# Patient Record
Sex: Female | Born: 1960
Health system: Southern US, Community
[De-identification: ages and names within clinical notes are randomized; demographics above are authoritative.]

## PROBLEM LIST (undated history)

## (undated) DIAGNOSIS — R112 Nausea with vomiting, unspecified: Secondary | ICD-10-CM

## (undated) DIAGNOSIS — G43909 Migraine, unspecified, not intractable, without status migrainosus: Secondary | ICD-10-CM

## (undated) DIAGNOSIS — N200 Calculus of kidney: Secondary | ICD-10-CM

## (undated) DIAGNOSIS — I10 Essential (primary) hypertension: Secondary | ICD-10-CM

## (undated) DIAGNOSIS — M797 Fibromyalgia: Secondary | ICD-10-CM

## (undated) DIAGNOSIS — K449 Diaphragmatic hernia without obstruction or gangrene: Secondary | ICD-10-CM

## (undated) DIAGNOSIS — E559 Vitamin D deficiency, unspecified: Secondary | ICD-10-CM

## (undated) DIAGNOSIS — M1990 Unspecified osteoarthritis, unspecified site: Secondary | ICD-10-CM

## (undated) DIAGNOSIS — K219 Gastro-esophageal reflux disease without esophagitis: Secondary | ICD-10-CM

## (undated) DIAGNOSIS — S0300XA Dislocation of jaw, unspecified side, initial encounter: Secondary | ICD-10-CM

## (undated) DIAGNOSIS — G610 Guillain-Barre syndrome: Secondary | ICD-10-CM

## (undated) DIAGNOSIS — Z87442 Personal history of urinary calculi: Secondary | ICD-10-CM

## (undated) DIAGNOSIS — E785 Hyperlipidemia, unspecified: Secondary | ICD-10-CM

## (undated) DIAGNOSIS — J189 Pneumonia, unspecified organism: Secondary | ICD-10-CM

## (undated) DIAGNOSIS — Z8489 Family history of other specified conditions: Secondary | ICD-10-CM

## (undated) DIAGNOSIS — Z9889 Other specified postprocedural states: Secondary | ICD-10-CM

## (undated) DIAGNOSIS — N390 Urinary tract infection, site not specified: Secondary | ICD-10-CM

## (undated) HISTORY — PX: OTHER SURGICAL HISTORY: SHX169

## (undated) HISTORY — DX: Calculus of kidney: N20.0

## (undated) HISTORY — DX: Fibromyalgia: M79.7

## (undated) HISTORY — PX: KNEE ARTHROSCOPY: SUR90

## (undated) HISTORY — DX: Hyperlipidemia, unspecified: E78.5

## (undated) HISTORY — PX: BACK SURGERY: SHX140

## (undated) HISTORY — DX: Vitamin D deficiency, unspecified: E55.9

## (undated) HISTORY — DX: Gastro-esophageal reflux disease without esophagitis: K21.9

## (undated) HISTORY — PX: EYE SURGERY: SHX253

## (undated) HISTORY — PX: ABDOMINAL HYSTERECTOMY: SHX81

## (undated) HISTORY — DX: Guillain-Barre syndrome: G61.0

## (undated) HISTORY — DX: Diaphragmatic hernia without obstruction or gangrene: K44.9

## (undated) HISTORY — DX: Migraine, unspecified, not intractable, without status migrainosus: G43.909

## (undated) HISTORY — DX: Dislocation of jaw, unspecified side, initial encounter: S03.00XA

---

## 1998-12-01 ENCOUNTER — Encounter (INDEPENDENT_AMBULATORY_CARE_PROVIDER_SITE_OTHER): Payer: Self-pay | Admitting: Specialist

## 1998-12-01 ENCOUNTER — Other Ambulatory Visit: Admission: RE | Admit: 1998-12-01 | Discharge: 1998-12-01 | Payer: Self-pay | Admitting: Otolaryngology

## 2000-11-14 ENCOUNTER — Encounter: Payer: Self-pay | Admitting: Family Medicine

## 2000-11-14 ENCOUNTER — Encounter: Admission: RE | Admit: 2000-11-14 | Discharge: 2000-11-14 | Payer: Self-pay | Admitting: Family Medicine

## 2002-01-06 ENCOUNTER — Ambulatory Visit (HOSPITAL_COMMUNITY): Admission: RE | Admit: 2002-01-06 | Discharge: 2002-01-06 | Payer: Self-pay | Admitting: Internal Medicine

## 2003-10-11 ENCOUNTER — Encounter: Admission: RE | Admit: 2003-10-11 | Discharge: 2003-10-11 | Payer: Self-pay | Admitting: Family Medicine

## 2004-09-07 ENCOUNTER — Other Ambulatory Visit: Admission: RE | Admit: 2004-09-07 | Discharge: 2004-09-07 | Payer: Self-pay | Admitting: Family Medicine

## 2005-05-06 ENCOUNTER — Ambulatory Visit (HOSPITAL_COMMUNITY): Admission: RE | Admit: 2005-05-06 | Discharge: 2005-05-06 | Payer: Self-pay | Admitting: Family Medicine

## 2006-07-10 ENCOUNTER — Ambulatory Visit (HOSPITAL_COMMUNITY): Admission: RE | Admit: 2006-07-10 | Discharge: 2006-07-10 | Payer: Self-pay | Admitting: Family Medicine

## 2006-08-05 ENCOUNTER — Ambulatory Visit: Payer: Self-pay | Admitting: Gastroenterology

## 2006-08-05 LAB — CONVERTED CEMR LAB
Alkaline Phosphatase: 91 units/L (ref 39–117)
BUN: 6 mg/dL (ref 6–23)
Basophils Relative: 0 % (ref 0.0–1.0)
Bilirubin, Direct: 0.1 mg/dL (ref 0.0–0.3)
CO2: 32 meq/L (ref 19–32)
GFR calc Af Amer: 115 mL/min
Glucose, Bld: 83 mg/dL (ref 70–99)
Hemoglobin: 14.2 g/dL (ref 12.0–15.0)
Lymphocytes Relative: 26.5 % (ref 12.0–46.0)
Monocytes Absolute: 0.3 10*3/uL (ref 0.2–0.7)
Monocytes Relative: 4.1 % (ref 3.0–11.0)
Neutro Abs: 4.8 10*3/uL (ref 1.4–7.7)
Potassium: 3.7 meq/L (ref 3.5–5.1)
TSH: 2.09 microintl units/mL (ref 0.35–5.50)
Total Protein: 6.8 g/dL (ref 6.0–8.3)

## 2006-08-19 ENCOUNTER — Ambulatory Visit: Payer: Self-pay | Admitting: Gastroenterology

## 2006-10-16 ENCOUNTER — Other Ambulatory Visit: Admission: RE | Admit: 2006-10-16 | Discharge: 2006-10-16 | Payer: Self-pay | Admitting: Family Medicine

## 2010-07-01 ENCOUNTER — Encounter: Payer: Self-pay | Admitting: Family Medicine

## 2010-08-13 ENCOUNTER — Emergency Department (HOSPITAL_COMMUNITY)
Admission: EM | Admit: 2010-08-13 | Discharge: 2010-08-13 | Disposition: A | Discharge status: Home / Self Care | Payer: Managed Care, Other (non HMO) | Attending: Emergency Medicine | Admitting: Emergency Medicine

## 2010-08-13 DIAGNOSIS — T783XXA Angioneurotic edema, initial encounter: Secondary | ICD-10-CM | POA: Insufficient documentation

## 2010-08-13 DIAGNOSIS — R0602 Shortness of breath: Secondary | ICD-10-CM | POA: Insufficient documentation

## 2010-08-13 DIAGNOSIS — R079 Chest pain, unspecified: Secondary | ICD-10-CM | POA: Insufficient documentation

## 2010-08-13 DIAGNOSIS — R220 Localized swelling, mass and lump, head: Secondary | ICD-10-CM | POA: Insufficient documentation

## 2010-08-13 DIAGNOSIS — L509 Urticaria, unspecified: Secondary | ICD-10-CM | POA: Insufficient documentation

## 2010-08-13 LAB — POCT CARDIAC MARKERS
Myoglobin, poc: 81 ng/mL (ref 12–200)
Troponin i, poc: <0.05 ng/mL (ref 0.00–0.09)

## 2010-08-13 LAB — DIFFERENTIAL
Basophils Absolute: 0 10*3/uL (ref 0.0–0.1)
Basophils Relative: 0 % (ref 0–1)
Neutro Abs: 18.8 10*3/uL — ABNORMAL HIGH (ref 1.7–7.7)
Neutrophils Relative %: 86 % — ABNORMAL HIGH (ref 43–77)

## 2010-08-13 LAB — BASIC METABOLIC PANEL
Calcium: 8.4 mg/dL (ref 8.4–10.5)
Creatinine, Ser: 0.83 mg/dL (ref 0.4–1.2)
GFR calc Af Amer: >60 mL/min (ref >60)

## 2010-08-13 LAB — CBC
Hemoglobin: 13.9 g/dL (ref 12.0–15.0)
MCHC: 33.7 g/dL (ref 30.0–36.0)
RBC: 4.45 MIL/uL (ref 3.87–5.11)

## 2010-10-26 NOTE — Assessment & Plan Note (Signed)
[facility]                         GASTROENTEROLOGY OFFICE NOTE   NAME:CARDWELL, MARCHETA HORSEY                     MRN:          [id number]  DATE:08/05/2006                            DOB:          1961/06/03    REFERRING PHYSICIAN:  Ernestina Penna, M.D.   REASON FOR REFERRAL:  Chest pain and epigastric pain.   HISTORY OF PRESENT ILLNESS:  [name] is a 50 year old white  female, referred through the courtesy of Dr. Vernon Prey.  She relates a  two-month history of worsening problems with nausea and pain under her  left breast, radiating to her back, between her shoulder blades.  She  has problems with heartburn intermittently.  She notes frequent  belching.  She also has gas, bloating and alternating diarrhea with  normal bowel movements.  She had had about a 15-20 pound increase in  weight over the past few months.  She has recently taken Prilosec for  several days with no help and has been on Prevacid for the past three to  four weeks with no change in symptoms.  An ultrasound of the gallbladder  from June 30, 2006, at [facility], showed mild diffuse  hepatomegaly and a left upper pole renal cyst.  No other abnormalities  were noted.  A nuclear medicine hepatobiliary scan with ejection  fraction from July 10, 2006, at [facility], showed a normal  ejection fraction at 43%.  She has had irritable bowel syndrome  diagnosed in the past.  Her main symptoms of lower chest pain that  radiates to her mid-back has been associated with shortness of breath on  several occasions.   PAST MEDICAL HISTORY:  Fibromyalgia.  Allergies.  Status post partial  hysterectomy in 1991, status post completion of her hysterectomy in  1992.   MEDICATIONS:  Listed on the chart, updated and reviewed.   MEDICATION ALLERGIES:  None known.   SOCIAL HISTORY:  Per the handwritten form.   REVIEW OF SYSTEMS:  Per the handwritten form.   PHYSICAL EXAM:   Overweight, white female, in no acute distress.  Height  5 feet 9 inches, weight 172 pounds, blood pressure is 100/66, pulse 88  and regular.  HEENT EXAM:  Anicteric sclerae.  Oropharynx clear.  NECK:  Without thyromegaly or adenopathy appreciated.  CHEST:  Wall tenderness along the lower anterior chest wall.  CARDIAC:  Regular rate and rhythm without murmurs appreciated.  ABDOMEN:  Soft with minimal epigastric tenderness to deep palpation.  No  rebound or guarding.  No palpable organomegaly, masses or hernias.  Normoactive bowel sounds.  EXTREMITIES:  Without clubbing, cyanosis or edema.  NEUROLOGIC:  Alert and oriented times three.  Grossly nonfocal.   ASSESSMENT AND PLAN:  1. Chest pain and epigastric pain.  Her symptoms have features      consistent with a musculoskeletal etiology.  R/O Cardiopulmonary      etiologies, given the dyspnea, defer this to Dr. Christell Constant. R/O GERD or      gastritis.  We will change to Nexium 40 mg p.o. q.a.m. and      discontinue Prevacid.  Begin standard anti-reflux measures.  Risks,      benefits, and alternatives to upper endoscopy with possible biopsy      discussed with the patient and she consents to proceed.  2. Hepatomegaly noted on ultrasound.  We will obtain CBC, basic      metabolic panel, hepatic function panel, lipase and TSH today.      Ultrasound imaging may not always correctly assess the liver size      and further evaluation with a CT scan of the abdomen may be      necessary.  3. Presumed irritable bowel syndrome.  Standard instructions on      irritable bowel syndrome and literature supplied to the patient.      Begin Robinul Forte one b.i.d.     Judie Petit T. Russella Dar, MD, [facility]  Electronically Signed    MTS/MedQ  DD: 08/08/2006  DT: 08/08/2006  Job #: [id number]   cc:   Ernestina Penna, M.D.

## 2010-11-29 ENCOUNTER — Emergency Department (HOSPITAL_COMMUNITY)
Admission: EM | Admit: 2010-11-29 | Discharge: 2010-11-29 | Disposition: A | Discharge status: Home / Self Care | Payer: Managed Care, Other (non HMO) | Attending: Emergency Medicine | Admitting: Emergency Medicine

## 2010-11-29 DIAGNOSIS — R220 Localized swelling, mass and lump, head: Secondary | ICD-10-CM | POA: Insufficient documentation

## 2010-11-29 DIAGNOSIS — R21 Rash and other nonspecific skin eruption: Secondary | ICD-10-CM | POA: Insufficient documentation

## 2010-11-29 DIAGNOSIS — I10 Essential (primary) hypertension: Secondary | ICD-10-CM | POA: Insufficient documentation

## 2010-11-29 DIAGNOSIS — L299 Pruritus, unspecified: Secondary | ICD-10-CM | POA: Insufficient documentation

## 2012-09-04 ENCOUNTER — Telehealth: Payer: Self-pay | Admitting: Nurse Practitioner

## 2012-09-04 NOTE — Telephone Encounter (Signed)
Please advise 

## 2012-09-10 ENCOUNTER — Ambulatory Visit (INDEPENDENT_AMBULATORY_CARE_PROVIDER_SITE_OTHER): Payer: Managed Care, Other (non HMO) | Admitting: Nurse Practitioner

## 2012-09-10 ENCOUNTER — Encounter: Payer: Self-pay | Admitting: Nurse Practitioner

## 2012-09-10 VITALS — BP 117/70 | HR 88 | Temp 98.5°F | Wt 151.0 lb

## 2012-09-10 DIAGNOSIS — IMO0001 Reserved for inherently not codable concepts without codable children: Secondary | ICD-10-CM

## 2012-09-10 DIAGNOSIS — M797 Fibromyalgia: Secondary | ICD-10-CM

## 2012-09-10 NOTE — Progress Notes (Signed)
PATIENTS APPT WAS AT 3:30 and i was rooming the patient and she stated she was unable to wait any longer that she had to be at work at 4:00. Patient left and was not seen by provider.

## 2012-09-11 NOTE — Addendum Note (Signed)
Addended by: Tamera Punt on: 09/11/2012 02:37 PM   Modules accepted: Kipp Brood

## 2012-09-11 NOTE — Progress Notes (Signed)
This encounter was created in error - please disregard.

## 2012-09-16 ENCOUNTER — Other Ambulatory Visit: Payer: Self-pay | Admitting: *Deleted

## 2012-09-16 MED ORDER — HYDROCODONE-ACETAMINOPHEN 5-325 MG PO TABS: 1.0000 | ORAL_TABLET | Freq: Three times a day (TID) | ORAL | Status: DC | PRN

## 2012-09-16 NOTE — Telephone Encounter (Signed)
Pharmacy requesting dose change from 5-500 to 5-325 due to original dose not being made anymore. Need new rx. Please advise. If approved print out and have pt pick up. Thank you

## 2012-11-04 ENCOUNTER — Encounter: Payer: Self-pay | Admitting: Nurse Practitioner

## 2012-11-04 ENCOUNTER — Ambulatory Visit (INDEPENDENT_AMBULATORY_CARE_PROVIDER_SITE_OTHER): Payer: Managed Care, Other (non HMO) | Admitting: Nurse Practitioner

## 2012-11-04 VITALS — BP 105/70 | HR 96 | Temp 97.7°F | Ht 68.5 in | Wt 146.0 lb

## 2012-11-04 DIAGNOSIS — IMO0001 Reserved for inherently not codable concepts without codable children: Secondary | ICD-10-CM

## 2012-11-04 DIAGNOSIS — IMO0002 Reserved for concepts with insufficient information to code with codable children: Secondary | ICD-10-CM

## 2012-11-04 DIAGNOSIS — M797 Fibromyalgia: Secondary | ICD-10-CM

## 2012-11-04 DIAGNOSIS — L500 Allergic urticaria: Secondary | ICD-10-CM

## 2012-11-04 DIAGNOSIS — R5383 Other fatigue: Secondary | ICD-10-CM

## 2012-11-04 DIAGNOSIS — G43909 Migraine, unspecified, not intractable, without status migrainosus: Secondary | ICD-10-CM

## 2012-11-04 DIAGNOSIS — K219 Gastro-esophageal reflux disease without esophagitis: Secondary | ICD-10-CM | POA: Insufficient documentation

## 2012-11-04 DIAGNOSIS — E785 Hyperlipidemia, unspecified: Secondary | ICD-10-CM | POA: Insufficient documentation

## 2012-11-04 DIAGNOSIS — G4700 Insomnia, unspecified: Secondary | ICD-10-CM

## 2012-11-04 DIAGNOSIS — S0300XS Dislocation of jaw, unspecified side, sequela: Secondary | ICD-10-CM

## 2012-11-04 LAB — COMPLETE METABOLIC PANEL WITH GFR
Alkaline Phosphatase: 73 U/L (ref 39–117)
Creat: 0.67 mg/dL (ref 0.50–1.10)
GFR, Est Non African American: >89 mL/min
Glucose, Bld: 91 mg/dL (ref 70–99)
Sodium: 140 mEq/L (ref 135–145)
Total Bilirubin: 0.3 mg/dL (ref 0.3–1.2)
Total Protein: 6.9 g/dL (ref 6.0–8.3)

## 2012-11-04 LAB — VITAMIN B12: Vitamin B-12: 1190 pg/mL — ABNORMAL HIGH (ref 211–911)

## 2012-11-04 MED ORDER — CLEMASTINE FUMARATE 1.34 MG PO TABS: 1.0000 | ORAL_TABLET | Freq: Two times a day (BID) | ORAL | Status: DC

## 2012-11-04 MED ORDER — ZOLPIDEM TARTRATE 10 MG PO TABS: 10.0000 mg | ORAL_TABLET | Freq: Every evening | ORAL | Status: DC | PRN

## 2012-11-04 NOTE — Patient Instructions (Signed)

## 2012-11-04 NOTE — Progress Notes (Signed)
  Subjective:    Patient ID: [name], female    DOB: July 31, 1960, 52 y.o.   MRN: [id number]  Hyperlipidemia This is a chronic problem. The current episode started more than 1 year ago. The problem is controlled. Recent lipid tests were reviewed and are variable. There are no known factors aggravating her hyperlipidemia. Associated symptoms include leg pain and myalgias. Pertinent negatives include no focal sensory loss. Current antihyperlipidemic treatment includes statins. The current treatment provides moderate improvement of lipids. Compliance problems include adherence to diet.   Fibromylagia Patient currently on savella and norco- doing ok- Has lost weight which has helped with some of her pain. Insomnia ambien working well feels rested in AM.    Review of Systems  Constitutional: Positive for fatigue.  Respiratory: Negative.   Cardiovascular: Negative.   Gastrointestinal: Negative.   Genitourinary: Negative.   Musculoskeletal: Positive for myalgias.  All other systems reviewed and are negative.       Objective:   Physical Exam  Constitutional: She is oriented to person, place, and time. She appears well-developed and well-nourished.  HENT:  Nose: Nose normal.  Mouth/Throat: Oropharynx is clear and moist.  Eyes: EOM are normal.  Neck: Trachea normal, normal range of motion and full passive range of motion without pain. Neck supple. No JVD present. Carotid bruit is not present. No thyromegaly present.  Cardiovascular: Normal rate, regular rhythm, normal heart sounds and intact distal pulses.  Exam reveals no gallop and no friction rub.   No murmur heard. Pulmonary/Chest: Effort normal and breath sounds normal.  Abdominal: Soft. Bowel sounds are normal. She exhibits no distension and no mass. There is no tenderness.  Musculoskeletal: Normal range of motion.  Lymphadenopathy:    She has no cervical adenopathy.  Neurological: She is alert and oriented to person, place,  and time. She has normal reflexes.  Skin: Skin is warm and dry.  Psychiatric: She has a normal mood and affect. Her behavior is normal. Judgment and thought content normal.    BP 105/70  Pulse 96  Temp(Src) 97.7 F (36.5 C) (Oral)  Ht 5' 8.5" (1.74 m)  Wt 146 lb (66.225 kg)  BMI 21.87 kg/m2       Assessment & Plan:  1. Hyperlipidemia Low fat diet and exercise - COMPLETE METABOLIC PANEL WITH GFR - NMR Lipoprofile with Lipids  2. Migraines Rest in dork place when occur  3. GERD (gastroesophageal reflux disease) Avoid spicy and fatty food  4. Fibromyalgia Continue savella as rx as well as pain meds Ok to take 1 tylenol with pain meds  5. TMJ (dislocation of temporomandibular joint), sequela   6. Fatigue Labs pending - Vitamin B12  7. Insomnia Bedtime ritua - zolpidem (AMBIEN) 10 MG tablet; Take 1 tablet (10 mg total) by mouth at bedtime as needed for sleep.  Dispense: 90 tablet; Refill: 0  8. Allergic urticaria Avoid allergens - Clemastine Fumarate 1.34 MG TABS; Take 1 tablet (1.34 mg total) by mouth 2 (two) times daily.  Dispense: 180 each; Refill: 1   Mary-Margaret Daphine Deutscher, FNP

## 2012-11-06 LAB — NMR LIPOPROFILE WITH LIPIDS
HDL Size: 8.8 nm — ABNORMAL LOW (ref >=9.2)
HDL-C: 40 mg/dL (ref >=40)
LDL (calc): 111 mg/dL — ABNORMAL HIGH (ref <100)
LDL Particle Number: 2045 nmol/L — ABNORMAL HIGH (ref <1000)
LP-IR Score: 62 — ABNORMAL HIGH (ref <=45)
Triglycerides: 188 mg/dL — ABNORMAL HIGH (ref <150)
VLDL Size: 49.3 nm — ABNORMAL HIGH (ref <=46.6)

## 2012-11-09 NOTE — Progress Notes (Signed)
Pt will try another med. She has tried several that were bad for her fibromyalgia. She doesn't remember the names but wants you to be sure this is a new one.

## 2012-11-09 NOTE — Progress Notes (Signed)
Pull chart so I can see what she has tried in past

## 2012-11-10 ENCOUNTER — Other Ambulatory Visit: Payer: Self-pay | Admitting: Nurse Practitioner

## 2012-11-10 MED ORDER — PRAVASTATIN SODIUM 40 MG PO TABS: 40.0000 mg | ORAL_TABLET | Freq: Every day | ORAL | Status: DC

## 2012-11-30 ENCOUNTER — Telehealth: Payer: Self-pay | Admitting: Nurse Practitioner

## 2012-12-02 NOTE — Telephone Encounter (Signed)
Appt given in the morning per pt request

## 2012-12-03 ENCOUNTER — Ambulatory Visit (INDEPENDENT_AMBULATORY_CARE_PROVIDER_SITE_OTHER): Payer: Managed Care, Other (non HMO) | Admitting: Nurse Practitioner

## 2012-12-03 ENCOUNTER — Encounter: Payer: Self-pay | Admitting: Nurse Practitioner

## 2012-12-03 VITALS — BP 102/75 | HR 94 | Temp 97.1°F | Ht 68.5 in | Wt 142.0 lb

## 2012-12-03 DIAGNOSIS — IMO0001 Reserved for inherently not codable concepts without codable children: Secondary | ICD-10-CM

## 2012-12-03 DIAGNOSIS — M797 Fibromyalgia: Secondary | ICD-10-CM

## 2012-12-03 MED ORDER — KETOROLAC TROMETHAMINE 60 MG/2ML IM SOLN
60.0000 mg | Freq: Once | INTRAMUSCULAR | Status: AC
  Administered 2012-12-03: 60 mg via INTRAMUSCULAR

## 2012-12-03 MED ORDER — HYDROCODONE-ACETAMINOPHEN 5-325 MG PO TABS: 1.0000 | ORAL_TABLET | Freq: Three times a day (TID) | ORAL | Status: DC | PRN

## 2012-12-03 NOTE — Patient Instructions (Addendum)

## 2012-12-03 NOTE — Progress Notes (Signed)
  Subjective:    Patient ID: [name], female    DOB: 1961/02/26, 52 y.o.   MRN: [id number]  HPI Pt here for pain control for fibromyalgia. Pt states the pain is "all over" and it started about a week ago. Describes pain as "someone pulling the meat off my bones" as 10 out of 10. She has tired drinking lots of fluids, exercising, Motrin and Norvo at home with no relief.   She has not been in in several months with these complaints.    Review of Systems  Constitutional: Positive for fatigue.  Eyes: Positive for photophobia.  Gastrointestinal: Positive for nausea.  Neurological: Positive for headaches.  Psychiatric/Behavioral: Positive for agitation.       Objective:   Physical Exam  Constitutional: She is oriented to person, place, and time. She appears well-developed and well-nourished.  Cardiovascular: Normal rate, regular rhythm and normal heart sounds.   Pulmonary/Chest: Effort normal and breath sounds normal.  Musculoskeletal: Normal range of motion.  Neurological: She is alert and oriented to person, place, and time.  Skin: Skin is warm and dry.  Psychiatric: Judgment and thought content normal.  Pt agitated and withdrawn      BP 102/75  Pulse 94  Temp(Src) 97.1 F (36.2 C) (Oral)  Ht 5' 8.5" (1.74 m)  Wt 142 lb (64.411 kg)  BMI 21.27 kg/m2     Assessment & Plan:   1. Fibromyalgia    toradol 60 mg IM now Meds ordered this encounter  Medications  . HYDROcodone-acetaminophen (NORCO/VICODIN) 5-325 MG per tablet    Sig: Take 1 tablet by mouth every 8 (eight) hours as needed for pain.    Dispense:  90 tablet    Refill:  0    Order Specific Question:  Supervising Provider    Answer:  Ernestina Penna [[id number]]  . ketorolac (TORADOL) injection 60 mg    Sig:    Follow up in 1 month  Mary-Margaret Daphine Deutscher, FNP

## 2013-02-09 ENCOUNTER — Other Ambulatory Visit: Payer: Self-pay | Admitting: Nurse Practitioner

## 2013-02-09 ENCOUNTER — Encounter: Payer: Self-pay | Admitting: Nurse Practitioner

## 2013-02-09 DIAGNOSIS — M797 Fibromyalgia: Secondary | ICD-10-CM

## 2013-02-09 MED ORDER — HYDROCODONE-ACETAMINOPHEN 5-325 MG PO TABS: 1.0000 | ORAL_TABLET | Freq: Three times a day (TID) | ORAL | Status: DC | PRN

## 2013-02-10 ENCOUNTER — Telehealth: Payer: Self-pay | Admitting: *Deleted

## 2013-02-10 NOTE — Telephone Encounter (Signed)
Script for vicodin, quantity 90 called to [facility] [place], per MMM. Daughter will notify Ms Elmer Picker.

## 2013-03-15 ENCOUNTER — Telehealth: Payer: Self-pay | Admitting: Nurse Practitioner

## 2013-03-18 ENCOUNTER — Other Ambulatory Visit: Payer: Self-pay | Admitting: *Deleted

## 2013-03-18 MED ORDER — PRAVASTATIN SODIUM 40 MG PO TABS: 40.0000 mg | ORAL_TABLET | Freq: Every day | ORAL | Status: DC

## 2013-03-25 NOTE — Telephone Encounter (Signed)
No return call from patient.

## 2013-04-15 ENCOUNTER — Other Ambulatory Visit: Payer: Self-pay

## 2013-05-03 ENCOUNTER — Telehealth: Payer: Self-pay | Admitting: Nurse Practitioner

## 2013-05-04 ENCOUNTER — Encounter: Payer: Self-pay | Admitting: Nurse Practitioner

## 2013-05-04 ENCOUNTER — Ambulatory Visit (INDEPENDENT_AMBULATORY_CARE_PROVIDER_SITE_OTHER): Payer: Managed Care, Other (non HMO) | Admitting: Nurse Practitioner

## 2013-05-04 VITALS — BP 120/77 | HR 98 | Temp 99.5°F | Ht 68.5 in | Wt 145.0 lb

## 2013-05-04 DIAGNOSIS — M797 Fibromyalgia: Secondary | ICD-10-CM

## 2013-05-04 DIAGNOSIS — G43909 Migraine, unspecified, not intractable, without status migrainosus: Secondary | ICD-10-CM

## 2013-05-04 DIAGNOSIS — IMO0001 Reserved for inherently not codable concepts without codable children: Secondary | ICD-10-CM

## 2013-05-04 DIAGNOSIS — K219 Gastro-esophageal reflux disease without esophagitis: Secondary | ICD-10-CM

## 2013-05-04 DIAGNOSIS — G4700 Insomnia, unspecified: Secondary | ICD-10-CM

## 2013-05-04 DIAGNOSIS — E785 Hyperlipidemia, unspecified: Secondary | ICD-10-CM

## 2013-05-04 DIAGNOSIS — S0300XS Dislocation of jaw, unspecified side, sequela: Secondary | ICD-10-CM

## 2013-05-04 DIAGNOSIS — IMO0002 Reserved for concepts with insufficient information to code with codable children: Secondary | ICD-10-CM

## 2013-05-04 MED ORDER — SUMATRIPTAN SUCCINATE 100 MG PO TABS: 100.0000 mg | ORAL_TABLET | ORAL | Status: DC | PRN

## 2013-05-04 MED ORDER — MILNACIPRAN HCL 50 MG PO TABS: 50.0000 mg | ORAL_TABLET | Freq: Two times a day (BID) | ORAL | Status: DC

## 2013-05-04 MED ORDER — VALACYCLOVIR HCL 1 G PO TABS: 1000.0000 mg | ORAL_TABLET | Freq: Every day | ORAL | Status: DC

## 2013-05-04 MED ORDER — PRAVASTATIN SODIUM 40 MG PO TABS: 40.0000 mg | ORAL_TABLET | Freq: Every day | ORAL | Status: DC

## 2013-05-04 MED ORDER — ZOLPIDEM TARTRATE 10 MG PO TABS: 10.0000 mg | ORAL_TABLET | Freq: Every evening | ORAL | Status: DC | PRN

## 2013-05-04 MED ORDER — KETOROLAC TROMETHAMINE 60 MG/2ML IM SOLN
60.0000 mg | Freq: Once | INTRAMUSCULAR | Status: AC
  Administered 2013-05-04: 60 mg via INTRAMUSCULAR

## 2013-05-04 MED ORDER — HYDROCODONE-ACETAMINOPHEN 5-325 MG PO TABS: 1.0000 | ORAL_TABLET | Freq: Three times a day (TID) | ORAL | Status: DC | PRN

## 2013-05-04 NOTE — Patient Instructions (Signed)

## 2013-05-04 NOTE — Progress Notes (Signed)
Subjective:    Patient ID: [name], female    DOB: 07/19/1960, 52 y.o.   MRN: [id number]  HPI  patien there today for chronic follow up- she has been doing quite well- her pain is manageablke fro her fibromyalgia. Patient Active Problem List   Diagnosis Date Noted  . Hyperlipidemia 11/04/2012  . Migraines 11/04/2012  . GERD (gastroesophageal reflux disease) 11/04/2012  . Fibromyalgia 11/04/2012  . TMJ (dislocation of temporomandibular joint) 11/04/2012   Outpatient Encounter Prescriptions as of 05/04/2013  Medication Sig  . Clemastine Fumarate 1.34 MG TABS Take 1 tablet (1.34 mg total) by mouth 2 (two) times daily.  Marland Kitchen EPINEPHrine (EPI-PEN) 0.3 mg/0.3 mL DEVI Inject into the muscle once.  . famotidine (PEPCID) 20 MG tablet Take 20 mg by mouth 2 (two) times daily.  Marland Kitchen HYDROcodone-acetaminophen (NORCO/VICODIN) 5-325 MG per tablet Take 1 tablet by mouth every 8 (eight) hours as needed for pain.  Marland Kitchen loratadine (CLARITIN) 10 MG tablet Take 10 mg by mouth daily.  . Milnacipran (SAVELLA) 50 MG TABS Take 50 mg by mouth 2 (two) times daily.  . Multiple Vitamins-Minerals (MULTIVITAMIN PO) Take 1 tablet by mouth.  . pravastatin (PRAVACHOL) 40 MG tablet Take 1 tablet (40 mg total) by mouth daily.  . SUMAtriptan (IMITREX) 100 MG tablet Take 100 mg by mouth every 2 (two) hours as needed for migraine.  . valACYclovir (VALTREX) 1000 MG tablet Take 1,000 mg by mouth daily.  Marland Kitchen zolpidem (AMBIEN) 10 MG tablet Take 1 tablet (10 mg total) by mouth at bedtime as needed for sleep.       Review of Systems  Constitutional: Negative.   HENT: Negative.   Eyes: Negative.   Respiratory: Negative.   Cardiovascular: Negative.   Gastrointestinal: Negative.   Endocrine: Negative.   Genitourinary: Negative.   Musculoskeletal: Positive for back pain and myalgias.  Neurological: Negative.   Hematological: Negative.   Psychiatric/Behavioral: Negative.        Objective:   Physical Exam   Constitutional: She is oriented to person, place, and time. She appears well-developed and well-nourished.  HENT:  Nose: Nose normal.  Mouth/Throat: Oropharynx is clear and moist.  Eyes: EOM are normal.  Neck: Trachea normal, normal range of motion and full passive range of motion without pain. Neck supple. No JVD present. Carotid bruit is not present. No thyromegaly present.  Cardiovascular: Normal rate, regular rhythm, normal heart sounds and intact distal pulses.  Exam reveals no gallop and no friction rub.   No murmur heard. Pulmonary/Chest: Effort normal and breath sounds normal.  Abdominal: Soft. Bowel sounds are normal. She exhibits no distension and no mass. There is no tenderness.  Musculoskeletal: Normal range of motion.  Multiple point tenderness up and down back (+) phalen on right hand (+) tinel right hand   Lymphadenopathy:    She has no cervical adenopathy.  Neurological: She is alert and oriented to person, place, and time. She has normal reflexes.  Skin: Skin is warm and dry.  Psychiatric: She has a normal mood and affect. Her behavior is normal. Judgment and thought content normal.     BP 120/77  Pulse 98  Temp(Src) 99.5 F (37.5 C) (Oral)  Ht 5' 8.5" (1.74 m)  Wt 145 lb (65.772 kg)  BMI 21.72 kg/m2      Assessment & Plan:   1. Migraines   2. Hyperlipidemia   3. GERD (gastroesophageal reflux disease)   4. Fibromyalgia   5. TMJ (dislocation of temporomandibular joint), sequela  6. Insomnia     Meds ordered this encounter  Medications  . ketorolac (TORADOL) injection 60 mg    Sig:   . SUMAtriptan (IMITREX) 100 MG tablet    Sig: Take 1 tablet (100 mg total) by mouth every 2 (two) hours as needed for migraine.    Dispense:  10 tablet    Refill:  5    Order Specific Question:  Supervising Provider    Answer:  Ernestina Penna [[id number]]  . pravastatin (PRAVACHOL) 40 MG tablet    Sig: Take 1 tablet (40 mg total) by mouth daily.    Dispense:  30 tablet     Refill:  5    Order Specific Question:  Supervising Provider    Answer:  Ernestina Penna [[id number]]  . zolpidem (AMBIEN) 10 MG tablet    Sig: Take 1 tablet (10 mg total) by mouth at bedtime as needed for sleep.    Dispense:  90 tablet    Refill:  1    Order Specific Question:  Supervising Provider    Answer:  Ernestina Penna [[id number]]  . HYDROcodone-acetaminophen (NORCO/VICODIN) 5-325 MG per tablet    Sig: Take 1 tablet by mouth every 8 (eight) hours as needed.    Dispense:  90 tablet    Refill:  0    Order Specific Question:  Supervising Provider    Answer:  Ernestina Penna [[id number]]  . Milnacipran (SAVELLA) 50 MG TABS tablet    Sig: Take 1 tablet (50 mg total) by mouth 2 (two) times daily.    Dispense:  60 tablet    Refill:  5    Order Specific Question:  Supervising Provider    Answer:  Ernestina Penna [[id number]]  . valACYclovir (VALTREX) 1000 MG tablet    Sig: Take 1 tablet (1,000 mg total) by mouth daily.    Dispense:  30 tablet    Refill:  5    Order Specific Question:  Supervising Provider    Answer:  Deborra Medina    Continue all meds Labs pending Diet and exercise encouraged Health maintenance reviewed Follow up in 1 month cpe  Mary-Margaret Daphine Deutscher, FNP

## 2013-05-14 ENCOUNTER — Ambulatory Visit (INDEPENDENT_AMBULATORY_CARE_PROVIDER_SITE_OTHER): Payer: Managed Care, Other (non HMO) | Admitting: Nurse Practitioner

## 2013-05-14 ENCOUNTER — Encounter: Payer: Self-pay | Admitting: Nurse Practitioner

## 2013-05-14 VITALS — BP 120/77 | HR 86 | Temp 97.5°F | Ht 68.5 in | Wt 145.0 lb

## 2013-05-14 DIAGNOSIS — Z01419 Encounter for gynecological examination (general) (routine) without abnormal findings: Secondary | ICD-10-CM

## 2013-05-14 DIAGNOSIS — Z124 Encounter for screening for malignant neoplasm of cervix: Secondary | ICD-10-CM

## 2013-05-14 DIAGNOSIS — I10 Essential (primary) hypertension: Secondary | ICD-10-CM

## 2013-05-14 DIAGNOSIS — G4700 Insomnia, unspecified: Secondary | ICD-10-CM

## 2013-05-14 DIAGNOSIS — E785 Hyperlipidemia, unspecified: Secondary | ICD-10-CM

## 2013-05-14 DIAGNOSIS — Z0000 Encounter for general adult medical examination without abnormal findings: Secondary | ICD-10-CM

## 2013-05-14 MED ORDER — PRAVASTATIN SODIUM 40 MG PO TABS: 40.0000 mg | ORAL_TABLET | Freq: Every day | ORAL | Status: DC

## 2013-05-14 MED ORDER — SUMATRIPTAN SUCCINATE 100 MG PO TABS: 100.0000 mg | ORAL_TABLET | ORAL | Status: DC | PRN

## 2013-05-14 MED ORDER — VALACYCLOVIR HCL 1 G PO TABS: 1000.0000 mg | ORAL_TABLET | Freq: Every day | ORAL | Status: DC

## 2013-05-14 MED ORDER — MILNACIPRAN HCL 50 MG PO TABS: 50.0000 mg | ORAL_TABLET | Freq: Two times a day (BID) | ORAL | Status: DC

## 2013-05-14 MED ORDER — ZOLPIDEM TARTRATE 10 MG PO TABS: 10.0000 mg | ORAL_TABLET | Freq: Every evening | ORAL | Status: DC | PRN

## 2013-05-14 NOTE — Patient Instructions (Signed)

## 2013-05-14 NOTE — Addendum Note (Signed)
Addended by: Orma Render F on: 05/14/2013 01:35 PM   Modules accepted: Orders

## 2013-05-14 NOTE — Progress Notes (Signed)
Subjective:    Patient ID: [name], female    DOB: 11-07-1960, 52 y.o.   MRN: [id number]  HPI Patient in today for PA exam- she was seen for follow up last week of her chronic medical problems. Patient Active Problem List   Diagnosis Date Noted  . Hyperlipidemia 11/04/2012  . Migraines 11/04/2012  . GERD (gastroesophageal reflux disease) 11/04/2012  . Fibromyalgia 11/04/2012  . TMJ (dislocation of temporomandibular joint) 11/04/2012   Outpatient Encounter Prescriptions as of 05/14/2013  Medication Sig  . Clemastine Fumarate 1.34 MG TABS Take 1 tablet (1.34 mg total) by mouth 2 (two) times daily.  Marland Kitchen EPINEPHrine (EPI-PEN) 0.3 mg/0.3 mL DEVI Inject into the muscle once.  . famotidine (PEPCID) 20 MG tablet Take 20 mg by mouth 2 (two) times daily.  Marland Kitchen HYDROcodone-acetaminophen (NORCO/VICODIN) 5-325 MG per tablet Take 1 tablet by mouth every 8 (eight) hours as needed.  . loratadine (CLARITIN) 10 MG tablet Take 10 mg by mouth daily.  . Milnacipran (SAVELLA) 50 MG TABS tablet Take 1 tablet (50 mg total) by mouth 2 (two) times daily.  . Multiple Vitamins-Minerals (MULTIVITAMIN PO) Take 1 tablet by mouth.  . pravastatin (PRAVACHOL) 40 MG tablet Take 1 tablet (40 mg total) by mouth daily.  . SUMAtriptan (IMITREX) 100 MG tablet Take 1 tablet (100 mg total) by mouth every 2 (two) hours as needed for migraine.  . valACYclovir (VALTREX) 1000 MG tablet Take 1 tablet (1,000 mg total) by mouth daily.  Marland Kitchen zolpidem (AMBIEN) 10 MG tablet Take 1 tablet (10 mg total) by mouth at bedtime as needed for sleep.      Review of Systems  Constitutional: Negative.   HENT: Negative.   Respiratory: Negative.   Cardiovascular: Negative.   Gastrointestinal: Negative.   Genitourinary: Negative.   Musculoskeletal: Negative.        Objective:   Physical Exam  Constitutional: She is oriented to person, place, and time. She appears well-developed and well-nourished.  HENT:  Head: Normocephalic.  Right  Ear: Hearing, tympanic membrane, external ear and ear canal normal.  Left Ear: Hearing, tympanic membrane, external ear and ear canal normal.  Nose: Nose normal.  Mouth/Throat: Uvula is midline and oropharynx is clear and moist.  Eyes: Conjunctivae and EOM are normal. Pupils are equal, round, and reactive to light.  Neck: Normal range of motion and full passive range of motion without pain. Neck supple. No JVD present. Carotid bruit is not present. No mass and no thyromegaly present.  Cardiovascular: Normal rate, normal heart sounds and intact distal pulses.   No murmur heard. Pulmonary/Chest: Effort normal and breath sounds normal. Right breast exhibits no inverted nipple, no mass, no nipple discharge, no skin change and no tenderness. Left breast exhibits no inverted nipple, no mass, no nipple discharge, no skin change and no tenderness.  Abdominal: Soft. Bowel sounds are normal. She exhibits no mass. There is no tenderness.  Genitourinary: Vagina normal and uterus normal. No breast swelling, tenderness, discharge or bleeding.  bimanual exam-No adnexal masses or tenderness. Vaginal cuff intact  Musculoskeletal: Normal range of motion.  Multiple tender points up and down back  Lymphadenopathy:    She has no cervical adenopathy.  Neurological: She is alert and oriented to person, place, and time.  Skin: Skin is warm and dry.  Psychiatric: She has a normal mood and affect. Her behavior is normal. Judgment and thought content normal.     BP 120/77  Pulse 86  Temp(Src) 97.5 F (36.4  C) (Oral)  Ht 5' 8.5" (1.74 m)  Wt 145 lb (65.772 kg)  BMI 21.72 kg/m2      Assessment & Plan:   1. Other and unspecified hyperlipidemia   2. Unspecified essential hypertension   3. Insomnia   4. Annual physical exam   5. Encounter for routine gynecological examination    Orders Placed This Encounter  Procedures  . CMP14+EGFR  . NMR, lipoprofile  . CBC With differential/Platelet  . Thyroid Panel  With TSH   Meds ordered this encounter  Medications  . Milnacipran (SAVELLA) 50 MG TABS tablet    Sig: Take 1 tablet (50 mg total) by mouth 2 (two) times daily.    Dispense:  180 tablet    Refill:  3    Order Specific Question:  Supervising Provider    Answer:  Ernestina Penna [[id number]]  . pravastatin (PRAVACHOL) 40 MG tablet    Sig: Take 1 tablet (40 mg total) by mouth daily.    Dispense:  90 tablet    Refill:  3    Order Specific Question:  Supervising Provider    Answer:  Ernestina Penna [[id number]]  . valACYclovir (VALTREX) 1000 MG tablet    Sig: Take 1 tablet (1,000 mg total) by mouth daily.    Dispense:  90 tablet    Refill:  3    Order Specific Question:  Supervising Provider    Answer:  Ernestina Penna [[id number]]  . zolpidem (AMBIEN) 10 MG tablet    Sig: Take 1 tablet (10 mg total) by mouth at bedtime as needed for sleep.    Dispense:  90 tablet    Refill:  3    Order Specific Question:  Supervising Provider    Answer:  Ernestina Penna [[id number]]  . SUMAtriptan (IMITREX) 100 MG tablet    Sig: Take 1 tablet (100 mg total) by mouth every 2 (two) hours as needed for migraine.    Dispense:  30 tablet    Refill:  3    Order Specific Question:  Supervising Provider    Answer:  Deborra Medina    Continue all meds Labs pending Diet and exercise encouraged Health maintenance reviewed Follow up in 6 months  Mary-Margaret Daphine Deutscher, FNP

## 2013-05-16 LAB — CMP14+EGFR
AST: 28 IU/L (ref 0–40)
Albumin: 4.5 g/dL (ref 3.5–5.5)
Alkaline Phosphatase: 62 IU/L (ref 39–117)
BUN/Creatinine Ratio: 16 (ref 9–23)
BUN: 11 mg/dL (ref 6–24)
Calcium: 9.5 mg/dL (ref 8.7–10.2)
Chloride: 103 mmol/L (ref 97–108)
Creatinine, Ser: 0.7 mg/dL (ref 0.57–1.00)
GFR calc Af Amer: 114 mL/min/{1.73_m2} (ref >59)
Potassium: 4.5 mmol/L (ref 3.5–5.2)
Sodium: 144 mmol/L (ref 134–144)
Total Bilirubin: 0.2 mg/dL (ref 0.0–1.2)

## 2013-05-16 LAB — NMR, LIPOPROFILE
Cholesterol: 151 mg/dL (ref <200)
LDL Particle Number: 1639 nmol/L — ABNORMAL HIGH (ref <1000)
LDL Size: 20.1 nm — ABNORMAL LOW (ref >20.5)
LDLC SERPL CALC-MCNC: 75 mg/dL (ref <100)
LP-IR Score: 50 — ABNORMAL HIGH (ref <=45)

## 2013-05-17 LAB — CBC WITH DIFFERENTIAL
Basophils Absolute: 0 10*3/uL (ref 0.0–0.2)
Eosinophils Absolute: 0.1 10*3/uL (ref 0.0–0.4)
Hemoglobin: 14.9 g/dL (ref 11.1–15.9)
Immature Granulocytes: 0 %
Lymphocytes Absolute: 2 10*3/uL (ref 0.7–3.1)
Lymphs: 21 %
MCH: 32.4 pg (ref 26.6–33.0)
MCHC: 34.1 g/dL (ref 31.5–35.7)
Monocytes Absolute: 0.7 10*3/uL (ref 0.1–0.9)
Monocytes: 7 %
Neutrophils Relative %: 71 %
Platelets: 182 10*3/uL (ref 150–379)
RDW: 13 % (ref 12.3–15.4)
WBC: 9.7 10*3/uL (ref 3.4–10.8)

## 2013-05-17 LAB — SPECIMEN STATUS REPORT

## 2013-05-17 LAB — THYROID PANEL WITH TSH
Free Thyroxine Index: 1.7 (ref 1.2–4.9)
T4, Total: 6.2 ug/dL (ref 4.5–12.0)
TSH: 1.69 u[IU]/mL (ref 0.450–4.500)

## 2013-05-19 LAB — PAP IG W/ RFLX HPV ASCU: PAP Smear Comment: 0

## 2013-06-30 ENCOUNTER — Other Ambulatory Visit: Payer: Self-pay | Admitting: Nurse Practitioner

## 2013-06-30 ENCOUNTER — Encounter: Payer: Self-pay | Admitting: Nurse Practitioner

## 2013-06-30 DIAGNOSIS — M797 Fibromyalgia: Secondary | ICD-10-CM

## 2013-06-30 MED ORDER — HYDROCODONE-ACETAMINOPHEN 5-325 MG PO TABS: 1.0000 | ORAL_TABLET | Freq: Three times a day (TID) | ORAL | Status: DC | PRN

## 2013-07-27 ENCOUNTER — Encounter: Payer: Self-pay | Admitting: Nurse Practitioner

## 2013-07-27 ENCOUNTER — Other Ambulatory Visit: Payer: Self-pay | Admitting: Nurse Practitioner

## 2013-07-27 MED ORDER — ALPRAZOLAM 0.25 MG PO TABS: 0.2500 mg | ORAL_TABLET | Freq: Two times a day (BID) | ORAL | Status: DC

## 2013-07-28 NOTE — Progress Notes (Signed)
Called in.

## 2013-10-04 ENCOUNTER — Encounter: Payer: Self-pay | Admitting: Nurse Practitioner

## 2013-10-04 ENCOUNTER — Ambulatory Visit (INDEPENDENT_AMBULATORY_CARE_PROVIDER_SITE_OTHER): Payer: Managed Care, Other (non HMO) | Admitting: Nurse Practitioner

## 2013-10-04 ENCOUNTER — Ambulatory Visit (INDEPENDENT_AMBULATORY_CARE_PROVIDER_SITE_OTHER): Payer: Managed Care, Other (non HMO)

## 2013-10-04 VITALS — BP 103/65 | HR 100 | Temp 98.4°F | Ht 68.0 in | Wt 147.0 lb

## 2013-10-04 DIAGNOSIS — Z0000 Encounter for general adult medical examination without abnormal findings: Secondary | ICD-10-CM

## 2013-10-04 DIAGNOSIS — Z01818 Encounter for other preprocedural examination: Secondary | ICD-10-CM

## 2013-10-04 DIAGNOSIS — M797 Fibromyalgia: Secondary | ICD-10-CM

## 2013-10-04 MED ORDER — HYDROCODONE-ACETAMINOPHEN 5-325 MG PO TABS: 1.0000 | ORAL_TABLET | Freq: Three times a day (TID) | ORAL | Status: DC | PRN

## 2013-10-04 MED ORDER — VARENICLINE TARTRATE 0.5 MG X 11 & 1 MG X 42 PO MISC: ORAL | Status: DC

## 2013-10-04 MED ORDER — VARENICLINE TARTRATE 1 MG PO TABS: 1.0000 mg | ORAL_TABLET | Freq: Two times a day (BID) | ORAL | Status: DC

## 2013-10-04 NOTE — Patient Instructions (Signed)

## 2013-10-04 NOTE — Progress Notes (Signed)
   Subjective:    Patient ID: [name], female    DOB: 01-21-1961, 53 y.o.   MRN: [id number]  HPI {Patient [facility] cervical disc [facility],2015[facility]  Is here today for surgical clearance- SHe has been doing well- no complaints.    Review of Systems  Constitutional: Negative.   HENT: Negative.   Respiratory: Negative.   Cardiovascular: Negative.   Genitourinary: Negative.   Hematological: Negative.   Psychiatric/Behavioral: Negative.   All other systems reviewed and are negative.      Objective:   Physical Exam  Constitutional: She is oriented to person, place, and time. She appears well-developed and well-nourished.  Cardiovascular: Normal rate, regular rhythm and normal heart sounds.   Pulmonary/Chest: Effort normal and breath sounds normal.  Musculoskeletal:  Decreased ROM of cervical spine with pain on movement in any direction.  Neurological: She is alert and oriented to person, place, and time.  Skin: Skin is warm and dry.  Psychiatric: She has a normal mood and affect. Her behavior is normal. Judgment and thought content normal.   BP 103/65  Pulse 100  Temp(Src) 98.4 F (36.9 C) (Oral)  Ht _0  (1.727 m)  Wt 147 lb (66.679 kg)  BMI 22.36 kg/m2  chest x ray- no active pulmonary disease-Preliminary reading by Ronnald Collum, FNP  [facility] EKG- NSR- Mary-Margaret Hassell Done, FNP        Assessment & Plan:   1. Routine general medical examination at a health care facility   2. Preoperative clearance   3. Fibromyalgia   4. Smoking cessation  Orders Placed This Encounter  Procedures  . DG Chest 2 View    Standing Status: Future     Number of Occurrences: 1     Standing Expiration Date: 12/04/2014    Order Specific Question:  Reason for Exam (SYMPTOM  OR DIAGNOSIS REQUIRED)    Answer:  surgical clearance    Order Specific Question:  Is the patient pregnant?    Answer:  No    Order Specific Question:  Preferred imaging location?    Answer:  Internal    . CMP14+EGFR  . NMR, lipoprofile  . POCT CBC   Meds ordered this encounter  Medications  . varenicline (CHANTIX STARTING MONTH PAK) 0.5 MG X 11 & 1 MG X 42 tablet    Sig: As directed    Dispense:  53 tablet    Refill:  0    Order Specific Question:  Supervising Provider    Answer:  Chipper Herb [[id number]]  . varenicline (CHANTIX CONTINUING MONTH PAK) 1 MG tablet    Sig: Take 1 tablet (1 mg total) by mouth 2 (two) times daily.    Dispense:  60 tablet    Refill:  3    Order Specific Question:  Supervising Provider    Answer:  Chipper Herb [[id number]]  . HYDROcodone-acetaminophen (NORCO/VICODIN) 5-325 MG per tablet    Sig: Take 1 tablet by mouth every 8 (eight) hours as needed.    Dispense:  90 tablet    Refill:  0    Order Specific Question:  Supervising Provider    Answer:  Chipper Herb [[id number]]    Labs pending Health maintenance reviewed Diet and exercise encouraged Continue all meds Follow up  In 6 months   [place], FNP

## 2013-10-06 ENCOUNTER — Other Ambulatory Visit: Payer: Managed Care, Other (non HMO)

## 2013-10-06 LAB — POCT CBC
GRANULOCYTE PERCENT: 71.2 % (ref 37–80)
HCT, POC: 45.2 % (ref 37.7–47.9)
HEMOGLOBIN: 14.4 g/dL (ref 12.2–16.2)
Lymph, poc: 2.7 (ref 0.6–3.4)
MCH: 30.1 pg (ref 27–31.2)
MCHC: 31.9 g/dL (ref 31.8–35.4)
MCV: 94.3 fL (ref 80–97)
MPV: 9.3 fL (ref 0–99.8)
POC Granulocyte: 7.1 — AB (ref 2–6.9)
POC LYMPH %: 27.1 % (ref 10–50)
Platelet Count, POC: 163 10*3/uL (ref 142–424)
RBC: 4.8 M/uL (ref 4.04–5.48)
RDW, POC: 13.4 %
WBC: 10 10*3/uL (ref 4.6–10.2)

## 2013-10-07 LAB — CMP14+EGFR
A/G RATIO: 2.1 (ref 1.1–2.5)
ALBUMIN: 4.7 g/dL (ref 3.5–5.5)
ALT: 23 IU/L (ref 0–32)
AST: 21 IU/L (ref 0–40)
Alkaline Phosphatase: 81 IU/L (ref 39–117)
BUN/Creatinine Ratio: 13 (ref 9–23)
BUN: 9 mg/dL (ref 6–24)
CALCIUM: 10 mg/dL (ref 8.7–10.2)
CO2: 26 mmol/L (ref 18–29)
CREATININE: 0.67 mg/dL (ref 0.57–1.00)
Chloride: 100 mmol/L (ref 97–108)
GFR calc Af Amer: 114 mL/min/{1.73_m2} (ref >59)
GFR, EST NON AFRICAN AMERICAN: 99 mL/min/{1.73_m2} (ref >59)
GLOBULIN, TOTAL: 2.2 g/dL (ref 1.5–4.5)
GLUCOSE: 83 mg/dL (ref 65–99)
Potassium: 4.4 mmol/L (ref 3.5–5.2)
Sodium: 143 mmol/L (ref 134–144)
TOTAL PROTEIN: 6.9 g/dL (ref 6.0–8.5)
Total Bilirubin: 0.2 mg/dL (ref 0.0–1.2)

## 2013-10-07 LAB — NMR, LIPOPROFILE
Cholesterol: 177 mg/dL (ref <200)
HDL CHOLESTEROL BY NMR: 48 mg/dL (ref >=40)
HDL PARTICLE NUMBER: 38.6 umol/L (ref >=30.5)
LDL Particle Number: 1472 nmol/L — ABNORMAL HIGH (ref <1000)
LDL Size: 20.2 nm (ref >20.5)
LDLC SERPL CALC-MCNC: 89 mg/dL (ref <100)
LP-IR Score: 64 — ABNORMAL HIGH (ref <=45)
SMALL LDL PARTICLE NUMBER: 924 nmol/L — AB (ref <=527)
TRIGLYCERIDES BY NMR: 201 mg/dL — AB (ref <150)

## 2013-10-08 NOTE — Addendum Note (Signed)
Addended by: Zannie Cove on: 10/08/2013 10:59 AM   Modules accepted: Orders

## 2013-10-19 ENCOUNTER — Encounter (HOSPITAL_COMMUNITY): Payer: Self-pay | Admitting: Pharmacy Technician

## 2013-10-21 ENCOUNTER — Encounter (HOSPITAL_COMMUNITY): Payer: Self-pay

## 2013-10-21 ENCOUNTER — Ambulatory Visit (HOSPITAL_COMMUNITY)
Admission: RE | Admit: 2013-10-21 | Discharge: 2013-10-21 | Disposition: A | Discharge status: Home / Self Care | Payer: Managed Care, Other (non HMO) | Source: Ambulatory Visit | Attending: Orthopedic Surgery | Admitting: Orthopedic Surgery

## 2013-10-21 ENCOUNTER — Encounter (HOSPITAL_COMMUNITY)
Admission: RE | Admit: 2013-10-21 | Discharge: 2013-10-21 | Disposition: A | Discharge status: Home / Self Care | Payer: Managed Care, Other (non HMO) | Source: Ambulatory Visit | Attending: Orthopedic Surgery | Admitting: Orthopedic Surgery

## 2013-10-21 DIAGNOSIS — M5030 Other cervical disc degeneration, unspecified cervical region: Secondary | ICD-10-CM | POA: Insufficient documentation

## 2013-10-21 DIAGNOSIS — M47812 Spondylosis without myelopathy or radiculopathy, cervical region: Secondary | ICD-10-CM | POA: Insufficient documentation

## 2013-10-21 DIAGNOSIS — Z01812 Encounter for preprocedural laboratory examination: Secondary | ICD-10-CM | POA: Insufficient documentation

## 2013-10-21 DIAGNOSIS — Z01818 Encounter for other preprocedural examination: Secondary | ICD-10-CM | POA: Insufficient documentation

## 2013-10-21 HISTORY — DX: Other specified postprocedural states: Z98.890

## 2013-10-21 HISTORY — DX: Urinary tract infection, site not specified: N39.0

## 2013-10-21 HISTORY — DX: Other specified postprocedural states: R11.2

## 2013-10-21 HISTORY — DX: Family history of other specified conditions: Z84.89

## 2013-10-21 HISTORY — DX: Unspecified osteoarthritis, unspecified site: M19.90

## 2013-10-21 LAB — BASIC METABOLIC PANEL
BUN: 10 mg/dL (ref 6–23)
CHLORIDE: 98 meq/L (ref 96–112)
CO2: 29 mEq/L (ref 19–32)
Calcium: 9.7 mg/dL (ref 8.4–10.5)
Creatinine, Ser: 0.61 mg/dL (ref 0.50–1.10)
GFR calc non Af Amer: >90 mL/min (ref >90)
Glucose, Bld: 93 mg/dL (ref 70–99)
POTASSIUM: 4.1 meq/L (ref 3.7–5.3)
Sodium: 138 mEq/L (ref 137–147)

## 2013-10-21 LAB — CBC
HCT: 43.5 % (ref 36.0–46.0)
Hemoglobin: 14.9 g/dL (ref 12.0–15.0)
MCH: 32.3 pg (ref 26.0–34.0)
MCHC: 34.3 g/dL (ref 30.0–36.0)
MCV: 94.2 fL (ref 78.0–100.0)
PLATELETS: 186 10*3/uL (ref 150–400)
RBC: 4.62 MIL/uL (ref 3.87–5.11)
RDW: 13.2 % (ref 11.5–15.5)
WBC: 8.8 10*3/uL (ref 4.0–10.5)

## 2013-10-21 LAB — SURGICAL PCR SCREEN
MRSA, PCR: NEGATIVE
STAPHYLOCOCCUS AUREUS: NEGATIVE

## 2013-10-21 NOTE — Pre-Procedure Instructions (Signed)
[name]  10/21/2013   Your procedure is scheduled on:  Wednesday, May 20th  Report to [facility] Admitting at 0930 AM.  Call this number if you have problems the morning of surgery: [phone]   Remember:   Do not eat food or drink liquids after midnight.   Take these medicines the morning of surgery with A SIP OF WATER: pepcid, claritin, vicodin if needed, xanax if needed   Do not wear jewelry, make-up or nail polish.  Do not wear lotions, powders, or perfumes. You may wear deodorant.  Do not shave 48 hours prior to surgery. Men may shave face and neck.  Do not bring valuables to the hospital.  [facility] is not responsible for any belongings or valuables.               Contacts, dentures or bridgework may not be worn into surgery.  Leave suitcase in the car. After surgery it may be brought to your room.  For patients admitted to the hospital, discharge time is determined by your  treatment team.               Patients discharged the day of surgery will not be allowed to drive home.  Please read over the following fact sheets that you were given: Pain Booklet, Coughing and Deep Breathing, MRSA Information and Surgical Site Infection Prevention [place] - Preparing for Surgery  Before surgery, you can play an important role.  Because skin is not sterile, your skin needs to be as free of germs as possible.  You can reduce the number of germs on you skin by washing with CHG (chlorahexidine gluconate) soap before surgery.  CHG is an antiseptic cleaner which kills germs and bonds with the skin to continue killing germs even after washing.  Please DO NOT use if you have an allergy to CHG or antibacterial soaps.  If your skin becomes reddened/irritated stop using the CHG and inform your nurse when you arrive at Short Stay.  Do not shave (including legs and underarms) for at least 48 hours prior to the first CHG shower.  You may shave your face.  Please follow these  instructions carefully:   1.  Shower with CHG Soap the night before surgery and the morning of Surgery.  2.  If you choose to wash your hair, wash your hair first as usual with your normal shampoo.  3.  After you shampoo, rinse your hair and body thoroughly to remove the shampoo.  4.  Use CHG as you would any other liquid soap.  You can apply CHG directly to the skin and wash gently with scrungie or a clean washcloth.  5.  Apply the CHG Soap to your body ONLY FROM THE NECK DOWN.  Do not use on open wounds or open sores.  Avoid contact with your eyes, ears, mouth and genitals (private parts).  Wash genitals (private parts) with your normal soap.  6.  Wash thoroughly, paying special attention to the area where your surgery will be performed.  7.  Thoroughly rinse your body with warm water from the neck down.  8.  DO NOT shower/wash with your normal soap after using and rinsing off the CHG Soap.  9.  Pat yourself dry with a clean towel.            10.  Wear clean pajamas.            11.  Place clean sheets on  your bed the night of your first shower and do not sleep with pets.  Day of Surgery  Do not apply any lotions/deoderants the morning of surgery.  Please wear clean clothes to the hospital/surgery center.

## 2013-10-21 NOTE — Progress Notes (Signed)
PCP is at [place] and encounters are in [place]. Patient denied having any cardiac or pulmonary issues.

## 2013-10-26 MED ORDER — DEXAMETHASONE SODIUM PHOSPHATE 4 MG/ML IJ SOLN
8.0000 mg | Freq: Once | INTRAMUSCULAR | Status: AC
  Administered 2013-10-27: 10 mg via INTRAVENOUS
  Filled 2013-10-26: qty 2

## 2013-10-26 MED ORDER — CEFAZOLIN SODIUM-DEXTROSE 2-3 GM-% IV SOLR
2.0000 g | INTRAVENOUS | Status: AC
Start: 2013-10-27 — End: 2013-10-27
  Administered 2013-10-27: 1 g via INTRAVENOUS
  Filled 2013-10-26: qty 50

## 2013-10-26 MED ORDER — ACETAMINOPHEN 10 MG/ML IV SOLN
1000.0000 mg | Freq: Four times a day (QID) | INTRAVENOUS | Status: DC
  Administered 2013-10-27: 1000 mg via INTRAVENOUS
  Filled 2013-10-26: qty 100

## 2013-10-27 ENCOUNTER — Ambulatory Visit (HOSPITAL_COMMUNITY): Payer: Managed Care, Other (non HMO) | Admitting: Anesthesiology

## 2013-10-27 ENCOUNTER — Observation Stay (HOSPITAL_COMMUNITY): Payer: Managed Care, Other (non HMO)

## 2013-10-27 ENCOUNTER — Encounter (HOSPITAL_COMMUNITY)
Admission: RE | Disposition: A | Discharge status: Home / Self Care | Payer: Self-pay | Source: Ambulatory Visit | Attending: Orthopedic Surgery

## 2013-10-27 ENCOUNTER — Ambulatory Visit (HOSPITAL_COMMUNITY): Payer: Managed Care, Other (non HMO)

## 2013-10-27 ENCOUNTER — Observation Stay (HOSPITAL_COMMUNITY)
Admission: RE | Admit: 2013-10-27 | Discharge: 2013-10-28 | Disposition: A | Discharge status: Home / Self Care | Payer: Managed Care, Other (non HMO) | Source: Ambulatory Visit | Attending: Orthopedic Surgery | Admitting: Orthopedic Surgery

## 2013-10-27 ENCOUNTER — Encounter (HOSPITAL_COMMUNITY): Payer: Self-pay | Admitting: *Deleted

## 2013-10-27 ENCOUNTER — Encounter (HOSPITAL_COMMUNITY): Payer: Managed Care, Other (non HMO) | Admitting: Vascular Surgery

## 2013-10-27 DIAGNOSIS — K219 Gastro-esophageal reflux disease without esophagitis: Secondary | ICD-10-CM | POA: Insufficient documentation

## 2013-10-27 DIAGNOSIS — IMO0001 Reserved for inherently not codable concepts without codable children: Secondary | ICD-10-CM | POA: Insufficient documentation

## 2013-10-27 DIAGNOSIS — F17200 Nicotine dependence, unspecified, uncomplicated: Secondary | ICD-10-CM | POA: Insufficient documentation

## 2013-10-27 DIAGNOSIS — G43909 Migraine, unspecified, not intractable, without status migrainosus: Secondary | ICD-10-CM | POA: Insufficient documentation

## 2013-10-27 DIAGNOSIS — M4310 Spondylolisthesis, site unspecified: Secondary | ICD-10-CM | POA: Insufficient documentation

## 2013-10-27 DIAGNOSIS — M542 Cervicalgia: Secondary | ICD-10-CM | POA: Diagnosis present

## 2013-10-27 DIAGNOSIS — E7800 Pure hypercholesterolemia, unspecified: Secondary | ICD-10-CM | POA: Insufficient documentation

## 2013-10-27 DIAGNOSIS — M47812 Spondylosis without myelopathy or radiculopathy, cervical region: Principal | ICD-10-CM | POA: Insufficient documentation

## 2013-10-27 HISTORY — PX: ANTERIOR CERVICAL DECOMP/DISCECTOMY FUSION: SHX1161

## 2013-10-27 SURGERY — ANTERIOR CERVICAL DECOMPRESSION/DISCECTOMY FUSION 2 LEVELS
Anesthesia: General | Site: Neck

## 2013-10-27 MED ORDER — THROMBIN 20000 UNITS EX SOLR
CUTANEOUS | Status: AC
  Filled 2013-10-27: qty 20000

## 2013-10-27 MED ORDER — PHENYLEPHRINE 40 MCG/ML (10ML) SYRINGE FOR IV PUSH (FOR BLOOD PRESSURE SUPPORT)
PREFILLED_SYRINGE | INTRAVENOUS | Status: AC
  Filled 2013-10-27: qty 30

## 2013-10-27 MED ORDER — ROCURONIUM BROMIDE 50 MG/5ML IV SOLN
INTRAVENOUS | Status: AC
  Filled 2013-10-27: qty 2

## 2013-10-27 MED ORDER — ACETAMINOPHEN 10 MG/ML IV SOLN
1000.0000 mg | Freq: Four times a day (QID) | INTRAVENOUS | Status: DC
  Administered 2013-10-28 (×2): 1000 mg via INTRAVENOUS
  Filled 2013-10-27 (×4): qty 100

## 2013-10-27 MED ORDER — FENTANYL CITRATE 0.05 MG/ML IJ SOLN
INTRAMUSCULAR | Status: DC | PRN
  Administered 2013-10-27: 50 ug via INTRAVENOUS
  Administered 2013-10-27: 150 ug via INTRAVENOUS
  Administered 2013-10-27 (×2): 50 ug via INTRAVENOUS

## 2013-10-27 MED ORDER — BUPIVACAINE-EPINEPHRINE (PF) 0.25% -1:200000 IJ SOLN
INTRAMUSCULAR | Status: AC
  Filled 2013-10-27: qty 30

## 2013-10-27 MED ORDER — DEXAMETHASONE SODIUM PHOSPHATE 4 MG/ML IJ SOLN
4.0000 mg | Freq: Four times a day (QID) | INTRAMUSCULAR | Status: DC
  Administered 2013-10-28 (×2): 4 mg via INTRAVENOUS
  Filled 2013-10-27 (×7): qty 1

## 2013-10-27 MED ORDER — HYDROMORPHONE HCL PF 1 MG/ML IJ SOLN
INTRAMUSCULAR | Status: AC
  Filled 2013-10-27: qty 1

## 2013-10-27 MED ORDER — HEMOSTATIC AGENTS (NO CHARGE) OPTIME
TOPICAL | Status: DC | PRN
  Administered 2013-10-27: 1 via TOPICAL

## 2013-10-27 MED ORDER — SODIUM CHLORIDE 0.9 % IJ SOLN: 3.0000 mL | INTRAMUSCULAR | Status: DC | PRN

## 2013-10-27 MED ORDER — PHENYLEPHRINE HCL 10 MG/ML IJ SOLN
INTRAMUSCULAR | Status: AC
  Filled 2013-10-27: qty 1

## 2013-10-27 MED ORDER — METHOCARBAMOL 500 MG PO TABS
500.0000 mg | ORAL_TABLET | Freq: Four times a day (QID) | ORAL | Status: DC | PRN
  Administered 2013-10-27 – 2013-10-28 (×2): 500 mg via ORAL
  Filled 2013-10-27 (×2): qty 1

## 2013-10-27 MED ORDER — ARTIFICIAL TEARS OP OINT
TOPICAL_OINTMENT | OPHTHALMIC | Status: DC | PRN
  Administered 2013-10-27: 1 via OPHTHALMIC

## 2013-10-27 MED ORDER — LACTATED RINGERS IV SOLN
INTRAVENOUS | Status: DC | PRN
  Administered 2013-10-27 (×3): via INTRAVENOUS

## 2013-10-27 MED ORDER — ALPRAZOLAM 0.25 MG PO TABS: 0.2500 mg | ORAL_TABLET | Freq: Two times a day (BID) | ORAL | Status: DC | PRN

## 2013-10-27 MED ORDER — EPHEDRINE SULFATE 50 MG/ML IJ SOLN
INTRAMUSCULAR | Status: AC
  Filled 2013-10-27: qty 2

## 2013-10-27 MED ORDER — ARTIFICIAL TEARS OP OINT
TOPICAL_OINTMENT | OPHTHALMIC | Status: AC
  Filled 2013-10-27: qty 3.5

## 2013-10-27 MED ORDER — ONDANSETRON HCL 4 MG/2ML IJ SOLN
INTRAMUSCULAR | Status: AC
  Filled 2013-10-27: qty 2

## 2013-10-27 MED ORDER — HYDROMORPHONE HCL PF 1 MG/ML IJ SOLN
INTRAMUSCULAR | Status: DC | PRN
  Administered 2013-10-27: .2 mg via INTRAVENOUS

## 2013-10-27 MED ORDER — METHOCARBAMOL 500 MG PO TABS
ORAL_TABLET | ORAL | Status: AC
  Filled 2013-10-27: qty 1

## 2013-10-27 MED ORDER — FENTANYL CITRATE 0.05 MG/ML IJ SOLN
INTRAMUSCULAR | Status: AC
  Filled 2013-10-27: qty 5

## 2013-10-27 MED ORDER — ONDANSETRON HCL 4 MG/2ML IJ SOLN
INTRAMUSCULAR | Status: DC | PRN
  Administered 2013-10-27: 4 mg via INTRAVENOUS

## 2013-10-27 MED ORDER — THROMBIN 20000 UNITS EX SOLR
OROMUCOSAL | Status: DC | PRN
  Administered 2013-10-27: 12:00:00 via TOPICAL

## 2013-10-27 MED ORDER — NEOSTIGMINE METHYLSULFATE 10 MG/10ML IV SOLN
INTRAVENOUS | Status: AC
  Filled 2013-10-27: qty 1

## 2013-10-27 MED ORDER — ROCURONIUM BROMIDE 100 MG/10ML IV SOLN
INTRAVENOUS | Status: DC | PRN
  Administered 2013-10-27: 40 mg via INTRAVENOUS
  Administered 2013-10-27 (×2): 10 mg via INTRAVENOUS

## 2013-10-27 MED ORDER — MENTHOL 3 MG MT LOZG: 1.0000 | LOZENGE | OROMUCOSAL | Status: DC | PRN

## 2013-10-27 MED ORDER — 0.9 % SODIUM CHLORIDE (POUR BTL) OPTIME
TOPICAL | Status: DC | PRN
  Administered 2013-10-27: 1000 mL

## 2013-10-27 MED ORDER — OXYCODONE HCL 5 MG PO TABS
10.0000 mg | ORAL_TABLET | ORAL | Status: DC | PRN
  Administered 2013-10-27 – 2013-10-28 (×4): 10 mg via ORAL
  Filled 2013-10-27 (×3): qty 2

## 2013-10-27 MED ORDER — LACTATED RINGERS IV SOLN
INTRAVENOUS | Status: DC
  Administered 2013-10-27 (×2): 85 mL/h via INTRAVENOUS

## 2013-10-27 MED ORDER — LIDOCAINE HCL (CARDIAC) 20 MG/ML IV SOLN
INTRAVENOUS | Status: AC
  Filled 2013-10-27: qty 5

## 2013-10-27 MED ORDER — SODIUM CHLORIDE 0.9 % IJ SOLN
3.0000 mL | Freq: Two times a day (BID) | INTRAMUSCULAR | Status: DC
  Administered 2013-10-27: 3 mL via INTRAVENOUS

## 2013-10-27 MED ORDER — ROCURONIUM BROMIDE 50 MG/5ML IV SOLN
INTRAVENOUS | Status: AC
  Filled 2013-10-27: qty 1

## 2013-10-27 MED ORDER — DEXMEDETOMIDINE HCL IN NACL 200 MCG/50ML IV SOLN
INTRAVENOUS | Status: AC
  Filled 2013-10-27: qty 100

## 2013-10-27 MED ORDER — DEXAMETHASONE SODIUM PHOSPHATE 4 MG/ML IJ SOLN
INTRAMUSCULAR | Status: AC
  Filled 2013-10-27: qty 2

## 2013-10-27 MED ORDER — GLYCOPYRROLATE 0.2 MG/ML IJ SOLN
INTRAMUSCULAR | Status: AC
  Filled 2013-10-27: qty 3

## 2013-10-27 MED ORDER — PROPOFOL 10 MG/ML IV BOLUS
INTRAVENOUS | Status: DC | PRN
  Administered 2013-10-27: 150 mg via INTRAVENOUS

## 2013-10-27 MED ORDER — PHENOL 1.4 % MT LIQD: 1.0000 | OROMUCOSAL | Status: DC | PRN

## 2013-10-27 MED ORDER — DEXAMETHASONE 4 MG PO TABS
4.0000 mg | ORAL_TABLET | Freq: Four times a day (QID) | ORAL | Status: DC
  Filled 2013-10-27 (×6): qty 1

## 2013-10-27 MED ORDER — BUPIVACAINE-EPINEPHRINE 0.25% -1:200000 IJ SOLN
INTRAMUSCULAR | Status: DC | PRN
  Administered 2013-10-27: 3 mL

## 2013-10-27 MED ORDER — DEXAMETHASONE SODIUM PHOSPHATE 4 MG/ML IJ SOLN
4.0000 mg | Freq: Once | INTRAMUSCULAR | Status: DC
  Filled 2013-10-27: qty 1

## 2013-10-27 MED ORDER — MIDAZOLAM HCL 2 MG/2ML IJ SOLN
INTRAMUSCULAR | Status: AC
  Filled 2013-10-27: qty 2

## 2013-10-27 MED ORDER — OXYCODONE HCL 5 MG PO TABS
ORAL_TABLET | ORAL | Status: AC
  Filled 2013-10-27: qty 2

## 2013-10-27 MED ORDER — METHOCARBAMOL 1000 MG/10ML IJ SOLN
500.0000 mg | Freq: Four times a day (QID) | INTRAVENOUS | Status: DC | PRN
  Filled 2013-10-27: qty 5

## 2013-10-27 MED ORDER — GLYCOPYRROLATE 0.2 MG/ML IJ SOLN
INTRAMUSCULAR | Status: DC | PRN
  Administered 2013-10-27: .8 mg via INTRAVENOUS

## 2013-10-27 MED ORDER — DEXAMETHASONE SODIUM PHOSPHATE 4 MG/ML IJ SOLN
INTRAMUSCULAR | Status: AC
  Filled 2013-10-27: qty 1

## 2013-10-27 MED ORDER — ONDANSETRON HCL 4 MG/2ML IJ SOLN: 4.0000 mg | INTRAMUSCULAR | Status: DC | PRN

## 2013-10-27 MED ORDER — PHENYLEPHRINE HCL 10 MG/ML IJ SOLN
INTRAMUSCULAR | Status: DC | PRN
  Administered 2013-10-27: 80 ug via INTRAVENOUS
  Administered 2013-10-27: 40 ug via INTRAVENOUS
  Administered 2013-10-27: 80 ug via INTRAVENOUS
  Administered 2013-10-27 (×2): 40 ug via INTRAVENOUS
  Administered 2013-10-27: 80 ug via INTRAVENOUS
  Administered 2013-10-27 (×2): 40 ug via INTRAVENOUS
  Administered 2013-10-27 (×2): 80 ug via INTRAVENOUS

## 2013-10-27 MED ORDER — CEFAZOLIN SODIUM 1-5 GM-% IV SOLN
1.0000 g | Freq: Three times a day (TID) | INTRAVENOUS | Status: AC
  Administered 2013-10-27 – 2013-10-28 (×2): 1 g via INTRAVENOUS
  Filled 2013-10-27 (×2): qty 50

## 2013-10-27 MED ORDER — DEXAMETHASONE SODIUM PHOSPHATE 10 MG/ML IJ SOLN
INTRAMUSCULAR | Status: AC
  Filled 2013-10-27: qty 1

## 2013-10-27 MED ORDER — NEOSTIGMINE METHYLSULFATE 10 MG/10ML IV SOLN
INTRAVENOUS | Status: DC | PRN
  Administered 2013-10-27: 5 mg via INTRAVENOUS

## 2013-10-27 MED ORDER — MORPHINE SULFATE 2 MG/ML IJ SOLN
1.0000 mg | INTRAMUSCULAR | Status: DC | PRN
  Administered 2013-10-27: 1 mg via INTRAVENOUS
  Filled 2013-10-27: qty 1

## 2013-10-27 MED ORDER — FENTANYL CITRATE 0.05 MG/ML IJ SOLN
INTRAMUSCULAR | Status: AC
  Filled 2013-10-27: qty 2

## 2013-10-27 MED ORDER — SODIUM CHLORIDE 0.9 % IV SOLN: 250.0000 mL | INTRAVENOUS | Status: DC

## 2013-10-27 MED ORDER — HYDROMORPHONE HCL PF 1 MG/ML IJ SOLN
0.2500 mg | INTRAMUSCULAR | Status: DC | PRN
  Administered 2013-10-27 (×4): 0.5 mg via INTRAVENOUS

## 2013-10-27 MED ORDER — EPHEDRINE SULFATE 50 MG/ML IJ SOLN
INTRAMUSCULAR | Status: DC | PRN
  Administered 2013-10-27 (×2): 10 mg via INTRAVENOUS
  Administered 2013-10-27: 5 mg via INTRAVENOUS
  Administered 2013-10-27 (×4): 10 mg via INTRAVENOUS
  Administered 2013-10-27: 5 mg via INTRAVENOUS
  Administered 2013-10-27 (×2): 10 mg via INTRAVENOUS
  Administered 2013-10-27 (×3): 5 mg via INTRAVENOUS

## 2013-10-27 MED ORDER — LIDOCAINE HCL (CARDIAC) 20 MG/ML IV SOLN
INTRAVENOUS | Status: DC | PRN
  Administered 2013-10-27: 80 mg via INTRAVENOUS

## 2013-10-27 MED ORDER — MIDAZOLAM HCL 5 MG/5ML IJ SOLN
INTRAMUSCULAR | Status: DC | PRN
  Administered 2013-10-27: 2 mg via INTRAVENOUS

## 2013-10-27 SURGICAL SUPPLY — 67 items
BIT DRILL SKYLINE 12MM (BIT) IMPLANT
BLADE SURG ROTATE 9660 (MISCELLANEOUS) IMPLANT
BUR EGG ELITE 4.0 (BURR) IMPLANT
BUR EGG ELITE 4.0MM (BURR)
BUR MATCHSTICK NEURO 3.0 LAGG (BURR) IMPLANT
CANISTER SUCTION 2500CC (MISCELLANEOUS) ×3 IMPLANT
CLOSURE STERI-STRIP 1/2X4 (GAUZE/BANDAGES/DRESSINGS) ×1
CLSR STERI-STRIP ANTIMIC 1/2X4 (GAUZE/BANDAGES/DRESSINGS) ×2 IMPLANT
CORDS BIPOLAR (ELECTRODE) ×3 IMPLANT
COVER SURGICAL LIGHT HANDLE (MISCELLANEOUS) ×6 IMPLANT
CRADLE DONUT ADULT HEAD (MISCELLANEOUS) ×3 IMPLANT
DRAIN TLS ROUND 10FR (DRAIN) ×2 IMPLANT
DRAPE C-ARM 42X72 X-RAY (DRAPES) ×3 IMPLANT
DRAPE POUCH INSTRU U-SHP 10X18 (DRAPES) ×3 IMPLANT
DRAPE SURG 17X23 STRL (DRAPES) ×3 IMPLANT
DRAPE U-SHAPE 47X51 STRL (DRAPES) ×3 IMPLANT
DRILL BIT SKYLINE 12MM (BIT) ×3
DRSG MEPILEX BORDER 4X4 (GAUZE/BANDAGES/DRESSINGS) ×3 IMPLANT
DURAPREP 26ML APPLICATOR (WOUND CARE) ×3 IMPLANT
ELECT COATED BLADE 2.86 ST (ELECTRODE) ×3 IMPLANT
ELECT REM PT RETURN 9FT ADLT (ELECTROSURGICAL) ×3
ELECTRODE REM PT RTRN 9FT ADLT (ELECTROSURGICAL) ×1 IMPLANT
GLOVE BIOGEL PI IND STRL 8 (GLOVE) ×1 IMPLANT
GLOVE BIOGEL PI IND STRL 8.5 (GLOVE) ×1 IMPLANT
GLOVE BIOGEL PI INDICATOR 8 (GLOVE) ×2
GLOVE BIOGEL PI INDICATOR 8.5 (GLOVE) ×2
GLOVE ECLIPSE 8.5 STRL (GLOVE) ×3 IMPLANT
GLOVE ORTHO TXT STRL SZ7.5 (GLOVE) ×3 IMPLANT
GOWN STRL REUS W/ TWL XL LVL3 (GOWN DISPOSABLE) ×2 IMPLANT
GOWN STRL REUS W/TWL 2XL LVL3 (GOWN DISPOSABLE) ×6 IMPLANT
GOWN STRL REUS W/TWL XL LVL3 (GOWN DISPOSABLE) ×6
INTERLOCK LRDTC CRVCL VBR 7MM (Bone Implant) IMPLANT
KIT BASIN OR (CUSTOM PROCEDURE TRAY) ×3 IMPLANT
KIT ROOM TURNOVER OR (KITS) ×3 IMPLANT
LORDOTIC CERVICAL VBR 7MM SM (Bone Implant) ×6 IMPLANT
NDL SPNL 18GX3.5 QUINCKE PK (NEEDLE) ×1 IMPLANT
NEEDLE SPNL 18GX3.5 QUINCKE PK (NEEDLE) ×3 IMPLANT
NS IRRIG 1000ML POUR BTL (IV SOLUTION) ×3 IMPLANT
PACK ORTHO CERVICAL (CUSTOM PROCEDURE TRAY) ×3 IMPLANT
PACK UNIVERSAL I (CUSTOM PROCEDURE TRAY) ×3 IMPLANT
PAD ARMBOARD 7.5X6 YLW CONV (MISCELLANEOUS) ×6 IMPLANT
PATTIES SURGICAL .25X.25 (GAUZE/BANDAGES/DRESSINGS) ×3 IMPLANT
PIN DISTRACTION 14 (PIN) ×4 IMPLANT
PIN RETAINER PRODISC 14 MM (PIN) ×2 IMPLANT
PLATE TWO LEVEL SKYLINE 30MM (Plate) ×2 IMPLANT
PUTTY BONE DBX 2.5 MIS (Bone Implant) ×2 IMPLANT
RESTRAINT LIMB HOLDER UNIV (RESTRAINTS) ×3 IMPLANT
SCREW SELF DRILL SKYLINE 12MM (Screw) ×8 IMPLANT
SCREW SKYLINE 14MM SD-VA (Screw) ×4 IMPLANT
SPONGE INTESTINAL PEANUT (DISPOSABLE) ×3 IMPLANT
SPONGE LAP 4X18 X RAY DECT (DISPOSABLE) ×6 IMPLANT
SPONGE SURGIFOAM ABS GEL 100 (HEMOSTASIS) ×3 IMPLANT
SURGIFLO TRUKIT (HEMOSTASIS) IMPLANT
SUT MON AB 3-0 SH 27 (SUTURE) ×3
SUT MON AB 3-0 SH27 (SUTURE) ×1 IMPLANT
SUT SILK 2 0 (SUTURE)
SUT SILK 2-0 18XBRD TIE 12 (SUTURE) IMPLANT
SUT VIC AB 2-0 CT1 18 (SUTURE) ×3 IMPLANT
SYR BULB IRRIGATION 50ML (SYRINGE) ×3 IMPLANT
SYR CONTROL 10ML LL (SYRINGE) ×3 IMPLANT
TAPE CLOTH 4X10 WHT NS (GAUZE/BANDAGES/DRESSINGS) ×3 IMPLANT
TAPE CLOTH SURG 4X10 WHT LF (GAUZE/BANDAGES/DRESSINGS) ×2 IMPLANT
TAPE UMBILICAL COTTON 1/8X30 (MISCELLANEOUS) ×5 IMPLANT
TOWEL OR 17X24 6PK STRL BLUE (TOWEL DISPOSABLE) ×3 IMPLANT
TOWEL OR 17X26 10 PK STRL BLUE (TOWEL DISPOSABLE) ×3 IMPLANT
WATER STERILE IRR 1000ML POUR (IV SOLUTION) ×3 IMPLANT
YANKAUER SUCT BULB TIP NO VENT (SUCTIONS) ×2 IMPLANT

## 2013-10-27 NOTE — Op Note (Signed)
NAMEBRYLYN, [name]              ACCOUNT NO.:  [id number]  MEDICAL RECORD NO.:  [id number]  LOCATION:  5N12C                        FACILITY:  [facility]  PHYSICIAN:  Dahlia Bailiff, MD    DATE OF BIRTH:  [birth year]  DATE OF PROCEDURE:  10/27/2013 DATE OF DISCHARGE:                              OPERATIVE REPORT   PREOPERATIVE DIAGNOSIS:  Cervical spondylotic radiculopathy, C5-6-C6-7.  POSTOPERATIVE DIAGNOSIS:  Cervical spondylotic radiculopathy, C5-6-C6-7.  OPERATIVE PROCEDURE:  Anterior cervical diskectomy and fusion, C5-6, C6- 7.  COMPLICATIONS:  None.  CONDITION:  Stable.  HISTORY:  This is a very pleasant 53 year old woman, who is having significant neck and radicular right arm pain.  The patient's symptoms persisted despite self-directed exercises and anti-inflammatory medications.  When she presented to me, there was C6 radicular complaints of pain, numbness, and weakness.  Because of the neurological deficits and severe pain, we elected to proceed with surgery.  All appropriate risks, benefits, and alternatives were discussed and consent was obtained.  OPERATIVE NOTE:  The patient was brought to the operating room, placed supine on the operating table.  After successful induction of general anesthesia and endotracheal intubation, TED, SCDs were applied.  The arms were secured at the side, a pressure bag was placed beneath the scapula and the anterior cervical spine was prepped and draped in a standard fashion.  Pressure bag was deflated to restore lordosis of her neck.  Time-out was taken confirming the patient, procedure, and all other pertinent important data.  A standard Smith-Robinson approach to cervical spine was undertaken via transverse incision was made.  I identified the C6 vertebral body and then made a transverse incision centered over this.  Sharp dissection was carried out down to the platysma.  The platysma was sharply incised and I sharply dissected  along the medial border of the sternocleidomastoid.  Careful dissection was taken through the deep cervical and prevertebral fascia.  I then swept the esophagus and trachea up to the right, palpated and protected the carotid sheath to the left.  I then used Kittner dissectors to finalize my dissection and expose the C5-6 and C6-7 disk space.  A needle was placed into the C5-6 disk space, and I confirmed that I was at the appropriate level.  Once this was done, I then used bipolar electrocautery to mobilize this longus colli muscles from the midbody of C5 to the midbody of C7.  Once this was done, I used a double-action Leksell rongeur to remove the large anterior exostosis at C5-6  and C6-7 disk space.  I then placed a self-retaining Caspar retractor blade beneath the longus colli muscle. I deflated the endotracheal cuff, expanded the retractors appropriately. Distraction pins were placed into the body of C6 and C7.  An annulotomy of the C6-7 disk space was undertaken with a #15 blade scalpel.  Using a combination of pituitary rongeurs, curettes, and Kerrison rongeurs, I removed all the disk material at this level.  I  used the interlaminar spreader to open up the space and then maintained it with distraction pins.  I continued my dissection posteriorly resecting the overhanging osteophytes from the posterior aspect of the vertebral bodies of C6 and C7.  I used small nerve hook to develop a plane underneath the annulus and posterior longitudinal ligament.  I then resected the posterior longitudinal ligament and posterior annulus with a 1 mm Kerrison.  This allowed me to get my #1 Kerrison underneath the uncovertebral joint and decompressed the bone spur at this level.  There was a small hard disk osteophyte noted since this was seen on the MRI.  Once this was removed, I rasped the endplates and then placed a size 7 small Titan titanium intervertebral cage packed with DBX mix.  I  then placed distraction pins into the body of C5 and distracted the C5-6 disk space.  Using the same technique, I used at C6-7.  I resected the entire disk space at C5-6.  Again, I resected the posterior annulus and the osteophytes at the uncovertebral joint level.  I rasped the endplates and then measured and placed a size implant and packed with DBX mix. Once I had both implants in place, I then removed all the distraction pins, and has retracting system.  I irrigated the wound copiously with normal saline, and then obtained a size 30 mm skyline DePuy anterior cervical plate.  I then fixed it into the bodies of C5 with 14 mm screws and into the bodies of C6 and 7 with 12 mm screws.  All 6 screws had excellent purchase.  I then locked them in place per manufacture's standards.  Final x-rays were satisfactory.  I irrigated the wound again copiously with normal saline.  There was no active bleeding I could find, but I was worried about some postoperative swelling especially with her smoking history.  I elected to place a drain.  A small drain was placed and brought out through a separate stab incision.  I then closed the platysma with interrupted 2-0 Vicryl sutures and a 3-0 Monocryl for the skin.  Steri-Strips and dry dressing were then applied. Hard collar was applied and the patient was ultimately extubated and transferred to the PACU without incident.  At the end of the case, all needle and sponge counts were correct.  There was no adverse intraoperative events.     Dahlia Bailiff, MD     DDB/MEDQ  D:  10/27/2013  T:  10/27/2013  Job:  [id number]

## 2013-10-27 NOTE — H&P (Signed)
History of Present Illness  The patient is a 53 year old female who presents to the practice today for a transition into care. The patient is transitioning into care from another physician (Dr Amedeo Plenty who has been treating her for hand pain and is here for neck pain ) .   Neck painis described as the following:  The patient is seen today in referral from Dr Amedeo Plenty. The patient reports symptoms involving the entire neck which began 6 month(s) ago. The symptoms began without any known injury. Symptoms include neck pain, neck stiffness, numbness (right hand (carpal tunnel)), upper extremity weakness and headaches.The patient describes their symptoms as moderate in severity.The patient does feel that the symptoms are worsening. Symptoms are not relieved by rest or nonsteroidal anti-inflammatory drugs. Current treatment includes opioid analgesics (Norco). Prior to being seen today the patient was previously evaluated by a colleague (Dr Amedeo Plenty). Past evaluation has included cervical spine MRI (@ [facility]) and nerve conduction velocity studies (Dr Nelva Bush).  She has been taking non-narcotic and narcotic pain killers consistent with antiinflammatories and hydrocodone. Despite this her radicular arm pain is intensifying.  Allergies Plastic Bottle/Nipple *MEDICAL DEVICES* Tape 1"X5yd *MEDICAL DEVICES*. patient needs paper tape used, states other tapes pull her skin off  Family History Diabetes Mellitus. Father. Drug / Alcohol Addiction. Maternal Grandfather. Heart Disease. Father. Kidney disease. Maternal Grandfather. Liver Disease, Chronic. Maternal Grandfather. Osteoarthritis. Mother. Osteoporosis. Mother. Severe allergy. Maternal Grandfather. Cancer. Father, Maternal Grandfather.  Social History Children. 2 Current work status. working full time Exercise. Exercises weekly; does running / walking Living situation. live with partner Marital status. single No history of  drug/alcohol rehab Number of flights of stairs before winded. 2-3 Tobacco / smoke exposure. 07/30/2013: no Tobacco use. Current every day smoker. 07/30/2013: smoke(d) 3/4 pack(s) per day Current drinker. 07/30/2013: Currently drinks beer only occasionally per week   Medication History Norco (5-325MG  Tablet, 1 (one) Tablet(s) Oral every 4-6 hours prn pain, Taken starting 09/22/2013) Active. Famotidine (20MG  Tablet, 1 Oral two times daily) Active. Loratadine Allergy Relief (10MG  Tablet Disperse, 1 Oral two times daily) Active. Multivitamins (1 Oral daily) Active. Pravastatin Sodium (40MG  Tablet, 1 Oral daily) Active. Probiotic (1 Oral daily) Active. Savella (50MG  Tablet, 2 Oral daily) Active. Vitamin E (400UNIT Capsule, 1 Oral daily) Active. Medications Reconciled.  Other Problems Migraine Headache Oophorectomy. bilateral Unspecified Diagnosis Fibromyalgia Gastroesophageal Reflux Disease Hypercholesterolemia Degeneration, lumbar/lumbosacral disc (722.52). 12/02/2000 Spondylolisthesis, acquired (738.4). 12/02/2000  Objective Transcription  She is a right handed pleasant female who has had significant loss in function and quality of life because of the radicular right arm pain. She is alert and oriented x3. She appears younger than her stated age. She has no shortness of breath or chest pain. The abdomen is soft and non-tender. She ambulates without any gait disturbance. She has significant dysesthesias in the C6 and C7 distribution on the right side. She has trace weakness of the bicep and triceps and wrist extensor on the right side only. The left side is 5/5. Grip strength is well maintained. Symmetrical but diminished deep tendon reflexes. She has no real shoulder, elbow, or wrist pain with joint range of motion. She does have localized pain over the thumb on the right side.Intact peripheral pulses.  RADIOGRAPHS:  At this point in time I have gone over her  x-rays and MRI. The x-rays show kind of multilevel mild degenerative spondylosis, moderate to severe at 5-6 and moderate at 6-7. No loss of lordosis. Her MRI shows show severe foraminal  stenosis and nerve compression secondary to hard disc osteophyte at 5-6 and 6-7 level. This is both on the right side. She has posterior osteophyte complexes causing foraminal stenosis affecting the exiting nerve roots. There is no cord signal change. There is mild to moderate disease at the 2-3, 3-4, 4-5, and 7-1 levels.  Assessment & Plan  Risks of surgery include, but are not limited to: Throat pain,swallowing difficulty, hoarseness or change in voice, Death, stroke, paralysis, nerve root damage/injury, bleeding, blood clots, loss of bowel/bladder control, hardware failure, or malposition, spinal fluid leak, adjacent segment disease, non-union, need for further surgery, ongoing or worse pain, infection and recurrent disc herniation  ADDENDUM PER DR. Rolena Infante: The patient has had debilitating neck and radicular right arm pain for 5.5 months now. She states that she has always had some neck problems, but in November became significantly worse. She recently had an epidural cervical injection and had significant improvement for about 24-48 hours. She has had a nerve conduction test, which showed some mild peripheral neuropathy at the elbow and wrist, but was more consistent with cervical radiculopathy. She has modified her activities. She has done some self-directed exercises.  Because of her nicotine use and the fact that we are doing a multilevel cervical procedure, I am going to request an external bone stimulator following her surgery to improve her overall fusion rates.  At this point in time the patient has severe debilitating neck pain and radicular arm pain in the C6 and C7 distribution. There is trace motor deficits. There is obvious sensory changes and dysesthesias. At this point, despite an  epidural injection, self directed exercises, activity modification, narcotic, and non-narcotic medications her overall quality of life continues to diminish. Her pain is becoming progressively worse. We have discussed the surgical solution for her problem which will be a two level ACDF. The reason to do two levels is she is having both C6 and C7 radicular symptoms and I would address both of those by doing a decompression and fusion. We have gone over the risks which include infection, bleeding, nerve damage, death, stroke, paralysis, failure to heal, need for further surgery, ongoing or worse pain, loss of bowel and bladder control, throat pain, swallowing difficulties, hoarseness in the voice, need for posterior supplemental fusion. We have discussed with her cigarette smoking and she is going to speak with her primary care physician about Chantax and stopping cigarette smoking. We will get preoperative medical clearance.

## 2013-10-27 NOTE — Transfer of Care (Signed)
Immediate Anesthesia Transfer of Care Note  Patient: [name]  Procedure(s) Performed: Procedure(s): ACDF/ANTERIOR CERVICAL DECOMPRESSION/DISCECTOMY FUSION  C5-C7  (2 LEVELS) (N/A)  Patient Location: PACU  Anesthesia Type:General  Level of Consciousness: awake, alert , oriented and patient cooperative  Airway & Oxygen Therapy: Patient Spontanous Breathing and Patient connected to nasal cannula oxygen  Post-op Assessment: Report given to PACU RN, Post -op Vital signs reviewed and stable and Patient moving all extremities  Post vital signs: Reviewed and stable  Complications: No apparent anesthesia complications

## 2013-10-27 NOTE — Anesthesia Procedure Notes (Signed)
Date/Time: 10/27/2013 11:55 AM Performed by: Sherilyn Banker Pre-anesthesia Checklist: Patient identified, Emergency Drugs available, Suction available, Patient being monitored and Timeout performed Patient Re-evaluated:Patient Re-evaluated prior to inductionOxygen Delivery Method: Circle system utilized Preoxygenation: Pre-oxygenation with 100% oxygen Intubation Type: IV induction Ventilation: Mask ventilation without difficulty Laryngoscope Size: Mac and 3 Tube type: Oral Tube size: 7.0 mm Number of attempts: 1 Airway Equipment and Method: Stylet Placement Confirmation: ETT inserted through vocal cords under direct vision,  positive ETCO2 and breath sounds checked- equal and bilateral Secured at: 22 cm Tube secured with: Tape Dental Injury: Teeth and Oropharynx as per pre-operative assessment

## 2013-10-27 NOTE — Anesthesia Postprocedure Evaluation (Signed)
  Anesthesia Post-op Note  Patient: [name]  Procedure(s) Performed: Procedure(s): ACDF/ANTERIOR CERVICAL DECOMPRESSION/DISCECTOMY FUSION  C5-C7  (2 LEVELS) (N/A)  Patient Location: PACU  Anesthesia Type:General  Level of Consciousness: awake, alert  and oriented  Airway and Oxygen Therapy: Patient Spontanous Breathing and Patient connected to nasal cannula oxygen  Post-op Pain: mild  Post-op Assessment: Post-op Vital signs reviewed  Post-op Vital Signs: Reviewed  Last Vitals:  Filed Vitals:   10/27/13 1730  BP:   Pulse: 94  Temp:   Resp: 12    Complications: No apparent anesthesia complications

## 2013-10-27 NOTE — Anesthesia Preprocedure Evaluation (Addendum)
Anesthesia Evaluation  Patient identified by MRN, date of birth, ID band Patient awake    Reviewed: Allergy & Precautions, H&P , NPO status , Patient's Chart, lab work & pertinent test results  History of Anesthesia Complications (+) PONV  Airway Mallampati: II TM Distance: >3 FB   Mouth opening: Limited Mouth Opening Comment: 52 year old female for 2 level ACDF C56-6-7, controlled gerd, h/o guilliam barre denieas any current problems or symptoms, current smoker, fair mouth opening patient states she has TMJ and has h/o dislocation Dental  (+) Dental Advisory Given, Teeth Intact   Pulmonary Current Smoker,    Pulmonary exam normal       Cardiovascular Exercise Tolerance: Good METS: 3 - Mets Rhythm:Regular Rate:Normal     Neuro/Psych  Headaches,    GI/Hepatic Neg liver ROS, GERD-  Controlled and Medicated,  Endo/Other  negative endocrine ROS  Renal/GU negative Renal ROS     Musculoskeletal  (+) Fibromyalgia -  Abdominal   Peds  Hematology negative hematology ROS (+)   Anesthesia Other Findings   Reproductive/Obstetrics                         Anesthesia Physical Anesthesia Plan  ASA: II  Anesthesia Plan: General   Post-op Pain Management:    Induction: Intravenous  Airway Management Planned: Oral ETT  Additional Equipment:   Intra-op Plan:   Post-operative Plan: Extubation in OR  Informed Consent: I have reviewed the patients History and Physical, chart, labs and discussed the procedure including the risks, benefits and alternatives for the proposed anesthesia with the patient or authorized representative who has indicated his/her understanding and acceptance.   Dental advisory given  Plan Discussed with: Anesthesiologist  Anesthesia Plan Comments:         Anesthesia Quick Evaluation

## 2013-10-27 NOTE — Progress Notes (Signed)
After arrival to the unit the patient was connected to an IV pole with fluids running lactated ringers at 85 ml/hr. Shortly after connection to the IV pole the patients pump began to beep with a message flashing across the pole of "occluded patient side". The RN went into the patients room several times to restart the IV fluids. The patient at one point shared with the nurse that when she was in the PACU they had her IV tubing taped to her hand which had since came un-taped and she thought that was contributing to the IV pole beeping constantly. The RN went to get paper tape from the supply room and taped the IV tubing to the patients hand. Shortly thereafter the patients IV pole began to beep again. The RN went in the room to restart the IV pole and the patient shared with the RN that there was an air bubble in the IV tubing located near the catheter site and that she thought that the air bubble was contributing to the IV pole constantly beeping. The RN went to get a 10 cc saline flush out of the medication room. The RN disconnected the IV tubing from the patients site and began to push the saline into the patients IV port when the patient screamed and yelled that "it was hurting". The RN stopped pushing the saline. The RN looked at the syringe and noted that she had pushed a total of 1 cc of saline into the IV port at the time of the occurrence. The patients visitor a Caucasian female was in the room with her at the time. The patients fiancee had recently stepped out of the room. The RN asked the patient if she was ok and apologized for the patient having to experience pain. The patient was tearful and in pain at this point. The patients female visitor stated that something "bad had happened" and she wanted to know what it was. The RN explained to the patient and the patients visitor that this situation had never occurred with her and that she would not push anymore saline into the patients IV port. The patient  was still upset and tearful and the patients female visitor was still unsettled. The RN called the charge nurse to the patients room to help rectify the situation. The charge nurse came into the room and assessed the IV port. There was no IV infiltration, redness, bruising or swelling at the site. The charge nurse did not find a significant rationale as to why the 1 cc saline push was causing the patient pain. The charge nurse asked the RN to push the remaining 9 cc's into the patients IV port. The RN pushed the remaining 9 cc's into the patients port successfully with no pain or further complications. The RN reconnected the patient back to the IV pole and restarted her IV fluids at 85 ml/hr. RN will continue to assess the patient for pain and comfort measures.

## 2013-10-27 NOTE — Brief Op Note (Signed)
10/27/2013  2:37 PM  PATIENT:  [name]  53 y.o. female  PRE-OPERATIVE DIAGNOSIS:  CERVICAL SPONDYLOTIC RADICULOPATHY   POST-OPERATIVE DIAGNOSIS:  CERVICAL SPONDYLOTIC RADICULOPATHY   PROCEDURE:  Procedure(s): ACDF/ANTERIOR CERVICAL DECOMPRESSION/DISCECTOMY FUSION  C5-C7  (2 LEVELS) (N/A)  SURGEON:  Surgeon(s) and Role:    * Melina Schools, MD - Primary  PHYSICIAN ASSISTANT:   ASSISTANTS: none   ANESTHESIA:   general  EBL:  Total I/O In: 2000 [I.V.:2000] Out: -   BLOOD ADMINISTERED:none  DRAINS: none   LOCAL MEDICATIONS USED:  MARCAINE     SPECIMEN:  No Specimen  DISPOSITION OF SPECIMEN:  N/A  COUNTS:  YES  TOURNIQUET:  * No tourniquets in log *  DICTATION: .Other Dictation: Dictation Number [id number]  PLAN OF CARE: Admit for overnight observation  PATIENT DISPOSITION:  PACU - hemodynamically stable.

## 2013-10-28 MED ORDER — DOCUSATE SODIUM 100 MG PO CAPS: 100.0000 mg | ORAL_CAPSULE | Freq: Two times a day (BID) | ORAL | Status: DC

## 2013-10-28 MED ORDER — POLYETHYLENE GLYCOL 3350 17 G PO PACK: 17.0000 g | PACK | Freq: Every day | ORAL | Status: DC

## 2013-10-28 MED ORDER — OXYCODONE-ACETAMINOPHEN 10-325 MG PO TABS: 1.0000 | ORAL_TABLET | Freq: Four times a day (QID) | ORAL | Status: DC | PRN

## 2013-10-28 MED ORDER — ONDANSETRON 4 MG PO TBDP: 4.0000 mg | ORAL_TABLET | Freq: Three times a day (TID) | ORAL | Status: DC | PRN

## 2013-10-28 MED ORDER — METHOCARBAMOL 500 MG PO TABS: 500.0000 mg | ORAL_TABLET | Freq: Four times a day (QID) | ORAL | Status: DC | PRN

## 2013-10-28 NOTE — Progress Notes (Signed)
CARE MANAGEMENT NOTE 10/28/2013  Patient:  [name], [name]   Account Number:  [id number]  Date Initiated:  10/28/2013  Documentation initiated by:  [facility]  Subjective/Objective Assessment:   admitted postop ACDF C5-6, 6-7     Action/Plan:   OT eval-no needs identified   Anticipated DC Date:  10/28/2013   Anticipated DC Plan:  [place]  CM consult      Choice offered to / List presented to:             Status of service:  Completed, signed off Medicare Important Message given?   (If response is "NO", the following Medicare IM given date fields will be blank) Date Medicare IM given:   Date Additional Medicare IM given:    Discharge Disposition:  HOME/SELF CARE  Per UR Regulation:    If discussed at Long Length of Stay Meetings, dates discussed:    Comments:

## 2013-10-28 NOTE — Progress Notes (Signed)
Patient rested throughout the night. Patients fiancee spent the night. Patients pain was managed throughout the night.

## 2013-10-28 NOTE — Evaluation (Signed)
Occupational Therapy Evaluation and Discharge Patient Details Name: [name] MRN: [id number] DOB: [birth year] Today's Date: 10/28/2013    History of Present Illness Anterior cervical diskectomy and fusion, C5-6, C6-7   Clinical Impression   This 53 yo female admitted and underwent above presents to acute OT at an Independent/Mod I level. No further OT needs, we will sign off.    Follow Up Recommendations  No OT follow up    Equipment Recommendations  None recommended by OT       Precautions / Restrictions Precautions Precautions: Cervical Required Braces or Orthoses: Cervical Brace Cervical Brace: Hard collar (per pt she can have it off for bathing/dressing, eating, when in bed or when seated) Restrictions Weight Bearing Restrictions: No      Mobility Bed Mobility Overal bed mobility: Modified Independent                Transfers Overall transfer level: Independent                         ADL                                         General ADL Comments: Mod I for all due to cervical precautions and collar               Pertinent Vitals/Pain 5/10 in neck; pre-medicated per pt     Hand Dominance Right   Extremity/Trunk Assessment Upper Extremity Assessment Upper Extremity Assessment: Overall WFL for tasks assessed           Communication Communication Communication: No difficulties   Cognition Arousal/Alertness: Awake/alert Behavior During Therapy: WFL for tasks assessed/performed Overall Cognitive Status: Within Functional Limits for tasks assessed                                Home Living Family/patient expects to be discharged to:: Private residence Living Arrangements: Spouse/significant other Available Help at Discharge: Family Type of Home: House Home Access: Stairs to enter Technical brewer of Steps: 1 Entrance Stairs-Rails: None Home Layout: One level     Bathroom  Shower/Tub: Tub/shower unit;Curtain Shower/tub characteristics: Architectural technologist: Standard     Home Equipment: None          Prior Functioning/Environment Level of Independence: Independent                      OT Goals(Current goals can be found in the care plan section) Acute Rehab OT Goals Patient Stated Goal: Home today  OT Frequency:                End of Session Equipment Utilized During Treatment: Cervical collar Nurse Communication:  (Pt ready to go from therapy standpoint)  Activity Tolerance: Patient tolerated treatment well Patient left:  (sitting EOB)   Time: 6629-4765 OT Time Calculation (min): 31 min Charges:  OT General Charges $OT Visit: 1 Procedure OT Evaluation $Initial OT Evaluation Tier I: 1 Procedure OT Treatments $Self Care/Home Management : 23-37 mins G-Codes: OT G-codes **NOT FOR INPATIENT CLASS** Functional Assessment Tool Used: Clincal observation Functional Limitation: Self care Self Care Current Status (Y6503): At least 1 percent but less than 20 percent impaired, limited or restricted Self Care Goal Status (T4656): At least 1 percent but less than 20  percent impaired, limited or restricted Self Care Discharge Status [phone]): At least 1 percent but less than 20 percent impaired, limited or restricted  Almon Register [phone] 10/28/2013, 9:17 AM

## 2013-10-29 ENCOUNTER — Encounter (HOSPITAL_COMMUNITY): Payer: Self-pay | Admitting: Orthopedic Surgery

## 2013-11-18 NOTE — Discharge Summary (Signed)
Patient ID: [name] MRN: [id number] DOB/AGE: [birth year] 53 y.o.  Admit date: 10/27/2013 Discharge date: 11/18/2013  Admission Diagnoses:  Active Problems:   Neck pain   Discharge Diagnoses:  Active Problems:   Neck pain  status post Procedure(s): ACDF/ANTERIOR CERVICAL DECOMPRESSION/DISCECTOMY FUSION  C5-C7  (2 LEVELS)  Past Medical History  Diagnosis Date  . GERD (gastroesophageal reflux disease)   . Kidney stones   . Dyslipidemia   . Migraines   . Vitamin D deficiency   . Fibromyalgia   . Guillain-Barre syndrome   . TMJ (dislocation of temporomandibular joint)   . PONV (postoperative nausea and vomiting)   . Family history of anesthesia complication     patients mother has severe nausea and vomiting after  . UTI (urinary tract infection)     hx of  . Arthritis     right hand    Surgeries: Procedure(s): ACDF/ANTERIOR CERVICAL DECOMPRESSION/DISCECTOMY FUSION  C5-C7  (2 LEVELS) on 10/27/2013   Consultants:    Discharged Condition: Improved  Hospital Course: [name] is an 53 y.o. female who was admitted 10/27/2013 for operative treatment of cervical stenosis/ddd  Patient failed conservative treatments (please see the history and physical for the specifics) and had severe unremitting pain that affects sleep, daily activities and work/hobbies. After pre-op clearance, the patient was taken to the operating room on 10/27/2013 and underwent  Procedure(s): ACDF/ANTERIOR CERVICAL DECOMPRESSION/DISCECTOMY FUSION  C5-C7  (2 LEVELS).    Patient was given perioperative antibiotics:  Anti-infectives   Start     Dose/Rate Route Frequency Ordered Stop   10/27/13 2100  ceFAZolin (ANCEF) IVPB 1 g/50 mL premix     1 g 100 mL/hr over 30 Minutes Intravenous Every 8 hours 10/27/13 2050 10/28/13 0544   10/27/13 0600  ceFAZolin (ANCEF) IVPB 2 g/50 mL premix     2 g 100 mL/hr over 30 Minutes Intravenous 30 min pre-op 10/26/13 1416 10/27/13 1148       Patient was  given sequential compression devices and early ambulation to prevent DVT.   Patient benefited maximally from hospital stay and there were no complications. At the time of discharge, the patient was urinating/moving their bowels without difficulty, tolerating a regular diet, pain is controlled with oral pain medications and they have been cleared by PT/OT.   Recent vital signs: No data found.    Recent laboratory studies: No results found for this basename: WBC, HGB, HCT, PLT, NA, K, CL, CO2, BUN, CREATININE, GLUCOSE, PT, INR, CALCIUM, 2,  in the last 72 hours   Discharge Medications:     Medication List    STOP taking these medications       HYDROcodone-acetaminophen 5-325 MG per tablet  Commonly known as:  NORCO/VICODIN     MULTIVITAMIN PO     VITAMIN E PO      TAKE these medications       ALPRAZolam 0.25 MG tablet  Commonly known as:  XANAX  Take 0.25 mg by mouth 2 (two) times daily as needed for anxiety.     docusate sodium 100 MG capsule  Commonly known as:  COLACE  Take 1 capsule (100 mg total) by mouth 2 (two) times daily.     EPINEPHrine 0.3 mg/0.3 mL Devi  Commonly known as:  EPI-PEN  Inject 0.3 mg into the muscle once.     famotidine 20 MG tablet  Commonly known as:  PEPCID  Take 20 mg by mouth daily.  loratadine 10 MG tablet  Commonly known as:  CLARITIN  Take 10 mg by mouth daily.     methocarbamol 500 MG tablet  Commonly known as:  ROBAXIN  Take 1 tablet (500 mg total) by mouth every 6 (six) hours as needed for muscle spasms.     Milnacipran 50 MG Tabs tablet  Commonly known as:  SAVELLA  Take 1 tablet (50 mg total) by mouth 2 (two) times daily.     ondansetron 4 MG disintegrating tablet  Commonly known as:  ZOFRAN ODT  Take 1 tablet (4 mg total) by mouth every 8 (eight) hours as needed.     oxyCODONE-acetaminophen 10-325 MG per tablet  Commonly known as:  PERCOCET  Take 1 tablet by mouth every 6 (six) hours as needed for pain.      polyethylene glycol packet  Commonly known as:  MIRALAX / GLYCOLAX  Take 17 g by mouth daily.     pravastatin 40 MG tablet  Commonly known as:  PRAVACHOL  Take 1 tablet (40 mg total) by mouth daily.     SUMAtriptan 100 MG tablet  Commonly known as:  IMITREX  Take 1 tablet (100 mg total) by mouth every 2 (two) hours as needed for migraine.     valACYclovir 1000 MG tablet  Commonly known as:  VALTREX  Take 1 tablet (1,000 mg total) by mouth daily.     varenicline 0.5 MG X 11 & 1 MG X 42 tablet  Commonly known as:  CHANTIX PAK  Take 0.5 mg by mouth 2 (two) times daily. Take one 0.5 mg tablet by mouth once daily for 3 days, then increase to one 0.5 mg tablet twice daily for 4 days, then increase to one 1 mg tablet twice daily.     zolpidem 10 MG tablet  Commonly known as:  AMBIEN  Take 1 tablet (10 mg total) by mouth at bedtime as needed for sleep.        Diagnostic Studies: Dg Cervical Spine 2 Or 3 Views  10/27/2013   CLINICAL DATA:  Post ACDF  EXAM: CERVICAL SPINE - 2-3 VIEW  COMPARISON:  Intraoperative imaging same date, DG C-ARM 1-60 MIN dated 10/27/2013; DG CERVICAL SPINE 2-3 VIEWS dated 10/21/2013  FINDINGS: Evidence of C5-7 ACDF noted. No evidence for hardware failure. No fracture line identified. Lung apices are grossly clear.  IMPRESSION: C5-7 ACDF.   Electronically Signed   By: Conchita Paris M.D.   On: 10/27/2013 17:01   Dg Cervical Spine 2-3 Views  10/27/2013   CLINICAL DATA:  Neck pain.  EXAM: DG C-ARM 61-120 MIN; CERVICAL SPINE - 2-3 VIEW  FLUOROSCOPY TIME:  Provided in the report by the operating surgeon.  COMPARISON:  10/11/2003 MRI  FINDINGS: C-arm films document C5-7 ACDF. Satisfactory position and alignment.  IMPRESSION: As above.   Electronically Signed   By: Rolla Flatten M.D.   On: 10/27/2013 15:49   Dg Cervical Spine 2 Or 3 Views  10/21/2013   CLINICAL DATA:  Cervical degenerative disc disease and spondylosis. Preoperative examination.  EXAM: CERVICAL SPINE - 2-3  VIEW  COMPARISON:  Cervical spine x-rays and cervical spine MRI 10/11/2003.  FINDINGS: Slight straightening of the usual cervical lordosis. Anatomic posterior alignment. No visible fractures. Disc space narrowing and endplate hypertrophic changes at C4-5, C5-6, and C6-7, worst at C5-6. Normal prevertebral soft tissues. Foreign body overlying the low neck and shoulders on the AP image, likely a neck wrap.  The cervical levels were numbered  on both images for Dr. Rolena Infante.  IMPRESSION: Degenerative disc disease and spondylosis at C4-5, C5-6 and C6-7, greatest at C5-6.   Electronically Signed   By: Evangeline Dakin M.D.   On: 10/21/2013 15:05   Dg C-arm 1-60 Min  10/27/2013   CLINICAL DATA:  Neck pain.  EXAM: DG C-ARM 61-120 MIN; CERVICAL SPINE - 2-3 VIEW  FLUOROSCOPY TIME:  Provided in the report by the operating surgeon.  COMPARISON:  10/11/2003 MRI  FINDINGS: C-arm films document C5-7 ACDF. Satisfactory position and alignment.  IMPRESSION: As above.   Electronically Signed   By: Rolla Flatten M.D.   On: 10/27/2013 15:49        Discharge Plan:  discharge to home   Disposition:     Signed: Lanae Crumbly for Dr. Melina Schools [facility] [phone] 11/18/2013, 8:52 AM

## 2013-11-22 NOTE — Discharge Summary (Signed)
Agree with above Plan as discussed

## 2013-12-03 NOTE — OR Nursing (Signed)
Addendum to scope page 

## 2013-12-13 ENCOUNTER — Encounter: Payer: Self-pay | Admitting: Nurse Practitioner

## 2013-12-13 ENCOUNTER — Other Ambulatory Visit: Payer: Self-pay | Admitting: Nurse Practitioner

## 2013-12-13 MED ORDER — NITROFURANTOIN MONOHYD MACRO 100 MG PO CAPS: 100.0000 mg | ORAL_CAPSULE | Freq: Two times a day (BID) | ORAL | Status: DC

## 2013-12-23 ENCOUNTER — Telehealth: Payer: Self-pay | Admitting: Nurse Practitioner

## 2013-12-23 NOTE — Telephone Encounter (Signed)
appt scheduled

## 2013-12-27 ENCOUNTER — Ambulatory Visit (INDEPENDENT_AMBULATORY_CARE_PROVIDER_SITE_OTHER): Payer: Managed Care, Other (non HMO) | Admitting: Nurse Practitioner

## 2013-12-27 ENCOUNTER — Encounter: Payer: Self-pay | Admitting: Nurse Practitioner

## 2013-12-27 VITALS — BP 118/72 | HR 78 | Temp 97.8°F | Ht 69.0 in | Wt 148.0 lb

## 2013-12-27 DIAGNOSIS — IMO0001 Reserved for inherently not codable concepts without codable children: Secondary | ICD-10-CM

## 2013-12-27 DIAGNOSIS — E785 Hyperlipidemia, unspecified: Secondary | ICD-10-CM

## 2013-12-27 DIAGNOSIS — M797 Fibromyalgia: Secondary | ICD-10-CM

## 2013-12-27 DIAGNOSIS — Z13820 Encounter for screening for osteoporosis: Secondary | ICD-10-CM

## 2013-12-27 DIAGNOSIS — S0300XS Dislocation of jaw, unspecified side, sequela: Secondary | ICD-10-CM

## 2013-12-27 DIAGNOSIS — IMO0002 Reserved for concepts with insufficient information to code with codable children: Secondary | ICD-10-CM

## 2013-12-27 DIAGNOSIS — K219 Gastro-esophageal reflux disease without esophagitis: Secondary | ICD-10-CM

## 2013-12-27 DIAGNOSIS — N390 Urinary tract infection, site not specified: Secondary | ICD-10-CM

## 2013-12-27 LAB — POCT URINALYSIS DIPSTICK
BILIRUBIN UA: NEGATIVE
GLUCOSE UA: NEGATIVE
Ketones, UA: NEGATIVE
Nitrite, UA: NEGATIVE
SPEC GRAV UA: 1.01
Urobilinogen, UA: NEGATIVE
pH, UA: 7

## 2013-12-27 LAB — POCT UA - MICROSCOPIC ONLY
Casts, Ur, LPF, POC: NEGATIVE
Crystals, Ur, HPF, POC: NEGATIVE
MUCUS UA: NEGATIVE
Yeast, UA: NEGATIVE

## 2013-12-27 MED ORDER — PANTOPRAZOLE SODIUM 40 MG PO TBEC: 40.0000 mg | DELAYED_RELEASE_TABLET | Freq: Every day | ORAL | Status: DC

## 2013-12-27 MED ORDER — HYDROCODONE-ACETAMINOPHEN 5-325 MG PO TABS: 1.0000 | ORAL_TABLET | Freq: Four times a day (QID) | ORAL | Status: DC | PRN

## 2013-12-27 NOTE — Patient Instructions (Signed)

## 2013-12-27 NOTE — Progress Notes (Signed)
Subjective:    Patient ID: [name], female    DOB: [birth year], 53 y.o.   MRN: [id number]  Patient here today for follow up of chronic medical problems. Patient hd anterior neck fusion since she was last here and her neck pain has  Greatly improved.   Hyperlipidemia This is a chronic problem. The current episode started more than 1 year ago. The problem is controlled. Recent lipid tests were reviewed and are variable. There are no known factors aggravating her hyperlipidemia. Associated symptoms include leg pain and myalgias. Pertinent negatives include no focal sensory loss. Current antihyperlipidemic treatment includes statins. The current treatment provides moderate improvement of lipids. Compliance problems include adherence to diet.   Gastrophageal Reflux She complains of belching and heartburn. This is a recurrent problem. The current episode started more than 1 month ago. The problem occurs frequently. The problem has been waxing and waning. The heartburn duration is an hour. The heartburn is located in the substernum. The heartburn is of mild intensity. The heartburn wakes her from sleep. The heartburn does not limit her activity. The heartburn doesn't change with position. The symptoms are aggravated by caffeine and lying down. Associated symptoms include fatigue. She has tried nothing for the symptoms. The treatment provided no relief.  Fibromylagia Patient currently on savella and norco- doing ok- Has lost weight which has helped with some of her pain. Insomnia ambien working well feels rested in AM.    Review of Systems  Constitutional: Positive for fatigue.  Respiratory: Negative.   Cardiovascular: Negative.   Gastrointestinal: Positive for heartburn.  Genitourinary: Negative.   Musculoskeletal: Positive for myalgias.  All other systems reviewed and are negative.      Objective:   Physical Exam  Constitutional: She is oriented to person, place, and time. She  appears well-developed and well-nourished.  HENT:  Nose: Nose normal.  Mouth/Throat: Oropharynx is clear and moist.  Eyes: EOM are normal.  Neck: Trachea normal, normal range of motion and full passive range of motion without pain. Neck supple. No JVD present. Carotid bruit is not present. No thyromegaly present.  Cardiovascular: Normal rate, regular rhythm, normal heart sounds and intact distal pulses.  Exam reveals no gallop and no friction rub.   No murmur heard. Pulmonary/Chest: Effort normal and breath sounds normal.  Abdominal: Soft. Bowel sounds are normal. She exhibits no distension and no mass. There is no tenderness.  Musculoskeletal: Normal range of motion.  Lymphadenopathy:    She has no cervical adenopathy.  Neurological: She is alert and oriented to person, place, and time. She has normal reflexes.  Skin: Skin is warm and dry.  Psychiatric: She has a normal mood and affect. Her behavior is normal. Judgment and thought content normal.    BP 118/72  Pulse 78  Temp(Src) 97.8 F (36.6 C) (Oral)  Ht 5' 9" (1.753 m)  Wt 148 lb (67.132 kg)  BMI 21.85 kg/m2       Assessment & Plan:   1. Fibromyalgia   2. Gastroesophageal reflux disease, esophagitis presence not specified   3. Hyperlipidemia   4. UTI (lower urinary tract infection)   5. TMJ (dislocation of temporomandibular joint), sequela   6. Screening for osteoporosis    Orders Placed This Encounter  Procedures  . DG Bone Density    Standing Status: Future     Number of Occurrences:      Standing Expiration Date: 02/26/2015    Order Specific Question:  Reason for Exam (SYMPTOM  OR  DIAGNOSIS REQUIRED)    Answer:  screening    Order Specific Question:  Is the patient pregnant?    Answer:  No    Order Specific Question:  Preferred imaging location?    Answer:  Internal  . CMP14+EGFR  . NMR, lipoprofile  . POCT UA - Microscopic Only  . POCT urinalysis dipstick   Meds ordered this encounter  Medications  .  HYDROcodone-acetaminophen (NORCO/VICODIN) 5-325 MG per tablet    Sig: Take 1 tablet by mouth every 6 (six) hours as needed for moderate pain.  . pantoprazole (PROTONIX) 40 MG tablet    Sig: Take 1 tablet (40 mg total) by mouth daily.    Dispense:  30 tablet    Refill:  5    Order Specific Question:  Supervising Provider    Answer:  Chipper Herb [[id number]]    Labs pending Health maintenance reviewed Diet and exercise encouraged Continue all meds Follow up  In 6 months   [place], FNP

## 2013-12-28 ENCOUNTER — Other Ambulatory Visit: Payer: Self-pay | Admitting: Nurse Practitioner

## 2013-12-28 LAB — NMR, LIPOPROFILE

## 2013-12-28 LAB — CMP14+EGFR
ALBUMIN: 4.5 g/dL (ref 3.5–5.5)
ALK PHOS: 82 IU/L (ref 39–117)
ALT: 27 IU/L (ref 0–32)
AST: 24 IU/L (ref 0–40)
Albumin/Globulin Ratio: 2 (ref 1.1–2.5)
BILIRUBIN TOTAL: 0.3 mg/dL (ref 0.0–1.2)
BUN / CREAT RATIO: 19 (ref 9–23)
BUN: 13 mg/dL (ref 6–24)
CO2: 25 mmol/L (ref 18–29)
CREATININE: 0.68 mg/dL (ref 0.57–1.00)
Calcium: 10 mg/dL (ref 8.7–10.2)
Chloride: 100 mmol/L (ref 97–108)
GFR calc non Af Amer: 99 mL/min/{1.73_m2} (ref >59)
GFR, EST AFRICAN AMERICAN: 114 mL/min/{1.73_m2} (ref >59)
GLOBULIN, TOTAL: 2.2 g/dL (ref 1.5–4.5)
Glucose: 89 mg/dL (ref 65–99)
Potassium: 4.2 mmol/L (ref 3.5–5.2)
Sodium: 141 mmol/L (ref 134–144)
Total Protein: 6.7 g/dL (ref 6.0–8.5)

## 2013-12-28 LAB — SPECIMEN STATUS REPORT

## 2013-12-28 MED ORDER — NITROFURANTOIN MONOHYD MACRO 100 MG PO CAPS: 100.0000 mg | ORAL_CAPSULE | Freq: Two times a day (BID) | ORAL | Status: DC

## 2013-12-29 LAB — LIPID PANEL WITH LDL/HDL RATIO
Cholesterol, Total: 141 mg/dL (ref 100–199)
HDL: 47 mg/dL (ref >39)
LDL Calculated: 60 mg/dL (ref 0–99)
LDl/HDL Ratio: 1.3 ratio units (ref 0.0–3.2)
TRIGLYCERIDES: 171 mg/dL — AB (ref 0–149)
VLDL Cholesterol Cal: 34 mg/dL (ref 5–40)

## 2013-12-29 LAB — SPECIMEN STATUS REPORT

## 2014-03-16 ENCOUNTER — Other Ambulatory Visit: Payer: Self-pay | Admitting: *Deleted

## 2014-03-16 MED ORDER — PANTOPRAZOLE SODIUM 40 MG PO TBEC: 40.0000 mg | DELAYED_RELEASE_TABLET | Freq: Every day | ORAL | Status: DC

## 2014-03-21 ENCOUNTER — Other Ambulatory Visit: Payer: Self-pay | Admitting: Nurse Practitioner

## 2014-03-21 MED ORDER — HYDROCODONE-ACETAMINOPHEN 5-325 MG PO TABS: 1.0000 | ORAL_TABLET | Freq: Four times a day (QID) | ORAL | Status: DC | PRN

## 2014-03-21 MED ORDER — PANTOPRAZOLE SODIUM 40 MG PO TBEC: 40.0000 mg | DELAYED_RELEASE_TABLET | Freq: Every day | ORAL | Status: DC

## 2014-03-21 NOTE — Progress Notes (Signed)
Patient aware.

## 2014-03-30 ENCOUNTER — Ambulatory Visit: Payer: Managed Care, Other (non HMO)

## 2014-03-30 ENCOUNTER — Other Ambulatory Visit: Payer: Managed Care, Other (non HMO)

## 2014-05-25 ENCOUNTER — Encounter: Payer: Self-pay | Admitting: Nurse Practitioner

## 2014-05-25 ENCOUNTER — Ambulatory Visit (INDEPENDENT_AMBULATORY_CARE_PROVIDER_SITE_OTHER): Payer: 59 | Admitting: Nurse Practitioner

## 2014-05-25 VITALS — BP 160/89 | HR 100 | Temp 98.3°F | Ht 69.0 in | Wt 148.0 lb

## 2014-05-25 DIAGNOSIS — F411 Generalized anxiety disorder: Secondary | ICD-10-CM | POA: Insufficient documentation

## 2014-05-25 DIAGNOSIS — F329 Major depressive disorder, single episode, unspecified: Secondary | ICD-10-CM

## 2014-05-25 DIAGNOSIS — F32A Depression, unspecified: Secondary | ICD-10-CM

## 2014-05-25 DIAGNOSIS — G4700 Insomnia, unspecified: Secondary | ICD-10-CM

## 2014-05-25 DIAGNOSIS — M797 Fibromyalgia: Secondary | ICD-10-CM

## 2014-05-25 MED ORDER — ALPRAZOLAM 0.25 MG PO TABS: 0.2500 mg | ORAL_TABLET | Freq: Two times a day (BID) | ORAL | Status: DC | PRN

## 2014-05-25 MED ORDER — ESCITALOPRAM OXALATE 20 MG PO TABS: 20.0000 mg | ORAL_TABLET | Freq: Every day | ORAL | Status: DC

## 2014-05-25 MED ORDER — HYDROCODONE-ACETAMINOPHEN 5-325 MG PO TABS: 1.0000 | ORAL_TABLET | Freq: Four times a day (QID) | ORAL | Status: DC | PRN

## 2014-05-25 MED ORDER — ZOLPIDEM TARTRATE 10 MG PO TABS: 10.0000 mg | ORAL_TABLET | Freq: Every evening | ORAL | Status: DC | PRN

## 2014-05-25 NOTE — Patient Instructions (Signed)
Stress and Stress Management Stress is a normal reaction to life events. It is what you feel when life demands more than you are used to or more than you can handle. Some stress can be useful. For example, the stress reaction can help you catch the last bus of the day, study for a test, or meet a deadline at work. But stress that occurs too often or for too long can cause problems. It can affect your emotional health and interfere with relationships and normal daily activities. Too much stress can weaken your immune system and increase your risk for physical illness. If you already have a medical problem, stress can make it worse. CAUSES  All sorts of life events may cause stress. An event that causes stress for one person may not be stressful for another person. Major life events commonly cause stress. These may be positive or negative. Examples include losing your job, moving into a new home, getting married, having a baby, or losing a loved one. Less obvious life events may also cause stress, especially if they occur day after day or in combination. Examples include working long hours, driving in traffic, caring for children, being in debt, or being in a difficult relationship. SIGNS AND SYMPTOMS Stress may cause emotional symptoms including, the following:  Anxiety. This is feeling worried, afraid, on edge, overwhelmed, or out of control.  Anger. This is feeling irritated or impatient.  Depression. This is feeling sad, down, helpless, or guilty.  Difficulty focusing, remembering, or making decisions. Stress may cause physical symptoms, including the following:   Aches and pains. These may affect your head, neck, back, stomach, or other areas of your body.  Tight muscles or clenched jaw.  Low energy or trouble sleeping. Stress may cause unhealthy behaviors, including the following:   Eating to feel better (overeating) or skipping meals.  Sleeping too little, too much, or both.  Working  too much or putting off tasks (procrastination).  Smoking, drinking alcohol, or using drugs to feel better. DIAGNOSIS  Stress is diagnosed through an assessment by your health care provider. Your health care provider will ask questions about your symptoms and any stressful life events.Your health care provider will also ask about your medical history and may order blood tests or other tests. Certain medical conditions and medicine can cause physical symptoms similar to stress. Mental illness can cause emotional symptoms and unhealthy behaviors similar to stress. Your health care provider may refer you to a mental health professional for further evaluation.  TREATMENT  Stress management is the recommended treatment for stress.The goals of stress management are reducing stressful life events and coping with stress in healthy ways.  Techniques for reducing stressful life events include the following:  Stress identification. Self-monitor for stress and identify what causes stress for you. These skills may help you to avoid some stressful events.  Time management. Set your priorities, keep a calendar of events, and learn to say "no." These tools can help you avoid making too many commitments. Techniques for coping with stress include the following:  Rethinking the problem. Try to think realistically about stressful events rather than ignoring them or overreacting. Try to find the positives in a stressful situation rather than focusing on the negatives.  Exercise. Physical exercise can release both physical and emotional tension. The key is to find a form of exercise you enjoy and do it regularly.  Relaxation techniques. These relax the body and mind. Examples include yoga, meditation, tai chi, biofeedback, deep  breathing, progressive muscle relaxation, listening to music, being out in nature, journaling, and other hobbies. Again, the key is to find one or more that you enjoy and can do  regularly.  Healthy lifestyle. Eat a balanced diet, get plenty of sleep, and do not smoke. Avoid using alcohol or drugs to relax.  Strong support network. Spend time with family, friends, or other people you enjoy being around.Express your feelings and talk things over with someone you trust. Counseling or talktherapy with a mental health professional may be helpful if you are having difficulty managing stress on your own. Medicine is typically not recommended for the treatment of stress.Talk to your health care provider if you think you need medicine for symptoms of stress. HOME CARE INSTRUCTIONS  Keep all follow-up visits as directed by your health care provider.  Take all medicines as directed by your health care provider. SEEK MEDICAL CARE IF:  Your symptoms get worse or you start having new symptoms.  You feel overwhelmed by your problems and can no longer manage them on your own. SEEK IMMEDIATE MEDICAL CARE IF:  You feel like hurting yourself or someone else. Document Released: 11/20/2000 Document Revised: 10/11/2013 Document Reviewed: 01/19/2013 Exit[employer] Patient Information 2015 [employer]. This information is not intended to replace advice given to you by your health care provider. Make sure you discuss any questions you have with your health care provider.  

## 2014-05-25 NOTE — Progress Notes (Signed)
   Subjective:    Patient ID: [name], female    DOB: Feb 08, 1961, 53 y.o.   MRN: [id number]  HPI Patient in today c/o stress level- Her new husband was recently diagnosed with a brain tumor- She is not handling it well and sh eneeds to get it together to take care of him.    Review of Systems  Constitutional: Negative.   HENT: Negative.   Respiratory: Negative.   Cardiovascular: Negative.   Genitourinary: Negative.   Neurological: Negative.   Psychiatric/Behavioral: The patient is nervous/anxious.   All other systems reviewed and are negative.      Objective:   Physical Exam  Constitutional: She is oriented to person, place, and time. She appears well-developed and well-nourished.  Cardiovascular: Normal rate, regular rhythm and normal heart sounds.   Pulmonary/Chest: Effort normal and breath sounds normal.  Neurological: She is alert and oriented to person, place, and time.  Skin: Skin is warm and dry.  Psychiatric: She has a normal mood and affect. Her behavior is normal. Judgment and thought content normal.   BP 160/89 mmHg  Pulse 100  Temp(Src) 98.3 F (36.8 C) (Oral)  Ht 5\' 9"  (1.753 m)  Wt 148 lb (67.132 kg)  BMI 21.85 kg/m2        Assessment & Plan:  1. Depression Stress management - escitalopram (LEXAPRO) 20 MG tablet; Take 1 tablet (20 mg total) by mouth daily.  Dispense: 30 tablet; Refill: 5  2. Fibromyalgia Exercise to keep muscles warm - HYDROcodone-acetaminophen (NORCO/VICODIN) 5-325 MG per tablet; Take 1 tablet by mouth every 6 (six) hours as needed for moderate pain.  Dispense: 60 tablet; Refill: 0  3. GAD (generalized anxiety disorder) - ALPRAZolam (XANAX) 0.25 MG tablet; Take 1 tablet (0.25 mg total) by mouth 2 (two) times daily as needed for anxiety.  Dispense: 90 tablet; Refill: 1  4. Insomnia Bedtime ritual - zolpidem (AMBIEN) 10 MG tablet; Take 1 tablet (10 mg total) by mouth at bedtime as needed for sleep.  Dispense: 30 tablet;  Refill: 3  Follow up in 1 month  [place], FNP

## 2014-06-15 ENCOUNTER — Other Ambulatory Visit: Payer: Self-pay | Admitting: Nurse Practitioner

## 2014-06-22 ENCOUNTER — Ambulatory Visit (INDEPENDENT_AMBULATORY_CARE_PROVIDER_SITE_OTHER): Payer: 59 | Admitting: Nurse Practitioner

## 2014-06-22 ENCOUNTER — Encounter: Payer: Self-pay | Admitting: Nurse Practitioner

## 2014-06-22 VITALS — BP 118/81 | HR 89 | Temp 97.6°F | Ht 69.0 in | Wt 165.0 lb

## 2014-06-22 DIAGNOSIS — F411 Generalized anxiety disorder: Secondary | ICD-10-CM

## 2014-06-22 DIAGNOSIS — M797 Fibromyalgia: Secondary | ICD-10-CM

## 2014-06-22 DIAGNOSIS — K219 Gastro-esophageal reflux disease without esophagitis: Secondary | ICD-10-CM

## 2014-06-22 DIAGNOSIS — E785 Hyperlipidemia, unspecified: Secondary | ICD-10-CM

## 2014-06-22 MED ORDER — HYDROCODONE-ACETAMINOPHEN 5-325 MG PO TABS: 1.0000 | ORAL_TABLET | Freq: Four times a day (QID) | ORAL | Status: DC | PRN

## 2014-06-22 MED ORDER — CYCLOBENZAPRINE HCL 10 MG PO TABS: 10.0000 mg | ORAL_TABLET | Freq: Three times a day (TID) | ORAL | Status: DC | PRN

## 2014-06-22 MED ORDER — METHYLPREDNISOLONE ACETATE 80 MG/ML IJ SUSP
80.0000 mg | Freq: Once | INTRAMUSCULAR | Status: AC
Start: 2014-06-22 — End: 2014-06-22
  Administered 2014-06-22: 80 mg via INTRAMUSCULAR

## 2014-06-22 NOTE — Progress Notes (Signed)
  Subjective:    Patient ID: [name], female    DOB: 04-08-1961, 54 y.o.   MRN: [id number]  Patient here today for follow up of chronic medical problems. Patient hd anterior neck fusion since she was last here and her neck pain has  Greatly improved.   Hyperlipidemia This is a chronic problem. The current episode started more than 1 year ago. The problem is controlled. Recent lipid tests were reviewed and are normal. Associated symptoms include myalgias. Current antihyperlipidemic treatment includes statins. The current treatment provides moderate improvement of lipids. Compliance problems include adherence to diet and adherence to exercise.  Risk factors for coronary artery disease include dyslipidemia and post-menopausal.  Gastrophageal Reflux This is a chronic problem. The problem occurs frequently. Associated symptoms include fatigue. She has tried a histamine-2 antagonist for the symptoms. The treatment provided moderate relief.  Fibromylagia  Patient currently on savella and norco- doing ok- Has lost weight which has helped with some of her pain. Would like a shot today. Insomnia ambien working well feels rested in AM.    Review of Systems  Constitutional: Positive for fatigue.  Respiratory: Negative.   Cardiovascular: Negative.   Genitourinary: Negative.   Musculoskeletal: Positive for myalgias.  All other systems reviewed and are negative.      Objective:   Physical Exam  Constitutional: She is oriented to person, place, and time. She appears well-developed and well-nourished.  HENT:  Nose: Nose normal.  Mouth/Throat: Oropharynx is clear and moist.  Eyes: EOM are normal.  Neck: Trachea normal, normal range of motion and full passive range of motion without pain. Neck supple. No JVD present. Carotid bruit is not present. No thyromegaly present.  Cardiovascular: Normal rate, regular rhythm, normal heart sounds and intact distal pulses.  Exam reveals no gallop and no  friction rub.   No murmur heard. Pulmonary/Chest: Effort normal and breath sounds normal.  Abdominal: Soft. Bowel sounds are normal. She exhibits no distension and no mass. There is no tenderness.  Musculoskeletal: Normal range of motion.  Lymphadenopathy:    She has no cervical adenopathy.  Neurological: She is alert and oriented to person, place, and time. She has normal reflexes.  Skin: Skin is warm and dry.  Psychiatric: She has a normal mood and affect. Her behavior is normal. Judgment and thought content normal.    BP 118/81 mmHg  Pulse 89  Temp(Src) 97.6 F (36.4 C) (Oral)  Ht 5' 9" (1.753 m)  Wt 165 lb (74.844 kg)  BMI 24.36 kg/m2       Assessment & Plan:    1. Fibromyalgia exercise - HYDROcodone-acetaminophen (NORCO/VICODIN) 5-325 MG per tablet; Take 1 tablet by mouth every 6 (six) hours as needed for moderate pain.  Dispense: 60 tablet; Refill: 0 - cyclobenzaprine (FLEXERIL) 10 MG tablet; Take 1 tablet (10 mg total) by mouth 3 (three) times daily as needed for muscle spasms.  Dispense: 30 tablet; Refill: 3 - methylPREDNISolone acetate (DEPO-MEDROL) injection 80 mg; Inject 1 mL (80 mg total) into the muscle once.  2. Gastroesophageal reflux disease, esophagitis presence not specified Watch spicy foods  3. GAD (generalized anxiety disorder) Stress management  4. Hyperlipidemia Watch fat in diet  Orders Placed This Encounter  Procedures  . CMP14+EGFR  . NMR, lipoprofile    Follow up in 6 months Labs pending RTO in 3 months  [place], FNP

## 2014-06-22 NOTE — Patient Instructions (Signed)
Fibromyalgia Fibromyalgia is a disorder that is often misunderstood. It is associated with muscular pains and tenderness that comes and goes. It is often associated with fatigue and sleep disturbances. Though it tends to be long-lasting, fibromyalgia is not life-threatening. CAUSES  The exact cause of fibromyalgia is unknown. People with certain gene types are predisposed to developing fibromyalgia and other conditions. Certain factors can play a role as triggers, such as:  Spine disorders.  Arthritis.  Severe injury (trauma) and other physical stressors.  Emotional stressors. SYMPTOMS   The main symptom is pain and stiffness in the muscles and joints, which can vary over time.  Sleep and fatigue problems. Other related symptoms may include:  Bowel and bladder problems.  Headaches.  Visual problems.  Problems with odors and noises.  Depression or mood changes.  Painful periods (dysmenorrhea).  Dryness of the skin or eyes. DIAGNOSIS  There are no specific tests for diagnosing fibromyalgia. Patients can be diagnosed accurately from the specific symptoms they have. The diagnosis is made by determining that nothing else is causing the problems. TREATMENT  There is no cure. Management includes medicines and an active, healthy lifestyle. The goal is to enhance physical fitness, decrease pain, and improve sleep. HOME CARE INSTRUCTIONS   Only take over-the-counter or prescription medicines as directed by your caregiver. Sleeping pills, tranquilizers, and pain medicines may make your problems worse.  Low-impact aerobic exercise is very important and advised for treatment. At first, it may seem to make pain worse. Gradually increasing your tolerance will overcome this feeling.  Learning relaxation techniques and how to control stress will help you. Biofeedback, visual imagery, hypnosis, muscle relaxation, yoga, and meditation are all options.  Anti-inflammatory medicines and  physical therapy may provide short-term help.  Acupuncture or massage treatments may help.  Take muscle relaxant medicines as suggested by your caregiver.  Avoid stressful situations.  Plan a healthy lifestyle. This includes your diet, sleep, rest, exercise, and friends.  Find and practice a hobby you enjoy.  Join a fibromyalgia support group for interaction, ideas, and sharing advice. This may be helpful. SEEK MEDICAL CARE IF:  You are not having good results or improvement from your treatment. FOR MORE INFORMATION  National Fibromyalgia Association: www.fmaware.org Arthritis Foundation: www.arthritis.org Document Released: 05/27/2005 Document Revised: 08/19/2011 Document Reviewed: 09/06/2009 ExitCare Patient Information 2015 [employer]. This information is not intended to replace advice given to you by your health care provider. Make sure you discuss any questions you have with your health care provider.  

## 2014-06-23 ENCOUNTER — Other Ambulatory Visit: Payer: Self-pay | Admitting: Nurse Practitioner

## 2014-07-04 ENCOUNTER — Ambulatory Visit: Payer: Managed Care, Other (non HMO) | Admitting: Nurse Practitioner

## 2014-07-06 ENCOUNTER — Other Ambulatory Visit: Payer: Managed Care, Other (non HMO)

## 2014-07-06 ENCOUNTER — Ambulatory Visit: Payer: Managed Care, Other (non HMO)

## 2014-07-21 ENCOUNTER — Other Ambulatory Visit: Payer: Self-pay | Admitting: Nurse Practitioner

## 2014-07-21 NOTE — Telephone Encounter (Signed)
Last seen 06/22/14 MMM

## 2014-08-19 ENCOUNTER — Other Ambulatory Visit: Payer: Self-pay | Admitting: Nurse Practitioner

## 2014-08-21 ENCOUNTER — Other Ambulatory Visit: Payer: Self-pay | Admitting: Nurse Practitioner

## 2014-09-14 ENCOUNTER — Ambulatory Visit (INDEPENDENT_AMBULATORY_CARE_PROVIDER_SITE_OTHER): Payer: 59

## 2014-09-14 ENCOUNTER — Ambulatory Visit (INDEPENDENT_AMBULATORY_CARE_PROVIDER_SITE_OTHER): Payer: 59 | Admitting: Nurse Practitioner

## 2014-09-14 ENCOUNTER — Encounter: Payer: Self-pay | Admitting: Nurse Practitioner

## 2014-09-14 VITALS — BP 110/76 | HR 98 | Temp 98.1°F | Ht 69.0 in | Wt 162.0 lb

## 2014-09-14 DIAGNOSIS — M797 Fibromyalgia: Secondary | ICD-10-CM | POA: Diagnosis not present

## 2014-09-14 DIAGNOSIS — R1310 Dysphagia, unspecified: Secondary | ICD-10-CM | POA: Diagnosis not present

## 2014-09-14 DIAGNOSIS — K5900 Constipation, unspecified: Secondary | ICD-10-CM | POA: Diagnosis not present

## 2014-09-14 DIAGNOSIS — R1030 Lower abdominal pain, unspecified: Secondary | ICD-10-CM

## 2014-09-14 MED ORDER — HYDROCODONE-ACETAMINOPHEN 5-325 MG PO TABS: 1.0000 | ORAL_TABLET | Freq: Four times a day (QID) | ORAL | Status: DC | PRN

## 2014-09-14 NOTE — Patient Instructions (Signed)

## 2014-09-14 NOTE — Progress Notes (Signed)
   Subjective:    Patient ID: [name], female    DOB: 30-Oct-1960, 54 y.o.   MRN: [id number]  HPI Patient in today c/o throat feeling full. Occasional trouble swallowing. Started several weeks ago and has gotten a little worse. Has had hyploi in the past, but symptoms do  Not feel the same. Is on protonix and has to take pepto bismol daily along with protonix. Says that she feels constipated all the time. Use to have bowel movement daily but now only goes every 3- days but nit much whne she does go.    Review of Systems  Constitutional: Negative.   HENT: Negative.   Respiratory: Negative.   Cardiovascular: Negative.   Gastrointestinal: Positive for nausea. Negative for vomiting, constipation and blood in stool.  Genitourinary: Negative.   Musculoskeletal: Negative.   Neurological: Negative.   Psychiatric/Behavioral: Negative.   All other systems reviewed and are negative.      Objective:   Physical Exam  Constitutional: She is oriented to person, place, and time. She appears well-developed and well-nourished.  Cardiovascular: Normal rate, regular rhythm and normal heart sounds.   Pulmonary/Chest: Effort normal and breath sounds normal.  Abdominal: Soft. She exhibits no mass. There is tenderness. There is guarding. There is no rebound.  Hypoactive bowel sounds  Neurological: She is alert and oriented to person, place, and time.  Skin: Skin is warm and dry.  Psychiatric: She has a normal mood and affect. Her behavior is normal. Judgment and thought content normal.    BP 110/76 mmHg  Pulse 98  Temp(Src) 98.1 F (36.7 C) (Oral)  Ht 5\' 9"  (1.753 m)  Wt 162 lb (73.483 kg)  BMI 23.91 kg/m2  KUB- moderate stool burden-Preliminary reading by Ronnald Collum, FNP  [facility]       Assessment & Plan:  1. Lower abdominal pain - DG Abd 1 View; Future  2. Constipation, unspecified constipation type miralax daily Force fluids RTO if not improving  3. Dysphagia Chew foods really  well - Ambulatory referral to Gastroenterology  Mary-Margaret Hassell Done, FNP

## 2014-09-14 NOTE — Addendum Note (Signed)
Addended by: Chevis Pretty on: 09/14/2014 09:54 AM   Modules accepted: Orders

## 2014-09-16 ENCOUNTER — Encounter (INDEPENDENT_AMBULATORY_CARE_PROVIDER_SITE_OTHER): Payer: Self-pay | Admitting: *Deleted

## 2014-09-16 ENCOUNTER — Telehealth: Payer: Self-pay | Admitting: Nurse Practitioner

## 2014-09-21 ENCOUNTER — Encounter: Payer: Self-pay | Admitting: Nurse Practitioner

## 2014-09-21 DIAGNOSIS — R1314 Dysphagia, pharyngoesophageal phase: Secondary | ICD-10-CM

## 2014-10-06 ENCOUNTER — Ambulatory Visit (INDEPENDENT_AMBULATORY_CARE_PROVIDER_SITE_OTHER): Payer: Managed Care, Other (non HMO) | Admitting: Internal Medicine

## 2014-10-12 ENCOUNTER — Other Ambulatory Visit (INDEPENDENT_AMBULATORY_CARE_PROVIDER_SITE_OTHER): Payer: 59

## 2014-10-12 ENCOUNTER — Ambulatory Visit (INDEPENDENT_AMBULATORY_CARE_PROVIDER_SITE_OTHER): Payer: 59 | Admitting: Nurse Practitioner

## 2014-10-12 ENCOUNTER — Encounter: Payer: Self-pay | Admitting: Nurse Practitioner

## 2014-10-12 VITALS — BP 112/72 | HR 86 | Resp 18 | Ht 69.0 in | Wt 163.6 lb

## 2014-10-12 DIAGNOSIS — R110 Nausea: Secondary | ICD-10-CM

## 2014-10-12 DIAGNOSIS — K5900 Constipation, unspecified: Secondary | ICD-10-CM

## 2014-10-12 DIAGNOSIS — R1310 Dysphagia, unspecified: Secondary | ICD-10-CM | POA: Diagnosis not present

## 2014-10-12 DIAGNOSIS — R195 Other fecal abnormalities: Secondary | ICD-10-CM | POA: Insufficient documentation

## 2014-10-12 LAB — CBC
HCT: 40.3 % (ref 36.0–46.0)
Hemoglobin: 13.9 g/dL (ref 12.0–15.0)
MCHC: 34.4 g/dL (ref 30.0–36.0)
MCV: 89 fl (ref 78.0–100.0)
PLATELETS: 212 10*3/uL (ref 150.0–400.0)
RBC: 4.52 Mil/uL (ref 3.87–5.11)
RDW: 13.7 % (ref 11.5–15.5)
WBC: 6.3 10*3/uL (ref 4.0–10.5)

## 2014-10-12 MED ORDER — ONDANSETRON HCL 4 MG PO TABS: 4.0000 mg | ORAL_TABLET | Freq: Three times a day (TID) | ORAL | Status: DC | PRN

## 2014-10-12 MED ORDER — LINACLOTIDE 290 MCG PO CAPS: 290.0000 ug | ORAL_CAPSULE | Freq: Every day | ORAL | Status: DC

## 2014-10-12 NOTE — Progress Notes (Signed)
Reviewed and agree with management plan.  Malcolm T. Stark, MD FACG 

## 2014-10-12 NOTE — Patient Instructions (Addendum)
Your physician has requested that you go to the basement for the following lab work before leaving today: [place] have been scheduled for an endoscopy. Please follow written instructions given to you at your visit today. If you use inhalers (even only as needed), please bring them with you on the day of your procedure.  You have been scheduled for a Barium Esophogram at [facility] (1st floor of the hospital) on 10-17-14 at 11:00am. Please arrive 15 minutes prior to your appointment for registration. Make certain not to have anything to eat or drink 3 hours prior to your test. If you need to reschedule for any reason, please contact radiology at [phone] to do so. __________________________________________________________________ A barium swallow is an examination that concentrates on views of the esophagus. This tends to be a double contrast exam (barium and two liquids which, when combined, create a gas to distend the wall of the oesophagus) or single contrast (non-ionic iodine based). The study is usually tailored to your symptoms so a good history is essential. Attention is paid during the study to the form, structure and configuration of the esophagus, looking for functional disorders (such as aspiration, dysphagia, achalasia, motility and reflux) EXAMINATION You may be asked to change into a gown, depending on the type of swallow being performed. A radiologist and radiographer will perform the procedure. The radiologist will advise you of the type of contrast selected for your procedure and direct you during the exam. You will be asked to stand, sit or lie in several different positions and to hold a small amount of fluid in your mouth before being asked to swallow while the imaging is performed .In some instances you may be asked to swallow barium coated marshmallows to assess the motility of a solid food bolus. The exam can be recorded as a digital or video fluoroscopy  procedure. POST PROCEDURE It will take 1-2 days for the barium to pass through your system. To facilitate this, it is important, unless otherwise directed, to increase your fluids for the next 24-48hrs and to resume your normal diet.  This test typically takes about 30 minutes to perform. __________________________________________________________________________________  We have sent the following medications to your pharmacy for you to pick up at your convenience: [facility]- If insurance will not cover then try increasing Miralax to twice a day

## 2014-10-12 NOTE — Progress Notes (Signed)
HPI :  Patient is a 54 year old female known remotely to Dr. Fuller Plan. She is referred by PCP. Patient has several gastrointestinal concerns. First, she has discomfort and fullness in her throat like someone has a hand around her neck.  Having occasional dysphagia to solids, especially meats. Breads can be problematic as well. Her problems swallowing started approximately one year ago following a cervical fusion. Patient is unsure if she has any hardware in her neck. She also complains of constant nausea over the last 8 weeks. She has been having black, tarry stools. No NSAID use. Patient has been on a PPI for at least one year. Though she has taken Pepto-Bismol,black stools have occurred independently of that.  No weight loss, weight is up. Patient also gives an 8 week history of constipation. She describes passage of hard stool followed by liquid stools. No medication changes or dietary changes. Patient takes hydrocodone but no more than 3 times a week for fibromyalgia.  Patient has been under significant stress, her husband was diagnosed with brain tumor in November. Apparently his prognosis is very poor.  Past Medical History  Diagnosis Date  . GERD (gastroesophageal reflux disease)   . Kidney stones   . Dyslipidemia   . Migraines   . Vitamin D deficiency   . Fibromyalgia   . Guillain-Barre syndrome   . TMJ (dislocation of temporomandibular joint)   . PONV (postoperative nausea and vomiting)   . Family history of anesthesia complication     patients mother has severe nausea and vomiting after  . UTI (urinary tract infection)     hx of  . Arthritis     right hand    Family History  Problem Relation Age of Onset  . Heart disease Mother   . Cancer Father     STOMACH CANCER  . Cancer Maternal Grandfather   . Diabetes Paternal Grandmother    History  Substance Use Topics  . Smoking status: Former Smoker -- 0.50 packs/day for 30 years  . Smokeless tobacco: Not on file  . Alcohol  Use: 0.6 oz/week    1 Cans of beer per week     Comment: rare   Current Outpatient Prescriptions  Medication Sig Dispense Refill  . ALPRAZolam (XANAX) 0.25 MG tablet Take 1 tablet (0.25 mg total) by mouth 2 (two) times daily as needed for anxiety. 90 tablet 1  . cyclobenzaprine (FLEXERIL) 10 MG tablet Take 1 tablet (10 mg total) by mouth 3 (three) times daily as needed for muscle spasms. 30 tablet 3  . docusate sodium (COLACE) 100 MG capsule Take 1 capsule (100 mg total) by mouth 2 (two) times daily. 50 capsule 0  . EPINEPHrine (EPI-PEN) 0.3 mg/0.3 mL DEVI Inject 0.3 mg into the muscle once.     . escitalopram (LEXAPRO) 20 MG tablet Take 1 tablet (20 mg total) by mouth daily. 30 tablet 5  . HYDROcodone-acetaminophen (NORCO/VICODIN) 5-325 MG per tablet Take 1 tablet by mouth every 6 (six) hours as needed for moderate pain. 60 tablet 0  . loratadine (CLARITIN) 10 MG tablet Take 10 mg by mouth daily.    . pantoprazole (PROTONIX) 40 MG tablet Take 1 tablet (40 mg total) by mouth daily. 90 tablet 1  . pravastatin (PRAVACHOL) 40 MG tablet TAKE 1 TABLET EVERY DAY 90 tablet 1  . SAVELLA 50 MG TABS tablet TAKE 1 TABLET TWICE A DAY 180 tablet 2  . SUMAtriptan (IMITREX) 100 MG tablet TAKE 1 TABLET EVERY 2 HOURS  AS NEEDED FOR MIGRAINES 12 tablet 2  . valACYclovir (VALTREX) 1000 MG tablet TAKE 1 TABLET EVERY DAY 90 tablet 2  . zolpidem (AMBIEN) 10 MG tablet Take 1 tablet (10 mg total) by mouth at bedtime as needed for sleep. 30 tablet 3   No current facility-administered medications for this visit.   Allergies  Allergen Reactions  . Other Anaphylaxis    Plastic bottles; patient drank from a water bottle and had to use Epi pen  . Peanuts [Peanut Oil] Anaphylaxis  . Statins Other (See Comments)    Severe joint pain  . Tape Other (See Comments)    Causes red blisters on skin. Can use paper tape    Review of Systems: All systems reviewed and negative except where noted in HPI.    Dg Abd 1  View  09/14/2014   CLINICAL DATA:  Pain.  EXAM: ABDOMEN - 1 VIEW  COMPARISON:  None.  FINDINGS: Soft tissue structures are unremarkable. Moderate amount of stool noted throughout the colon. No bowel distention or free air. Tiny punctate calcification noted over the left flank at the level of L5. This could represent phlebolith or tiny ureteral stone. Degenerative changes lumbar spine.  IMPRESSION: 1. Moderate amount of stool noted throughout the colon. No bowel distention.  2. Punctate calcific density noted over the left flank at the level of L5. This could represent tiny phlebolith or ureteral stone.   Electronically Signed   By: Marcello Moores  Register   On: 09/14/2014 10:20    Physical Exam: BP 112/72 mmHg  Pulse 86  Resp 18  Ht 5\' 9"  (1.753 m)  Wt 163 lb 9.6 oz (74.208 kg)  BMI 24.15 kg/m2 Constitutional: Pleasant,well-developed, white female in no acute distress. HEENT: Normocephalic and atraumatic. Conjunctivae are normal. No scleral icterus. Neck supple.  Cardiovascular: Normal rate, regular rhythm.  Pulmonary/chest: Effort normal and breath sounds normal. No wheezing, rales or rhonchi. Abdominal: Soft, nondistended, nontender. Bowel sounds active throughout. There are no masses palpable. No hepatomegaly. Rectal : Trace amount of light brown, heme-negative stool in vault Extremities: no edema Lymphadenopathy: No cervical adenopathy noted. Neurological: Alert and oriented to person place and time. Skin: Skin is warm and dry. No rashes noted. Psychiatric: Normal mood and affect. Behavior is normal.   ASSESSMENT AND PLAN:   32. 54 year old female with a one-year history of intermittent solid food dysphagia which started one year after cervical fusion. Patient is unsure if she has any plates or other hardware in her neck. Rule out dysmotility or stricture. Rule out postoperative complications. For further evaluation of dysphagia patient will be scheduled for a barium swallow with tablet. If  stricture found then we could proceed with esophageal dilation at time of EGD, see #2.  2. Nausea, reports of black tarry stool at home. No NSAID use, on chronic daily PPI. Will check CBC. Patient will be scheduled for upper endoscopy (following barium swallow).. The benefits, risks, and potential complications of EGD with possible biopsies were discussed with the patient and she agrees to proceed.  Trial of Zofran  3. Constipation. Symptoms started approximately 8 weeks ago. Patient describes hard stool often followed by liquid stool. She takes 3 colace a day and miralax at night. Will try Linzess. If insurance does not pay then patient will try increasing the MiraLAX to twice a day. Patient had a colonoscopy several years ago at outside facility, we are requesting report.   Cc: Chevis Pretty, FNP

## 2014-10-14 ENCOUNTER — Encounter: Payer: Self-pay | Admitting: Gastroenterology

## 2014-10-17 ENCOUNTER — Ambulatory Visit (HOSPITAL_COMMUNITY)
Admission: RE | Admit: 2014-10-17 | Discharge: 2014-10-17 | Disposition: A | Discharge status: Home / Self Care | Payer: 59 | Source: Ambulatory Visit | Attending: Nurse Practitioner | Admitting: Nurse Practitioner

## 2014-10-17 DIAGNOSIS — R110 Nausea: Secondary | ICD-10-CM | POA: Insufficient documentation

## 2014-10-17 DIAGNOSIS — R1310 Dysphagia, unspecified: Secondary | ICD-10-CM

## 2014-10-17 DIAGNOSIS — Z981 Arthrodesis status: Secondary | ICD-10-CM | POA: Diagnosis not present

## 2014-10-17 DIAGNOSIS — R079 Chest pain, unspecified: Secondary | ICD-10-CM | POA: Insufficient documentation

## 2014-10-17 DIAGNOSIS — R1319 Other dysphagia: Secondary | ICD-10-CM | POA: Diagnosis present

## 2014-10-17 DIAGNOSIS — R0989 Other specified symptoms and signs involving the circulatory and respiratory systems: Secondary | ICD-10-CM | POA: Diagnosis not present

## 2014-10-17 DIAGNOSIS — K5900 Constipation, unspecified: Secondary | ICD-10-CM

## 2014-10-20 ENCOUNTER — Other Ambulatory Visit: Payer: Self-pay | Admitting: Nurse Practitioner

## 2014-10-20 DIAGNOSIS — M797 Fibromyalgia: Secondary | ICD-10-CM

## 2014-10-20 MED ORDER — HYDROCODONE-ACETAMINOPHEN 5-325 MG PO TABS: 1.0000 | ORAL_TABLET | Freq: Four times a day (QID) | ORAL | Status: DC | PRN

## 2014-10-20 NOTE — Progress Notes (Signed)
Pt aware written Rx is at front office ready for pickup 

## 2014-10-20 NOTE — Telephone Encounter (Signed)
Please call in ambien with 1 refills 

## 2014-10-20 NOTE — Telephone Encounter (Signed)
Pt aware refill left on VM at [facility]. Pt also needs her Vicodin refilled

## 2014-10-28 ENCOUNTER — Encounter: Payer: Self-pay | Admitting: Gastroenterology

## 2014-10-28 ENCOUNTER — Ambulatory Visit (AMBULATORY_SURGERY_CENTER): Payer: 59 | Admitting: Gastroenterology

## 2014-10-28 VITALS — BP 133/77 | HR 66 | Temp 95.3°F | Resp 26 | Ht 69.0 in | Wt 163.0 lb

## 2014-10-28 DIAGNOSIS — K5900 Constipation, unspecified: Secondary | ICD-10-CM

## 2014-10-28 DIAGNOSIS — R1314 Dysphagia, pharyngoesophageal phase: Secondary | ICD-10-CM

## 2014-10-28 DIAGNOSIS — R1013 Epigastric pain: Secondary | ICD-10-CM

## 2014-10-28 DIAGNOSIS — K2100 Gastro-esophageal reflux disease with esophagitis, without bleeding: Secondary | ICD-10-CM

## 2014-10-28 MED ORDER — LINACLOTIDE 145 MCG PO CAPS
145.0000 ug | ORAL_CAPSULE | Freq: Every day | ORAL | Status: DC
Start: 2014-10-28 — End: 2015-04-13

## 2014-10-28 MED ORDER — PANTOPRAZOLE SODIUM 40 MG PO TBEC: 40.0000 mg | DELAYED_RELEASE_TABLET | Freq: Two times a day (BID) | ORAL | Status: DC

## 2014-10-28 MED ORDER — SODIUM CHLORIDE 0.9 % IV SOLN: 500.0000 mL | INTRAVENOUS | Status: DC

## 2014-10-28 NOTE — Op Note (Addendum)
[place]  [address] [place], [id number]   ENDOSCOPY PROCEDURE REPORT  PATIENT: [name], [name]  MR#: [id number] BIRTHDATE: [birth year] , 22  yrs. old GENDER: female ENDOSCOPIST: Ladene Artist, MD, [facility] REFERRED BY:  Breck Coons, N.P. PROCEDURE DATE:  10/28/2014 PROCEDURE:  EGD, diagnostic ASA CLASS:     Class II INDICATIONS:  dysphagia and epigastric pain. MEDICATIONS: Monitored anesthesia care and Propofol 120 mg IV TOPICAL ANESTHETIC: none DESCRIPTION OF PROCEDURE: After the risks benefits and alternatives of the procedure were thoroughly explained, informed consent was obtained.  The LB [id number] endoscope was introduced through the mouth and advanced to the second portion of the duodenum , Without limitations.  The instrument was slowly withdrawn as the mucosa was fully examined.    ESOPHAGUS: There was LA Class A esophagitis (One or more mucosal breaks < 5 mm in maximal length) noted.  The esophagus was otherwise normal. No stricture noted. STOMACH: The mucosa and folds of the stomach appeared normal. DUODENUM: The duodenal mucosa showed no abnormalities in the bulb and 2nd part of the duodenum.  Retroflexed views revealed a small hiatal hernia.   The scope was then withdrawn from the patient and the procedure completed.  COMPLICATIONS: There were no immediate complications.  ENDOSCOPIC IMPRESSION: 1.   LA Class A esophagitis 2.   Small hiatal hernia  RECOMMENDATIONS: 1.  Anti-reflux regimen 2.  Increase PPI to bid 3.  Linzess 145 mcg po daily, 1 year of refills 4.  Call officeto schedule an office appointment for 4-6 weeks  eSigned:  Ladene Artist, MD, [facility] 10/28/2014 2:19 PM Revised: 10/28/2014 2:19 PM

## 2014-10-28 NOTE — Patient Instructions (Signed)
YOU HAD AN ENDOSCOPIC PROCEDURE TODAY AT [facility]:   Refer to the procedure report that was given to you for any specific questions about what was found during the examination.  If the procedure report does not answer your questions, please call your gastroenterologist to clarify.  If you requested that your care partner not be given the details of your procedure findings, then the procedure report has been included in a sealed envelope for you to review at your convenience later.  YOU SHOULD EXPECT: Some feelings of bloating in the abdomen. Passage of more gas than usual.  Walking can help get rid of the air that was put into your GI tract during the procedure and reduce the bloating. If you had a lower endoscopy (such as a colonoscopy or flexible sigmoidoscopy) you may notice spotting of blood in your stool or on the toilet paper. If you underwent a bowel prep for your procedure, you may not have a normal bowel movement for a few days.  Please Note:  You might notice some irritation and congestion in your nose or some drainage.  This is from the oxygen used during your procedure.  There is no need for concern and it should clear up in a day or so.  SYMPTOMS TO REPORT IMMEDIATELY:  Following upper endoscopy (EGD)  Vomiting of blood or coffee ground material  New chest pain or pain under the shoulder blades  Painful or persistently difficult swallowing  New shortness of breath  Fever of 100F or higher  Black, tarry-looking stools  For urgent or emergent issues, a gastroenterologist can be reached at any hour by calling [phone].   DIET: Your first meal following the procedure should be a small meal and then it is ok to progress to your normal diet. Heavy or fried foods are harder to digest and may make you feel nauseous or bloated.  Likewise, meals heavy in dairy and vegetables can increase bloating.  Drink plenty of fluids but you should avoid alcoholic beverages for 24  hours.  ACTIVITY:  You should plan to take it easy for the rest of today and you should NOT DRIVE or use heavy machinery until tomorrow (because of the sedation medicines used during the test).    FOLLOW UP: Our staff will call the number listed on your records the next business day following your procedure to check on you and address any questions or concerns that you may have regarding the information given to you following your procedure. If we do not reach you, we will leave a message.  However, if you are feeling well and you are not experiencing any problems, there is no need to return our call.  We will assume that you have returned to your regular daily activities without incident.  If any biopsies were taken you will be contacted by phone or by letter within the next 1-3 weeks.  Please call us at [phone] if you have not heard about the biopsies in 3 weeks.    SIGNATURES/CONFIDENTIALITY: You and/or your care partner have signed paperwork which will be entered into your electronic medical record.  These signatures attest to the fact that that the information above on your After Visit Summary has been reviewed and is understood.  Full responsibility of the confidentiality of this discharge information lies with you and/or your care-partner.  Esophagitis informtion given.  Anti reflux regimen  Hiatal hernia information  Protonix and Linzess as ordered.

## 2014-10-28 NOTE — Progress Notes (Signed)
Transferred to PACU.  NAD.  Report to Opal Sidles, Therapist, sports.

## 2014-10-31 ENCOUNTER — Telehealth: Payer: Self-pay

## 2014-10-31 NOTE — Telephone Encounter (Signed)
  Follow up Call-  Call back number 10/28/2014  Post procedure Call Back phone  # [phone]  Permission to leave phone message Yes     Patient questions:  Do you have a fever, pain , or abdominal swelling? No. Pain Score  0 *  Have you tolerated food without any problems? Yes.    Have you been able to return to your normal activities? Yes.    Do you have any questions about your discharge instructions: Diet   No. Medications  No. Follow up visit  No.  Do you have questions or concerns about your Care? No.  Actions: * If pain score is 4 or above: No action needed, pain <4.

## 2014-11-15 ENCOUNTER — Other Ambulatory Visit: Payer: Self-pay | Admitting: Nurse Practitioner

## 2014-11-15 NOTE — Telephone Encounter (Signed)
rx called into pharmacy

## 2014-11-15 NOTE — Telephone Encounter (Signed)
Last filled 08/19/04, last seen 09/14/14. Call into [facility]

## 2014-12-10 ENCOUNTER — Other Ambulatory Visit: Payer: Self-pay | Admitting: Nurse Practitioner

## 2014-12-13 ENCOUNTER — Other Ambulatory Visit: Payer: Self-pay | Admitting: Nurse Practitioner

## 2014-12-15 ENCOUNTER — Other Ambulatory Visit: Payer: Self-pay | Admitting: Nurse Practitioner

## 2014-12-15 NOTE — Telephone Encounter (Signed)
Rx for Ambien called into [facility] Okayed per MMM

## 2014-12-15 NOTE — Telephone Encounter (Signed)
Please call in ambien with 1 refills 

## 2014-12-15 NOTE — Telephone Encounter (Signed)
Last seen 09/14/14 MMM If approved route to nurse to call into [facility] 

## 2014-12-21 ENCOUNTER — Ambulatory Visit (INDEPENDENT_AMBULATORY_CARE_PROVIDER_SITE_OTHER): Payer: Commercial Managed Care - HMO | Admitting: Gastroenterology

## 2014-12-21 ENCOUNTER — Encounter: Payer: Self-pay | Admitting: Gastroenterology

## 2014-12-21 VITALS — BP 94/64 | HR 84 | Ht 68.25 in | Wt 165.1 lb

## 2014-12-21 DIAGNOSIS — K2100 Gastro-esophageal reflux disease with esophagitis, without bleeding: Secondary | ICD-10-CM

## 2014-12-21 DIAGNOSIS — K5900 Constipation, unspecified: Secondary | ICD-10-CM

## 2014-12-21 DIAGNOSIS — R1084 Generalized abdominal pain: Secondary | ICD-10-CM | POA: Diagnosis not present

## 2014-12-21 NOTE — Progress Notes (Signed)
    History of Present Illness: This is a 54 year old female returns for follow-up of GERD and constipation. She relates intermittent upper and lower abdominal pains that she feels her brought on by stress. Her husband was diagnosed with a glioblastoma multiform in November and she relates being under significant stress due to his deteriorating health. Patient's constipation is improved but continues to bother her on a regular basis. Reflux symptoms are under good control.  Current Medications, Allergies, Past Medical History, Past Surgical History, Family History and Social History were reviewed in [employer] record.  Physical Exam: General: Well developed , well nourished, no acute distress Head: Normocephalic and atraumatic Eyes:  sclerae anicteric, EOMI Ears: Normal auditory acuity Mouth: No deformity or lesions Lungs: Clear throughout to auscultation Heart: Regular rate and rhythm; no murmurs, rubs or bruits Abdomen: Soft, non tender and non distended. No masses, hepatosplenomegaly or hernias noted. Normal Bowel sounds Musculoskeletal: Symmetrical with no gross deformities  Pulses:  Normal pulses noted Extremities: No clubbing, cyanosis, edema or deformities noted Neurological: Alert oriented x 4, grossly nonfocal Psychological:  Alert and cooperative. Normal mood and affect  Assessment and Recommendations:  1. GERD with LA class a erosive esophagitis. Continue pantoprazole 40 mg daily along with standard antireflux measures.  2. Constipation. Continue Linzess 145 g daily. Begin MiraLAX once or twice daily titrated for adequate bowel movements. Records from her prior colonoscopy have not been received. Will request records again. If constipation does not improve will need to consider repeating colonoscopy.  3. Intermittent upper and lower abdominal pain. Likely related to constipation and IBS. If improved management of her constipation does not alleviate her  pain will consider an anti-spasmodic.

## 2014-12-21 NOTE — Patient Instructions (Signed)
You can add Miralax once daily for constipation.  Thank you for choosing me and [facility].  Pricilla Riffle. Dagoberto Ligas., MD., Marval Regal

## 2015-01-08 ENCOUNTER — Other Ambulatory Visit: Payer: Self-pay | Admitting: Nurse Practitioner

## 2015-01-09 ENCOUNTER — Encounter (INDEPENDENT_AMBULATORY_CARE_PROVIDER_SITE_OTHER): Payer: Self-pay

## 2015-01-12 ENCOUNTER — Other Ambulatory Visit: Payer: Self-pay | Admitting: Nurse Practitioner

## 2015-01-29 ENCOUNTER — Other Ambulatory Visit: Payer: Self-pay | Admitting: Nurse Practitioner

## 2015-01-29 DIAGNOSIS — M797 Fibromyalgia: Secondary | ICD-10-CM

## 2015-01-30 MED ORDER — HYDROCODONE-ACETAMINOPHEN 5-325 MG PO TABS: 1.0000 | ORAL_TABLET | Freq: Four times a day (QID) | ORAL | Status: DC | PRN

## 2015-01-30 NOTE — Telephone Encounter (Signed)
Pain rx ready for pick up  

## 2015-01-30 NOTE — Telephone Encounter (Signed)
Patient informed prescription is ready for pick up.

## 2015-02-16 ENCOUNTER — Telehealth: Payer: Self-pay | Admitting: Nurse Practitioner

## 2015-02-16 ENCOUNTER — Encounter: Payer: Self-pay | Admitting: Nurse Practitioner

## 2015-02-17 ENCOUNTER — Ambulatory Visit (INDEPENDENT_AMBULATORY_CARE_PROVIDER_SITE_OTHER): Payer: Commercial Managed Care - HMO | Admitting: Nurse Practitioner

## 2015-02-17 ENCOUNTER — Encounter: Payer: Self-pay | Admitting: Nurse Practitioner

## 2015-02-17 VITALS — BP 123/79 | HR 97 | Temp 97.0°F | Ht 68.0 in | Wt 167.0 lb

## 2015-02-17 DIAGNOSIS — M797 Fibromyalgia: Secondary | ICD-10-CM

## 2015-02-17 MED ORDER — HYDROCODONE-ACETAMINOPHEN 5-325 MG PO TABS: 1.0000 | ORAL_TABLET | Freq: Four times a day (QID) | ORAL | Status: DC | PRN

## 2015-02-17 MED ORDER — KETOROLAC TROMETHAMINE 60 MG/2ML IM SOLN
60.0000 mg | Freq: Once | INTRAMUSCULAR | Status: AC
  Administered 2015-02-17: 60 mg via INTRAMUSCULAR

## 2015-02-17 MED ORDER — ZOLPIDEM TARTRATE 10 MG PO TABS: 10.0000 mg | ORAL_TABLET | Freq: Every evening | ORAL | Status: DC | PRN

## 2015-02-17 MED ORDER — KETOROLAC TROMETHAMINE 10 MG PO TABS: 10.0000 mg | ORAL_TABLET | Freq: Four times a day (QID) | ORAL | Status: DC | PRN

## 2015-02-17 NOTE — Patient Instructions (Signed)
Fibromyalgia Fibromyalgia is a disorder that is often misunderstood. It is associated with muscular pains and tenderness that comes and goes. It is often associated with fatigue and sleep disturbances. Though it tends to be long-lasting, fibromyalgia is not life-threatening. CAUSES  The exact cause of fibromyalgia is unknown. People with certain gene types are predisposed to developing fibromyalgia and other conditions. Certain factors can play a role as triggers, such as:  Spine disorders.  Arthritis.  Severe injury (trauma) and other physical stressors.  Emotional stressors. SYMPTOMS   The main symptom is pain and stiffness in the muscles and joints, which can vary over time.  Sleep and fatigue problems. Other related symptoms may include:  Bowel and bladder problems.  Headaches.  Visual problems.  Problems with odors and noises.  Depression or mood changes.  Painful periods (dysmenorrhea).  Dryness of the skin or eyes. DIAGNOSIS  There are no specific tests for diagnosing fibromyalgia. Patients can be diagnosed accurately from the specific symptoms they have. The diagnosis is made by determining that nothing else is causing the problems. TREATMENT  There is no cure. Management includes medicines and an active, healthy lifestyle. The goal is to enhance physical fitness, decrease pain, and improve sleep. HOME CARE INSTRUCTIONS   Only take over-the-counter or prescription medicines as directed by your caregiver. Sleeping pills, tranquilizers, and pain medicines may make your problems worse.  Low-impact aerobic exercise is very important and advised for treatment. At first, it may seem to make pain worse. Gradually increasing your tolerance will overcome this feeling.  Learning relaxation techniques and how to control stress will help you. Biofeedback, visual imagery, hypnosis, muscle relaxation, yoga, and meditation are all options.  Anti-inflammatory medicines and  physical therapy may provide short-term help.  Acupuncture or massage treatments may help.  Take muscle relaxant medicines as suggested by your caregiver.  Avoid stressful situations.  Plan a healthy lifestyle. This includes your diet, sleep, rest, exercise, and friends.  Find and practice a hobby you enjoy.  Join a fibromyalgia support group for interaction, ideas, and sharing advice. This may be helpful. SEEK MEDICAL CARE IF:  You are not having good results or improvement from your treatment. FOR MORE INFORMATION  National Fibromyalgia Association: www.fmaware.org Arthritis Foundation: www.arthritis.org Document Released: 05/27/2005 Document Revised: 08/19/2011 Document Reviewed: 09/06/2009 ExitCare Patient Information 2015 [employer]. This information is not intended to replace advice given to you by your health care provider. Make sure you discuss any questions you have with your health care provider.  

## 2015-02-17 NOTE — Addendum Note (Signed)
Addended by: Chevis Pretty on: 02/17/2015 10:19 AM   Modules accepted: Orders

## 2015-02-17 NOTE — Progress Notes (Signed)
   Subjective:    Patient ID: [name], female    DOB: 11-21-1960, 54 y.o.   MRN: [id number]  HPI Patient in today with fibromyalgia flare up- SHe has thia on occasion and tordal relly helps he- SHe currently takes savella and norca daily which helps.Most of pain is in her back  Today. Rates pain 8/10 with increase in pain with movement.    Review of Systems  Constitutional: Negative.   HENT: Negative.   Respiratory: Negative.   Cardiovascular: Negative.   Gastrointestinal: Negative.   Genitourinary: Negative.   Neurological: Negative.   Psychiatric/Behavioral: Negative.   All other systems reviewed and are negative.      Objective:   Physical Exam  Constitutional: She is oriented to person, place, and time. She appears well-developed and well-nourished.  Cardiovascular: Normal rate, regular rhythm and normal heart sounds.   Pulmonary/Chest: Effort normal and breath sounds normal.  Abdominal: Soft.  Musculoskeletal:  Point tenderness noted up and down back.  Neurological: She is alert and oriented to person, place, and time.  Skin: Skin is warm and dry.  Psychiatric: She has a normal mood and affect. Her behavior is normal. Judgment and thought content normal.    BP 123/79 mmHg  Pulse 97  Temp(Src) 97 F (36.1 C) (Oral)  Ht 5\' 8"  (1.727 m)  Wt 167 lb (75.751 kg)  BMI 25.40 kg/m2       Assessment & Plan:   1. Fibromyalgia    Meds ordered this encounter  Medications  . ketorolac (TORADOL) injection 60 mg    Sig:   . ketorolac (TORADOL) 10 MG tablet    Sig: Take 1 tablet (10 mg total) by mouth every 6 (six) hours as needed.    Dispense:  30 tablet    Refill:  0    Order Specific Question:  Supervising Provider    Answer:  Chipper Herb [[id number]]  . HYDROcodone-acetaminophen (NORCO/VICODIN) 5-325 MG per tablet    Sig: Take 1 tablet by mouth every 6 (six) hours as needed for moderate pain.    Dispense:  60 tablet    Refill:  0    Do not fill till 03/01/15      Order Specific Question:  Supervising Provider    Answer:  Chipper Herb [[id number]]    Exercise Moist heat to areas RTO prn  Mary-Margaret Hassell Done, FNP

## 2015-03-09 ENCOUNTER — Other Ambulatory Visit: Payer: Self-pay | Admitting: Nurse Practitioner

## 2015-03-11 ENCOUNTER — Other Ambulatory Visit: Payer: Self-pay | Admitting: Family Medicine

## 2015-03-12 NOTE — Telephone Encounter (Signed)
Last lipids 2014

## 2015-03-15 ENCOUNTER — Other Ambulatory Visit: Payer: Self-pay | Admitting: Family Medicine

## 2015-03-15 ENCOUNTER — Other Ambulatory Visit: Payer: Self-pay | Admitting: Nurse Practitioner

## 2015-03-15 NOTE — Telephone Encounter (Signed)
Last seen 02/17/15  MMM

## 2015-03-15 NOTE — Telephone Encounter (Signed)
Last see 02/17/15  MMM

## 2015-03-24 ENCOUNTER — Encounter: Payer: Self-pay | Admitting: Nurse Practitioner

## 2015-03-24 ENCOUNTER — Other Ambulatory Visit: Payer: Self-pay | Admitting: Nurse Practitioner

## 2015-03-24 MED ORDER — EPINEPHRINE 0.3 MG/0.3ML IJ SOAJ: 0.3000 mg | Freq: Once | INTRAMUSCULAR | Status: AC

## 2015-03-31 ENCOUNTER — Telehealth: Payer: Self-pay | Admitting: Family

## 2015-03-31 ENCOUNTER — Encounter: Payer: Self-pay | Admitting: Nurse Practitioner

## 2015-03-31 ENCOUNTER — Other Ambulatory Visit: Payer: Self-pay | Admitting: Nurse Practitioner

## 2015-03-31 ENCOUNTER — Telehealth: Payer: Self-pay | Admitting: Nurse Practitioner

## 2015-03-31 DIAGNOSIS — B9689 Other specified bacterial agents as the cause of diseases classified elsewhere: Secondary | ICD-10-CM

## 2015-03-31 DIAGNOSIS — A499 Bacterial infection, unspecified: Secondary | ICD-10-CM

## 2015-03-31 DIAGNOSIS — J329 Chronic sinusitis, unspecified: Secondary | ICD-10-CM

## 2015-03-31 MED ORDER — AMOXICILLIN-POT CLAVULANATE 875-125 MG PO TABS: 1.0000 | ORAL_TABLET | Freq: Two times a day (BID) | ORAL | Status: AC

## 2015-03-31 MED ORDER — AZITHROMYCIN 250 MG PO TABS: ORAL_TABLET | ORAL | Status: DC

## 2015-03-31 MED ORDER — MECLIZINE HCL 25 MG PO TABS: 25.0000 mg | ORAL_TABLET | Freq: Three times a day (TID) | ORAL | Status: DC | PRN

## 2015-03-31 NOTE — Progress Notes (Signed)
We are sorry that you are not feeling well.  Here is how we plan to help!  Based on what you have shared with me it looks like you have sinusitis.  Sinusitis is inflammation and infection in the sinus cavities of the head.  Based on your presentation I believe you most likely have Acute Bacterial sinusitis.  This is an infection caused by bacteria and is treated with antibiotics.  I have prescribed Augmentin, an antibiotic in the penicillin family, one tablet twice daily with food, for 7 days.. You may use an oral decongestant such as Mucinex D or if you have glaucoma or high blood pressure use plain Mucinex.  Saline nasal sprays help and can safely be used as often as needed for congestion. If you develop worsening sinus pain, fever or notice severe headache and vision changes, or if symptoms are not better after completion of antibiotic, please schedule an appointment with a health care provider.  Sinus infections are not as easily transmitted as other respiratory infection, however we still recommend that you avoid close contact with loved ones, especially the very young and elderly.  Remember to wash your hands thoroughly throughout the day as this is the number one way to prevent the spread of infection!  *If you do not improve in 2-3 days, PLEASE go to the office as the other symptoms sound very uncomfortable and of concern if they persist (the dizziness, etc).   Home Care:  Only take medications as instructed by your medical team.  Complete the entire course of an antibiotic.  Do not take these medications with alcohol.  A steam or ultrasonic humidifier can help congestion.  You can place a towel over your head and breathe in the steam from hot water coming from a faucet.  Avoid close contacts especially the very young and the elderly.  Cover your mouth when you cough or sneeze.  Always remember to wash your hands.  Get Help Right Away If:  You develop worsening fever or sinus  pain.  You develop a severe head ache or visual changes.  Your symptoms persist after you have completed your treatment plan.  Make sure you  Understand these instructions.  Will watch your condition.  Will get help right away if you are not doing well or get worse.  Your e-visit answers were reviewed by a board certified advanced clinical practitioner to complete your personal care plan.  Depending on the condition, your plan could have included both over the counter or prescription medications.  If there is a problem please reply  once you have received a response from your provider.  Your safety is important to [place].  If you have drug allergies check your prescription carefully.    You can use MyChart to ask questions about today's visit, request a non-urgent call back, or ask for a work or school excuse for 24 hours related to this e-Visit. If it has been greater than 24 hours you will need to follow up with your provider, or enter a new e-Visit to address those concerns.  You will get an e-mail in the next two days asking about your experience.  I hope that your e-visit has been valuable and will speed your recovery. Thank you for using e-visits.

## 2015-04-08 ENCOUNTER — Other Ambulatory Visit: Payer: Self-pay | Admitting: Family Medicine

## 2015-04-13 ENCOUNTER — Encounter: Payer: Self-pay | Admitting: Nurse Practitioner

## 2015-04-13 ENCOUNTER — Ambulatory Visit (INDEPENDENT_AMBULATORY_CARE_PROVIDER_SITE_OTHER): Payer: Commercial Managed Care - HMO | Admitting: Nurse Practitioner

## 2015-04-13 VITALS — BP 117/71 | HR 93 | Temp 97.4°F | Ht 68.0 in | Wt 172.0 lb

## 2015-04-13 DIAGNOSIS — E785 Hyperlipidemia, unspecified: Secondary | ICD-10-CM | POA: Diagnosis not present

## 2015-04-13 DIAGNOSIS — M797 Fibromyalgia: Secondary | ICD-10-CM | POA: Diagnosis not present

## 2015-04-13 DIAGNOSIS — Z6826 Body mass index (BMI) 26.0-26.9, adult: Secondary | ICD-10-CM

## 2015-04-13 DIAGNOSIS — K219 Gastro-esophageal reflux disease without esophagitis: Secondary | ICD-10-CM

## 2015-04-13 DIAGNOSIS — K5900 Constipation, unspecified: Secondary | ICD-10-CM | POA: Diagnosis not present

## 2015-04-13 DIAGNOSIS — G4700 Insomnia, unspecified: Secondary | ICD-10-CM | POA: Diagnosis not present

## 2015-04-13 DIAGNOSIS — F411 Generalized anxiety disorder: Secondary | ICD-10-CM | POA: Diagnosis not present

## 2015-04-13 MED ORDER — PANTOPRAZOLE SODIUM 40 MG PO TBEC: 40.0000 mg | DELAYED_RELEASE_TABLET | Freq: Two times a day (BID) | ORAL | Status: DC

## 2015-04-13 MED ORDER — PRAVASTATIN SODIUM 40 MG PO TABS: 40.0000 mg | ORAL_TABLET | Freq: Every day | ORAL | Status: DC

## 2015-04-13 MED ORDER — MILNACIPRAN HCL 50 MG PO TABS: 50.0000 mg | ORAL_TABLET | Freq: Two times a day (BID) | ORAL | Status: DC

## 2015-04-13 MED ORDER — HYDROCODONE-ACETAMINOPHEN 5-325 MG PO TABS: 1.0000 | ORAL_TABLET | Freq: Four times a day (QID) | ORAL | Status: DC | PRN

## 2015-04-13 MED ORDER — ZOLPIDEM TARTRATE 10 MG PO TABS: 10.0000 mg | ORAL_TABLET | Freq: Every evening | ORAL | Status: DC | PRN

## 2015-04-13 MED ORDER — LINACLOTIDE 145 MCG PO CAPS: 145.0000 ug | ORAL_CAPSULE | Freq: Every day | ORAL | Status: DC

## 2015-04-13 MED ORDER — ALPRAZOLAM 0.25 MG PO TABS: ORAL_TABLET | ORAL | Status: DC

## 2015-04-13 MED ORDER — ESCITALOPRAM OXALATE 20 MG PO TABS: ORAL_TABLET | ORAL | Status: DC

## 2015-04-13 NOTE — Patient Instructions (Signed)

## 2015-04-13 NOTE — Progress Notes (Signed)
Subjective:    Patient ID: [name], female    DOB: 1960-07-10, 54 y.o.   MRN: [id number]  Patient here today for follow up of chronic medical problems. Patient hd anterior neck fusion since she was last here and her neck pain has  Greatly improved.   Hyperlipidemia This is a chronic problem. The current episode started more than 1 year ago. The problem is controlled. Recent lipid tests were reviewed and are normal. Associated symptoms include myalgias. Current antihyperlipidemic treatment includes statins. The current treatment provides moderate improvement of lipids. Compliance problems include adherence to diet and adherence to exercise.  Risk factors for coronary artery disease include dyslipidemia and post-menopausal.  Gastroesophageal Reflux This is a chronic problem. The problem occurs frequently. Associated symptoms include fatigue (not unusual for her). She has tried a histamine-2 antagonist for the symptoms. The treatment provided moderate relief.  Fibromylagia  Patient currently on savella and norco- doing ok- Has lost weight which has helped with some of her pain. Would like a shot today. Insomnia ambien working well feels rested in AM. GAD Husband is dying of brain cancer and she is having a rough time- she takes an occassional xanax Constipation Takes linzess which helps but still has problems with constipation on occasion    Review of Systems  Constitutional: Positive for fatigue (not unusual for her).  HENT: Negative.   Respiratory: Negative.   Cardiovascular: Negative.   Genitourinary: Negative.   Musculoskeletal: Positive for myalgias.  Neurological: Negative.   Psychiatric/Behavioral: Negative.   All other systems reviewed and are negative.      Objective:   Physical Exam  Constitutional: She is oriented to person, place, and time. She appears well-developed and well-nourished.  HENT:  Nose: Nose normal.  Mouth/Throat: Oropharynx is clear and moist.   Eyes: EOM are normal.  Neck: Trachea normal, normal range of motion and full passive range of motion without pain. Neck supple. No JVD present. Carotid bruit is not present. No thyromegaly present.  Cardiovascular: Normal rate, regular rhythm, normal heart sounds and intact distal pulses.  Exam reveals no gallop and no friction rub.   No murmur heard. Pulmonary/Chest: Effort normal and breath sounds normal.  Abdominal: Soft. Bowel sounds are normal. She exhibits no distension and no mass. There is no tenderness.  Musculoskeletal: Normal range of motion.  Lymphadenopathy:    She has no cervical adenopathy.  Neurological: She is alert and oriented to person, place, and time. She has normal reflexes.  Skin: Skin is warm and dry.  Psychiatric: She has a normal mood and affect. Her behavior is normal. Judgment and thought content normal.    BP 117/71 mmHg  Pulse 93  Temp(Src) 97.4 F (36.3 C) (Oral)  Ht $R'5\' 8"'se$  (1.727 m)  Wt 172 lb (78.019 kg)  BMI 26.16 kg/m2       Assessment & Plan:   1. Gastroesophageal reflux disease, esophagitis presence not specified Avoid spicy foods Do not eat 2 hours prior to bedtime - pantoprazole (PROTONIX) 40 MG tablet; Take 1 tablet (40 mg total) by mouth 2 (two) times daily.  Dispense: 90 tablet; Refill: 3  2. Fibromyalgia - HYDROcodone-acetaminophen (NORCO/VICODIN) 5-325 MG tablet; Take 1 tablet by mouth every 6 (six) hours as needed for moderate pain.  Dispense: 60 tablet; Refill: 0 - Milnacipran (SAVELLA) 50 MG TABS tablet; Take 1 tablet (50 mg total) by mouth 2 (two) times daily.  Dispense: 180 tablet; Refill: 1  3. Hyperlipidemia Low fat diet - pravastatin (  PRAVACHOL) 40 MG tablet; Take 1 tablet (40 mg total) by mouth daily.  Dispense: 90 tablet; Refill: 1 - CMP14+EGFR - Lipid panel  4. GAD (generalized anxiety disorder) Stress management - escitalopram (LEXAPRO) 20 MG tablet; TAKE 1 TABLET (20 MG TOTAL) BY MOUTH DAILY.  Dispense: 30 tablet;  Refill: 5 - ALPRAZolam (XANAX) 0.25 MG tablet; TAKE 1 TABLET TWICE A DAY AS NEEDED FOR ANXIETY  Dispense: 90 tablet; Refill: 0  5. Insomnia Bedtime ritual - zolpidem (AMBIEN) 10 MG tablet; Take 1 tablet (10 mg total) by mouth at bedtime as needed.  Dispense: 30 tablet; Refill: 1  6. BMI 26.0-26.9,adult Discussed diet and exercise for person with BMI >25 Will recheck weight in 3-6 months   7. Constipation, unspecified constipation type Increase fiber in diet - Linaclotide (LINZESS) 145 MCG CAPS capsule; Take 1 capsule (145 mcg total) by mouth daily. In the morning with food  Dispense: 90 capsule; Refill: 3    Labs pending Health maintenance reviewed Diet and exercise encouraged Continue all meds Follow up  In 6 months   [place], FNP

## 2015-04-14 LAB — CMP14+EGFR
A/G RATIO: 1.7 (ref 1.1–2.5)
ALT: 38 IU/L — ABNORMAL HIGH (ref 0–32)
AST: 35 IU/L (ref 0–40)
Albumin: 4.3 g/dL (ref 3.5–5.5)
Alkaline Phosphatase: 85 IU/L (ref 39–117)
BUN/Creatinine Ratio: 14 (ref 9–23)
BUN: 10 mg/dL (ref 6–24)
Bilirubin Total: <0.2 mg/dL (ref 0.0–1.2)
CALCIUM: 9 mg/dL (ref 8.7–10.2)
CO2: 26 mmol/L (ref 18–29)
CREATININE: 0.74 mg/dL (ref 0.57–1.00)
Chloride: 100 mmol/L (ref 97–106)
GFR, EST AFRICAN AMERICAN: 105 mL/min/{1.73_m2} (ref >59)
GFR, EST NON AFRICAN AMERICAN: 91 mL/min/{1.73_m2} (ref >59)
GLOBULIN, TOTAL: 2.5 g/dL (ref 1.5–4.5)
Glucose: 87 mg/dL (ref 65–99)
POTASSIUM: 3.9 mmol/L (ref 3.5–5.2)
SODIUM: 143 mmol/L (ref 136–144)
TOTAL PROTEIN: 6.8 g/dL (ref 6.0–8.5)

## 2015-04-14 LAB — LIPID PANEL
CHOL/HDL RATIO: 3.3 ratio (ref 0.0–4.4)
Cholesterol, Total: 143 mg/dL (ref 100–199)
HDL: 44 mg/dL (ref >39)
LDL CALC: 63 mg/dL (ref 0–99)
TRIGLYCERIDES: 179 mg/dL — AB (ref 0–149)
VLDL Cholesterol Cal: 36 mg/dL (ref 5–40)

## 2015-04-26 IMAGING — CR DG CHEST 2V
2 series · 2 of 2 positions shown · non-contrast
Comparison: 05/06/2005

CLINICAL DATA: Routine health examination.

EXAM:
CHEST  2 VIEW

[view not recorded (1 of 2)]
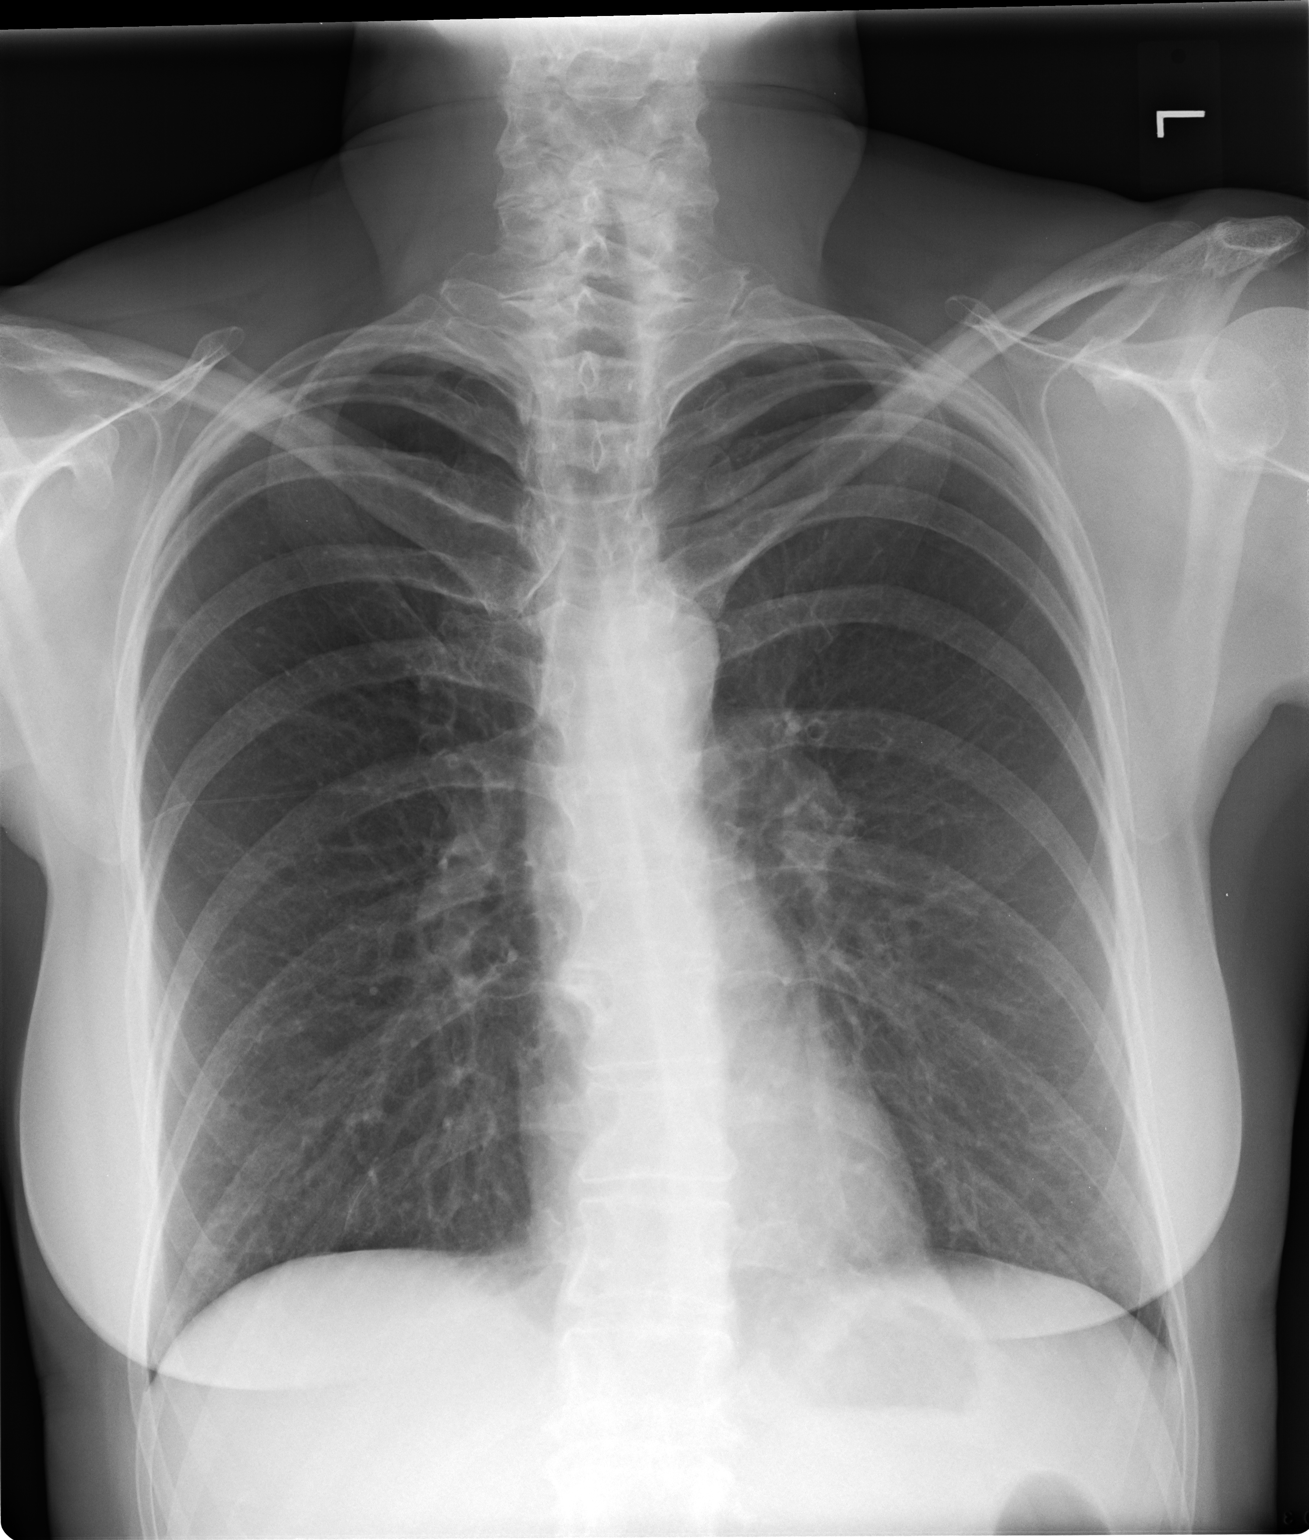

[view not recorded (2 of 2)]
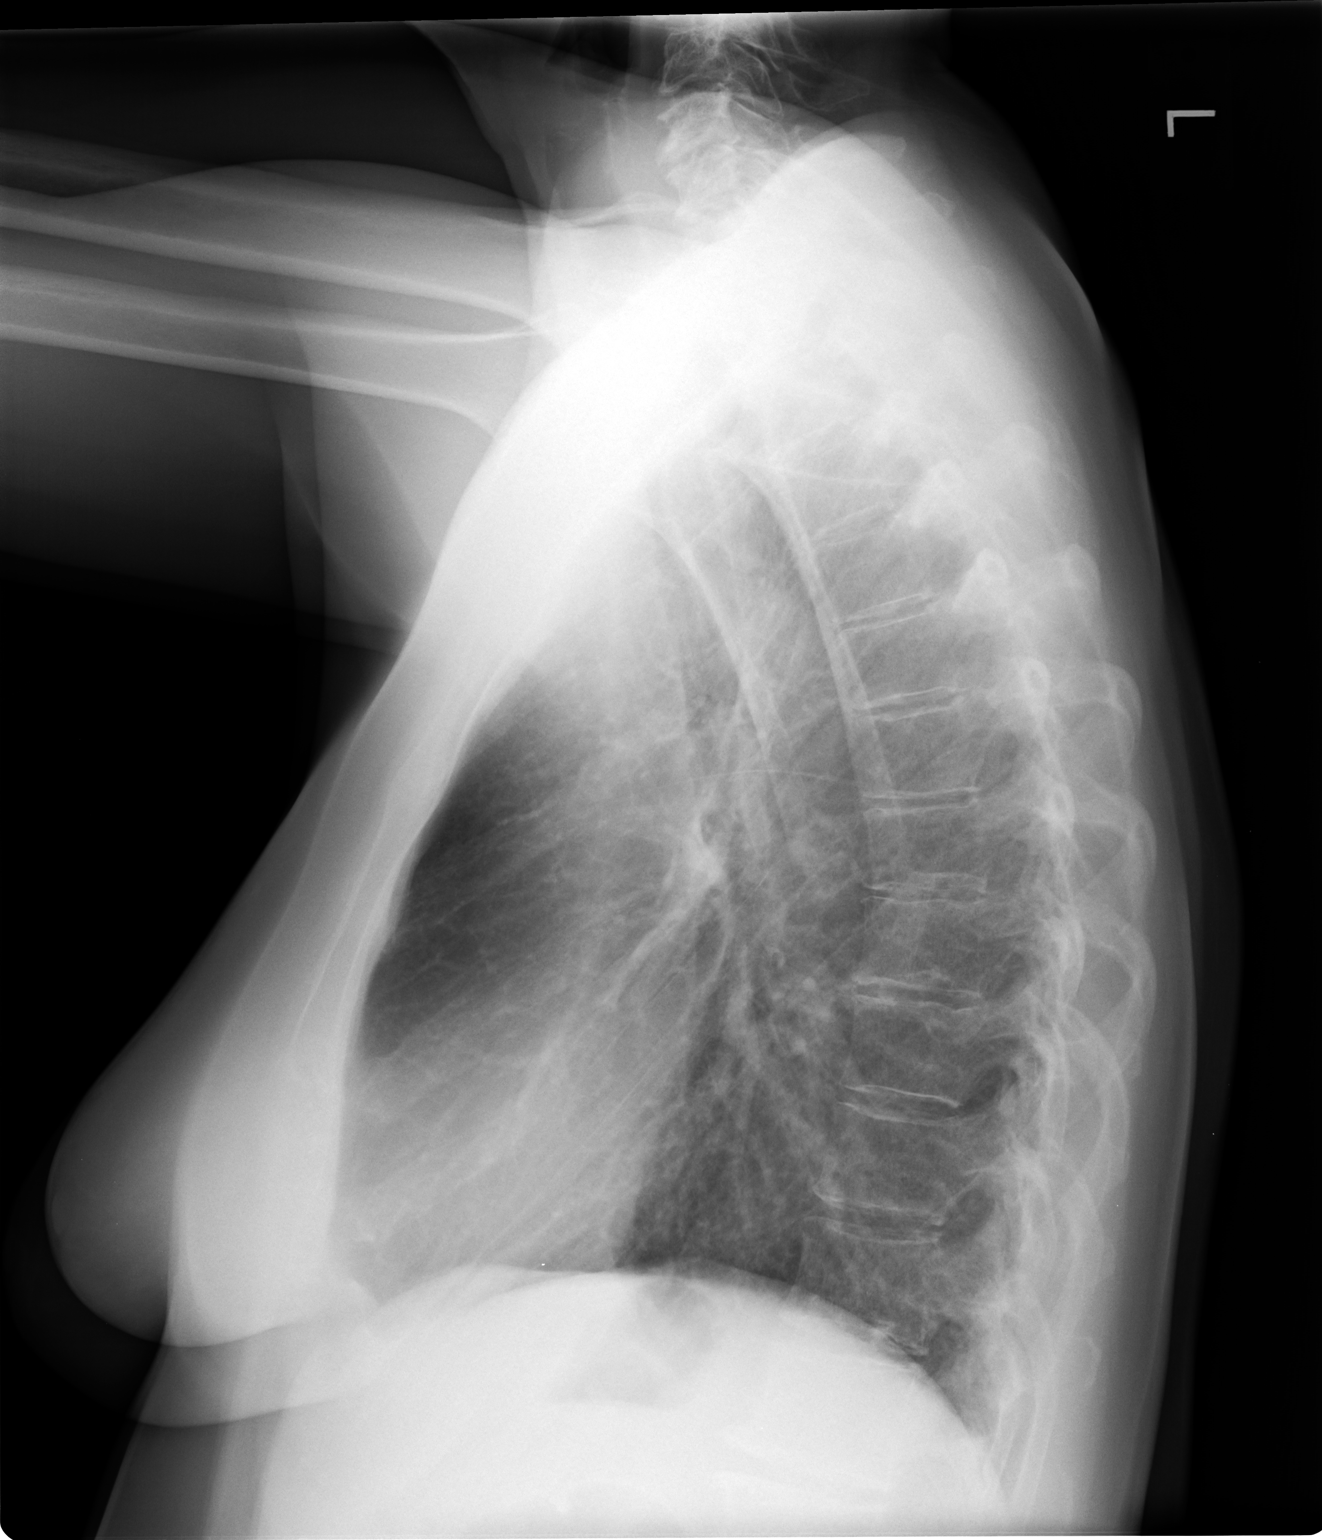

[2 of 2 positions shown; findings below may reference images not displayed]

FINDINGS: Small area of peripheral right upper lobe scarring is stable. Lungs
are otherwise clear.

Normal heart, mediastinum and hila.

No pleural effusion or pneumothorax.

Bony thorax is intact.
IMPRESSION: No active cardiopulmonary disease.

## 2015-05-06 ENCOUNTER — Other Ambulatory Visit: Payer: Self-pay | Admitting: Gastroenterology

## 2015-05-19 IMAGING — RF DG C-ARM 61-120 MIN
1 series · 7 of 7 positions shown · non-contrast
Comparison: 10/11/2003 MRI

CLINICAL DATA: Neck pain.

EXAM:
DG C-ARM 61-120 MIN; CERVICAL SPINE - 2-3 VIEW
FLUOROSCOPY TIME:  Provided in the report by the operating surgeon.

[Series 1: run · 7 of 7 slices shown]
[im 1/7]
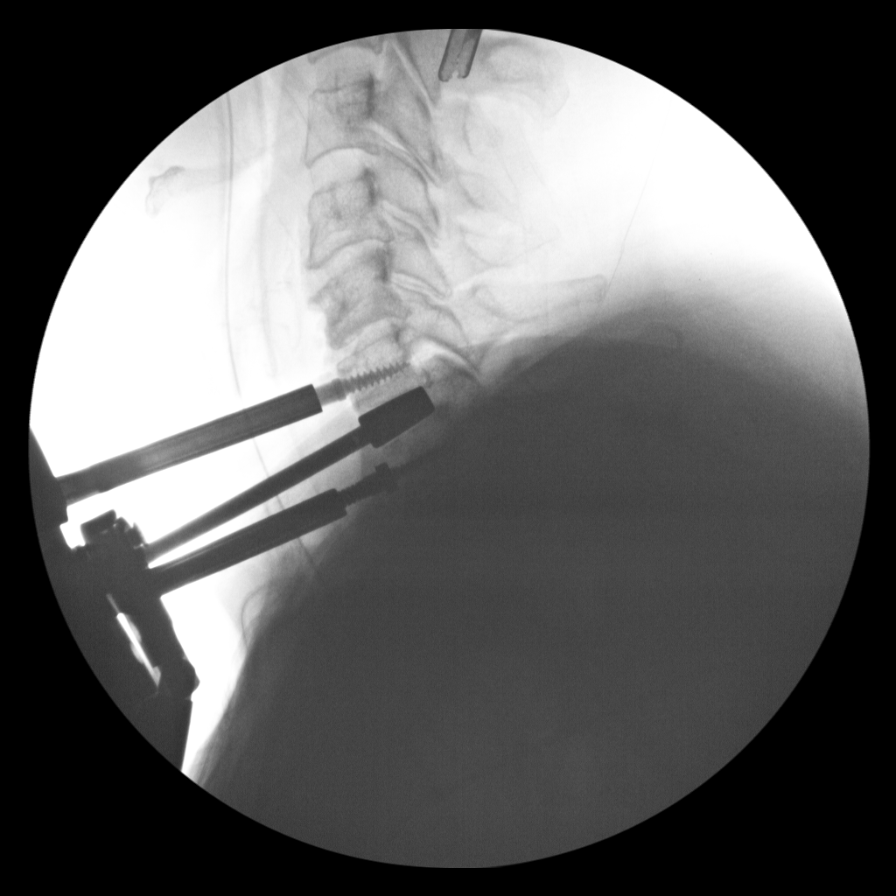
[im 2/7]
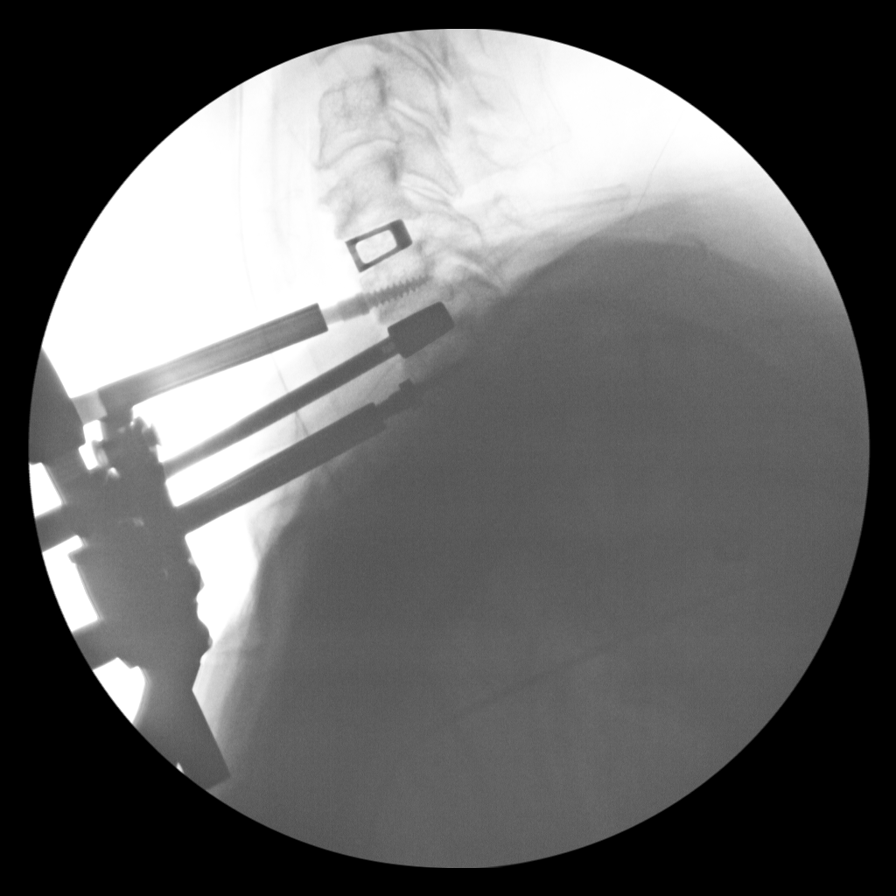
[im 3/7]
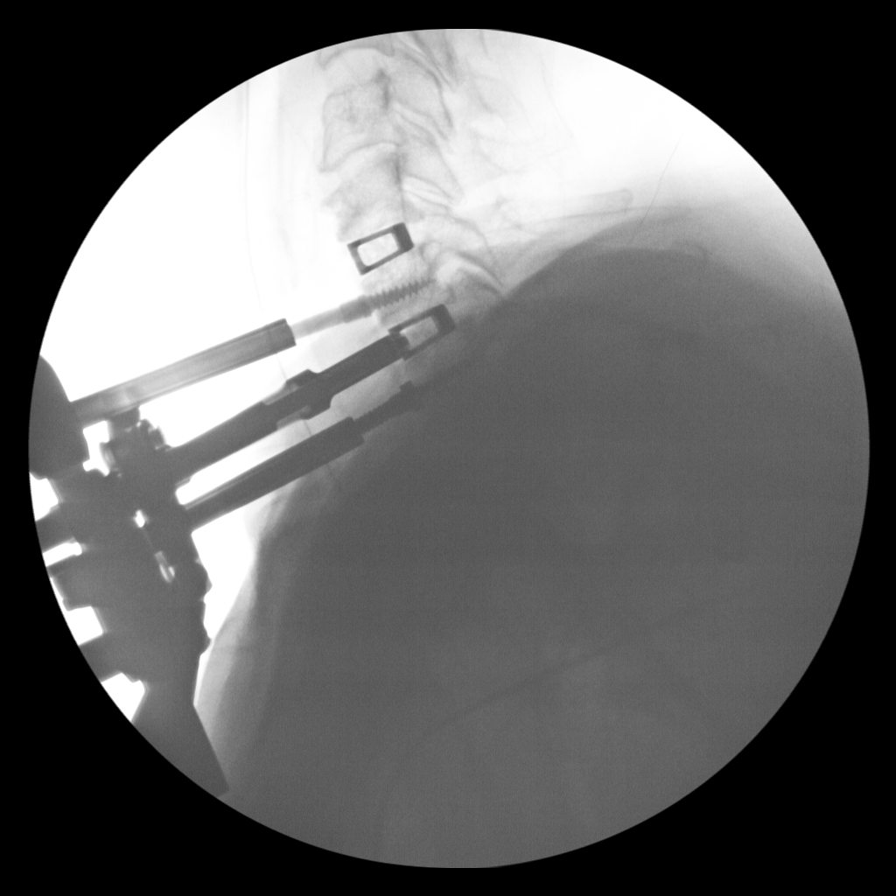
[im 4/7]
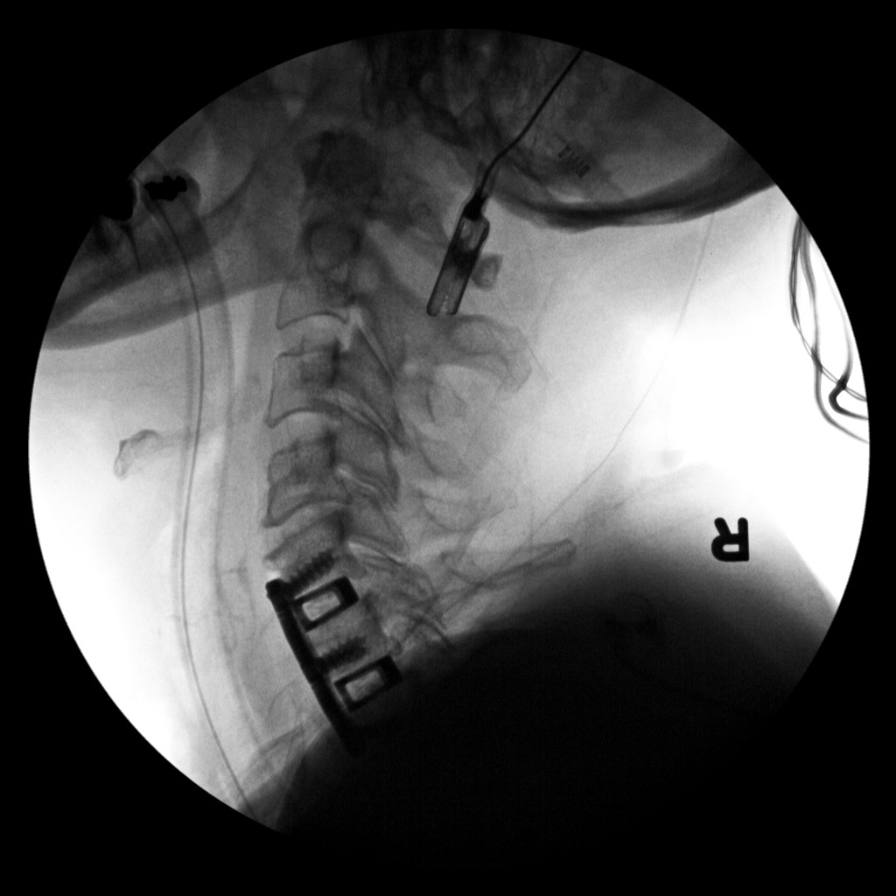
[im 5/7]
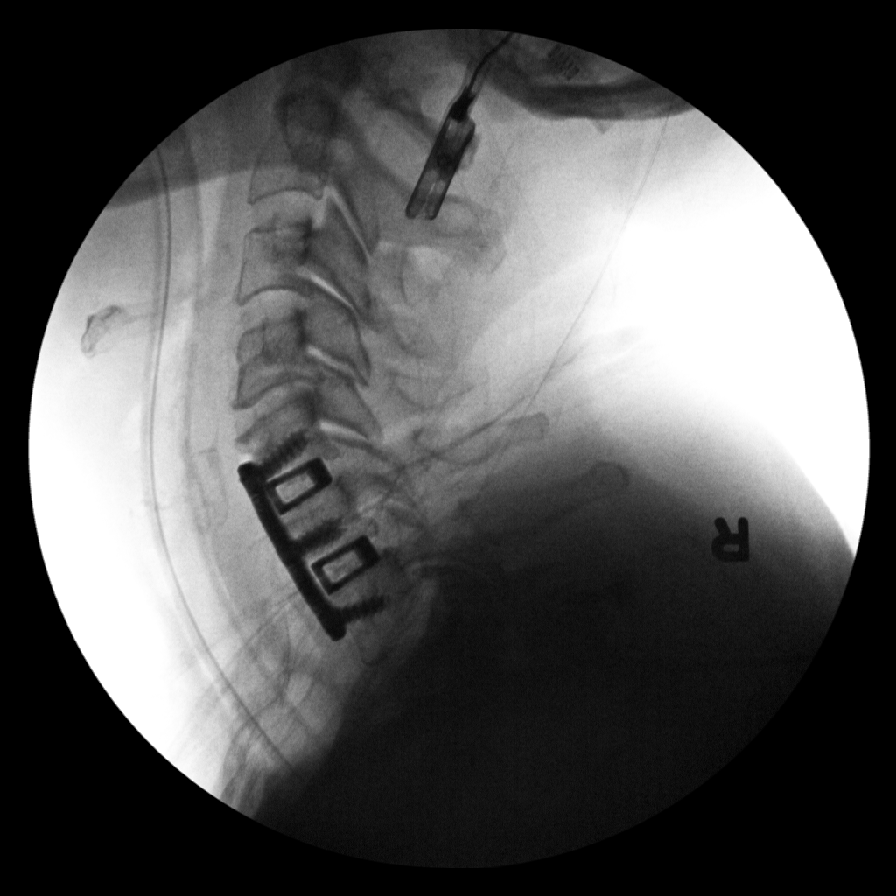
[im 6/7]
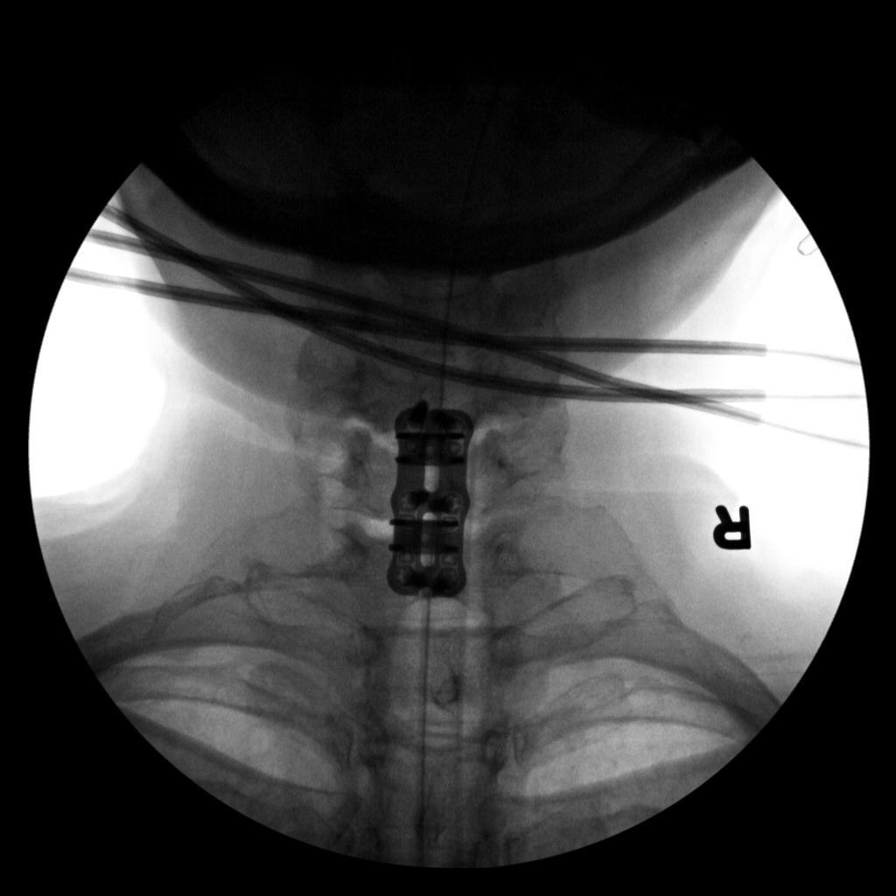
[im 7/7]
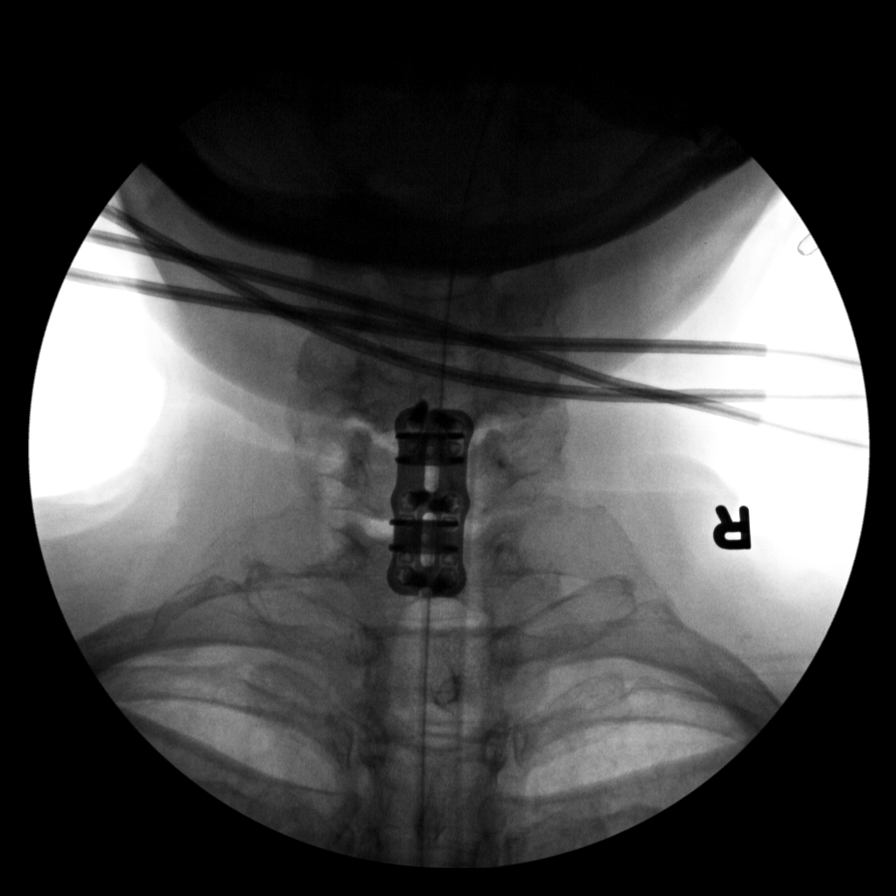

[7 of 7 positions shown; findings below may reference images not displayed]

FINDINGS: C-arm films document C5-7 ACDF. Satisfactory position and alignment.
IMPRESSION: As above.

## 2015-05-19 IMAGING — CR DG CERVICAL SPINE 2 OR 3 VIEWS
2 series · 2 of 2 positions shown · non-contrast
Comparison: Intraoperative imaging same date, DG C-ARM 1-60 MIN
dated 10/27/2013; DG CERVICAL SPINE 2-3 VIEWS dated 10/21/2013

CLINICAL DATA: Post ACDF

EXAM:
CERVICAL SPINE - 2-3 VIEW

[AP]
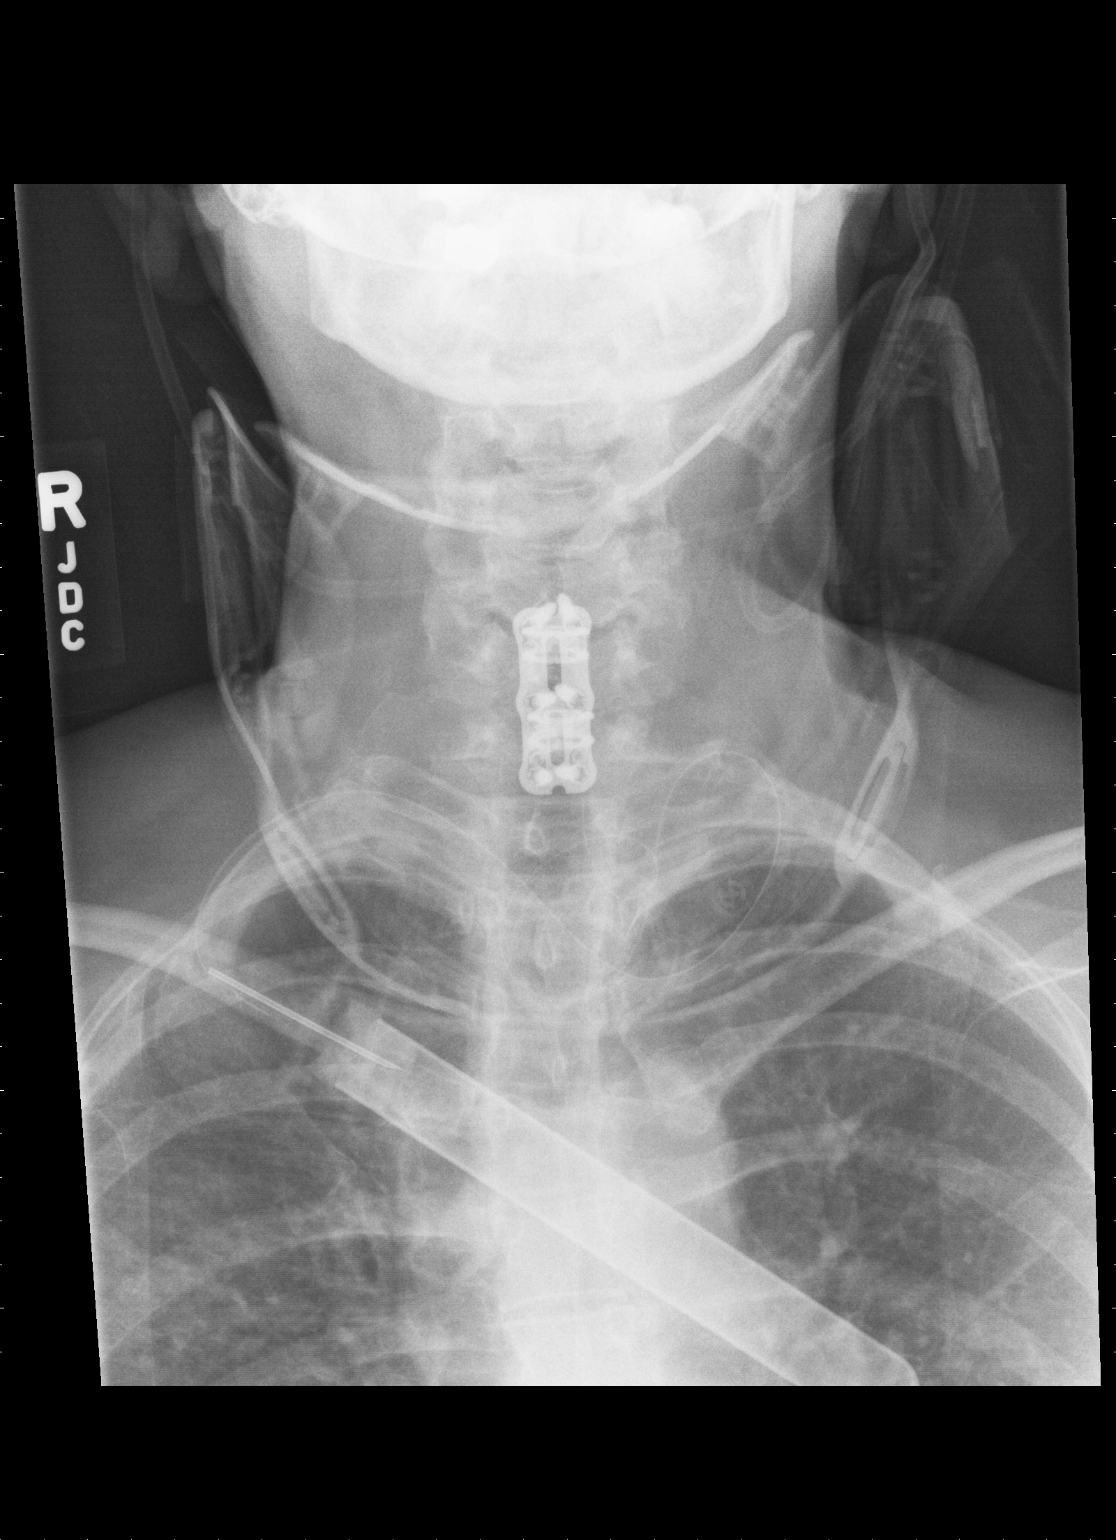

[c spine lat]
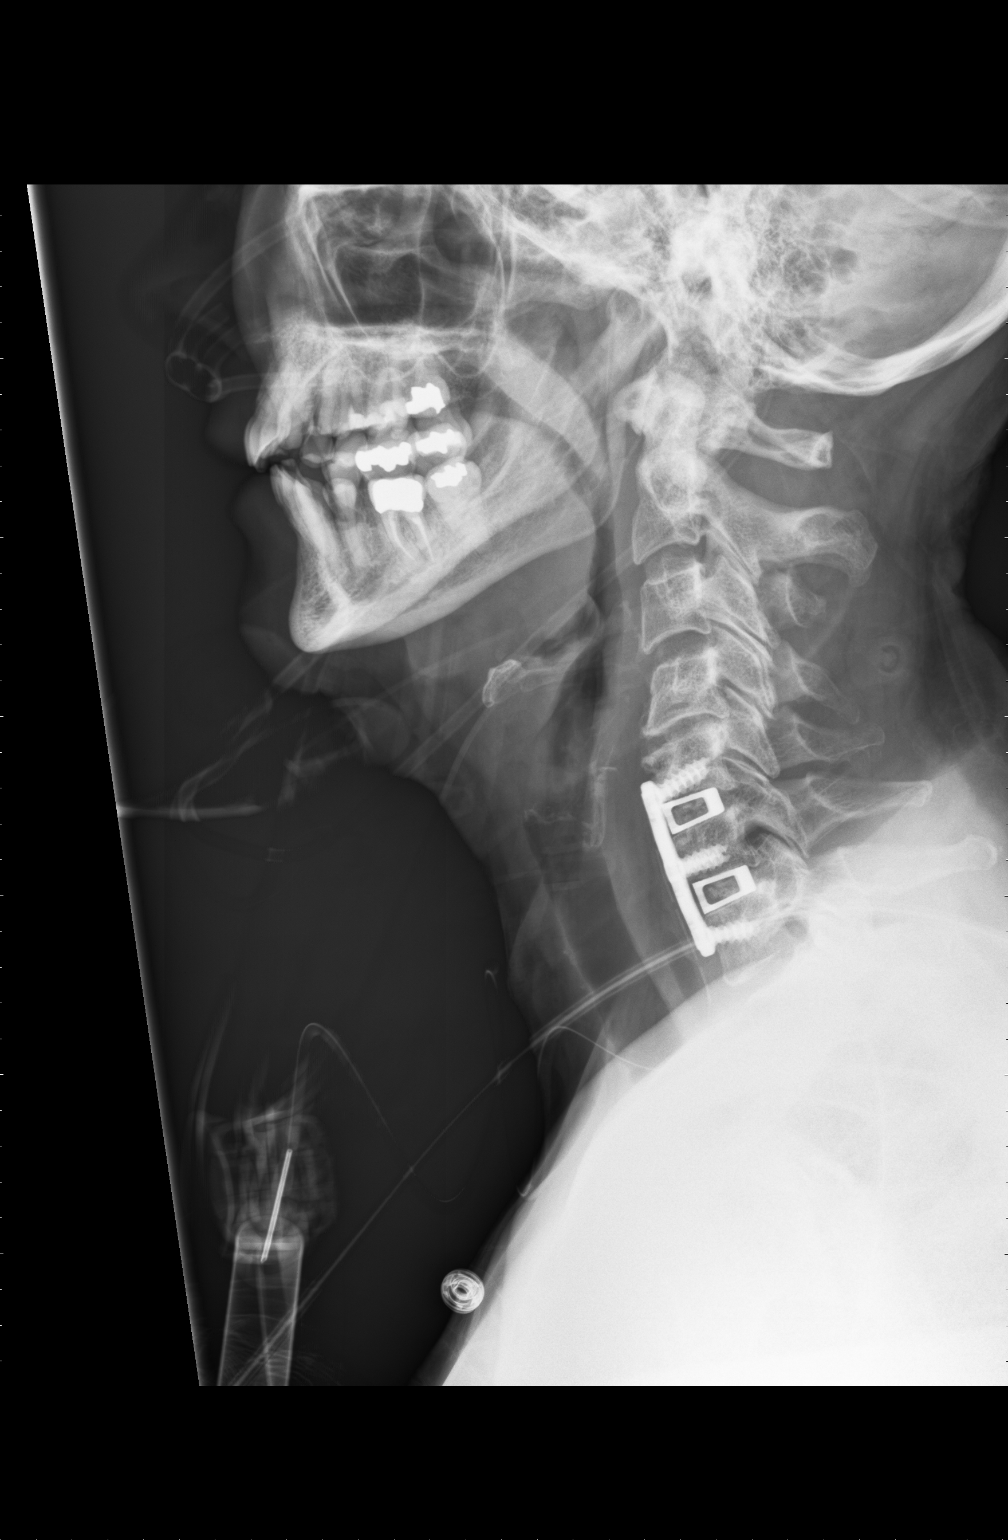

[2 of 2 positions shown; findings below may reference images not displayed]

FINDINGS: Evidence of C5-7 ACDF noted. No evidence for hardware failure. No
fracture line identified. Lung apices are grossly clear.
IMPRESSION: C5-7 ACDF.

## 2015-06-25 ENCOUNTER — Other Ambulatory Visit: Payer: Self-pay | Admitting: Nurse Practitioner

## 2015-06-25 ENCOUNTER — Other Ambulatory Visit: Payer: Self-pay | Admitting: Pediatrics

## 2015-06-26 NOTE — Telephone Encounter (Signed)
Please advise on refill- last seen 04/13/15, no follow up scheduled.

## 2015-06-27 NOTE — Telephone Encounter (Signed)
rx called into pharmacy

## 2015-06-27 NOTE — Telephone Encounter (Signed)
Please call in xanax with 1 refills 

## 2015-07-18 ENCOUNTER — Other Ambulatory Visit: Payer: Self-pay | Admitting: Nurse Practitioner

## 2015-07-18 ENCOUNTER — Encounter: Payer: Self-pay | Admitting: Nurse Practitioner

## 2015-07-18 DIAGNOSIS — M797 Fibromyalgia: Secondary | ICD-10-CM

## 2015-07-18 DIAGNOSIS — G4700 Insomnia, unspecified: Secondary | ICD-10-CM

## 2015-07-18 MED ORDER — HYDROCODONE-ACETAMINOPHEN 5-325 MG PO TABS: 1.0000 | ORAL_TABLET | Freq: Four times a day (QID) | ORAL | Status: DC | PRN

## 2015-07-18 MED ORDER — ZOLPIDEM TARTRATE 10 MG PO TABS: 10.0000 mg | ORAL_TABLET | Freq: Every evening | ORAL | Status: DC | PRN

## 2015-07-18 NOTE — Telephone Encounter (Signed)
Patient aware rx ready to be picked up 

## 2015-07-18 NOTE — Progress Notes (Signed)
rx called in and pt is aware.

## 2015-07-18 NOTE — Telephone Encounter (Signed)
Pain rx ready for pick up  

## 2015-07-24 ENCOUNTER — Ambulatory Visit (INDEPENDENT_AMBULATORY_CARE_PROVIDER_SITE_OTHER): Payer: Commercial Managed Care - HMO | Admitting: Nurse Practitioner

## 2015-07-24 ENCOUNTER — Encounter: Payer: Self-pay | Admitting: Nurse Practitioner

## 2015-07-24 VITALS — BP 117/82 | HR 103 | Temp 97.1°F | Ht 68.0 in | Wt 170.0 lb

## 2015-07-24 DIAGNOSIS — E785 Hyperlipidemia, unspecified: Secondary | ICD-10-CM

## 2015-07-24 DIAGNOSIS — Z6826 Body mass index (BMI) 26.0-26.9, adult: Secondary | ICD-10-CM

## 2015-07-24 DIAGNOSIS — G43109 Migraine with aura, not intractable, without status migrainosus: Secondary | ICD-10-CM

## 2015-07-24 DIAGNOSIS — K5900 Constipation, unspecified: Secondary | ICD-10-CM | POA: Diagnosis not present

## 2015-07-24 DIAGNOSIS — G4700 Insomnia, unspecified: Secondary | ICD-10-CM

## 2015-07-24 DIAGNOSIS — M797 Fibromyalgia: Secondary | ICD-10-CM

## 2015-07-24 DIAGNOSIS — K219 Gastro-esophageal reflux disease without esophagitis: Secondary | ICD-10-CM | POA: Diagnosis not present

## 2015-07-24 DIAGNOSIS — F411 Generalized anxiety disorder: Secondary | ICD-10-CM | POA: Diagnosis not present

## 2015-07-24 MED ORDER — PANTOPRAZOLE SODIUM 40 MG PO TBEC: 40.0000 mg | DELAYED_RELEASE_TABLET | Freq: Two times a day (BID) | ORAL | Status: DC

## 2015-07-24 MED ORDER — PRAVASTATIN SODIUM 40 MG PO TABS: 40.0000 mg | ORAL_TABLET | Freq: Every day | ORAL | Status: DC

## 2015-07-24 MED ORDER — ALPRAZOLAM 0.25 MG PO TABS: 0.2500 mg | ORAL_TABLET | Freq: Two times a day (BID) | ORAL | Status: DC | PRN

## 2015-07-24 MED ORDER — ZOLPIDEM TARTRATE 10 MG PO TABS: 10.0000 mg | ORAL_TABLET | Freq: Every evening | ORAL | Status: DC | PRN

## 2015-07-24 MED ORDER — ESCITALOPRAM OXALATE 20 MG PO TABS: ORAL_TABLET | ORAL | Status: DC

## 2015-07-24 MED ORDER — KETOROLAC TROMETHAMINE 60 MG/2ML IM SOLN
60.0000 mg | Freq: Once | INTRAMUSCULAR | Status: AC
  Administered 2015-07-24: 60 mg via INTRAMUSCULAR

## 2015-07-24 MED ORDER — MILNACIPRAN HCL 50 MG PO TABS: 50.0000 mg | ORAL_TABLET | Freq: Two times a day (BID) | ORAL | Status: DC

## 2015-07-24 MED ORDER — HYDROCODONE-ACETAMINOPHEN 5-325 MG PO TABS: 1.0000 | ORAL_TABLET | Freq: Four times a day (QID) | ORAL | Status: DC | PRN

## 2015-07-24 NOTE — Progress Notes (Signed)
Subjective:    Patient ID: [name], female    DOB: 1960/08/26, 55 y.o.   MRN: [id number]  Patient here today for follow up of chronic medical problems. Patient hd anterior neck fusion since she was last here and her neck pain has  Greatly improved.   Hyperlipidemia This is a chronic problem. The current episode started more than 1 year ago. The problem is controlled. Recent lipid tests were reviewed and are normal. Associated symptoms include myalgias. Current antihyperlipidemic treatment includes statins. The current treatment provides moderate improvement of lipids. Compliance problems include adherence to diet and adherence to exercise.  Risk factors for coronary artery disease include dyslipidemia and post-menopausal.  Gastroesophageal Reflux This is a chronic problem. The problem occurs frequently. Associated symptoms include fatigue (not unusual for her). She has tried a histamine-2 antagonist for the symptoms. The treatment provided moderate relief.  Fibromylagia  Patient currently on savella and norco- doing ok- Has lost weight which has helped with some of her pain. Would like a shot today. Insomnia ambien working well feels rested in AM. GAD Husband is dying of brain cancer and she is having a rough time- she takes an occassional xanax Constipation Takes linzess which helps but still has problems with constipation on occasion    Review of Systems  Constitutional: Positive for fatigue (not unusual for her).  HENT: Negative.   Respiratory: Negative.   Cardiovascular: Negative.   Genitourinary: Negative.   Musculoskeletal: Positive for myalgias.  Neurological: Negative.   Psychiatric/Behavioral: Negative.   All other systems reviewed and are negative.      Objective:   Physical Exam  Constitutional: She is oriented to person, place, and time. She appears well-developed and well-nourished.  HENT:  Nose: Nose normal.  Mouth/Throat: Oropharynx is clear and moist.   Eyes: EOM are normal.  Neck: Trachea normal, normal range of motion and full passive range of motion without pain. Neck supple. No JVD present. Carotid bruit is not present. No thyromegaly present.  Cardiovascular: Normal rate, regular rhythm, normal heart sounds and intact distal pulses.  Exam reveals no gallop and no friction rub.   No murmur heard. Pulmonary/Chest: Effort normal and breath sounds normal.  Abdominal: Soft. Bowel sounds are normal. She exhibits no distension and no mass. There is no tenderness.  Musculoskeletal: Normal range of motion.  Lymphadenopathy:    She has no cervical adenopathy.  Neurological: She is alert and oriented to person, place, and time. She has normal reflexes.  Skin: Skin is warm and dry.  Psychiatric: She has a normal mood and affect. Her behavior is normal. Judgment and thought content normal.    BP 117/82 mmHg  Pulse 103  Temp(Src) 97.1 F (36.2 C) (Oral)  Ht _0  (1.727 m)  Wt 170 lb (77.111 kg)  BMI 25.85 kg/m2       Assessment & Plan:   1. Migraine with aura and without status migrainosus, not intractable Avoid caffeine  2. Gastroesophageal reflux disease, esophagitis presence not specified Avoid spicy foods Do not eat 2 hours prior to bedtime - pantoprazole (PROTONIX) 40 MG tablet; Take 1 tablet (40 mg total) by mouth 2 (two) times daily.  Dispense: 90 tablet; Refill: 3  3. Fibromyalgia exercise - HYDROcodone-acetaminophen (NORCO/VICODIN) 5-325 MG tablet; Take 1 tablet by mouth every 6 (six) hours as needed for moderate pain.  Dispense: 60 tablet; Refill: 0 - Milnacipran (SAVELLA) 50 MG TABS tablet; Take 1 tablet (50 mg total) by mouth 2 (two) times daily.  Dispense: 180 tablet; Refill: 1  4. Hyperlipidemia Low fat diet - pravastatin (PRAVACHOL) 40 MG tablet; Take 1 tablet (40 mg total) by mouth daily.  Dispense: 90 tablet; Refill: 1 - CMP14+EGFR - Lipid panel  5. GAD (generalized anxiety disorder) Stress management -  ALPRAZolam (XANAX) 0.25 MG tablet; Take 1 tablet (0.25 mg total) by mouth 2 (two) times daily as needed.  Dispense: 90 tablet; Refill: 1 - escitalopram (LEXAPRO) 20 MG tablet; TAKE 1 TABLET (20 MG TOTAL) BY MOUTH DAILY.  Dispense: 30 tablet; Refill: 5  6. Insomnia Bedtime ritual - zolpidem (AMBIEN) 10 MG tablet; Take 1 tablet (10 mg total) by mouth at bedtime as needed.  Dispense: 30 tablet; Refill: 1  7. BMI 26.0-26.9,adult Discussed diet and exercise for person with BMI >25 Will recheck weight in 3-6 months  8. Constipation, unspecified constipation type   Labs pending Health maintenance reviewed Diet and exercise encouraged  Continue all meds Follow up  In 6 month  [place], FNP

## 2015-07-24 NOTE — Patient Instructions (Signed)
Stress and Stress Management Stress is a normal reaction to life events. It is what you feel when life demands more than you are used to or more than you can handle. Some stress can be useful. For example, the stress reaction can help you catch the last bus of the day, study for a test, or meet a deadline at work. But stress that occurs too often or for too long can cause problems. It can affect your emotional health and interfere with relationships and normal daily activities. Too much stress can weaken your immune system and increase your risk for physical illness. If you already have a medical problem, stress can make it worse. CAUSES  All sorts of life events may cause stress. An event that causes stress for one person may not be stressful for another person. Major life events commonly cause stress. These may be positive or negative. Examples include losing your job, moving into a new home, getting married, having a baby, or losing a loved one. Less obvious life events may also cause stress, especially if they occur day after day or in combination. Examples include working long hours, driving in traffic, caring for children, being in debt, or being in a difficult relationship. SIGNS AND SYMPTOMS Stress may cause emotional symptoms including, the following:  Anxiety. This is feeling worried, afraid, on edge, overwhelmed, or out of control.  Anger. This is feeling irritated or impatient.  Depression. This is feeling sad, down, helpless, or guilty.  Difficulty focusing, remembering, or making decisions. Stress may cause physical symptoms, including the following:   Aches and pains. These may affect your head, neck, back, stomach, or other areas of your body.  Tight muscles or clenched jaw.  Low energy or trouble sleeping. Stress may cause unhealthy behaviors, including the following:   Eating to feel better (overeating) or skipping meals.  Sleeping too little, too much, or both.  Working  too much or putting off tasks (procrastination).  Smoking, drinking alcohol, or using drugs to feel better. DIAGNOSIS  Stress is diagnosed through an assessment by your health care provider. Your health care provider will ask questions about your symptoms and any stressful life events.Your health care provider will also ask about your medical history and may order blood tests or other tests. Certain medical conditions and medicine can cause physical symptoms similar to stress. Mental illness can cause emotional symptoms and unhealthy behaviors similar to stress. Your health care provider may refer you to a mental health professional for further evaluation.  TREATMENT  Stress management is the recommended treatment for stress.The goals of stress management are reducing stressful life events and coping with stress in healthy ways.  Techniques for reducing stressful life events include the following:  Stress identification. Self-monitor for stress and identify what causes stress for you. These skills may help you to avoid some stressful events.  Time management. Set your priorities, keep a calendar of events, and learn to say "no." These tools can help you avoid making too many commitments. Techniques for coping with stress include the following:  Rethinking the problem. Try to think realistically about stressful events rather than ignoring them or overreacting. Try to find the positives in a stressful situation rather than focusing on the negatives.  Exercise. Physical exercise can release both physical and emotional tension. The key is to find a form of exercise you enjoy and do it regularly.  Relaxation techniques. These relax the body and mind. Examples include yoga, meditation, tai chi, biofeedback, deep  breathing, progressive muscle relaxation, listening to music, being out in nature, journaling, and other hobbies. Again, the key is to find one or more that you enjoy and can do  regularly.  Healthy lifestyle. Eat a balanced diet, get plenty of sleep, and do not smoke. Avoid using alcohol or drugs to relax.  Strong support network. Spend time with family, friends, or other people you enjoy being around.Express your feelings and talk things over with someone you trust. Counseling or talktherapy with a mental health professional may be helpful if you are having difficulty managing stress on your own. Medicine is typically not recommended for the treatment of stress.Talk to your health care provider if you think you need medicine for symptoms of stress. HOME CARE INSTRUCTIONS  Keep all follow-up visits as directed by your health care provider.  Take all medicines as directed by your health care provider. SEEK MEDICAL CARE IF:  Your symptoms get worse or you start having new symptoms.  You feel overwhelmed by your problems and can no longer manage them on your own. SEEK IMMEDIATE MEDICAL CARE IF:  You feel like hurting yourself or someone else.   This information is not intended to replace advice given to you by your health care provider. Make sure you discuss any questions you have with your health care provider.   Document Released: 11/20/2000 Document Revised: 06/17/2014 Document Reviewed: 01/19/2013 Elsevier Interactive Patient Education 2016 Elsevier Inc.  

## 2015-08-25 ENCOUNTER — Ambulatory Visit (INDEPENDENT_AMBULATORY_CARE_PROVIDER_SITE_OTHER): Payer: Commercial Managed Care - HMO | Admitting: Nurse Practitioner

## 2015-08-25 ENCOUNTER — Encounter: Payer: Self-pay | Admitting: Nurse Practitioner

## 2015-08-25 VITALS — BP 124/87 | HR 87 | Temp 97.1°F | Ht 68.0 in | Wt 179.0 lb

## 2015-08-25 DIAGNOSIS — Z7189 Other specified counseling: Secondary | ICD-10-CM | POA: Diagnosis not present

## 2015-08-25 DIAGNOSIS — M797 Fibromyalgia: Secondary | ICD-10-CM | POA: Diagnosis not present

## 2015-08-25 DIAGNOSIS — G8929 Other chronic pain: Secondary | ICD-10-CM

## 2015-08-25 MED ORDER — HYDROCODONE-ACETAMINOPHEN 10-325 MG PO TABS: 1.0000 | ORAL_TABLET | Freq: Three times a day (TID) | ORAL | Status: DC | PRN

## 2015-08-25 MED ORDER — HYDROCODONE-ACETAMINOPHEN 10-325 MG PO TABS: 1.0000 | ORAL_TABLET | Freq: Two times a day (BID) | ORAL | Status: DC

## 2015-08-25 MED ORDER — KETOROLAC TROMETHAMINE 60 MG/2ML IM SOLN
60.0000 mg | Freq: Once | INTRAMUSCULAR | Status: AC
  Administered 2015-08-25: 60 mg via INTRAMUSCULAR

## 2015-08-25 NOTE — Progress Notes (Signed)
Subjective:    Patient ID: [name], female    DOB: 1961-05-03, 56 y.o.   MRN: [id number]  HPI  [facility] Controlled Substance Abuse database reviewed- Yes  Depression screen [facility] 2/9 08/25/2015 07/24/2015 04/13/2015 06/22/2014 12/27/2013  Decreased Interest 2 1 3  0 0  Down, Depressed, Hopeless 2 2 1  0 0  PHQ - 2 Score 4 3 4  0 0  Altered sleeping 1 3 3  - -  Tired, decreased energy 3 2 3  - -  Change in appetite 2 2 3  - -  Feeling bad or failure about yourself  1 1 0 - -  Trouble concentrating 1 2 3  - -  Moving slowly or fidgety/restless 1 0 0 - -  Suicidal thoughts 0 0 0 - -  PHQ-9 Score 13 13 16  - -    GAD 7 : Generalized Anxiety Score 08/25/2015  Nervous, Anxious, on Edge 3  Control/stop worrying 3  Worry too much - different things 3  Trouble relaxing 3  Restless 0  Easily annoyed or irritable 3  Afraid - awful might happen 0  Total GAD 7 Score 15     *Patients husband has brain cancer and she is under a lot of stress- that is why GAD and depression scores are elevated.  [facility] drug screen performed- Yes  SOAPP  0= never  1= seldom  2=sometimes  3= often  4= very often  How often do you have mood swings? 0 How often do you smoke a cigarette within an hour after waling up? 0 How often have you taken medication other than the way that it was prescribed?0 How often have you used illegal drugs in the past 5 years? 0 How often, in your lifetime, have you had legal problems or been arrested? 0  Score 0  Alcohol Audit - How often during the last year have found that you: 0-Never   1- Less than monthly   2- Monthly     3-Weekly     4-daily or almost daily  - found that you were not able to stop drinking once you started- 0 -failed to do what was normally expected of you because of drinking- 0 -needed a first drink in the morning- 0 -had a feeling of guilt or remorse after drinking- 0 -are/were unable to remember what happened the night before because of  your drinking- 0  0- NO   2- yes but not in last year  4- yes during last year -Have you or someone else been injured because of your drinking- 0 - Has anyone been concerned about your drinking or suggested you cut down- 0        TOTAL- 0  ( 0-7- alcohol education, 8-15- simple advice, 16-19 simple advice plus counseling, 20-40 referral for evaluation and treatment 0   [place]- sometimes may need to use [facility] due to husbands illness and cannot get to [facility] to get rx.  Pain assessment: Cause of pain- fibromyalgia Pain location- all over Pain on scale of 1-10- 7/10 currently Frequency- daily What increases pain-tension What makes pain Better-exercise  Effects on ADL - able to do ADL because she has to.  Prior treatments tried and failed- muscle relaxors Current treatments- vicodin 5/325 and savella Morphine mg equivalent- 3  Pain management agreement reviewed and signed- Yes      Review of Systems  Constitutional: Negative.   HENT: Negative.   Respiratory: Negative.   Cardiovascular: Negative.  Genitourinary: Negative.   Neurological: Negative.   Psychiatric/Behavioral: Negative.   All other systems reviewed and are negative.      Objective:   Physical Exam  Constitutional: She is oriented to person, place, and time. She appears well-developed and well-nourished.  Cardiovascular: Normal rate, regular rhythm and normal heart sounds.   Pulmonary/Chest: Effort normal and breath sounds normal.  Musculoskeletal:  Point tenderness all over body  Neurological: She is alert and oriented to person, place, and time. She has normal reflexes.  Skin: Skin is warm.  Psychiatric: She has a normal mood and affect. Her behavior is normal. Judgment and thought content normal.    BP 124/87 mmHg  Pulse 87  Temp(Src) 97.1 F (36.2 C) (Oral)  Ht 5\' 8"  (1.727 m)  Wt 179 lb (81.194 kg)  BMI 27.22 kg/m2      Assessment & Plan:   1. Fibromyalgia     2. Encounter for chronic pain management    Meds ordered this encounter  Medications  . HYDROcodone-acetaminophen (NORCO) 10-325 MG tablet    Sig: Take 1 tablet by mouth every 8 (eight) hours as needed.    Dispense:  30 tablet    Refill:  0    Order Specific Question:  Supervising Provider    Answer:  Chipper Herb [[id number]]  . HYDROcodone-acetaminophen (NORCO) 10-325 MG tablet    Sig: Take 1 tablet by mouth every 8 (eight) hours as needed.    Dispense:  30 tablet    Refill:  0    Order Specific Question:  Supervising Provider    Answer:  Chipper Herb [[id number]]  . HYDROcodone-acetaminophen (NORCO) 10-325 MG tablet    Sig: Take 1 tablet by mouth every 8 (eight) hours as needed.    Dispense:  30 tablet    Refill:  0    Order Specific Question:  Supervising Provider    Answer:  Chipper Herb [[id number]]   Exercise encouraged Follow up in 3 months  [place], FNP

## 2015-08-25 NOTE — Patient Instructions (Signed)
Myofascial Pain Syndrome and Fibromyalgia  Myofascial pain syndrome and fibromyalgia are both pain disorders. This pain may be felt mainly in your muscles.   · Myofascial pain syndrome:    Always has trigger points or tender points in the muscle that will cause pain when pressed. The pain may come and go.    Usually affects your neck, upper back, and shoulder areas. The pain often radiates into your arms and hands.  · Fibromyalgia:    Has muscle pains and tenderness that come and go.    Is often associated with fatigue and sleep disturbances.    Has trigger points.    Tends to be long-lasting (chronic), but is not life-threatening.  Fibromyalgia and myofascial pain are not the same. However, they often occur together. If you have both conditions, each can make the other worse. Both are common and can cause enough pain and fatigue to make day-to-day activities difficult.   CAUSES   The exact causes of fibromyalgia and myofascial pain are not known. People with certain gene types may be more likely to develop fibromyalgia. Some factors can be triggers for both conditions, such as:   · Spine disorders.  · Arthritis.  · Severe injury (trauma) and other physical stressors.  · Being under a lot of stress.  · A medical illness.  SIGNS AND SYMPTOMS   Fibromyalgia  The main symptom of fibromyalgia is widespread pain and tenderness in your muscles. This can vary over time. Pain is sometimes described as stabbing, shooting, or burning. You may have tingling or numbness, too. You may also have sleep problems and fatigue. You may wake up feeling tired and groggy (fibro fog). Other symptoms may include:   · Bowel and bladder problems.  · Headaches.  · Visual problems.  · Problems with odors and noises.  · Depression or mood changes.  · Painful menstrual periods (dysmenorrhea).  · Dry skin or eyes.  Myofascial pain syndrome  Symptoms of myofascial pain syndrome include:   · Tight, ropy bands of muscle.    · Uncomfortable  sensations in muscular areas, such as:    Aching.    Cramping.    Burning.    Numbness.    Tingling.      Muscle weakness.  · Trouble moving certain muscles freely (range of motion).  DIAGNOSIS   There are no specific tests to diagnose fibromyalgia or myofascial pain syndrome. Both can be hard to diagnose because their symptoms are common in many other conditions. Your health care provider may suspect one or both of these conditions based on your symptoms and medical history. Your health care provider will also do a physical exam.   The key to diagnosing fibromyalgia is having pain, fatigue, and other symptoms for more than three months that cannot be explained by another condition.   The key to diagnosing myofascial pain syndrome is finding trigger points in muscles that are tender and cause pain elsewhere in your body (referred pain).  TREATMENT   Treating fibromyalgia and myofascial pain often requires a team of health care providers. This usually starts with your primary provider and a physical therapist. You may also find it helpful to work with alternative health care providers, such as massage therapists or acupuncturists.  Treatment for fibromyalgia may include medicines. This may include nonsteroidal anti-inflammatory drugs (NSAIDs), along with other medicines.   Treatment for myofascial pain may also include:  · NSAIDs.  · Cooling and stretching of muscles.  · Trigger point injections.  ·   Sound wave (ultrasound) treatments to stimulate muscles.  HOME CARE INSTRUCTIONS   · Take medicines only as directed by your health care provider.  · Exercise as directed by your health care provider or physical therapist.  · Try to avoid stressful situations.  · Practice relaxation techniques to control your stress. You may want to try:    Biofeedback.    Visual imagery.    Hypnosis.    Muscle relaxation.    Yoga.    Meditation.  · Talk to your health care provider about alternative treatments, such as acupuncture or  massage treatment.  · Maintain a healthy lifestyle. This includes eating a healthy diet and getting enough sleep.  · Consider joining a support group.  · Do not do activities that stress or strain your muscles. That includes repetitive motions and heavy lifting.  SEEK MEDICAL CARE IF:   · You have new symptoms.  · Your symptoms get worse.  · You have side effects from your medicines.  · You have trouble sleeping.  · Your condition is causing depression or anxiety.  FOR MORE INFORMATION   · National Fibromyalgia Association: http://www.fmaware.orgwww.fmaware.org  · Arthritis Foundation: http://www.arthritis.orgwww.arthritis.org  · American Chronic Pain Association: http://www.theacpa.org/condition/myofascial-painwww.theacpa.org/condition/myofascial-pain     This information is not intended to replace advice given to you by your health care provider. Make sure you discuss any questions you have with your health care provider.     Document Released: 05/27/2005 Document Revised: 06/17/2014 Document Reviewed: 03/02/2014  Elsevier Interactive Patient Education ©2016 Elsevier Inc.

## 2015-10-09 ENCOUNTER — Other Ambulatory Visit: Payer: Self-pay | Admitting: Nurse Practitioner

## 2015-10-27 ENCOUNTER — Other Ambulatory Visit: Payer: Self-pay | Admitting: Nurse Practitioner

## 2015-10-27 DIAGNOSIS — M797 Fibromyalgia: Secondary | ICD-10-CM

## 2015-10-27 MED ORDER — HYDROCODONE-ACETAMINOPHEN 10-325 MG PO TABS: 1.0000 | ORAL_TABLET | Freq: Two times a day (BID) | ORAL | Status: DC

## 2015-10-27 NOTE — Telephone Encounter (Signed)
rx ready for pickup 

## 2015-10-27 NOTE — Telephone Encounter (Signed)
Left message on VM that Rx is at front desk ready for pickup

## 2015-12-06 ENCOUNTER — Other Ambulatory Visit: Payer: Self-pay | Admitting: Nurse Practitioner

## 2015-12-14 ENCOUNTER — Other Ambulatory Visit: Payer: Self-pay | Admitting: *Deleted

## 2015-12-14 DIAGNOSIS — G4700 Insomnia, unspecified: Secondary | ICD-10-CM

## 2015-12-14 MED ORDER — ZOLPIDEM TARTRATE 10 MG PO TABS: 10.0000 mg | ORAL_TABLET | Freq: Every evening | ORAL | Status: DC | PRN

## 2015-12-14 NOTE — Telephone Encounter (Signed)
Please call in ambien with 1 refills 

## 2015-12-14 NOTE — Telephone Encounter (Signed)
Last seen 3/ 2017 - MMM

## 2015-12-15 NOTE — Telephone Encounter (Signed)
Rx called into pharmacy and left detailed message stating requested rx called in and to

## 2015-12-15 NOTE — Telephone Encounter (Signed)
Cont...  Pharmacy and to call back with any further questions or concerns.

## 2016-01-05 ENCOUNTER — Other Ambulatory Visit: Payer: Self-pay | Admitting: *Deleted

## 2016-01-05 DIAGNOSIS — Z1231 Encounter for screening mammogram for malignant neoplasm of breast: Secondary | ICD-10-CM

## 2016-01-08 ENCOUNTER — Other Ambulatory Visit: Payer: Self-pay | Admitting: Nurse Practitioner

## 2016-01-08 DIAGNOSIS — M797 Fibromyalgia: Secondary | ICD-10-CM

## 2016-01-08 MED ORDER — HYDROCODONE-ACETAMINOPHEN 10-325 MG PO TABS: 1.0000 | ORAL_TABLET | Freq: Two times a day (BID) | ORAL | 0 refills | Status: DC

## 2016-01-08 NOTE — Telephone Encounter (Signed)
rx ready for pickup 

## 2016-01-09 NOTE — Telephone Encounter (Signed)
Patient aware rx ready to be picked up 

## 2016-01-14 ENCOUNTER — Other Ambulatory Visit: Payer: Self-pay | Admitting: Nurse Practitioner

## 2016-01-24 ENCOUNTER — Telehealth: Payer: Self-pay | Admitting: Family Medicine

## 2016-01-29 ENCOUNTER — Other Ambulatory Visit: Payer: Self-pay | Admitting: Nurse Practitioner

## 2016-01-29 ENCOUNTER — Encounter: Payer: Self-pay | Admitting: Nurse Practitioner

## 2016-01-29 ENCOUNTER — Ambulatory Visit (INDEPENDENT_AMBULATORY_CARE_PROVIDER_SITE_OTHER): Payer: Commercial Managed Care - HMO | Admitting: Nurse Practitioner

## 2016-01-29 VITALS — BP 128/87 | HR 90 | Temp 97.0°F

## 2016-01-29 DIAGNOSIS — R42 Dizziness and giddiness: Secondary | ICD-10-CM | POA: Diagnosis not present

## 2016-01-29 DIAGNOSIS — M797 Fibromyalgia: Secondary | ICD-10-CM | POA: Diagnosis not present

## 2016-01-29 DIAGNOSIS — E785 Hyperlipidemia, unspecified: Secondary | ICD-10-CM

## 2016-01-29 MED ORDER — MECLIZINE HCL 25 MG PO TABS: 25.0000 mg | ORAL_TABLET | Freq: Three times a day (TID) | ORAL | 0 refills | Status: DC | PRN

## 2016-01-29 MED ORDER — MECLIZINE HCL 25 MG PO TABS
25.0000 mg | ORAL_TABLET | Freq: Three times a day (TID) | ORAL | 0 refills | Status: DC | PRN
Start: 2016-01-29 — End: 2016-05-16

## 2016-01-29 MED ORDER — PRAVASTATIN SODIUM 40 MG PO TABS: 40.0000 mg | ORAL_TABLET | Freq: Every day | ORAL | 1 refills | Status: DC

## 2016-01-29 MED ORDER — METHYLPREDNISOLONE ACETATE 80 MG/ML IJ SUSP
80.0000 mg | Freq: Once | INTRAMUSCULAR | Status: AC
  Administered 2016-01-29: 80 mg via INTRAMUSCULAR

## 2016-01-29 NOTE — Progress Notes (Signed)
   Subjective:    Patient ID: [name], female    DOB: 1961/01/20, 55 y.o.   MRN: [id number]  HPI Patient comes in today c/o slight dizziness. Says that she is loosing her mind. Her husband is dying from a brain tumor. The dizziness started about 4 days ago. The stress has just gradually increasing due to circumstances- she is on lexapro that helps a lot but the dizziness is the last straw.  * fibromyalgia is acting up due to stress  Review of Systems  Constitutional: Negative.   HENT: Negative.  Negative for congestion, ear discharge and ear pain.   Respiratory: Negative.   Cardiovascular: Negative.   Genitourinary: Negative.   Musculoskeletal: Negative.   Neurological: Positive for dizziness.  Psychiatric/Behavioral: Negative for suicidal ideas. The patient is nervous/anxious.   All other systems reviewed and are negative.      Objective:   Physical Exam  Constitutional: She is oriented to person, place, and time. She appears well-developed and well-nourished. No distress.  Cardiovascular: Normal rate, regular rhythm and normal heart sounds.   Pulmonary/Chest: Effort normal and breath sounds normal.  Abdominal: Soft. Bowel sounds are normal.  Neurological: She is alert and oriented to person, place, and time.  Skin: Skin is warm.  Psychiatric: She has a normal mood and affect. Her behavior is normal. Judgment and thought content normal.  Tearful during exam Good eye contact Answers all questions appropriately   BP 128/87   Pulse 90   Temp 97 F (36.1 C) (Oral)        Assessment & Plan:  1. Dizziness Rest  Force fluids - meclizine (ANTIVERT) 25 MG tablet; Take 1 tablet (25 mg total) by mouth 3 (three) times daily as needed for dizziness.  Dispense: 30 tablet; Refill: 0  2. Fibromyalgia rest - methylPREDNISolone acetate (DEPO-MEDROL) injection 80 mg; Inject 1 mL (80 mg total) into the muscle once.  Mary-Margaret Hassell Done, FNP

## 2016-01-29 NOTE — Patient Instructions (Signed)

## 2016-02-07 ENCOUNTER — Other Ambulatory Visit: Payer: Self-pay | Admitting: Nurse Practitioner

## 2016-02-07 DIAGNOSIS — F411 Generalized anxiety disorder: Secondary | ICD-10-CM

## 2016-02-08 NOTE — Telephone Encounter (Signed)
Please call in xanax with 1 refills 

## 2016-02-18 ENCOUNTER — Other Ambulatory Visit: Payer: Self-pay | Admitting: Nurse Practitioner

## 2016-02-18 DIAGNOSIS — G4700 Insomnia, unspecified: Secondary | ICD-10-CM

## 2016-02-19 ENCOUNTER — Telehealth: Payer: Self-pay | Admitting: Nurse Practitioner

## 2016-02-19 NOTE — Telephone Encounter (Signed)
Sent to Dr Evette Doffing covering for [facility]

## 2016-02-19 NOTE — Telephone Encounter (Signed)
Forwarding to PCP/MMM 

## 2016-02-20 NOTE — Telephone Encounter (Signed)
Please call in ambien with 1 refills 

## 2016-03-06 ENCOUNTER — Other Ambulatory Visit: Payer: Self-pay | Admitting: Nurse Practitioner

## 2016-03-06 DIAGNOSIS — M797 Fibromyalgia: Secondary | ICD-10-CM

## 2016-03-07 MED ORDER — HYDROCODONE-ACETAMINOPHEN 10-325 MG PO TABS: 1.0000 | ORAL_TABLET | Freq: Two times a day (BID) | ORAL | 0 refills | Status: DC

## 2016-03-07 MED ORDER — KETOROLAC TROMETHAMINE 10 MG PO TABS: 10.0000 mg | ORAL_TABLET | Freq: Four times a day (QID) | ORAL | 0 refills | Status: DC | PRN

## 2016-03-07 NOTE — Telephone Encounter (Signed)
rx ready for pickup 

## 2016-03-23 ENCOUNTER — Other Ambulatory Visit: Payer: Self-pay | Admitting: Pediatrics

## 2016-03-27 ENCOUNTER — Encounter: Payer: Self-pay | Admitting: Nurse Practitioner

## 2016-03-28 ENCOUNTER — Other Ambulatory Visit: Payer: Self-pay | Admitting: Nurse Practitioner

## 2016-03-28 MED ORDER — CYCLOBENZAPRINE HCL 10 MG PO TABS: ORAL_TABLET | ORAL | 2 refills | Status: DC

## 2016-03-29 ENCOUNTER — Other Ambulatory Visit: Payer: Self-pay | Admitting: Pediatrics

## 2016-04-22 ENCOUNTER — Other Ambulatory Visit: Payer: Self-pay | Admitting: *Deleted

## 2016-04-22 DIAGNOSIS — F411 Generalized anxiety disorder: Secondary | ICD-10-CM

## 2016-04-22 MED ORDER — ESCITALOPRAM OXALATE 20 MG PO TABS: ORAL_TABLET | ORAL | 5 refills | Status: DC

## 2016-04-24 ENCOUNTER — Other Ambulatory Visit: Payer: Self-pay | Admitting: Nurse Practitioner

## 2016-04-24 DIAGNOSIS — K5900 Constipation, unspecified: Secondary | ICD-10-CM

## 2016-04-29 ENCOUNTER — Other Ambulatory Visit: Payer: Self-pay | Admitting: Nurse Practitioner

## 2016-04-29 DIAGNOSIS — G4700 Insomnia, unspecified: Secondary | ICD-10-CM

## 2016-04-30 ENCOUNTER — Other Ambulatory Visit: Payer: Self-pay | Admitting: Nurse Practitioner

## 2016-04-30 DIAGNOSIS — F411 Generalized anxiety disorder: Secondary | ICD-10-CM

## 2016-04-30 MED ORDER — ALPRAZOLAM 0.25 MG PO TABS: 0.2500 mg | ORAL_TABLET | Freq: Two times a day (BID) | ORAL | 1 refills | Status: DC

## 2016-04-30 NOTE — Telephone Encounter (Signed)
Last filled 03/23/16, last seen 01/29/16. Route to pool

## 2016-04-30 NOTE — Telephone Encounter (Signed)
Please call in ambien with 2 refills 

## 2016-04-30 NOTE — Telephone Encounter (Signed)
[name], I have called in the patient's Ambien but she was also asking about refills on her Xanax.  Please advise.

## 2016-04-30 NOTE — Progress Notes (Signed)
Also cal in xanax 0.25 I po BID #60  With 1 refill

## 2016-05-11 ENCOUNTER — Other Ambulatory Visit: Payer: Self-pay | Admitting: Nurse Practitioner

## 2016-05-11 DIAGNOSIS — M797 Fibromyalgia: Secondary | ICD-10-CM

## 2016-05-15 ENCOUNTER — Other Ambulatory Visit: Payer: Self-pay | Admitting: Nurse Practitioner

## 2016-05-15 DIAGNOSIS — M797 Fibromyalgia: Secondary | ICD-10-CM

## 2016-05-16 ENCOUNTER — Encounter: Payer: Self-pay | Admitting: Nurse Practitioner

## 2016-05-16 ENCOUNTER — Ambulatory Visit (INDEPENDENT_AMBULATORY_CARE_PROVIDER_SITE_OTHER): Payer: Commercial Managed Care - HMO | Admitting: Nurse Practitioner

## 2016-05-16 DIAGNOSIS — Z0289 Encounter for other administrative examinations: Secondary | ICD-10-CM | POA: Insufficient documentation

## 2016-05-16 DIAGNOSIS — F1120 Opioid dependence, uncomplicated: Secondary | ICD-10-CM | POA: Diagnosis not present

## 2016-05-16 DIAGNOSIS — M797 Fibromyalgia: Secondary | ICD-10-CM | POA: Diagnosis not present

## 2016-05-16 MED ORDER — HYDROCODONE-ACETAMINOPHEN 10-325 MG PO TABS: 1.0000 | ORAL_TABLET | Freq: Three times a day (TID) | ORAL | 0 refills | Status: DC | PRN

## 2016-05-16 MED ORDER — HYDROCODONE-ACETAMINOPHEN 10-325 MG PO TABS: 1.0000 | ORAL_TABLET | Freq: Two times a day (BID) | ORAL | 0 refills | Status: DC

## 2016-05-16 NOTE — Progress Notes (Signed)
   Subjective:    Patient ID: [name], female    DOB: 1960/07/21, 55 y.o.   MRN: [id number]  HPI Patient comes in today for pain management. SHe is doing well right now- her husband died 2 weeks ago and she is in the grieving process.  Pain assessment: Cause of pain- fibromyalgia Pain location- varies form day to day Pain on scale of 1-10-  8/10currently Frequency- daily What increases pain-sitting and doing nothing What makes pain Better-exercise and staying busy Effects on ADL - she is usually able to get things done despite pain Any change in general medical condition-none  Current medications- norco 10-325 BID Effectiveness of current meds-help with pain but never pain free Adverse reactions form pain meds- none Morphine equivalent- 10  Pill count performed-Yes U[place] drug screen- No Was the [place] reviewed- yes  If yes were their any concerning findings? - no concerns     Review of Systems  Constitutional: Negative.   HENT: Negative.   Respiratory: Negative.   Cardiovascular: Negative.   Genitourinary: Negative.   Neurological: Negative.   Psychiatric/Behavioral: Negative.   All other systems reviewed and are negative.      Objective:   Physical Exam  Constitutional: She is oriented to person, place, and time. She appears well-developed and well-nourished. No distress.  Cardiovascular: Normal rate and regular rhythm.   Pulmonary/Chest: Effort normal and breath sounds normal.  Musculoskeletal:  Moving slowly- rises slowly form sitting to standing  Neurological: She is alert and oriented to person, place, and time.  Skin: Skin is warm.  Psychiatric: She has a normal mood and affect. Her behavior is normal. Judgment and thought content normal.   BP 126/75   Pulse 98   Temp 97 F (36.1 C) (Oral)   Ht 5\' 8"  (1.727 m)   Wt 177 lb (80.3 kg)   BMI 26.91 kg/m         Assessment & Plan:   1. Fibromyalgia   2. Pain management contract agreement   3.  Uncomplicated opioid dependence ([place])    Meds ordered this encounter  Medications  . Multiple Vitamin (MULTIVITAMIN) tablet    Sig: Take 1 tablet by mouth daily.  Marland Kitchen HYDROcodone-acetaminophen (NORCO) 10-325 MG tablet    Sig: Take 1 tablet by mouth every 8 (eight) hours as needed.    Dispense:  60 tablet    Refill:  0    DO NOT FILL TILL 06/15/16    Order Specific Question:   Supervising Provider    Answer:   Assunta Found L [[id number]]  . HYDROcodone-acetaminophen (NORCO) 10-325 MG tablet    Sig: Take 1 tablet by mouth 2 (two) times daily.    Dispense:  60 tablet    Refill:  0    Order Specific Question:   Supervising Provider    Answer:   VINCENT, CAROL L [[id number]]  . HYDROcodone-acetaminophen (NORCO) 10-325 MG tablet    Sig: Take 1 tablet by mouth 2 (two) times daily.    Dispense:  60 tablet    Refill:  0    Do not fill till 07/15/16    Order Specific Question:   Supervising Provider    Answer:   Assunta Found L [[id number]]   Moist heat  Rest RTO in 3 months  Mary-Margaret Hassell Done, FNP

## 2016-06-05 ENCOUNTER — Encounter: Payer: Commercial Managed Care - HMO | Admitting: *Deleted

## 2016-06-14 ENCOUNTER — Other Ambulatory Visit: Payer: Self-pay | Admitting: Nurse Practitioner

## 2016-06-14 DIAGNOSIS — K219 Gastro-esophageal reflux disease without esophagitis: Secondary | ICD-10-CM

## 2016-07-19 ENCOUNTER — Other Ambulatory Visit: Payer: Self-pay | Admitting: Nurse Practitioner

## 2016-07-19 MED ORDER — KETOROLAC TROMETHAMINE 10 MG PO TABS: 10.0000 mg | ORAL_TABLET | Freq: Four times a day (QID) | ORAL | 0 refills | Status: DC | PRN

## 2016-07-19 NOTE — Telephone Encounter (Signed)
Sent electronically 

## 2016-08-03 ENCOUNTER — Other Ambulatory Visit: Payer: Self-pay | Admitting: Nurse Practitioner

## 2016-08-03 DIAGNOSIS — G4700 Insomnia, unspecified: Secondary | ICD-10-CM

## 2016-08-05 ENCOUNTER — Other Ambulatory Visit: Payer: Self-pay | Admitting: Nurse Practitioner

## 2016-08-05 DIAGNOSIS — F411 Generalized anxiety disorder: Secondary | ICD-10-CM

## 2016-08-05 NOTE — Telephone Encounter (Signed)
Forwarding to PCP/MMM 

## 2016-08-05 NOTE — Telephone Encounter (Signed)
MMM pt - last seen 05/2017. [name] is coverage Route to nurse for phone in

## 2016-08-06 NOTE — Telephone Encounter (Signed)
Rx called in 

## 2016-08-06 NOTE — Telephone Encounter (Signed)
Please call in ambien with 1 refills 

## 2016-09-04 ENCOUNTER — Ambulatory Visit (INDEPENDENT_AMBULATORY_CARE_PROVIDER_SITE_OTHER): Payer: Commercial Managed Care - HMO | Admitting: Nurse Practitioner

## 2016-09-04 ENCOUNTER — Encounter: Payer: Self-pay | Admitting: Nurse Practitioner

## 2016-09-04 VITALS — BP 135/88 | HR 83 | Temp 97.5°F | Ht 68.0 in | Wt 176.0 lb

## 2016-09-04 DIAGNOSIS — Z0000 Encounter for general adult medical examination without abnormal findings: Secondary | ICD-10-CM | POA: Diagnosis not present

## 2016-09-04 DIAGNOSIS — Z1159 Encounter for screening for other viral diseases: Secondary | ICD-10-CM

## 2016-09-04 DIAGNOSIS — Z01419 Encounter for gynecological examination (general) (routine) without abnormal findings: Secondary | ICD-10-CM

## 2016-09-04 DIAGNOSIS — F411 Generalized anxiety disorder: Secondary | ICD-10-CM

## 2016-09-04 DIAGNOSIS — M542 Cervicalgia: Secondary | ICD-10-CM | POA: Diagnosis not present

## 2016-09-04 DIAGNOSIS — F5101 Primary insomnia: Secondary | ICD-10-CM

## 2016-09-04 DIAGNOSIS — K5900 Constipation, unspecified: Secondary | ICD-10-CM

## 2016-09-04 DIAGNOSIS — E7800 Pure hypercholesterolemia, unspecified: Secondary | ICD-10-CM

## 2016-09-04 DIAGNOSIS — F1120 Opioid dependence, uncomplicated: Secondary | ICD-10-CM

## 2016-09-04 DIAGNOSIS — M797 Fibromyalgia: Secondary | ICD-10-CM

## 2016-09-04 DIAGNOSIS — K219 Gastro-esophageal reflux disease without esophagitis: Secondary | ICD-10-CM

## 2016-09-04 DIAGNOSIS — Z6826 Body mass index (BMI) 26.0-26.9, adult: Secondary | ICD-10-CM

## 2016-09-04 DIAGNOSIS — B009 Herpesviral infection, unspecified: Secondary | ICD-10-CM

## 2016-09-04 MED ORDER — VALACYCLOVIR HCL 1 G PO TABS: 1000.0000 mg | ORAL_TABLET | Freq: Every day | ORAL | 0 refills | Status: DC

## 2016-09-04 MED ORDER — ESCITALOPRAM OXALATE 20 MG PO TABS: ORAL_TABLET | ORAL | 5 refills | Status: DC

## 2016-09-04 MED ORDER — ZOLPIDEM TARTRATE 10 MG PO TABS: 10.0000 mg | ORAL_TABLET | Freq: Every day | ORAL | 2 refills | Status: DC

## 2016-09-04 MED ORDER — LINACLOTIDE 145 MCG PO CAPS: 145.0000 ug | ORAL_CAPSULE | Freq: Every day | ORAL | 1 refills | Status: DC

## 2016-09-04 MED ORDER — HYDROCODONE-ACETAMINOPHEN 10-325 MG PO TABS: 1.0000 | ORAL_TABLET | Freq: Two times a day (BID) | ORAL | 0 refills | Status: DC

## 2016-09-04 MED ORDER — METHYLPREDNISOLONE ACETATE 80 MG/ML IJ SUSP
80.0000 mg | Freq: Once | INTRAMUSCULAR | Status: AC
  Administered 2016-09-04: 80 mg via INTRAMUSCULAR

## 2016-09-04 MED ORDER — HYDROCODONE-ACETAMINOPHEN 10-325 MG PO TABS: 1.0000 | ORAL_TABLET | Freq: Three times a day (TID) | ORAL | 0 refills | Status: DC | PRN

## 2016-09-04 MED ORDER — ALPRAZOLAM 0.25 MG PO TABS: 0.2500 mg | ORAL_TABLET | Freq: Two times a day (BID) | ORAL | 1 refills | Status: DC

## 2016-09-04 MED ORDER — CYCLOBENZAPRINE HCL 10 MG PO TABS
ORAL_TABLET | ORAL | 2 refills | Status: DC
Start: 2016-09-04 — End: 2017-09-05

## 2016-09-04 MED ORDER — PREDNISONE 20 MG PO TABS: ORAL_TABLET | ORAL | 0 refills | Status: DC

## 2016-09-04 MED ORDER — PRAVASTATIN SODIUM 40 MG PO TABS: 40.0000 mg | ORAL_TABLET | Freq: Every day | ORAL | 1 refills | Status: DC

## 2016-09-04 NOTE — Progress Notes (Signed)
Subjective:    Patient ID: [name], female    DOB: 05-13-1961, 56 y.o.   MRN: [id number]  Patient here today for annual physical, PAP and follow up of chronic medical problems. She is doing well with out complaints today. Her husband died recently of brain tumor and she is dealing with it quite well.  Outpatient Encounter Prescriptions as of 09/04/2016  Medication Sig  . ALPRAZolam (XANAX) 0.25 MG tablet Take 1 tablet (0.25 mg total) by mouth 2 (two) times daily.  . cyclobenzaprine (FLEXERIL) 10 MG tablet TAKE 1 TABLET (10 MG TOTAL) BY MOUTH 3 (THREE) TIMES DAILY AS NEEDED FOR MUSCLE SPASMS.  Marland Kitchen EPINEPHrine 0.3 mg/0.3 mL IJ SOAJ injection Inject 0.3 mLs (0.3 mg total) into the muscle once.  . escitalopram (LEXAPRO) 20 MG tablet TAKE 1 TABLET (20 MG TOTAL) BY MOUTH DAILY.      Marland Kitchen HYDROcodone-acetaminophen (NORCO) 10-325 MG tablet Take 1 tablet by mouth every 8 (eight) hours as needed.  Marland Kitchen HYDROcodone-acetaminophen (NORCO) 10-325 MG tablet Take 1 tablet by mouth 2 (two) times daily.  Marland Kitchen HYDROcodone-acetaminophen (NORCO) 10-325 MG tablet Take 1 tablet by mouth 2 (two) times daily.  Marland Kitchen LINZESS 145 MCG CAPS capsule TAKE 1 CAPSULE (145 MCG TOTAL) BY MOUTH DAILY. IN THE MORNING WITH FOOD  . loratadine (CLARITIN) 10 MG tablet Take 10 mg by mouth daily.  . meclizine (ANTIVERT) 25 MG tablet Take 1 tablet (25 mg total) by mouth 3 (three) times daily as needed for dizziness.  . Multiple Vitamin (MULTIVITAMIN) tablet Take 1 tablet by mouth daily.  . ondansetron (ZOFRAN) 4 MG tablet Take 1 tablet (4 mg total) by mouth every 8 (eight) hours as needed for nausea or vomiting.  . pantoprazole (PROTONIX) 40 MG tablet TAKE 1 TABLET (40 MG TOTAL) BY MOUTH 2 (TWO) TIMES DAILY.  . pravastatin (PRAVACHOL) 40 MG tablet Take 1 tablet (40 mg total) by mouth daily.  Marland Kitchen SAVELLA 50 MG TABS tablet TAKE 1 TABLET (50 MG TOTAL) BY MOUTH 2 (TWO) TIMES DAILY.  . SUMAtriptan (IMITREX) 100 MG tablet TAKE 1 TABLET EVERY 2 HOURS AS  NEEDED FOR MIGRAINES  . valACYclovir (VALTREX) 1000 MG tablet TAKE 1 TABLET EVERY DAY  . zolpidem (AMBIEN) 10 MG tablet TAKE 1 TABLET AT BEDTIME   No facility-administered encounter medications on file as of 09/04/2016.     Hyperlipidemia  This is a chronic problem. The current episode started more than 1 year ago. The problem is controlled. Recent lipid tests were reviewed and are normal. Associated symptoms include myalgias. Current antihyperlipidemic treatment includes statins. The current treatment provides moderate improvement of lipids. Compliance problems include adherence to diet and adherence to exercise.  Risk factors for coronary artery disease include dyslipidemia and post-menopausal.  Gastroesophageal Reflux  This is a chronic problem. The problem occurs frequently. Associated symptoms include fatigue. She has tried a histamine-2 antagonist for the symptoms. The treatment provided moderate relief.  Fibromylagia  Patient currently on savella and norco- doing ok- Has lost weight which has helped with some of her pain. Would like a shot today. Pain has really gotten out of control since her husband died- rates 10/10 today Insomnia ambien working well feels rested in AM.    Review of Systems  Constitutional: Positive for fatigue.  Respiratory: Negative.   Cardiovascular: Negative.   Genitourinary: Negative.   Musculoskeletal: Positive for myalgias.  All other systems reviewed and are negative.      Objective:   Physical Exam  Constitutional:  She is oriented to person, place, and time. She appears well-developed and well-nourished.  HENT:  Head: Normocephalic.  Right Ear: Hearing, tympanic membrane, external ear and ear canal normal.  Left Ear: Hearing, tympanic membrane, external ear and ear canal normal.  Nose: Nose normal.  Mouth/Throat: Uvula is midline and oropharynx is clear and moist.  Eyes: Conjunctivae and EOM are normal. Pupils are equal, round, and reactive to  light.  Neck: Trachea normal, normal range of motion and full passive range of motion without pain. Neck supple. No JVD present. Carotid bruit is not present. No thyroid mass and no thyromegaly present.  Cardiovascular: Normal rate, regular rhythm, normal heart sounds and intact distal pulses.  Exam reveals no gallop and no friction rub.   No murmur heard. Pulmonary/Chest: Effort normal and breath sounds normal. Right breast exhibits no inverted nipple, no mass, no nipple discharge, no skin change and no tenderness. Left breast exhibits no inverted nipple, no mass, no nipple discharge, no skin change and no tenderness.  Abdominal: Soft. Bowel sounds are normal. She exhibits no distension and no mass. There is no tenderness.  Genitourinary: Vagina normal and uterus normal. No breast swelling, tenderness, discharge or bleeding. No vaginal discharge found.  Genitourinary Comments: bimanual exam-No adnexal masses or tenderness. Vaginal cuff intact  Musculoskeletal: Normal range of motion.  Lymphadenopathy:    She has no cervical adenopathy.  Neurological: She is alert and oriented to person, place, and time. She has normal reflexes.  Skin: Skin is warm and dry.  Psychiatric: She has a normal mood and affect. Her behavior is normal. Judgment and thought content normal.    BP 135/88   Pulse 83   Temp 97.5 F (36.4 C) (Oral)   Ht '5\' 8"'$  (1.727 m)   Wt 176 lb (79.8 kg)   BMI 26.76 kg/m       Assessment & Plan:  1. Annual physical exam - CMP14+EGFR - CBC with Differential/Platelet - Thyroid Panel With TSH  2. Gynecologic exam normal - IGP, Aptima HPV, rfx 16/18,45  3. Gastroesophageal reflux disease, esophagitis presence not specified Avoid spicy foods Do not eat 2 hours prior to bedtime  4. BMI 26.0-26.9,adult Discussed diet and exercise for person with BMI >25 Will recheck weight in 3-6 months  5. Fibromyalgia exercise - HYDROcodone-acetaminophen (NORCO) 10-325 MG tablet; Take  1 tablet by mouth every 8 (eight) hours as needed.  Dispense: 60 tablet; Refill: 0 - HYDROcodone-acetaminophen (NORCO) 10-325 MG tablet; Take 1 tablet by mouth 2 (two) times daily.  Dispense: 60 tablet; Refill: 0 - HYDROcodone-acetaminophen (NORCO) 10-325 MG tablet; Take 1 tablet by mouth 2 (two) times daily.  Dispense: 60 tablet; Refill: 0 - methylPREDNISolone acetate (DEPO-MEDROL) injection 80 mg; Inject 1 mL (80 mg total) into the muscle once. - predniSONE (DELTASONE) 20 MG tablet; 2 po at sametime daily for 5 days- start tomorrow  Dispense: 10 tablet; Refill: 0  6. GAD (generalized anxiety disorder) Stress management - escitalopram (LEXAPRO) 20 MG tablet; TAKE 1 TABLET (20 MG TOTAL) BY MOUTH DAILY.  Dispense: 30 tablet; Refill: 5 - ALPRAZolam (XANAX) 0.25 MG tablet; Take 1 tablet (0.25 mg total) by mouth 2 (two) times daily.  Dispense: 60 tablet; Refill: 1  7. Pure hypercholesterolemia Low fat diet - pravastatin (PRAVACHOL) 40 MG tablet; Take 1 tablet (40 mg total) by mouth daily.  Dispense: 90 tablet; Refill: 1 - Lipid panel  8. Primary insomnia Bedtime routine - zolpidem (AMBIEN) 10 MG tablet; Take 1 tablet (10 mg  total) by mouth at bedtime.  Dispense: 30 tablet; Refill: 2  9. Neck pain Moist heat  10. Uncomplicated opioid dependence ([place])  11. Constipation, unspecified constipation type - linaclotide (LINZESS) 145 MCG CAPS capsule; Take 1 capsule (145 mcg total) by mouth daily. In the morning with food  Dispense: 90 capsule; Refill: 1  12. HSV-2 infection - valACYclovir (VALTREX) 1000 MG tablet; Take 1 tablet (1,000 mg total) by mouth daily.  Dispense: 90 tablet; Refill: 0  13. Need for hepatitis C screening test - Hepatitis C antibody    Labs pending Health maintenance reviewed Diet and exercise encouraged Continue all meds Follow up  In 6 months   [place], FNP

## 2016-09-05 LAB — CMP14+EGFR
A/G RATIO: 2 (ref 1.2–2.2)
ALK PHOS: 99 IU/L (ref 39–117)
ALT: 48 IU/L — AB (ref 0–32)
AST: 44 IU/L — AB (ref 0–40)
Albumin: 4.3 g/dL (ref 3.5–5.5)
BUN/Creatinine Ratio: 15 (ref 9–23)
BUN: 11 mg/dL (ref 6–24)
Bilirubin Total: <0.2 mg/dL (ref 0.0–1.2)
CALCIUM: 9.2 mg/dL (ref 8.7–10.2)
CO2: 28 mmol/L (ref 18–29)
CREATININE: 0.71 mg/dL (ref 0.57–1.00)
Chloride: 100 mmol/L (ref 96–106)
GFR calc Af Amer: 109 mL/min/{1.73_m2} (ref >59)
GFR, EST NON AFRICAN AMERICAN: 94 mL/min/{1.73_m2} (ref >59)
GLOBULIN, TOTAL: 2.2 g/dL (ref 1.5–4.5)
Glucose: 75 mg/dL (ref 65–99)
POTASSIUM: 3.9 mmol/L (ref 3.5–5.2)
SODIUM: 143 mmol/L (ref 134–144)
Total Protein: 6.5 g/dL (ref 6.0–8.5)

## 2016-09-05 LAB — CBC WITH DIFFERENTIAL/PLATELET
Basophils Absolute: 0 10*3/uL (ref 0.0–0.2)
Basos: 0 %
EOS (ABSOLUTE): 0.1 10*3/uL (ref 0.0–0.4)
Eos: 1 %
Hematocrit: 40.6 % (ref 34.0–46.6)
Hemoglobin: 13.3 g/dL (ref 11.1–15.9)
IMMATURE GRANS (ABS): 0 10*3/uL (ref 0.0–0.1)
IMMATURE GRANULOCYTES: 0 %
LYMPHS: 40 %
Lymphocytes Absolute: 2.3 10*3/uL (ref 0.7–3.1)
MCH: 28.8 pg (ref 26.6–33.0)
MCHC: 32.8 g/dL (ref 31.5–35.7)
MCV: 88 fL (ref 79–97)
Monocytes Absolute: 0.6 10*3/uL (ref 0.1–0.9)
Monocytes: 10 %
NEUTROS PCT: 49 %
Neutrophils Absolute: 2.8 10*3/uL (ref 1.4–7.0)
PLATELETS: 212 10*3/uL (ref 150–379)
RBC: 4.62 x10E6/uL (ref 3.77–5.28)
RDW: 13.4 % (ref 12.3–15.4)
WBC: 5.7 10*3/uL (ref 3.4–10.8)

## 2016-09-05 LAB — THYROID PANEL WITH TSH
FREE THYROXINE INDEX: 1.6 (ref 1.2–4.9)
T3 Uptake Ratio: 24 % (ref 24–39)
T4 TOTAL: 6.8 ug/dL (ref 4.5–12.0)
TSH: 1.53 u[IU]/mL (ref 0.450–4.500)

## 2016-09-05 LAB — LIPID PANEL
Chol/HDL Ratio: 3 ratio units (ref 0.0–4.4)
Cholesterol, Total: 120 mg/dL (ref 100–199)
HDL: 40 mg/dL (ref >39)
LDL Calculated: 50 mg/dL (ref 0–99)
Triglycerides: 149 mg/dL (ref 0–149)
VLDL CHOLESTEROL CAL: 30 mg/dL (ref 5–40)

## 2016-09-05 LAB — HEPATITIS C ANTIBODY

## 2016-09-09 LAB — IGP, APTIMA HPV, RFX 16/18,45
HPV APTIMA: NEGATIVE
PAP SMEAR COMMENT: 0

## 2016-10-05 ENCOUNTER — Other Ambulatory Visit: Payer: Self-pay | Admitting: Nurse Practitioner

## 2016-10-05 DIAGNOSIS — E785 Hyperlipidemia, unspecified: Secondary | ICD-10-CM

## 2016-10-22 ENCOUNTER — Other Ambulatory Visit: Payer: Self-pay

## 2016-10-22 DIAGNOSIS — F411 Generalized anxiety disorder: Secondary | ICD-10-CM

## 2016-10-22 MED ORDER — ESCITALOPRAM OXALATE 20 MG PO TABS: ORAL_TABLET | ORAL | 0 refills | Status: DC

## 2016-11-03 ENCOUNTER — Other Ambulatory Visit: Payer: Self-pay | Admitting: Nurse Practitioner

## 2016-11-03 DIAGNOSIS — F411 Generalized anxiety disorder: Secondary | ICD-10-CM

## 2016-11-07 ENCOUNTER — Other Ambulatory Visit: Payer: Self-pay | Admitting: Pediatrics

## 2016-12-01 ENCOUNTER — Other Ambulatory Visit: Payer: Self-pay | Admitting: Nurse Practitioner

## 2016-12-01 DIAGNOSIS — B009 Herpesviral infection, unspecified: Secondary | ICD-10-CM

## 2016-12-03 ENCOUNTER — Encounter: Payer: Self-pay | Admitting: Nurse Practitioner

## 2016-12-03 ENCOUNTER — Ambulatory Visit (INDEPENDENT_AMBULATORY_CARE_PROVIDER_SITE_OTHER): Payer: 59 | Admitting: Nurse Practitioner

## 2016-12-03 VITALS — BP 120/80 | HR 89 | Temp 98.1°F | Ht 68.0 in | Wt 182.0 lb

## 2016-12-03 DIAGNOSIS — M79604 Pain in right leg: Secondary | ICD-10-CM | POA: Diagnosis not present

## 2016-12-03 MED ORDER — PREDNISONE 20 MG PO TABS: ORAL_TABLET | ORAL | 0 refills | Status: DC

## 2016-12-03 MED ORDER — METHYLPREDNISOLONE ACETATE 80 MG/ML IJ SUSP
80.0000 mg | Freq: Once | INTRAMUSCULAR | Status: AC
  Administered 2016-12-03: 80 mg via INTRAMUSCULAR

## 2016-12-03 NOTE — Progress Notes (Signed)
   Subjective:    Patient ID: KRISHIKA BUGGE, female    DOB: 01-29-1961, 56 y.o.   MRN: [id number]  HPI Patient comes into the office with complaints of a painful area to her right lower leg.  Area is not reddened, but patient states that she has a burning pain for about 2 weeks.  Patient has been using cold compresses and elevated the limb without symptom improvement.  Pain sometimes radiates to posterior knee and thigh, but this is described as intermittent.   Review of Systems  Constitutional: Negative for activity change, fatigue and fever.  Respiratory: Negative.   Cardiovascular: Negative.   Gastrointestinal: Negative.   Musculoskeletal:       Right anterior lower leg burning and warmth   Skin: Negative for rash and wound.  All other systems reviewed and are negative.      Objective:   Physical Exam  Constitutional: She is oriented to person, place, and time. She appears well-developed and well-nourished. No distress.  HENT:  Head: Normocephalic and atraumatic.  Eyes: Pupils are equal, round, and reactive to light.  Neck: Normal range of motion. Neck supple. No thyromegaly present.  Cardiovascular: Normal rate, regular rhythm, normal heart sounds and intact distal pulses.   No murmur heard. Pulmonary/Chest: Effort normal and breath sounds normal. No respiratory distress.  Abdominal: Soft. Bowel sounds are normal. She exhibits no distension. There is no tenderness.  Musculoskeletal: Normal range of motion.  5 cm anular area on right anterior shin that is warm to touch.  No swelling or redness.  Lymphadenopathy:    She has no cervical adenopathy.  Neurological: She is alert and oriented to person, place, and time.  Skin: Skin is warm and dry. No rash noted. No erythema.  Psychiatric: She has a normal mood and affect. Her behavior is normal. Judgment and thought content normal.   BP 120/80   Pulse 89   Temp 98.1 F (36.7 C) (Oral)   Ht 5\' 8"  (1.727 m)   Wt 182 lb (82.6  kg)   BMI 27.67 kg/m      Assessment & Plan:  1. Pain of right lower extremity Ice area RTO if no better - methylPREDNISolone acetate (DEPO-MEDROL) injection 80 mg; Inject 1 mL (80 mg total) into the muscle once. - predniSONE (DELTASONE) 20 MG tablet; 2 po at sametime daily for 5 days- start tomorrow  Dispense: 10 tablet; Refill: 0  Mary-Margaret Hassell Done, FNP

## 2016-12-06 ENCOUNTER — Other Ambulatory Visit: Payer: Self-pay | Admitting: Nurse Practitioner

## 2016-12-06 DIAGNOSIS — M797 Fibromyalgia: Secondary | ICD-10-CM

## 2016-12-06 DIAGNOSIS — K219 Gastro-esophageal reflux disease without esophagitis: Secondary | ICD-10-CM

## 2017-01-04 ENCOUNTER — Other Ambulatory Visit: Payer: Self-pay | Admitting: Nurse Practitioner

## 2017-01-04 DIAGNOSIS — K219 Gastro-esophageal reflux disease without esophagitis: Secondary | ICD-10-CM

## 2017-01-07 ENCOUNTER — Other Ambulatory Visit: Payer: Self-pay | Admitting: Nurse Practitioner

## 2017-01-07 DIAGNOSIS — M797 Fibromyalgia: Secondary | ICD-10-CM

## 2017-01-07 MED ORDER — HYDROCODONE-ACETAMINOPHEN 10-325 MG PO TABS: 1.0000 | ORAL_TABLET | Freq: Three times a day (TID) | ORAL | 0 refills | Status: DC | PRN

## 2017-01-07 NOTE — Progress Notes (Signed)
Pt notified of RX Has appt on 01/22/2017 Please let patiernt know pain med is ready to be picked up-tell her she will need to be seen for next refill

## 2017-01-22 ENCOUNTER — Ambulatory Visit (INDEPENDENT_AMBULATORY_CARE_PROVIDER_SITE_OTHER): Payer: 59 | Admitting: Nurse Practitioner

## 2017-01-22 ENCOUNTER — Other Ambulatory Visit: Payer: Self-pay | Admitting: Nurse Practitioner

## 2017-01-22 ENCOUNTER — Encounter: Payer: Self-pay | Admitting: Nurse Practitioner

## 2017-01-22 VITALS — BP 132/89 | HR 90 | Temp 97.3°F | Ht 68.0 in | Wt 183.0 lb

## 2017-01-22 DIAGNOSIS — Z6826 Body mass index (BMI) 26.0-26.9, adult: Secondary | ICD-10-CM | POA: Diagnosis not present

## 2017-01-22 DIAGNOSIS — K219 Gastro-esophageal reflux disease without esophagitis: Secondary | ICD-10-CM | POA: Diagnosis not present

## 2017-01-22 DIAGNOSIS — F411 Generalized anxiety disorder: Secondary | ICD-10-CM

## 2017-01-22 DIAGNOSIS — M797 Fibromyalgia: Secondary | ICD-10-CM

## 2017-01-22 DIAGNOSIS — S0300XS Dislocation of jaw, unspecified side, sequela: Secondary | ICD-10-CM

## 2017-01-22 DIAGNOSIS — B009 Herpesviral infection, unspecified: Secondary | ICD-10-CM

## 2017-01-22 DIAGNOSIS — E7800 Pure hypercholesterolemia, unspecified: Secondary | ICD-10-CM | POA: Diagnosis not present

## 2017-01-22 DIAGNOSIS — M26603 Bilateral temporomandibular joint disorder, unspecified: Secondary | ICD-10-CM

## 2017-01-22 DIAGNOSIS — G43109 Migraine with aura, not intractable, without status migrainosus: Secondary | ICD-10-CM

## 2017-01-22 DIAGNOSIS — F5101 Primary insomnia: Secondary | ICD-10-CM

## 2017-01-22 DIAGNOSIS — R2232 Localized swelling, mass and lump, left upper limb: Secondary | ICD-10-CM

## 2017-01-22 DIAGNOSIS — N6320 Unspecified lump in the left breast, unspecified quadrant: Secondary | ICD-10-CM

## 2017-01-22 MED ORDER — HYDROCODONE-ACETAMINOPHEN 10-325 MG PO TABS: 1.0000 | ORAL_TABLET | Freq: Three times a day (TID) | ORAL | 0 refills | Status: DC

## 2017-01-22 MED ORDER — KETOROLAC TROMETHAMINE 60 MG/2ML IM SOLN
60.0000 mg | Freq: Once | INTRAMUSCULAR | Status: AC
  Administered 2017-01-22: 60 mg via INTRAMUSCULAR

## 2017-01-22 MED ORDER — PRAVASTATIN SODIUM 40 MG PO TABS: 40.0000 mg | ORAL_TABLET | Freq: Every day | ORAL | 1 refills | Status: DC

## 2017-01-22 MED ORDER — VALACYCLOVIR HCL 1 G PO TABS: 1000.0000 mg | ORAL_TABLET | Freq: Every day | ORAL | 0 refills | Status: DC

## 2017-01-22 MED ORDER — ESCITALOPRAM OXALATE 20 MG PO TABS: ORAL_TABLET | ORAL | 5 refills | Status: DC

## 2017-01-22 NOTE — Patient Instructions (Signed)
Myofascial Pain Syndrome and Fibromyalgia Myofascial pain syndrome and fibromyalgia are both pain disorders. This pain may be felt mainly in your muscles.  Myofascial pain syndrome: ? Always has trigger points or tender points in the muscle that will cause pain when pressed. The pain may come and go. ? Usually affects your neck, upper back, and shoulder areas. The pain often radiates into your arms and hands.  Fibromyalgia: ? Has muscle pains and tenderness that come and go. ? Is often associated with fatigue and sleep disturbances. ? Has trigger points. ? Tends to be long-lasting (chronic), but is not life-threatening.  Fibromyalgia and myofascial pain are not the same. However, they often occur together. If you have both conditions, each can make the other worse. Both are common and can cause enough pain and fatigue to make day-to-day activities difficult. What are the causes? The exact causes of fibromyalgia and myofascial pain are not known. People with certain gene types may be more likely to develop fibromyalgia. Some factors can be triggers for both conditions, such as:  Spine disorders.  Arthritis.  Severe injury (trauma) and other physical stressors.  Being under a lot of stress.  A medical illness.  What are the signs or symptoms? Fibromyalgia The main symptom of fibromyalgia is widespread pain and tenderness in your muscles. This can vary over time. Pain is sometimes described as stabbing, shooting, or burning. You may have tingling or numbness, too. You may also have sleep problems and fatigue. You may wake up feeling tired and groggy (fibro fog). Other symptoms may include:  Bowel and bladder problems.  Headaches.  Visual problems.  Problems with odors and noises.  Depression or mood changes.  Painful menstrual periods (dysmenorrhea).  Dry skin or eyes.  Myofascial pain syndrome Symptoms of myofascial pain syndrome include:  Tight, ropy bands of  muscle.  Uncomfortable sensations in muscular areas, such as: ? Aching. ? Cramping. ? Burning. ? Numbness. ? Tingling. ? Muscle weakness.  Trouble moving certain muscles freely (range of motion).  How is this diagnosed? There are no specific tests to diagnose fibromyalgia or myofascial pain syndrome. Both can be hard to diagnose because their symptoms are common in many other conditions. Your health care provider may suspect one or both of these conditions based on your symptoms and medical history. Your health care provider will also do a physical exam. The key to diagnosing fibromyalgia is having pain, fatigue, and other symptoms for more than three months that cannot be explained by another condition. The key to diagnosing myofascial pain syndrome is finding trigger points in muscles that are tender and cause pain elsewhere in your body (referred pain). How is this treated? Treating fibromyalgia and myofascial pain often requires a team of health care providers. This usually starts with your primary provider and a physical therapist. You may also find it helpful to work with alternative health care providers, such as massage therapists or acupuncturists. Treatment for fibromyalgia may include medicines. This may include nonsteroidal anti-inflammatory drugs (NSAIDs), along with other medicines. Treatment for myofascial pain may also include:  NSAIDs.  Cooling and stretching of muscles.  Trigger point injections.  Sound wave (ultrasound) treatments to stimulate muscles.  Follow these instructions at home:  Take medicines only as directed by your health care provider.  Exercise as directed by your health care provider or physical therapist.  Try to avoid stressful situations.  Practice relaxation techniques to control your stress. You may want to try: ? Biofeedback. ? Visual   imagery. ? Hypnosis. ? Muscle relaxation. ? Yoga. ? Meditation.  Talk to your health care provider  about alternative treatments, such as acupuncture or massage treatment.  Maintain a healthy lifestyle. This includes eating a healthy diet and getting enough sleep.  Consider joining a support group.  Do not do activities that stress or strain your muscles. That includes repetitive motions and heavy lifting. Where to find more information:  National Fibromyalgia Association: www.fmaware.org  Arthritis Foundation: www.arthritis.org  American Chronic Pain Association: www.theacpa.org/condition/myofascial-pain Contact a health care provider if:  You have new symptoms.  Your symptoms get worse.  You have side effects from your medicines.  You have trouble sleeping.  Your condition is causing depression or anxiety. This information is not intended to replace advice given to you by your health care provider. Make sure you discuss any questions you have with your health care provider. Document Released: 05/27/2005 Document Revised: 11/02/2015 Document Reviewed: 03/02/2014 Elsevier Interactive Patient Education  2018 Elsevier Inc.  

## 2017-01-22 NOTE — Progress Notes (Signed)
Subjective:    Patient ID: [name], female    DOB: 01/07/1961, 56 y.o.   MRN: [id number]  HPI  [name] is here today for follow up of chronic medical problem.  Outpatient Encounter Prescriptions as of 01/22/2017  Medication Sig  . ALPRAZolam (XANAX) 0.25 MG tablet Take 1 tablet (0.25 mg total) by mouth 2 (two) times daily.  . cyclobenzaprine (FLEXERIL) 10 MG tablet TAKE 1 TABLET (10 MG TOTAL) BY MOUTH 3 (THREE) TIMES DAILY AS NEEDED FOR MUSCLE SPASMS.  Marland Kitchen EPINEPHrine 0.3 mg/0.3 mL IJ SOAJ injection Inject 0.3 mLs (0.3 mg total) into the muscle once.  . escitalopram (LEXAPRO) 20 MG tablet TAKE 1 TABLET (20 MG TOTAL) BY MOUTH DAILY.  Marland Kitchen HYDROcodone-acetaminophen (NORCO) 10-325 MG tablet Take 1 tablet by mouth 2 (two) times daily.  Marland Kitchen HYDROcodone-acetaminophen (NORCO) 10-325 MG tablet Take 1 tablet by mouth 2 (two) times daily.  Marland Kitchen HYDROcodone-acetaminophen (NORCO) 10-325 MG tablet Take 1 tablet by mouth every 8 (eight) hours as needed.  Marland Kitchen ketorolac (TORADOL) 10 MG tablet TAKE 1 TABLET BY MOUTH EVERY SIX HOURS AS NEEDED  . loratadine (CLARITIN) 10 MG tablet Take 10 mg by mouth daily.  . meclizine (ANTIVERT) 25 MG tablet Take 1 tablet (25 mg total) by mouth 3 (three) times daily as needed for dizziness.  . Multiple Vitamin (MULTIVITAMIN) tablet Take 1 tablet by mouth daily.  . ondansetron (ZOFRAN) 4 MG tablet Take 1 tablet (4 mg total) by mouth every 8 (eight) hours as needed for nausea or vomiting.  . pantoprazole (PROTONIX) 40 MG tablet TAKE 1 TABLET (40 MG TOTAL) BY MOUTH 2 (TWO) TIMES DAILY.  . pantoprazole (PROTONIX) 40 MG tablet TAKE 1 TABLET (40 MG TOTAL) BY MOUTH 2 (TWO) TIMES DAILY.  . pravastatin (PRAVACHOL) 40 MG tablet TAKE 1 TABLET (40 MG TOTAL) BY MOUTH DAILY.  Marland Kitchen predniSONE (DELTASONE) 20 MG tablet 2 po at sametime daily for 5 days- start tomorrow  . SAVELLA 50 MG TABS tablet TAKE 1 TABLET (50 MG TOTAL) BY MOUTH 2 (TWO) TIMES DAILY.  . SUMAtriptan (IMITREX) 100 MG  tablet TAKE 1 TABLET EVERY 2 HOURS AS NEEDED FOR MIGRAINES  . valACYclovir (VALTREX) 1000 MG tablet TAKE 1 TABLET (1,000 MG TOTAL) BY MOUTH DAILY.   No facility-administered encounter medications on file as of 01/22/2017.     1. Migraine with aura and without status migrainosus, not intractable  Has had no recent migraine. Has had slight headaches but no mgraines  2. Gastroesophageal reflux disease, esophagitis presence not specified  Takes protonix which helps with symptoms  3. Dislocation of temporomandibular joint, sequela  No recent problems with this  4. Primary insomnia  Has had problems sleeping for many years  5. Pure hypercholesterolemia  Does not watch diet  6. GAD (generalized anxiety disorder)  Takes xanax bid  7. Fibromyalgia  Pain assessment: Cause of pain- fibromyalgia Pain location- scatters throughout Pain on scale of 1-10- 8/10 right now- pain has really increased over the last several months Frequency- constant What increases pain-nothing really seems to increase it, it just is what it is What makes pain Better-pain meds help Effects on ADL - still able to get done what she needs to get done Any change in general medical condition-none  Current medications- norco 10/325 bid and savella Effectiveness of current meds-helps some Adverse reactions form pain meds-none Morphine equivalent 20  Pill count performed-No U[place] drug screen- No Was the [place] reviewed- yes  If yes were  their any concerning findings? - no  Pain contract signed on:   8. BMI 26.0-26.9,adult  No recent weight changes    New complaints: Nodule left axillia- has gotten bigger- non tender Social history: Moved to [place] after husband passed away form brain cancer.    Review of Systems  Constitutional: Negative for activity change and appetite change.  HENT: Negative.   Eyes: Negative for pain.  Respiratory: Negative for shortness of breath.   Cardiovascular: Negative for chest  pain, palpitations and leg swelling.  Gastrointestinal: Negative for abdominal pain.  Endocrine: Negative for polydipsia.  Genitourinary: Negative.   Skin: Negative for rash.  Neurological: Negative for dizziness, weakness and headaches.  Hematological: Does not bruise/bleed easily.  Psychiatric/Behavioral: Negative.   All other systems reviewed and are negative.      Objective:   Physical Exam  Constitutional: She is oriented to person, place, and time. She appears well-developed and well-nourished.  HENT:  Nose: Nose normal.  Mouth/Throat: Oropharynx is clear and moist.  Eyes: EOM are normal.  Neck: Trachea normal, normal range of motion and full passive range of motion without pain. Neck supple. No JVD present. Carotid bruit is not present. No thyromegaly present.  Cardiovascular: Normal rate, regular rhythm, normal heart sounds and intact distal pulses.  Exam reveals no gallop and no friction rub.   No murmur heard. Pulmonary/Chest: Effort normal and breath sounds normal. Right breast exhibits inverted nipple, mass, nipple discharge, skin change and tenderness. Left breast exhibits inverted nipple, mass, nipple discharge, skin change and tenderness. Breasts are asymmetrical.  Abdominal: Soft. Bowel sounds are normal. She exhibits no distension and no mass. There is no tenderness.  Musculoskeletal: Normal range of motion.  Tender all over just to light touch  Lymphadenopathy:    She has no cervical adenopathy.  Neurological: She is alert and oriented to person, place, and time. She has normal reflexes.  Skin: Skin is warm and dry.  Left tender axillary nodule- measures about 5cm  Psychiatric: She has a normal mood and affect. Her behavior is normal. Judgment and thought content normal.   BP 132/89   Pulse 90   Temp (!) 97.3 F (36.3 C) (Oral)   Ht '5\' 8"'$  (1.727 m)   Wt 183 lb (83 kg)   BMI 27.83 kg/m       Assessment & Plan:  1. Migraine with aura and without status  migrainosus, not intractable Continue imitrex as needed  2. Gastroesophageal reflux disease, esophagitis presence not specified Avoid spicy foods Do not eat 2 hours prior to bedtime  3. Dislocation of temporomandibular joint, sequela  4. Primary insomnia Bedtime routine  5. Pure hypercholesterolemia Low fat diet - pravastatin (PRAVACHOL) 40 MG tablet; Take 1 tablet (40 mg total) by mouth daily.  Dispense: 90 tablet; Refill: 1 - CMP14+EGFR - Lipid panel  6. GAD (generalized anxiety disorder) Stress management Stopped xanax since on pain medication - escitalopram (LEXAPRO) 20 MG tablet; TAKE 1 TABLET (20 MG TOTAL) BY MOUTH DAILY.  Dispense: 30 tablet; Refill: 5  7. Fibromyalgia Exercise to keep muscles warm - HYDROcodone-acetaminophen (NORCO) 10-325 MG tablet; Take 1 tablet by mouth 3 (three) times daily.  Dispense: 90 tablet; Refill: 0 - HYDROcodone-acetaminophen (NORCO) 10-325 MG tablet; Take 1 tablet by mouth 3 (three) times daily.  Dispense: 90 tablet; Refill: 0 - HYDROcodone-acetaminophen (NORCO) 10-325 MG tablet; Take 1 tablet by mouth 3 (three) times daily.  Dispense: 90 tablet; Refill: 0 - ketorolac (TORADOL) injection 60 mg; Inject 2  mLs (60 mg total) into the muscle once.  8. BMI 26.0-26.9,adult Discussed diet and exercise for person with BMI >25 Will recheck weight in 3-6 months  9. HSV-2 infection - valACYclovir (VALTREX) 1000 MG tablet; Take 1 tablet (1,000 mg total) by mouth daily.  Dispense: 90 tablet; Refill: 0  10. Axillary mass, left - US BREAST COMPLETE UNI LEFT INC AXILLA; Future    Labs pending Health maintenance reviewed Diet and exercise encouraged Continue all meds Follow up  In 3 months   [place], FNP

## 2017-01-31 ENCOUNTER — Other Ambulatory Visit: Payer: Self-pay | Admitting: Nurse Practitioner

## 2017-02-05 ENCOUNTER — Telehealth: Payer: Self-pay | Admitting: Nurse Practitioner

## 2017-02-05 NOTE — Telephone Encounter (Signed)
PLEASE ADDRESS

## 2017-02-06 NOTE — Telephone Encounter (Signed)
Pt was unaware that the ultrasound would follow the diagnostic mammogram. She is aware now.  Orders for this exam were placed on 01/22/17 and this pt was scheduled on 01/22/17 for 02/11/17 at 8:40.  Films from [facility] were also requested on that day.

## 2017-02-06 NOTE — Telephone Encounter (Signed)
That makes me mad-  I am checking on it- should have already been scheduled- the order is in computer

## 2017-02-11 ENCOUNTER — Encounter (HOSPITAL_COMMUNITY): Payer: 59

## 2017-02-13 ENCOUNTER — Other Ambulatory Visit: Payer: Self-pay | Admitting: Nurse Practitioner

## 2017-02-13 DIAGNOSIS — F411 Generalized anxiety disorder: Secondary | ICD-10-CM

## 2017-02-18 ENCOUNTER — Encounter (HOSPITAL_COMMUNITY): Payer: 59

## 2017-03-04 ENCOUNTER — Inpatient Hospital Stay (HOSPITAL_COMMUNITY): Admission: RE | Admit: 2017-03-04 | Payer: 59 | Source: Ambulatory Visit

## 2017-03-08 ENCOUNTER — Other Ambulatory Visit: Payer: Self-pay | Admitting: Nurse Practitioner

## 2017-03-08 DIAGNOSIS — M797 Fibromyalgia: Secondary | ICD-10-CM

## 2017-03-11 ENCOUNTER — Telehealth: Payer: Self-pay | Admitting: Nurse Practitioner

## 2017-03-11 ENCOUNTER — Other Ambulatory Visit: Payer: Self-pay | Admitting: Nurse Practitioner

## 2017-03-11 DIAGNOSIS — N63 Unspecified lump in unspecified breast: Secondary | ICD-10-CM

## 2017-03-11 NOTE — Telephone Encounter (Signed)
[name] called stated patient has canceled diagnostic mammo 3 times.  They are wanting [place] to contact patient and tell her how in important it is for her to get. Please advise,

## 2017-03-11 NOTE — Telephone Encounter (Signed)
Yes please callpatient and find out why she is not having done

## 2017-03-11 NOTE — Telephone Encounter (Signed)
I would forward this to Shelah Lewandowsky to know.

## 2017-03-12 NOTE — Telephone Encounter (Signed)
Pt aware of appointment date/time of 03/17/17 at 12:50

## 2017-03-12 NOTE — Telephone Encounter (Signed)
Patient wants to go to [facility] for test.  She has not wanted to go to [facility] .  She cancelled one appointment because she was part of the [place] team and she was working when they told her about the other appointment.   Please schedule patient at [facility]

## 2017-03-17 ENCOUNTER — Other Ambulatory Visit: Payer: Self-pay | Admitting: Nurse Practitioner

## 2017-03-17 ENCOUNTER — Ambulatory Visit: Payer: 59

## 2017-03-17 ENCOUNTER — Ambulatory Visit
Admission: RE | Admit: 2017-03-17 | Discharge: 2017-03-17 | Disposition: A | Discharge status: Home / Self Care | Payer: 59 | Source: Ambulatory Visit | Attending: Nurse Practitioner | Admitting: Nurse Practitioner

## 2017-03-17 DIAGNOSIS — R2232 Localized swelling, mass and lump, left upper limb: Secondary | ICD-10-CM

## 2017-03-17 DIAGNOSIS — N6489 Other specified disorders of breast: Secondary | ICD-10-CM | POA: Diagnosis not present

## 2017-03-17 DIAGNOSIS — N63 Unspecified lump in unspecified breast: Secondary | ICD-10-CM

## 2017-03-17 DIAGNOSIS — R928 Other abnormal and inconclusive findings on diagnostic imaging of breast: Secondary | ICD-10-CM | POA: Diagnosis not present

## 2017-04-10 ENCOUNTER — Ambulatory Visit (INDEPENDENT_AMBULATORY_CARE_PROVIDER_SITE_OTHER): Payer: 59 | Admitting: Nurse Practitioner

## 2017-04-10 ENCOUNTER — Encounter: Payer: Self-pay | Admitting: Nurse Practitioner

## 2017-04-10 VITALS — BP 125/84 | HR 98 | Temp 98.1°F | Ht 68.0 in | Wt 185.0 lb

## 2017-04-10 DIAGNOSIS — M2669 Other specified disorders of temporomandibular joint: Secondary | ICD-10-CM | POA: Diagnosis not present

## 2017-04-10 DIAGNOSIS — Z6826 Body mass index (BMI) 26.0-26.9, adult: Secondary | ICD-10-CM

## 2017-04-10 DIAGNOSIS — F1120 Opioid dependence, uncomplicated: Secondary | ICD-10-CM | POA: Diagnosis not present

## 2017-04-10 DIAGNOSIS — K219 Gastro-esophageal reflux disease without esophagitis: Secondary | ICD-10-CM

## 2017-04-10 DIAGNOSIS — G43109 Migraine with aura, not intractable, without status migrainosus: Secondary | ICD-10-CM

## 2017-04-10 DIAGNOSIS — E7800 Pure hypercholesterolemia, unspecified: Secondary | ICD-10-CM | POA: Diagnosis not present

## 2017-04-10 DIAGNOSIS — S0300XS Dislocation of jaw, unspecified side, sequela: Secondary | ICD-10-CM

## 2017-04-10 DIAGNOSIS — F5101 Primary insomnia: Secondary | ICD-10-CM

## 2017-04-10 DIAGNOSIS — M797 Fibromyalgia: Secondary | ICD-10-CM | POA: Diagnosis not present

## 2017-04-10 DIAGNOSIS — F411 Generalized anxiety disorder: Secondary | ICD-10-CM | POA: Diagnosis not present

## 2017-04-10 DIAGNOSIS — B009 Herpesviral infection, unspecified: Secondary | ICD-10-CM

## 2017-04-10 MED ORDER — HYDROCODONE-ACETAMINOPHEN 10-325 MG PO TABS: 1.0000 | ORAL_TABLET | Freq: Three times a day (TID) | ORAL | 0 refills | Status: DC

## 2017-04-10 MED ORDER — ESCITALOPRAM OXALATE 20 MG PO TABS: 20.0000 mg | ORAL_TABLET | Freq: Every day | ORAL | 1 refills | Status: DC

## 2017-04-10 MED ORDER — SUMATRIPTAN SUCCINATE 100 MG PO TABS: ORAL_TABLET | ORAL | 2 refills | Status: DC

## 2017-04-10 MED ORDER — KETOROLAC TROMETHAMINE 60 MG/2ML IM SOLN
60.0000 mg | Freq: Once | INTRAMUSCULAR | Status: AC
  Administered 2017-04-10: 60 mg via INTRAMUSCULAR

## 2017-04-10 MED ORDER — PRAVASTATIN SODIUM 40 MG PO TABS: 40.0000 mg | ORAL_TABLET | Freq: Every day | ORAL | 1 refills | Status: DC

## 2017-04-10 MED ORDER — MILNACIPRAN HCL 50 MG PO TABS: 50.0000 mg | ORAL_TABLET | Freq: Two times a day (BID) | ORAL | 1 refills | Status: DC

## 2017-04-10 MED ORDER — VALACYCLOVIR HCL 1 G PO TABS: 1000.0000 mg | ORAL_TABLET | Freq: Every day | ORAL | 0 refills | Status: DC

## 2017-04-10 MED ORDER — PANTOPRAZOLE SODIUM 40 MG PO TBEC: 40.0000 mg | DELAYED_RELEASE_TABLET | Freq: Two times a day (BID) | ORAL | 1 refills | Status: DC

## 2017-04-10 NOTE — Patient Instructions (Signed)
Stress and Stress Management Stress is a normal reaction to life events. It is what you feel when life demands more than you are used to or more than you can handle. Some stress can be useful. For example, the stress reaction can help you catch the last bus of the day, study for a test, or meet a deadline at work. But stress that occurs too often or for too long can cause problems. It can affect your emotional health and interfere with relationships and normal daily activities. Too much stress can weaken your immune system and increase your risk for physical illness. If you already have a medical problem, stress can make it worse. What are the causes? All sorts of life events may cause stress. An event that causes stress for one person may not be stressful for another person. Major life events commonly cause stress. These may be positive or negative. Examples include losing your job, moving into a new home, getting married, having a baby, or losing a loved one. Less obvious life events may also cause stress, especially if they occur day after day or in combination. Examples include working long hours, driving in traffic, caring for children, being in debt, or being in a difficult relationship. What are the signs or symptoms? Stress may cause emotional symptoms including, the following:  Anxiety. This is feeling worried, afraid, on edge, overwhelmed, or out of control.  Anger. This is feeling irritated or impatient.  Depression. This is feeling sad, down, helpless, or guilty.  Difficulty focusing, remembering, or making decisions.  Stress may cause physical symptoms, including the following:  Aches and pains. These may affect your head, neck, back, stomach, or other areas of your body.  Tight muscles or clenched jaw.  Low energy or trouble sleeping.  Stress may cause unhealthy behaviors, including the following:  Eating to feel better (overeating) or skipping meals.  Sleeping too little,  too much, or both.  Working too much or putting off tasks (procrastination).  Smoking, drinking alcohol, or using drugs to feel better.  How is this diagnosed? Stress is diagnosed through an assessment by your health care provider. Your health care provider will ask questions about your symptoms and any stressful life events.Your health care provider will also ask about your medical history and may order blood tests or other tests. Certain medical conditions and medicine can cause physical symptoms similar to stress. Mental illness can cause emotional symptoms and unhealthy behaviors similar to stress. Your health care provider may refer you to a mental health professional for further evaluation. How is this treated? Stress management is the recommended treatment for stress.The goals of stress management are reducing stressful life events and coping with stress in healthy ways. Techniques for reducing stressful life events include the following:  Stress identification. Self-monitor for stress and identify what causes stress for you. These skills may help you to avoid some stressful events.  Time management. Set your priorities, keep a calendar of events, and learn to say "no." These tools can help you avoid making too many commitments.  Techniques for coping with stress include the following:  Rethinking the problem. Try to think realistically about stressful events rather than ignoring them or overreacting. Try to find the positives in a stressful situation rather than focusing on the negatives.  Exercise. Physical exercise can release both physical and emotional tension. The key is to find a form of exercise you enjoy and do it regularly.  Relaxation techniques. These relax the body and  mind. Examples include yoga, meditation, tai chi, biofeedback, deep breathing, progressive muscle relaxation, listening to music, being out in nature, journaling, and other hobbies. Again, the key is to find  one or more that you enjoy and can do regularly.  Healthy lifestyle. Eat a balanced diet, get plenty of sleep, and do not smoke. Avoid using alcohol or drugs to relax.  Strong support network. Spend time with family, friends, or other people you enjoy being around.Express your feelings and talk things over with someone you trust.  Counseling or talktherapy with a mental health professional may be helpful if you are having difficulty managing stress on your own. Medicine is typically not recommended for the treatment of stress.Talk to your health care provider if you think you need medicine for symptoms of stress. Follow these instructions at home:  Keep all follow-up visits as directed by your health care provider.  Take all medicines as directed by your health care provider. Contact a health care provider if:  Your symptoms get worse or you start having new symptoms.  You feel overwhelmed by your problems and can no longer manage them on your own. Get help right away if:  You feel like hurting yourself or someone else. This information is not intended to replace advice given to you by your health care provider. Make sure you discuss any questions you have with your health care provider. Document Released: 11/20/2000 Document Revised: 11/02/2015 Document Reviewed: 01/19/2013 Elsevier Interactive Patient Education  2017 Elsevier Inc.  

## 2017-04-10 NOTE — Progress Notes (Signed)
Subjective:    Patient ID: ZONIA CAPLIN, female    DOB: September 19, 1960, 56 y.o.   MRN: [id number]  HPI DARIANE NATZKE is here today for follow up of chronic medical problem.  Outpatient Encounter Prescriptions as of 04/10/2017  Medication Sig  . cyclobenzaprine (FLEXERIL) 10 MG tablet TAKE 1 TABLET (10 MG TOTAL) BY MOUTH 3 (THREE) TIMES DAILY AS NEEDED FOR MUSCLE SPASMS.  Marland Kitchen EPINEPHrine 0.3 mg/0.3 mL IJ SOAJ injection Inject 0.3 mLs (0.3 mg total) into the muscle once.  . escitalopram (LEXAPRO) 20 MG tablet TAKE 1 TABLET (20 MG TOTAL) BY MOUTH DAILY.  Marland Kitchen escitalopram (LEXAPRO) 20 MG tablet TAKE 1 TABLET BY MOUTH EVERY DAY  . HYDROcodone-acetaminophen (NORCO) 10-325 MG tablet Take 1 tablet by mouth 3 (three) times daily.  Marland Kitchen HYDROcodone-acetaminophen (NORCO) 10-325 MG tablet Take 1 tablet by mouth 3 (three) times daily.  Marland Kitchen HYDROcodone-acetaminophen (NORCO) 10-325 MG tablet Take 1 tablet by mouth 3 (three) times daily.  Marland Kitchen loratadine (CLARITIN) 10 MG tablet Take 10 mg by mouth daily.  . meclizine (ANTIVERT) 25 MG tablet Take 1 tablet (25 mg total) by mouth 3 (three) times daily as needed for dizziness.  . Multiple Vitamin (MULTIVITAMIN) tablet Take 1 tablet by mouth daily.  . ondansetron (ZOFRAN) 4 MG tablet Take 1 tablet (4 mg total) by mouth every 8 (eight) hours as needed for nausea or vomiting.  . pantoprazole (PROTONIX) 40 MG tablet TAKE 1 TABLET (40 MG TOTAL) BY MOUTH 2 (TWO) TIMES DAILY.  . pravastatin (PRAVACHOL) 40 MG tablet Take 1 tablet (40 mg total) by mouth daily.  Marland Kitchen SAVELLA 50 MG TABS tablet TAKE 1 TABLET BY MOUTH TWICE A DAY  . SUMAtriptan (IMITREX) 100 MG tablet TAKE 1 TABLET EVERY 2 HOURS AS NEEDED FOR MIGRAINES  . valACYclovir (VALTREX) 1000 MG tablet Take 1 tablet (1,000 mg total) by mouth daily.   No facility-administered encounter medications on file as of 04/10/2017.     1. Gastroesophageal reflux disease, esophagitis presence not specified  Symptoms managed with  pantoprazole.  2. Migraine with aura and without status migrainosus, not intractable  Migraines controlled with Imitrex.  Increased occurrence of migraines in the last few weeks, but this is contributed to increased stress.  3. Dislocation of temporomandibular joint, sequela  No current issues with this.  4. Pure hypercholesterolemia Managed with pravastatin.  LFTs monitored regularly.   5. Fibromyalgia  Pain managed with hydrocodone-APAP 12-325, Savella, and flexeril.    Pain mgmt contract established.  She states it seems to be getting worse.  She is increasing exercise, trying to lose weight, eating better and does not seem to be feeling any better.  6. GAD (generalized anxiety disorder)  Symptoms under control with Lexapro daily.  No current concerns.  7. Primary insomnia  Not currently taking medication for this.    8. Uncomplicated opioid dependence ([place])  Patient has active pain contract.  9. BMI 26.0-26.9,adult  No significant weight gain or loss.    New complaints: None today.  Social history: Lost her uncle 2 weeks ago and territory expanded at work.  1 year anniversary of husband's death approaching.   Review of Systems  Constitutional: Positive for activity change (increased exercise) and appetite change (watching diet).  Respiratory: Negative for cough and shortness of breath.   Cardiovascular: Negative for chest pain and palpitations.  Musculoskeletal: Positive for arthralgias (fibromyalgia worse recently).  Neurological: Positive for headaches (increased incidences). Negative for dizziness and light-headedness.  All other  systems reviewed and are negative.      Objective:   Physical Exam  Constitutional: She is oriented to person, place, and time. She appears well-developed and well-nourished. No distress.  HENT:  Head: Normocephalic.  Right Ear: External ear normal.  Left Ear: External ear normal.  Mouth/Throat: Oropharynx is clear and moist.  Eyes:  Pupils are equal, round, and reactive to light.  Neck: Normal range of motion. Neck supple. No thyromegaly present.  Cardiovascular: Normal rate, regular rhythm, normal heart sounds and intact distal pulses.   No murmur heard. Pulmonary/Chest: Effort normal and breath sounds normal. No respiratory distress. She has no wheezes.  Abdominal: Soft. Bowel sounds are normal. She exhibits no distension. There is no tenderness.  Musculoskeletal: Normal range of motion. She exhibits no edema.  Lymphadenopathy:    She has no cervical adenopathy.  Neurological: She is alert and oriented to person, place, and time.  Skin: Skin is warm and dry.  Psychiatric: She has a normal mood and affect. Her behavior is normal.   BP 125/84   Pulse 98   Temp 98.1 F (36.7 C) (Oral)   Ht _0  (1.727 m)   Wt 185 lb (83.9 kg)   BMI 28.13 kg/m     Assessment & Plan:  1. Gastroesophageal reflux disease, esophagitis presence not specified Avoid spicy foods Do not eat 2 hours prior to bedtime - pantoprazole (PROTONIX) 40 MG tablet; Take 1 tablet (40 mg total) by mouth 2 (two) times daily.  Dispense: 180 tablet; Refill: 1  2. Migraine with aura and without status migrainosus, not intractable Avoid caffeine - SUMAtriptan (IMITREX) 100 MG tablet; TAKE 1 TABLET EVERY 2 HOURS AS NEEDED FOR MIGRAINES  Dispense: 12 tablet; Refill: 2  3. Dislocation of temporomandibular joint, sequela better  4. Pure hypercholesterolemia Low fat diet - pravastatin (PRAVACHOL) 40 MG tablet; Take 1 tablet (40 mg total) by mouth daily.  Dispense: 90 tablet; Refill: 1 CMP14+EGFR - Lipid panel   5. Fibromyalgia encourgae exercises - Milnacipran (SAVELLA) 50 MG TABS tablet; Take 1 tablet (50 mg total) by mouth 2 (two) times daily.  Dispense: 180 tablet; Refill: 1 - ketorolac (TORADOL) injection 60 mg; Inject 2 mLs (60 mg total) into the muscle once. - HYDROcodone-acetaminophen (NORCO) 10-325 MG tablet; Take 1 tablet by mouth 3 (three)  times daily.  Dispense: 90 tablet; Refill: 0 - HYDROcodone-acetaminophen (NORCO) 10-325 MG tablet; Take 1 tablet by mouth 3 (three) times daily.  Dispense: 90 tablet; Refill: 0 - HYDROcodone-acetaminophen (NORCO) 10-325 MG tablet; Take 1 tablet by mouth 3 (three) times daily.  Dispense: 90 tablet; Refill: 0  6. GAD (generalized anxiety disorder) Stress management - escitalopram (LEXAPRO) 20 MG tablet; Take 1 tablet (20 mg total) by mouth daily.  Dispense: 90 tablet; Refill: 1  7. Primary insomnia Bedtime routine  8. Uncomplicated opioid dependence ([place])  9. BMI 26.0-26.9,adult Discussed diet and exercise for person with BMI >25 Will recheck weight in 3-6 months  10. HSV-2 infection - valACYclovir (VALTREX) 1000 MG tablet; Take 1 tablet (1,000 mg total) by mouth daily.  Dispense: 90 tablet; Refill: 0    Labs pending Health maintenance reviewed Diet and exercise encouraged Continue all meds Follow up  In 3 months   [place], FNP

## 2017-05-03 ENCOUNTER — Ambulatory Visit: Payer: 59 | Admitting: Pediatrics

## 2017-05-03 ENCOUNTER — Encounter: Payer: Self-pay | Admitting: Pediatrics

## 2017-05-03 VITALS — BP 127/89 | HR 97 | Temp 97.2°F | Ht 68.0 in | Wt 183.6 lb

## 2017-05-03 DIAGNOSIS — J069 Acute upper respiratory infection, unspecified: Secondary | ICD-10-CM | POA: Diagnosis not present

## 2017-05-03 DIAGNOSIS — J029 Acute pharyngitis, unspecified: Secondary | ICD-10-CM | POA: Diagnosis not present

## 2017-05-03 NOTE — Progress Notes (Signed)
  Subjective:   Patient ID: [name], female    DOB: [birth year], 58 y.o.   MRN: [id number] CC: Sore Throat and Ear Pain  HPI: [name] is a 56 y.o. female presenting for Sore Throat and Ear Pain  Started two days ago Sore throat bothering her the most Feeling feverish at times, sweating at home off and on Appetite is down, pt says it is always down, she doesn't have much interest in food when she feels well which is not new Still eating regularly L ear feels stopped up, has some pain  Daughter recently diagnosed with strep throat  Relevant past medical, surgical, family and social history reviewed. Allergies and medications reviewed and updated. Social History   Tobacco Use  Smoking Status Former Smoker  . Packs/day: 0.50  . Years: 30.00  . Pack years: 15.00  Smokeless Tobacco Never Used   ROS: Per HPI   Objective:    BP 127/89   Pulse 97   Temp (!) 97.2 F (36.2 C) (Oral)   Ht 5\' 8"  (1.727 m)   Wt 183 lb 9.6 oz (83.3 kg)   BMI 27.92 kg/m   Wt Readings from Last 3 Encounters:  05/03/17 183 lb 9.6 oz (83.3 kg)  04/10/17 185 lb (83.9 kg)  01/22/17 183 lb (83 kg)    Gen: NAD, alert, cooperative with exam, NCAT EYES: EOMI, no conjunctival injection, or no icterus ENT:  L TM retracted, no effusion, no redness, R TM nl, OP with erythema LYMPH: small < 1cm b/l cervical LAD CV: NRRR, normal S1/S2, no murmur, distal pulses 2+ b/l Resp: CTABL, no wheezes, normal WOB Abd: +BS, soft, NTND. no guarding or organomegaly Ext: No edema, warm Neuro: Alert and oriented, strength equal b/l UE and LE, coordination grossly normal MSK: normal muscle bulk  Assessment & Plan:  [name] was seen today for sore throat and ear pain.  Diagnoses and all orders for this visit:  Acute URI Strep neg, discussed symptom care and return precautions Will f/u culture  Sore throat -     Rapid Strep Screen (Not at [facility]) -     Culture, Group A Strep   Follow up plan: As  needed Assunta Found, MD [place]

## 2017-05-03 NOTE — Patient Instructions (Addendum)
Nasal congestion: Netipot with distilled water 2-3 times a day to clear out sinuses Or Normal saline nasal spray Flonase steroid nasal spray  Antihistamine daily  For sore throat can use: Ibuprofen 400- 600mg  three times a day Take tylenol every 6 hours Throat lozenges chloroseptic spray  Stick with bland foods Drink lots of fluids

## 2017-05-05 LAB — RAPID STREP SCREEN (MED CTR MEBANE ONLY): STREP GP A AG, IA W/REFLEX: NEGATIVE

## 2017-05-05 LAB — CULTURE, GROUP A STREP: STREP A CULTURE: NEGATIVE

## 2017-06-17 ENCOUNTER — Ambulatory Visit: Payer: 59 | Admitting: Nurse Practitioner

## 2017-06-17 ENCOUNTER — Encounter: Payer: Self-pay | Admitting: Nurse Practitioner

## 2017-06-17 VITALS — BP 137/84 | HR 102 | Temp 97.3°F | Ht 68.0 in | Wt 185.0 lb

## 2017-06-17 DIAGNOSIS — L989 Disorder of the skin and subcutaneous tissue, unspecified: Secondary | ICD-10-CM | POA: Diagnosis not present

## 2017-06-17 DIAGNOSIS — D485 Neoplasm of uncertain behavior of skin: Secondary | ICD-10-CM | POA: Diagnosis not present

## 2017-06-17 NOTE — Patient Instructions (Signed)
Cryotherapy  WHAT IS CRYOTHERAPY?  Cryotherapy, or cold therapy, is a treatment that uses cold temperatures to treat an injury or medical condition. It includes using cold packs or ice packs to reduce pain and swelling. Only use cryotherapy if your doctor says it is okay.  HOW DO I USE CRYOTHERAPY?   Place a towel between the cold source and your skin.   Apply the cold source for no more than 20 minutes at a time.   Check your skin after 5 minutes to make sure there are no signs of a poor response to cold or skin damage. Check for:  ? White spots on your skin. Your skin may look blotchy or mottled.  ? Skin that looks blue or pale.  ? Skin that feels waxy or hard.   Repeat these steps as many times each day as told by your doctor.    HOW CAN I MAKE A COLD PACK?  When using a cold pack at home to reduce pain and swelling, you can use:   A silica gel cold pack that has been left in the freezer. You can buy this online or in stores.   A plastic bag of frozen vegetables.   A sealable plastic bag that has been filled with crushed ice.    Always wrap the pack in a dry or damp towel to avoid direct contact with your skin.  WHEN SHOULD I CALL MY DOCTOR?  Call your doctor if:   You start to have white spots on your skin. This may give your skin a blotchy or mottled look.   Your skin turns blue or pale.   Your skin becomes waxy or hard.   Your swelling gets worse.    This information is not intended to replace advice given to you by your health care provider. Make sure you discuss any questions you have with your health care provider.  Document Released: 11/13/2007 Document Revised: 11/02/2015 Document Reviewed: 02/08/2015  Elsevier Interactive Patient Education  2017 Elsevier Inc.

## 2017-06-17 NOTE — Progress Notes (Signed)
   Subjective:    Patient ID: [name], female    DOB: 07-13-1960, 57 y.o.   MRN: [id number]  HPI Patient come sin wanting some skin lesions either removed or frozen. One is on her left upper but check and one on left anterior shoulder.    Review of Systems  Constitutional: Negative.   Respiratory: Negative.   Cardiovascular: Negative.   Neurological: Negative.   Psychiatric/Behavioral: Negative.   All other systems reviewed and are negative.      Objective:   Physical Exam  Constitutional: She appears well-developed and well-nourished. No distress.  Cardiovascular: Normal rate and regular rhythm.  Pulmonary/Chest: Effort normal and breath sounds normal.  Skin: Skin is warm and dry.  2cm scaley brownish lesion on upper left buttocks 1cm flesh colored slightly raised lesion on left anterior shoulder  Psychiatric: She has a normal mood and affect. Her behavior is normal. Judgment and thought content normal.    BP 137/84   Pulse (!) 102   Temp (!) 97.3 F (36.3 C) (Oral)   Ht 5\' 8"  (1.727 m)   Wt 185 lb (83.9 kg)   BMI 28.13 kg/m   Cryotherapy of skin lesions    Assessment & Plan:   1. Skin lesions    Watch for signs of infection RTO prn  Mary-Margaret Hassell Done, FNP

## 2017-08-27 ENCOUNTER — Other Ambulatory Visit: Payer: Self-pay | Admitting: Nurse Practitioner

## 2017-08-27 DIAGNOSIS — B009 Herpesviral infection, unspecified: Secondary | ICD-10-CM

## 2017-09-05 ENCOUNTER — Other Ambulatory Visit: Payer: Self-pay | Admitting: Nurse Practitioner

## 2017-09-05 NOTE — Telephone Encounter (Signed)
Last seen 06/17/17  MMM 

## 2017-09-29 ENCOUNTER — Telehealth: Payer: Self-pay

## 2017-09-29 DIAGNOSIS — H9193 Unspecified hearing loss, bilateral: Secondary | ICD-10-CM

## 2017-09-29 NOTE — Telephone Encounter (Signed)
Needs referral for Audiologist  Having trouble with hearing

## 2017-09-30 ENCOUNTER — Telehealth: Payer: Self-pay | Admitting: Nurse Practitioner

## 2017-09-30 NOTE — Addendum Note (Signed)
Addended by: Chevis Pretty on: 09/30/2017 08:16 AM   Modules accepted: Orders

## 2017-09-30 NOTE — Telephone Encounter (Signed)
[name] please check on thisti

## 2017-10-01 NOTE — Telephone Encounter (Signed)
Called patient and scheduled appt per her request

## 2017-10-02 ENCOUNTER — Encounter: Payer: Self-pay | Admitting: Nurse Practitioner

## 2017-10-02 ENCOUNTER — Ambulatory Visit: Payer: 59 | Admitting: Nurse Practitioner

## 2017-10-02 VITALS — BP 122/85 | HR 94 | Temp 97.3°F | Ht 68.0 in | Wt 183.0 lb

## 2017-10-02 DIAGNOSIS — E7800 Pure hypercholesterolemia, unspecified: Secondary | ICD-10-CM

## 2017-10-02 DIAGNOSIS — M797 Fibromyalgia: Secondary | ICD-10-CM | POA: Diagnosis not present

## 2017-10-02 LAB — CMP14+EGFR
A/G RATIO: 1.8 (ref 1.2–2.2)
ALBUMIN: 4.5 g/dL (ref 3.5–5.5)
ALT: 44 IU/L — ABNORMAL HIGH (ref 0–32)
AST: 44 IU/L — ABNORMAL HIGH (ref 0–40)
Alkaline Phosphatase: 105 IU/L (ref 39–117)
BILIRUBIN TOTAL: 0.2 mg/dL (ref 0.0–1.2)
BUN / CREAT RATIO: 18 (ref 9–23)
BUN: 14 mg/dL (ref 6–24)
CHLORIDE: 104 mmol/L (ref 96–106)
CO2: 24 mmol/L (ref 20–29)
Calcium: 9.9 mg/dL (ref 8.7–10.2)
Creatinine, Ser: 0.79 mg/dL (ref 0.57–1.00)
GFR calc Af Amer: 95 mL/min/{1.73_m2} (ref >59)
GFR calc non Af Amer: 82 mL/min/{1.73_m2} (ref >59)
GLOBULIN, TOTAL: 2.5 g/dL (ref 1.5–4.5)
Glucose: 107 mg/dL — ABNORMAL HIGH (ref 65–99)
POTASSIUM: 4.5 mmol/L (ref 3.5–5.2)
Sodium: 142 mmol/L (ref 134–144)
Total Protein: 7 g/dL (ref 6.0–8.5)

## 2017-10-02 LAB — LIPID PANEL
Chol/HDL Ratio: 2.8 ratio (ref 0.0–4.4)
Cholesterol, Total: 120 mg/dL (ref 100–199)
HDL: 43 mg/dL (ref >39)
LDL Calculated: 58 mg/dL (ref 0–99)
Triglycerides: 96 mg/dL (ref 0–149)
VLDL Cholesterol Cal: 19 mg/dL (ref 5–40)

## 2017-10-02 MED ORDER — HYDROCODONE-ACETAMINOPHEN 10-325 MG PO TABS: 1.0000 | ORAL_TABLET | Freq: Three times a day (TID) | ORAL | 0 refills | Status: DC

## 2017-10-02 MED ORDER — CYCLOBENZAPRINE HCL 10 MG PO TABS: ORAL_TABLET | ORAL | 1 refills | Status: DC

## 2017-10-02 NOTE — Progress Notes (Signed)
Subjective:    Patient ID: [name], female    DOB: 1961/05/15, 57 y.o.   MRN: [id number]  HPI Patient in today for:  - pain management Pain assessment: Cause of pain- fibromyalgia and arthritis of handsPain location- al over Pain on scale of 1-10- 2-8/10 some days worse then others Frequency- daily What increases pain-nothing really makes it worse What makes pain Better-pain meds help and exercise helps some. Effects on ADL - pushes through and does what she needs to do Any change in general medical condition-none  Current medications- norco 10/325 TID Effectiveness of current meds-helps most days Adverse reactions form pain meds-none Morphine equivalent-30  Pill count performed-No Urine drug screen- No Was the [place] reviewed- yes  If yes were their any concerning findings? - none  Pain contract signed on: 08/23/15  - tinnitis is worsening and she wants to be referred to audiologist- referral made on 09/30/17 but I appointment made yet. - patient did not go and have labs drawn last 2 follow up isits- so will have patient have done today.  Review of Systems  Constitutional: Negative for activity change and appetite change.  HENT: Negative.   Eyes: Negative for pain.  Respiratory: Negative for shortness of breath.   Cardiovascular: Negative for chest pain, palpitations and leg swelling.  Gastrointestinal: Negative for abdominal pain.  Endocrine: Negative for polydipsia.  Genitourinary: Negative.   Skin: Negative for rash.  Neurological: Negative for dizziness, weakness and headaches.  Hematological: Does not bruise/bleed easily.  Psychiatric/Behavioral: Negative.   All other systems reviewed and are negative.      Objective:   Physical Exam  Constitutional: She is oriented to person, place, and time. She appears well-developed and well-nourished.  HENT:  Nose: Nose normal.  Mouth/Throat: Oropharynx is clear and moist.  Eyes: EOM are normal.  Neck: Trachea  normal, normal range of motion and full passive range of motion without pain. Neck supple. No JVD present. Carotid bruit is not present. No thyromegaly present.  Cardiovascular: Normal rate, regular rhythm, normal heart sounds and intact distal pulses. Exam reveals no gallop and no friction rub.  No murmur heard. Pulmonary/Chest: Effort normal and breath sounds normal.  Abdominal: Soft. Bowel sounds are normal. She exhibits no distension and no mass. There is no tenderness.  Musculoskeletal: Normal range of motion.  Lymphadenopathy:    She has no cervical adenopathy.  Neurological: She is alert and oriented to person, place, and time. She has normal reflexes.  Skin: Skin is warm and dry.  Psychiatric: She has a normal mood and affect. Her behavior is normal. Judgment and thought content normal.   BP 122/85   Pulse 94   Temp (!) 97.3 F (36.3 C) (Oral)   Ht _0  (1.727 m)   Wt 183 lb (83 kg)   BMI 27.83 kg/m        Assessment & Plan:  1. Fibromyalgia Continue to exercise daily to keep muscles warm - HYDROcodone-acetaminophen (NORCO) 10-325 MG tablet; Take 1 tablet by mouth 3 (three) times daily.  Dispense: 90 tablet; Refill: 0 - HYDROcodone-acetaminophen (NORCO) 10-325 MG tablet; Take 1 tablet by mouth 3 (three) times daily.  Dispense: 90 tablet; Refill: 0 - HYDROcodone-acetaminophen (NORCO) 10-325 MG tablet; Take 1 tablet by mouth 3 (three) times daily.  Dispense: 90 tablet; Refill: 0  2. Pure hypercholesterolemia Low fat diet - CMP14+EGFR - Lipid panel    Labs pending Health maintenance reviewed Diet and exercise encouraged Continue all meds Follow up  In 6 months   [place], FNP

## 2017-10-02 NOTE — Addendum Note (Signed)
Addended by: Chevis Pretty on: 10/02/2017 10:08 AM   Modules accepted: Orders

## 2017-10-15 ENCOUNTER — Other Ambulatory Visit: Payer: Self-pay | Admitting: Nurse Practitioner

## 2017-10-15 DIAGNOSIS — G43109 Migraine with aura, not intractable, without status migrainosus: Secondary | ICD-10-CM

## 2017-12-25 ENCOUNTER — Other Ambulatory Visit: Payer: Self-pay | Admitting: Nurse Practitioner

## 2017-12-25 DIAGNOSIS — M797 Fibromyalgia: Secondary | ICD-10-CM

## 2018-01-08 ENCOUNTER — Ambulatory Visit: Payer: 59 | Admitting: Nurse Practitioner

## 2018-01-08 ENCOUNTER — Encounter: Payer: Self-pay | Admitting: Nurse Practitioner

## 2018-01-08 VITALS — BP 140/85 | HR 95 | Temp 97.4°F | Ht 68.0 in | Wt 184.0 lb

## 2018-01-08 DIAGNOSIS — M797 Fibromyalgia: Secondary | ICD-10-CM | POA: Diagnosis not present

## 2018-01-08 DIAGNOSIS — M26659 Arthropathy of unspecified temporomandibular joint: Secondary | ICD-10-CM

## 2018-01-08 DIAGNOSIS — M26609 Unspecified temporomandibular joint disorder, unspecified side: Secondary | ICD-10-CM

## 2018-01-08 DIAGNOSIS — R42 Dizziness and giddiness: Secondary | ICD-10-CM | POA: Diagnosis not present

## 2018-01-08 MED ORDER — CYCLOBENZAPRINE HCL 10 MG PO TABS
ORAL_TABLET | ORAL | 1 refills | Status: DC
Start: 2018-01-08 — End: 2020-12-01

## 2018-01-08 MED ORDER — MECLIZINE HCL 25 MG PO TABS: 25.0000 mg | ORAL_TABLET | Freq: Three times a day (TID) | ORAL | 0 refills | Status: DC | PRN

## 2018-01-08 MED ORDER — METHYLPREDNISOLONE ACETATE 80 MG/ML IJ SUSP
80.0000 mg | Freq: Once | INTRAMUSCULAR | Status: AC
Start: 2018-01-08 — End: 2018-01-08
  Administered 2018-01-08: 80 mg via INTRAMUSCULAR

## 2018-01-08 MED ORDER — HYDROCODONE-ACETAMINOPHEN 10-325 MG PO TABS: 1.0000 | ORAL_TABLET | Freq: Three times a day (TID) | ORAL | 0 refills | Status: DC | PRN

## 2018-01-08 NOTE — Progress Notes (Signed)
   Subjective:    Patient ID: [name], female    DOB: December 14, 1960, 57 y.o.   MRN: [id number]   Chief Complaint: Ear Pain (right ear    Wants hydrocodone refilled)   HPI - patient comes in c/o right ear pain. Started about 6 months ago- had some oral surgery 6 months ago that is when it started hurting. - pain med refills- would like to have steroid shot which always help with pain. Pain assessment: Cause of pain- fibromyalgia Pain location- all over- moves from one area to another Pain on scale of 1-10- 7/10 currenlty Frequency- daily What increases pain-stress What makes pain Better-heat helps Effects on ADL - does what she needs to do Any change in general medical condition-none  Current medications- norco 10/325 TID Effectiveness of current meds-helps but never really relieves all of pain Adverse reactions form pain meds-none Morphine equivalent 30  Pill count performed-No Urine drug screen- No Was the [place] reviewed- yes  If yes were their any concerning findings? - none  Pain contract signed on: 08/13/15    Review of Systems  Constitutional: Negative for activity change and appetite change.  HENT: Negative.   Eyes: Negative for pain.  Respiratory: Negative for shortness of breath.   Cardiovascular: Negative for chest pain, palpitations and leg swelling.  Gastrointestinal: Negative for abdominal pain.  Endocrine: Negative for polydipsia.  Genitourinary: Negative.   Musculoskeletal: Positive for arthralgias and myalgias.  Skin: Negative for rash.  Neurological: Negative for dizziness, weakness and headaches.  Hematological: Does not bruise/bleed easily.  Psychiatric/Behavioral: Negative.   All other systems reviewed and are negative.      Objective:   Physical Exam  Constitutional: She is oriented to person, place, and time. She appears well-developed and well-nourished. No distress.  Cardiovascular: Normal rate and regular rhythm.  Pulmonary/Chest:  Effort normal and breath sounds normal.  Abdominal: Soft.  Neurological: She is alert and oriented to person, place, and time.  Skin: Skin is warm.  Psychiatric: She has a normal mood and affect. Her behavior is normal. Thought content normal.    BP 140/85   Pulse 95   Temp (!) 97.4 F (36.3 C) (Oral)   Ht 5\' 8"  (1.727 m)   Wt 184 lb (83.5 kg)   BMI 27.98 kg/m       Assessment & Plan:  [name] comes in today with chief complaint of Ear Pain (right ear    Wants hydrocodone refilled)   Diagnosis and orders addressed:  1. Dizziness Force fluids - meclizine (ANTIVERT) 25 MG tablet; Take 1 tablet (25 mg total) by mouth 3 (three) times daily as needed for dizziness.  Dispense: 30 tablet; Refill: 0  2. Fibromyalgia Heat as needed - HYDROcodone-acetaminophen (NORCO) 10-325 MG tablet; Take 1 tablet by mouth every 8 (eight) hours as needed.  Dispense: 30 tablet; Refill: 0 - HYDROcodone-acetaminophen (NORCO) 10-325 MG tablet; Take 1 tablet by mouth every 8 (eight) hours as needed.  Dispense: 90 tablet; Refill: 0 - HYDROcodone-acetaminophen (NORCO) 10-325 MG tablet; Take 1 tablet by mouth every 8 (eight) hours as needed.  Dispense: 90 tablet; Refill: 0 - methylPREDNISolone acetate (DEPO-MEDROL) injection 80 mg  3. TMJ arthropathy Wear mouth guard- see denetist if does not inprove - cyclobenzaprine (FLEXERIL) 10 MG tablet; 1 po TID  Dispense: 30 tablet; Refill: 1   Labs pending Health Maintenance reviewed Diet and exercise encouraged  Follow up plan: 3 months   Mary-Margaret Hassell Done, FNP

## 2018-01-08 NOTE — Patient Instructions (Signed)
Temporomandibular Joint Syndrome Temporomandibular joint (TMJ) syndrome is a condition that affects the joints between your jaw and your skull. The TMJs are located near your ears and allow your jaw to open and close. These joints and the nearby muscles are involved in all movements of the jaw. People with TMJ syndrome have pain in the area of these joints and muscles. Chewing, biting, or other movements of the jaw can be difficult or painful. TMJ syndrome can be caused by various things. In many cases, the condition is mild and goes away within a few weeks. For some people, the condition can become a long-term problem. What are the causes? Possible causes of TMJ syndrome include:  Grinding your teeth or clenching your jaw. Some people do this when they are under stress.  Arthritis.  Injury to the jaw.  Head or neck injury.  Teeth or dentures that are not aligned well.  In some cases, the cause of TMJ syndrome may not be known. What are the signs or symptoms? The most common symptom is an aching pain on the side of the head in the area of the TMJ. Other symptoms may include:  Pain when moving your jaw, such as when chewing or biting.  Being unable to open your jaw all the way.  Making a clicking sound when you open your mouth.  Headache.  Earache.  Neck or shoulder pain.  How is this diagnosed? Diagnosis can usually be made based on your symptoms, your medical history, and a physical exam. Your health care provider may check the range of motion of your jaw. Imaging tests, such as X-rays or an MRI, are sometimes done. You may need to see your dentist to determine if your teeth and jaw are lined up correctly. How is this treated? TMJ syndrome often goes away on its own. If treatment is needed, the options may include:  Eating soft foods and applying ice or heat.  Medicines to relieve pain or inflammation.  Medicines to relax the muscles.  A splint, bite plate, or mouthpiece  to prevent teeth grinding or jaw clenching.  Relaxation techniques or counseling to help reduce stress.  Transcutaneous electrical nerve stimulation (TENS). This helps to relieve pain by applying an electrical current through the skin.  Acupuncture. This is sometimes helpful to relieve pain.  Jaw surgery. This is rarely needed.  Follow these instructions at home:  Take medicines only as directed by your health care provider.  Eat a soft diet if you are having trouble chewing.  Apply ice to the painful area. ? Put ice in a plastic bag. ? Place a towel between your skin and the bag. ? Leave the ice on for 20 minutes, 2-3 times a day.  Apply a warm compress to the painful area as directed.  Massage your jaw area and perform any jaw stretching exercises as recommended by your health care provider.  If you were given a mouthpiece or bite plate, wear it as directed.  Avoid foods that require a lot of chewing. Do not chew gum.  Keep all follow-up visits as directed by your health care provider. This is important. Contact a health care provider if:  You are having trouble eating.  You have new or worsening symptoms. Get help right away if:  Your jaw locks open or closed. This information is not intended to replace advice given to you by your health care provider. Make sure you discuss any questions you have with your health care provider. Document   Released: 02/19/2001 Document Revised: 01/25/2016 Document Reviewed: 12/30/2013 Elsevier Interactive Patient Education  2018 [employer]vier Inc[employer]  

## 2018-01-22 ENCOUNTER — Other Ambulatory Visit: Payer: Self-pay | Admitting: Nurse Practitioner

## 2018-01-22 MED ORDER — FUROSEMIDE 20 MG PO TABS: 20.0000 mg | ORAL_TABLET | Freq: Every day | ORAL | 11 refills | Status: DC

## 2018-02-16 ENCOUNTER — Other Ambulatory Visit: Payer: Self-pay | Admitting: Nurse Practitioner

## 2018-02-16 DIAGNOSIS — B009 Herpesviral infection, unspecified: Secondary | ICD-10-CM

## 2018-03-02 ENCOUNTER — Other Ambulatory Visit: Payer: Self-pay | Admitting: Nurse Practitioner

## 2018-03-02 DIAGNOSIS — K219 Gastro-esophageal reflux disease without esophagitis: Secondary | ICD-10-CM

## 2018-03-23 DIAGNOSIS — M9901 Segmental and somatic dysfunction of cervical region: Secondary | ICD-10-CM | POA: Diagnosis not present

## 2018-03-23 DIAGNOSIS — M50323 Other cervical disc degeneration at C6-C7 level: Secondary | ICD-10-CM | POA: Diagnosis not present

## 2018-03-24 DIAGNOSIS — M9901 Segmental and somatic dysfunction of cervical region: Secondary | ICD-10-CM | POA: Diagnosis not present

## 2018-03-24 DIAGNOSIS — M50323 Other cervical disc degeneration at C6-C7 level: Secondary | ICD-10-CM | POA: Diagnosis not present

## 2018-03-26 DIAGNOSIS — M9901 Segmental and somatic dysfunction of cervical region: Secondary | ICD-10-CM | POA: Diagnosis not present

## 2018-03-26 DIAGNOSIS — M50323 Other cervical disc degeneration at C6-C7 level: Secondary | ICD-10-CM | POA: Diagnosis not present

## 2018-03-31 DIAGNOSIS — M50323 Other cervical disc degeneration at C6-C7 level: Secondary | ICD-10-CM | POA: Diagnosis not present

## 2018-03-31 DIAGNOSIS — M9901 Segmental and somatic dysfunction of cervical region: Secondary | ICD-10-CM | POA: Diagnosis not present

## 2018-03-31 DIAGNOSIS — M25512 Pain in left shoulder: Secondary | ICD-10-CM | POA: Diagnosis not present

## 2018-04-06 DIAGNOSIS — M9901 Segmental and somatic dysfunction of cervical region: Secondary | ICD-10-CM | POA: Diagnosis not present

## 2018-04-06 DIAGNOSIS — M25512 Pain in left shoulder: Secondary | ICD-10-CM | POA: Diagnosis not present

## 2018-04-06 DIAGNOSIS — M50323 Other cervical disc degeneration at C6-C7 level: Secondary | ICD-10-CM | POA: Diagnosis not present

## 2018-04-08 DIAGNOSIS — M9901 Segmental and somatic dysfunction of cervical region: Secondary | ICD-10-CM | POA: Diagnosis not present

## 2018-04-08 DIAGNOSIS — M25512 Pain in left shoulder: Secondary | ICD-10-CM | POA: Diagnosis not present

## 2018-04-08 DIAGNOSIS — M50323 Other cervical disc degeneration at C6-C7 level: Secondary | ICD-10-CM | POA: Diagnosis not present

## 2018-04-20 ENCOUNTER — Ambulatory Visit: Payer: 59 | Admitting: Nurse Practitioner

## 2018-04-20 ENCOUNTER — Encounter: Payer: Self-pay | Admitting: Nurse Practitioner

## 2018-04-20 VITALS — BP 123/77 | HR 95 | Temp 97.9°F | Ht 68.0 in | Wt 182.0 lb

## 2018-04-20 DIAGNOSIS — M797 Fibromyalgia: Secondary | ICD-10-CM

## 2018-04-20 DIAGNOSIS — K219 Gastro-esophageal reflux disease without esophagitis: Secondary | ICD-10-CM

## 2018-04-20 DIAGNOSIS — M26659 Arthropathy of unspecified temporomandibular joint: Secondary | ICD-10-CM

## 2018-04-20 DIAGNOSIS — G43109 Migraine with aura, not intractable, without status migrainosus: Secondary | ICD-10-CM | POA: Diagnosis not present

## 2018-04-20 DIAGNOSIS — F411 Generalized anxiety disorder: Secondary | ICD-10-CM

## 2018-04-20 DIAGNOSIS — Z6826 Body mass index (BMI) 26.0-26.9, adult: Secondary | ICD-10-CM

## 2018-04-20 DIAGNOSIS — F5101 Primary insomnia: Secondary | ICD-10-CM

## 2018-04-20 DIAGNOSIS — M26609 Unspecified temporomandibular joint disorder, unspecified side: Secondary | ICD-10-CM | POA: Diagnosis not present

## 2018-04-20 DIAGNOSIS — B009 Herpesviral infection, unspecified: Secondary | ICD-10-CM

## 2018-04-20 DIAGNOSIS — F1120 Opioid dependence, uncomplicated: Secondary | ICD-10-CM

## 2018-04-20 DIAGNOSIS — E7800 Pure hypercholesterolemia, unspecified: Secondary | ICD-10-CM

## 2018-04-20 MED ORDER — HYDROCODONE-ACETAMINOPHEN 10-325 MG PO TABS
1.0000 | ORAL_TABLET | Freq: Three times a day (TID) | ORAL | 0 refills | Status: DC
Start: 2018-06-19 — End: 2018-07-03

## 2018-04-20 MED ORDER — VALACYCLOVIR HCL 1 G PO TABS: 1000.0000 mg | ORAL_TABLET | Freq: Every day | ORAL | 1 refills | Status: DC

## 2018-04-20 MED ORDER — KETOROLAC TROMETHAMINE 60 MG/2ML IM SOLN
60.0000 mg | Freq: Once | INTRAMUSCULAR | Status: AC
  Administered 2018-04-20: 60 mg via INTRAMUSCULAR

## 2018-04-20 MED ORDER — MILNACIPRAN HCL 50 MG PO TABS: 50.0000 mg | ORAL_TABLET | Freq: Two times a day (BID) | ORAL | 1 refills | Status: DC

## 2018-04-20 MED ORDER — HYDROCODONE-ACETAMINOPHEN 10-325 MG PO TABS: 1.0000 | ORAL_TABLET | Freq: Three times a day (TID) | ORAL | 0 refills | Status: DC

## 2018-04-20 MED ORDER — PRAVASTATIN SODIUM 40 MG PO TABS: 40.0000 mg | ORAL_TABLET | Freq: Every day | ORAL | 1 refills | Status: DC

## 2018-04-20 MED ORDER — PANTOPRAZOLE SODIUM 40 MG PO TBEC: 40.0000 mg | DELAYED_RELEASE_TABLET | Freq: Two times a day (BID) | ORAL | 1 refills | Status: DC

## 2018-04-20 NOTE — Patient Instructions (Signed)

## 2018-04-20 NOTE — Progress Notes (Signed)
Subjective:    Patient ID: [name], female    DOB: 04-07-1961, 57 y.o.   MRN: [id number]   Chief Complaint: medical management of chronic issues  HPI:  1. Migraine with aura and without status migrainosus, not intractable Has at least 1-2 a moth. imitrex works well   2. Gastroesophageal reflux disease, esophagitis presence not specified  Patient is on daily protonix and says it is working well.  3. TMJ arthropathy  Bothers her osme days and not others  4. Uncomplicated opioid dependence ([name])  He has been dependent for quite some time. New pain contract today  5. Fibromyalgia  Pain assessment: Cause of pain- fibromyalgia Pain location- varies from day to day Pain on scale of 1-10- 8/10 Frequency- daily What increases pain-lots of activity What makes pain Better-rest  Effects on ADL - still works and does what she needs to. Any change in general medical condition-none  Current medications- norco 10/325 tid Effectiveness of current meds-make [name] tolerable Adverse reactions form pain meds-none Morphine equivalent- >20  Pill count performed-No Urine drug screen- No Was the [place] reviewed- yes  If yes were their any concerning findings? - no suspicious activity  Pain contract signed on: 04/20/18   6. GAD (generalized anxiety disorder)  Stays stressed but doe snot take in rx meds at this time  7. Primary insomnia  Does not sleep well but because of pain meds we do not want to give her anymore controlled meds  8. Pure hypercholesterolemia  Doe snot watch diet and does very little designated exercise.  9. BMI 26.0-26.9,adult  Weight down 2 lbs from previous visit    Outpatient Encounter Medications as of 04/20/2018  Medication Sig  . cyclobenzaprine (FLEXERIL) 10 MG tablet 1 po TID  . EPINEPHrine 0.3 mg/0.3 mL IJ SOAJ injection Inject 0.3 mLs (0.3 mg total) into the muscle once.  . furosemide (LASIX) 20 MG tablet Take 1 tablet (20 mg total) by mouth daily.  Marland Kitchen  HYDROcodone-acetaminophen (NORCO) 10-325 MG tablet Take 1 tablet by mouth every 8 (eight) hours as needed.  Marland Kitchen HYDROcodone-acetaminophen (NORCO) 10-325 MG tablet Take 1 tablet by mouth every 8 (eight) hours as needed.  Marland Kitchen HYDROcodone-acetaminophen (NORCO) 10-325 MG tablet Take 1 tablet by mouth every 8 (eight) hours as needed.  . loratadine (CLARITIN) 10 MG tablet Take 10 mg by mouth daily.  . meclizine (ANTIVERT) 25 MG tablet Take 1 tablet (25 mg total) by mouth 3 (three) times daily as needed for dizziness.  . Multiple Vitamin (MULTIVITAMIN) tablet Take 1 tablet by mouth daily.  . ondansetron (ZOFRAN) 4 MG tablet Take 1 tablet (4 mg total) by mouth every 8 (eight) hours as needed for nausea or vomiting.  . pantoprazole (PROTONIX) 40 MG tablet TAKE 1 TABLET BY MOUTH TWICE A DAY  . pravastatin (PRAVACHOL) 40 MG tablet Take 1 tablet (40 mg total) by mouth daily.  Marland Kitchen SAVELLA 50 MG TABS tablet TAKE 1 TABLET BY MOUTH TWICE A DAY  . SUMAtriptan (IMITREX) 100 MG tablet TAKE 1 TABLET EVERY 2 HOURS AS NEEDED FOR MIGRAINES  . valACYclovir (VALTREX) 1000 MG tablet TAKE 1 TABLET BY MOUTH EVERY DAY      New complaints: None today  Social history: Works daily. Has 2 daughters that do not get along abd sh estays in the middle of the drama despite trying not to.    Review of Systems  Constitutional: Negative for activity change and appetite change.  HENT: Negative.   Eyes: Negative  for pain.  Respiratory: Negative for shortness of breath.   Cardiovascular: Negative for chest pain, palpitations and leg swelling.  Gastrointestinal: Negative for abdominal pain.  Endocrine: Negative for polydipsia.  Genitourinary: Negative.   Skin: Negative for rash.  Neurological: Negative for dizziness, weakness and headaches.  Hematological: Does not bruise/bleed easily.  Psychiatric/Behavioral: Negative.   All other systems reviewed and are negative.      Objective:   Physical Exam  Constitutional: She is  oriented to person, place, and time. She appears well-developed and well-nourished. No distress.  HENT:  Head: Normocephalic.  Nose: Nose normal.  Mouth/Throat: Oropharynx is clear and moist.  Eyes: Pupils are equal, round, and reactive to light. EOM are normal.  Neck: Normal range of motion. Neck supple. No JVD present. Carotid bruit is not present.  Cardiovascular: Normal rate, regular rhythm, normal heart sounds and intact distal pulses.  Pulmonary/Chest: Effort normal and breath sounds normal. No respiratory distress. She has no wheezes. She has no rales. She exhibits no tenderness.  Abdominal: Soft. Normal appearance, normal aorta and bowel sounds are normal. She exhibits no distension, no abdominal bruit, no pulsatile midline mass and no mass. There is no splenomegaly or hepatomegaly. There is no tenderness.  Musculoskeletal: Normal range of motion. She exhibits no edema.  Slow moving today due to pain.  Lymphadenopathy:    She has no cervical adenopathy.  Neurological: She is alert and oriented to person, place, and time. She has normal reflexes.  Skin: Skin is warm and dry.  Psychiatric: She has a normal mood and affect. Her behavior is normal. Judgment and thought content normal.  Nursing note and vitals reviewed.  BP 123/77   Pulse 95   Temp 97.9 F (36.6 C) (Oral)   Ht '5\' 8"'$  (1.727 m)   Wt 182 lb (82.6 kg)   BMI 27.67 kg/m       Assessment & Plan:  [name] comes in today with chief complaint of Medical Management of Chronic Issues   Diagnosis and orders addressed:  1. Migraine with aura and without status migrainosus, not intractable imitrex as needed  2. Gastroesophageal reflux disease, esophagitis presence not specified Avoid spicy foods Do not eat 2 hours prior to bedtime - pantoprazole (PROTONIX) 40 MG tablet; Take 1 tablet (40 mg total) by mouth 2 (two) times daily.  Dispense: 180 tablet; Refill: 1  3. TMJ arthropathy  4. Uncomplicated opioid  dependence ([place])   5. Fibromyalgia Keep muscles warm - ketorolac (TORADOL) injection 60 mg - HYDROcodone-acetaminophen (NORCO) 10-325 MG tablet; Take 1 tablet by mouth 3 (three) times daily.  Dispense: 90 tablet; Refill: 0 - HYDROcodone-acetaminophen (NORCO) 10-325 MG tablet; Take 1 tablet by mouth 3 (three) times daily.  Dispense: 90 tablet; Refill: 0 - HYDROcodone-acetaminophen (NORCO) 10-325 MG tablet; Take 1 tablet by mouth 3 (three) times daily.  Dispense: 90 tablet; Refill: 0 - Milnacipran (SAVELLA) 50 MG TABS tablet; Take 1 tablet (50 mg total) by mouth 2 (two) times daily.  Dispense: 180 tablet; Refill: 1  6. GAD (generalized anxiety disorder) Stress management  7. Primary insomnia Bedtime routine  8. Pure hypercholesterolemia Low fat diet - pravastatin (PRAVACHOL) 40 MG tablet; Take 1 tablet (40 mg total) by mouth daily.  Dispense: 90 tablet; Refill: 1 - CMP14+EGFR - Lipid panel  9. BMI 26.0-26.9,adult Discussed diet and exercise for person with BMI >25 Will recheck weight in 3-6 months  10. HSV-2 infection - valACYclovir (VALTREX) 1000 MG tablet; Take 1 tablet (1,000  mg total) by mouth daily.  Dispense: 90 tablet; Refill: 1   Labs pending Health Maintenance reviewed Diet and exercise encouraged  Follow up plan: 3 months   Mary-Margaret Hassell Done, FNP

## 2018-04-28 NOTE — Progress Notes (Signed)
Attempted to contact patient - NA °

## 2018-05-12 ENCOUNTER — Ambulatory Visit (INDEPENDENT_AMBULATORY_CARE_PROVIDER_SITE_OTHER): Payer: 59 | Admitting: *Deleted

## 2018-05-12 DIAGNOSIS — Z23 Encounter for immunization: Secondary | ICD-10-CM

## 2018-07-02 ENCOUNTER — Telehealth: Payer: Self-pay | Admitting: *Deleted

## 2018-07-02 DIAGNOSIS — M797 Fibromyalgia: Secondary | ICD-10-CM

## 2018-07-02 NOTE — Telephone Encounter (Signed)
VM from patient [facility] [name] is out of her Hydrocodone-Acetaminophen 10-325 mg she has been waiting 12 days to get it filled, they do not know when they will be getting it in Would like this month's Rx sent to [facility]

## 2018-07-03 MED ORDER — HYDROCODONE-ACETAMINOPHEN 10-325 MG PO TABS: 1.0000 | ORAL_TABLET | Freq: Three times a day (TID) | ORAL | 0 refills | Status: DC

## 2018-07-03 NOTE — Addendum Note (Signed)
Addended by: Chevis Pretty on: 07/03/2018 01:11 PM   Modules accepted: Orders

## 2018-07-13 ENCOUNTER — Ambulatory Visit: Payer: 59 | Admitting: Nurse Practitioner

## 2018-07-13 ENCOUNTER — Encounter: Payer: Self-pay | Admitting: Nurse Practitioner

## 2018-07-13 VITALS — BP 141/93 | HR 106 | Temp 99.4°F | Ht 68.0 in | Wt 180.0 lb

## 2018-07-13 DIAGNOSIS — R51 Headache: Secondary | ICD-10-CM | POA: Diagnosis not present

## 2018-07-13 DIAGNOSIS — R52 Pain, unspecified: Secondary | ICD-10-CM | POA: Diagnosis not present

## 2018-07-13 DIAGNOSIS — J0101 Acute recurrent maxillary sinusitis: Secondary | ICD-10-CM

## 2018-07-13 DIAGNOSIS — R519 Headache, unspecified: Secondary | ICD-10-CM

## 2018-07-13 MED ORDER — AMOXICILLIN-POT CLAVULANATE 875-125 MG PO TABS
1.0000 | ORAL_TABLET | Freq: Two times a day (BID) | ORAL | 0 refills | Status: DC
Start: 2018-07-13 — End: 2018-07-20

## 2018-07-13 MED ORDER — FLUCONAZOLE 150 MG PO TABS: 150.0000 mg | ORAL_TABLET | Freq: Once | ORAL | 0 refills | Status: AC

## 2018-07-13 MED ORDER — KETOROLAC TROMETHAMINE 60 MG/2ML IM SOLN
60.0000 mg | Freq: Once | INTRAMUSCULAR | Status: AC
  Administered 2018-07-13: 60 mg via INTRAMUSCULAR

## 2018-07-13 NOTE — Progress Notes (Signed)
Subjective:    Patient ID: [name], female    DOB: 07/22/1960, 58 y.o.   MRN: [id number]   Chief Complaint: Headache; Fever; and Cough   HPI Patient come sin c/o body aches, fever, and headache. Fever 102 over weekend.   Review of Systems  Constitutional: Positive for chills and fever.  HENT: Positive for congestion, ear pain and sinus pressure.   Respiratory: Positive for cough (strate dthis morning).   Musculoskeletal: Positive for myalgias.  Neurological: Positive for headaches.  Psychiatric/Behavioral: Negative.   All other systems reviewed and are negative.      Objective:   Physical Exam Vitals signs and nursing note reviewed.  Constitutional:      Appearance: She is well-developed and normal weight.  HENT:     Right Ear: Hearing, tympanic membrane, ear canal and external ear normal.     Left Ear: Hearing, tympanic membrane and ear canal normal.     Nose: Mucosal edema, congestion and rhinorrhea present.     Right Sinus: Maxillary sinus tenderness present. No frontal sinus tenderness.     Left Sinus: Maxillary sinus tenderness present. No frontal sinus tenderness.     Mouth/Throat:     Lips: Pink.     Pharynx: Posterior oropharyngeal erythema present.     Tonsils: No tonsillar exudate or tonsillar abscesses. Swelling: 0 on the right. 0 on the left.  Neck:     Musculoskeletal: Normal range of motion and neck supple.  Cardiovascular:     Rate and Rhythm: Normal rate and regular rhythm.     Heart sounds: Normal heart sounds.  Pulmonary:     Effort: Pulmonary effort is normal.     Breath sounds: Normal breath sounds.  Lymphadenopathy:     Cervical: No cervical adenopathy.  Skin:    General: Skin is warm and dry.  Neurological:     Mental Status: She is alert and oriented to person, place, and time.  Psychiatric:        Mood and Affect: Mood normal.        Behavior: Behavior normal.    BP (!) 141/93   Pulse (!) 106   Temp 99.4 F (37.4 C) (Oral)    Ht 5\' 8"  (1.727 m)   Wt 180 lb (81.6 kg)   BMI 27.37 kg/m         Assessment & Plan:  [name] in today with chief complaint of Headache; Fever; and Cough   1. Body aches - Veritor Flu A/B Waived  2. Acute intractable headache, unspecified headache type - ketorolac (TORADOL) injection 60 mg  3. Acute recurrent maxillary sinusitis 1. Take meds as prescribed 2. Use a cool mist humidifier especially during the winter months and when heat has been humid. 3. Use saline nose sprays frequently 4. Saline irrigations of the nose can be very helpful if done frequently.  * 4X daily for 1 week*  * Use of a nettie pot can be helpful with this. Follow directions with this* 5. Drink plenty of fluids 6. Keep thermostat turn down low 7.For any cough or congestion  Use plain Mucinex- regular strength or max strength is fine   * Children- consult with Pharmacist for dosing 8. For fever or aces or pains- take tylenol or ibuprofen appropriate for age and weight.  * for fevers greater than 101 orally you may alternate ibuprofen and tylenol every  3 hours.    - amoxicillin-clavulanate (AUGMENTIN) 875-125 MG tablet; Take 1 tablet by  mouth 2 (two) times daily.  Dispense: 14 tablet; Refill: 0  Mary-Margaret Hassell Done, FNP

## 2018-07-13 NOTE — Patient Instructions (Signed)

## 2018-07-14 LAB — VERITOR FLU A/B WAIVED
INFLUENZA A: NEGATIVE
Influenza B: NEGATIVE

## 2018-07-20 ENCOUNTER — Ambulatory Visit: Payer: 59 | Admitting: Nurse Practitioner

## 2018-07-20 ENCOUNTER — Encounter: Payer: Self-pay | Admitting: Nurse Practitioner

## 2018-07-20 VITALS — BP 120/83 | HR 87 | Temp 97.2°F | Ht 68.0 in | Wt 180.0 lb

## 2018-07-20 DIAGNOSIS — B3731 Acute candidiasis of vulva and vagina: Secondary | ICD-10-CM

## 2018-07-20 DIAGNOSIS — B373 Candidiasis of vulva and vagina: Secondary | ICD-10-CM

## 2018-07-20 DIAGNOSIS — J0100 Acute maxillary sinusitis, unspecified: Secondary | ICD-10-CM | POA: Diagnosis not present

## 2018-07-20 MED ORDER — FLUCONAZOLE 150 MG PO TABS: 150.0000 mg | ORAL_TABLET | Freq: Once | ORAL | 0 refills | Status: AC

## 2018-07-20 MED ORDER — METHYLPREDNISOLONE ACETATE 80 MG/ML IJ SUSP
80.0000 mg | Freq: Once | INTRAMUSCULAR | Status: AC
  Administered 2018-07-20: 80 mg via INTRAMUSCULAR

## 2018-07-20 MED ORDER — FLUCONAZOLE 150 MG PO TABS: 150.0000 mg | ORAL_TABLET | Freq: Once | ORAL | 0 refills | Status: DC

## 2018-07-20 NOTE — Addendum Note (Signed)
Addended by: Chevis Pretty on: 07/20/2018 04:43 PM   Modules accepted: Orders

## 2018-07-20 NOTE — Progress Notes (Signed)
   Subjective:    Patient ID: [name], female    DOB: [birth year], 58 y.o.   MRN: [id number]   Chief Complaint: Cough (Wants diflucan); Nasal Congestion; and Headache (Some betrter from last visit)   HPI Patient comes in today c/o cough and congestion with sore throat. She has had headache for 2 days. She has been on Augmentin and has developed a yeast infection.   Review of Systems  Constitutional: Negative for chills and fever.  HENT: Positive for congestion and sore throat. Negative for trouble swallowing.   Respiratory: Positive for cough (productive).   Cardiovascular: Negative.   Gastrointestinal: Negative.   Musculoskeletal: Negative.   Neurological: Negative.   Psychiatric/Behavioral: Negative.   All other systems reviewed and are negative.      Objective:   Physical Exam HENT:     Right Ear: Hearing, tympanic membrane, ear canal and external ear normal.     Left Ear: Hearing, tympanic membrane, ear canal and external ear normal.     Nose: Mucosal edema, congestion and rhinorrhea present.     Right Sinus: No maxillary sinus tenderness or frontal sinus tenderness.     Left Sinus: No maxillary sinus tenderness or frontal sinus tenderness.     Mouth/Throat:     Lips: Pink.     Mouth: Mucous membranes are moist.     Pharynx: Oropharynx is clear.  Cardiovascular:     Heart sounds: Normal heart sounds.  Pulmonary:     Effort: Pulmonary effort is normal.     Breath sounds: Normal breath sounds.  Abdominal:     General: Bowel sounds are normal.  Skin:    General: Skin is warm.  Neurological:     Mental Status: She is alert.  Psychiatric:        Mood and Affect: Mood normal.        Behavior: Behavior normal.    BP 120/83   Pulse 87   Temp (!) 97.2 F (36.2 C) (Oral)   Ht 5\' 8"  (1.727 m)   Wt 180 lb (81.6 kg)   BMI 27.37 kg/m         Assessment & Plan:  [name] in today with chief complaint of Cough (Wants diflucan); Nasal Congestion; and  Headache (Some betrter from last visit)   1. Acute maxillary sinusitis, recurrence not specified Force fluids Continue nasal saline' Continue with humidifier [place] up prn - methylPREDNISolone acetate (DEPO-MEDROL) injection 80 mg  2. Vaginal candidiasis Diflucan 150mg  1 po now  Mary-Margaret Hassell Done, FNP

## 2018-08-11 ENCOUNTER — Other Ambulatory Visit: Payer: Self-pay

## 2018-08-11 DIAGNOSIS — M797 Fibromyalgia: Secondary | ICD-10-CM

## 2018-08-11 NOTE — Telephone Encounter (Signed)
Please advise 

## 2018-08-12 NOTE — Telephone Encounter (Signed)
Appointment scheduled for pain management.

## 2018-08-13 ENCOUNTER — Ambulatory Visit: Payer: 59 | Admitting: Nurse Practitioner

## 2018-08-13 ENCOUNTER — Encounter: Payer: Self-pay | Admitting: Nurse Practitioner

## 2018-08-13 VITALS — BP 119/94 | HR 83 | Temp 97.5°F | Ht 68.0 in | Wt 182.0 lb

## 2018-08-13 DIAGNOSIS — M797 Fibromyalgia: Secondary | ICD-10-CM | POA: Diagnosis not present

## 2018-08-13 DIAGNOSIS — R52 Pain, unspecified: Secondary | ICD-10-CM

## 2018-08-13 MED ORDER — HYDROCODONE-ACETAMINOPHEN 10-325 MG PO TABS: 1.0000 | ORAL_TABLET | Freq: Three times a day (TID) | ORAL | 0 refills | Status: DC

## 2018-08-13 NOTE — Progress Notes (Signed)
   Subjective:    Patient ID: [name], female    DOB: 1961-03-29, 58 y.o.   MRN: [id number]   Chief Complaint: Pain Management   HPI Pain assessment: Cause of pain- fibromyalgia Pain location- varies from day to day. Pain on scale of 1-10- 2/10 today Frequency- daily What increases pain-change in weather What makes pain Better-heat Effects on ADL - is always able to get done what she needs to get done Any change in general medical condition-none  Current opioids rx- hydrocodone 10/325 tid prn and savella 2x a day # meds rx- 90 Effectiveness of current meds-helps make pain tolerable. Adverse reactions form pain meds-none Morphine equivalent- 30  Pill count performed-No Last drug screen - years ( high risk q60m, moderate risk q9m, low risk yearly ) Urine drug screen today- Yes Was the [place] reviewed- yes  If yes were their any concerning findings? - no   *some days she only needs 1 tablet or maybe 2 tablets- her rx usually last er 4 months.  Pain contract signed on: 08/13/18    Review of Systems  Constitutional: Negative for activity change and appetite change.  HENT: Negative.   Eyes: Negative for pain.  Respiratory: Negative for shortness of breath.   Cardiovascular: Negative for chest pain, palpitations and leg swelling.  Gastrointestinal: Negative for abdominal pain.  Endocrine: Negative for polydipsia.  Genitourinary: Negative.   Musculoskeletal: Positive for myalgias.  Skin: Negative for rash.  Neurological: Negative for dizziness, weakness and headaches.  Hematological: Does not bruise/bleed easily.  Psychiatric/Behavioral: Negative.   All other systems reviewed and are negative.      Objective:   Physical Exam Constitutional:      Appearance: Normal appearance. She is normal weight.  Cardiovascular:     Rate and Rhythm: Normal rate and regular rhythm.     Heart sounds: Normal heart sounds.  Pulmonary:     Effort: Pulmonary effort is normal.     Breath sounds: Normal breath sounds.  Musculoskeletal: Normal range of motion.  Skin:    General: Skin is warm and dry.  Neurological:     General: No focal deficit present.     Mental Status: She is alert and oriented to person, place, and time.  Psychiatric:        Mood and Affect: Mood normal.        Behavior: Behavior normal.    BP (!) 119/94   Pulse 83   Temp (!) 97.5 F (36.4 C) (Oral)   Ht 5\' 8"  (1.727 m)   Wt 182 lb (82.6 kg)   BMI 27.67 kg/m        Assessment & Plan:  KAYLISE BLAKELEY in today with chief complaint of Pain Management   1. Fibromyalgia - HYDROcodone-acetaminophen (NORCO) 10-325 MG tablet; Take 1 tablet by mouth 3 (three) times daily for 30 days.  Dispense: 90 tablet; Refill: 0 - HYDROcodone-acetaminophen (NORCO) 10-325 MG tablet; Take 1 tablet by mouth 3 (three) times daily for 30 days.  Dispense: 90 tablet; Refill: 0 - HYDROcodone-acetaminophen (NORCO) 10-325 MG tablet; Take 1 tablet by mouth 3 (three) times daily for 30 days.  Dispense: 90 tablet; Refill: 0  2. Pain management Urine drug sren today - ToxASSURE Select 13 (MW), Urine  Mary-Margaret Hassell Done, FNP

## 2018-08-19 LAB — TOXASSURE SELECT 13 (MW), URINE

## 2018-09-16 ENCOUNTER — Other Ambulatory Visit: Payer: Self-pay

## 2018-09-16 ENCOUNTER — Ambulatory Visit (INDEPENDENT_AMBULATORY_CARE_PROVIDER_SITE_OTHER): Payer: 59 | Admitting: Family Medicine

## 2018-09-16 DIAGNOSIS — F430 Acute stress reaction: Secondary | ICD-10-CM | POA: Diagnosis not present

## 2018-09-16 DIAGNOSIS — F410 Panic disorder [episodic paroxysmal anxiety] without agoraphobia: Secondary | ICD-10-CM | POA: Diagnosis not present

## 2018-09-16 MED ORDER — HYDROXYZINE HCL 25 MG PO TABS: 12.5000 mg | ORAL_TABLET | Freq: Three times a day (TID) | ORAL | 0 refills | Status: DC | PRN

## 2018-09-16 NOTE — Progress Notes (Signed)
Telephone visit  Subjective: CC: chest tightness PCP: Chevis Pretty, FNP QPY:[id number] [name] is a 58 y.o. female calls for telephone consult today. Patient provides verbal consent for consult held via phone.  Location of patient: home Location of provider: [facility] Others present for call: none  1. Chest tightness Patient reports about a 5-6 day history of chest tightness/ "sensation of elephant on her" clavicles.  She notes that this occurs about 80% of the day and seems to be intermittent, waxing and waning.  She sometimes feels like it is hard to take a deep breath and when this happens but denies any overt shortness of breath, change in exercise tolerance, nausea, vomiting, diaphoresis, chest pain, cough, rhinorrhea, headache or fever.  She is a former smoker and quit about 6 to 7 years ago.  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  She is on Savella twice daily but is not on any PRN anxiety medications because of use of opioids chronically.  She reports poor sleep, increased anxiety and stress.   ROS: Per HPI  Allergies  Allergen Reactions  . Other Anaphylaxis    Plastic bottles; patient drank from a water bottle and had to use Epi pen  . Peanuts [Peanut Oil] Anaphylaxis  . Statins Other (See Comments)    Severe joint pain  . Tape Other (See Comments)    Causes red blisters on skin. Can use paper tape   Past Medical History:  Diagnosis Date  . Arthritis    right hand  . Dyslipidemia   . Family history of anesthesia complication    patients mother has severe nausea and vomiting after  . Fibromyalgia   . GERD (gastroesophageal reflux disease)   . Guillain-Barre syndrome ([place])   . Hiatal hernia   . Kidney stones   . Migraines   . PONV (postoperative nausea and vomiting)   . TMJ (dislocation of temporomandibular joint)   . UTI  (urinary tract infection)    hx of  . Vitamin D deficiency     Current Outpatient Medications:  .  cyclobenzaprine (FLEXERIL) 10 MG tablet, 1 po TID, Disp: 30 tablet, Rfl: 1 .  EPINEPHrine 0.3 mg/0.3 mL IJ SOAJ injection, Inject 0.3 mLs (0.3 mg total) into the muscle once., Disp: 2 Device, Rfl: 1 .  furosemide (LASIX) 20 MG tablet, Take 1 tablet (20 mg total) by mouth daily., Disp: 30 tablet, Rfl: 11 .  [START ON 10/12/2018] HYDROcodone-acetaminophen (NORCO) 10-325 MG tablet, Take 1 tablet by mouth 3 (three) times daily for 30 days., Disp: 90 tablet, Rfl: 0 .  HYDROcodone-acetaminophen (NORCO) 10-325 MG tablet, Take 1 tablet by mouth 3 (three) times daily for 30 days., Disp: 90 tablet, Rfl: 0 .  HYDROcodone-acetaminophen (NORCO) 10-325 MG tablet, Take 1 tablet by mouth 3 (three) times daily for 30 days., Disp: 90 tablet, Rfl: 0 .  loratadine (CLARITIN) 10 MG tablet, Take 10 mg by mouth daily., Disp: , Rfl:  .  meclizine (ANTIVERT) 25 MG tablet, Take 1 tablet (25 mg total) by mouth 3 (three) times daily as needed for dizziness., Disp: 30 tablet, Rfl: 0 .  Milnacipran (SAVELLA) 50 MG TABS tablet, Take 1 tablet (50 mg total) by mouth 2 (two) times daily., Disp: 180 tablet, Rfl: 1 .  Multiple Vitamin (MULTIVITAMIN) tablet, Take 1 tablet by mouth daily., Disp: , Rfl:  .  ondansetron (ZOFRAN) 4 MG tablet, Take 1 tablet (4 mg total) by mouth every 8 (eight) hours as needed for nausea or vomiting., Disp: 20 tablet, Rfl: 0 .  pantoprazole (PROTONIX) 40 MG tablet, Take 1 tablet (40 mg total) by mouth 2 (two) times daily., Disp: 180 tablet, Rfl: 1 .  pravastatin (PRAVACHOL) 40 MG tablet, Take 1 tablet (40 mg total) by mouth daily., Disp: 90 tablet, Rfl: 1 .  SUMAtriptan (IMITREX) 100 MG tablet, TAKE 1 TABLET EVERY 2 HOURS AS NEEDED FOR MIGRAINES, Disp: 12 tablet, Rfl: 2 .  valACYclovir (VALTREX) 1000 MG tablet, Take 1 tablet (1,000 mg total) by mouth daily., Disp: 90 tablet, Rfl: 1  Psych: Mood stable.   Patient somewhat anxious but pleasant and interactive over the phone. Depression screen [facility] 2/9 09/16/2018 08/13/2018 07/20/2018  Decreased Interest 0 0 0  Down, Depressed, Hopeless 1 0 0  PHQ - 2 Score 1 0 0  Altered sleeping - - -  Tired, decreased energy - - -  Change in appetite - - -  Feeling bad or failure about yourself  - - -  Trouble concentrating - - -  Moving slowly or fidgety/restless - - -  Suicidal thoughts - - -  PHQ-9 Score - - -  Difficult doing work/chores - - -   GAD 7 : Generalized Anxiety Score 09/16/2018 08/25/2015  Nervous, Anxious, on Edge 3 3  Control/stop worrying 3 3  Worry too much - different things 3 3  Trouble relaxing 3 3  Restless 0 0  Easily annoyed or irritable 3 3  Afraid - awful might happen 0 0  Total GAD 7 Score 15 15  Anxiety Difficulty Not difficult at all -   Assessment/ Plan: 58 y.o. female   1. Panic attack due to exceptional stress I suspect that she is having physical manifestations of anxiety and stress.  I have added as needed hydroxyzine, caution sedation and possible cottonmouth.  We discussed reasons for emergent evaluation including symptoms or signs suggestive of MI, which were reviewed with the patient today.  If symptoms worsen for any reason or she develops any other worrisome symptoms or signs she is to seek immediate medical attention emergency department. - hydrOXYzine (ATARAX/VISTARIL) 25 MG tablet; Take 0.5-1 tablets (12.5-25 mg total) by mouth every 8 (eight) hours as needed for anxiety.  Dispense: 30 tablet; Refill: 0   Start time: 2:25pm End time: 2:36pm  Total time spent on patient care (including telephone call/ virtual visit): 15 minutes  [place], [place] [phone]

## 2018-11-06 ENCOUNTER — Other Ambulatory Visit: Payer: Self-pay

## 2018-11-09 ENCOUNTER — Other Ambulatory Visit: Payer: Self-pay

## 2018-11-09 ENCOUNTER — Encounter: Payer: Self-pay | Admitting: Nurse Practitioner

## 2018-11-09 ENCOUNTER — Ambulatory Visit (INDEPENDENT_AMBULATORY_CARE_PROVIDER_SITE_OTHER): Payer: 59 | Admitting: Nurse Practitioner

## 2018-11-09 DIAGNOSIS — Z0289 Encounter for other administrative examinations: Secondary | ICD-10-CM

## 2018-11-09 DIAGNOSIS — Z6826 Body mass index (BMI) 26.0-26.9, adult: Secondary | ICD-10-CM

## 2018-11-09 DIAGNOSIS — K219 Gastro-esophageal reflux disease without esophagitis: Secondary | ICD-10-CM | POA: Diagnosis not present

## 2018-11-09 DIAGNOSIS — B009 Herpesviral infection, unspecified: Secondary | ICD-10-CM

## 2018-11-09 DIAGNOSIS — G43109 Migraine with aura, not intractable, without status migrainosus: Secondary | ICD-10-CM | POA: Diagnosis not present

## 2018-11-09 DIAGNOSIS — F5101 Primary insomnia: Secondary | ICD-10-CM

## 2018-11-09 DIAGNOSIS — M797 Fibromyalgia: Secondary | ICD-10-CM | POA: Diagnosis not present

## 2018-11-09 DIAGNOSIS — F411 Generalized anxiety disorder: Secondary | ICD-10-CM

## 2018-11-09 DIAGNOSIS — E7800 Pure hypercholesterolemia, unspecified: Secondary | ICD-10-CM

## 2018-11-09 MED ORDER — PRAVASTATIN SODIUM 40 MG PO TABS: 40.0000 mg | ORAL_TABLET | Freq: Every day | ORAL | 1 refills | Status: DC

## 2018-11-09 MED ORDER — HYDROCODONE-ACETAMINOPHEN 10-325 MG PO TABS: 1.0000 | ORAL_TABLET | Freq: Three times a day (TID) | ORAL | 0 refills | Status: DC

## 2018-11-09 MED ORDER — VALACYCLOVIR HCL 1 G PO TABS: 1000.0000 mg | ORAL_TABLET | Freq: Every day | ORAL | 1 refills | Status: DC

## 2018-11-09 MED ORDER — MILNACIPRAN HCL 50 MG PO TABS: 50.0000 mg | ORAL_TABLET | Freq: Two times a day (BID) | ORAL | 1 refills | Status: DC

## 2018-11-09 MED ORDER — PANTOPRAZOLE SODIUM 40 MG PO TBEC: 40.0000 mg | DELAYED_RELEASE_TABLET | Freq: Two times a day (BID) | ORAL | 1 refills | Status: DC

## 2018-11-09 MED ORDER — SUMATRIPTAN SUCCINATE 100 MG PO TABS: ORAL_TABLET | ORAL | 2 refills | Status: DC

## 2018-11-09 MED ORDER — FUROSEMIDE 20 MG PO TABS: 20.0000 mg | ORAL_TABLET | Freq: Every day | ORAL | 11 refills | Status: DC

## 2018-11-09 NOTE — Progress Notes (Signed)
Patient ID: [name], female   DOB: December 03, 1960, 58 y.o.   MRN: [id number]    Virtual Visit via telephone Note  I connected with [name] on 11/09/18 at 2:35 PM by telephone and verified that I am speaking with the correct person using two identifiers. [name] is currently located at home and no one is currently with her during visit. The provider, Mary-Margaret Hassell Done, FNP is located in their office at time of visit.  I discussed the limitations, risks, security and privacy concerns of performing an evaluation and management service by telephone and the availability of in person appointments. I also discussed with the patient that there may be a patient responsible charge related to this service. The patient expressed understanding and agreed to proceed.   History and Present Illness:   Chief Complaint: Medical Management of Chronic Issues    HPI:  1. Pure hypercholesterolemia Tries watching diet daily. Does very little exercise  2. Gastroesophageal reflux disease, esophagitis presence not specified Is on protonix daily and it works well to get symptoms under control  3. Migraine with aura and without status migrainosus, not intractable Still has occasional migraines ut are responsive to rest   4. Fibromyalgia Has daily pain. Rates 5/10 today. Is on savalla and hydrocodone. Combination is working well for her. The wet weather makes pain worse.  5. GAD (generalized anxiety disorder) Is able to control her anxiety with stress management  6. Primary insomnia Has not been sleeping well due to pain. Once rain goes away she will sleep better.  7. Pain management contract agreement 08/17/18  8. BMI 26.0-26.9,adult No recent weight changes.    Outpatient Encounter Medications as of 11/09/2018  Medication Sig  . cyclobenzaprine (FLEXERIL) 10 MG tablet 1 po TID  . EPINEPHrine 0.3 mg/0.3 mL IJ SOAJ injection Inject 0.3 mLs (0.3 mg total) into the muscle once.  .  furosemide (LASIX) 20 MG tablet Take 1 tablet (20 mg total) by mouth daily.  Marland Kitchen HYDROcodone-acetaminophen (NORCO) 10-325 MG tablet Take 1 tablet by mouth 3 (three) times daily for 30 days.  Marland Kitchen HYDROcodone-acetaminophen (NORCO) 10-325 MG tablet Take 1 tablet by mouth 3 (three) times daily for 30 days.  Marland Kitchen HYDROcodone-acetaminophen (NORCO) 10-325 MG tablet Take 1 tablet by mouth 3 (three) times daily for 30 days.  . hydrOXYzine (ATARAX/VISTARIL) 25 MG tablet Take 0.5-1 tablets (12.5-25 mg total) by mouth every 8 (eight) hours as needed for anxiety.  Marland Kitchen loratadine (CLARITIN) 10 MG tablet Take 10 mg by mouth daily.  . meclizine (ANTIVERT) 25 MG tablet Take 1 tablet (25 mg total) by mouth 3 (three) times daily as needed for dizziness.  . Milnacipran (SAVELLA) 50 MG TABS tablet Take 1 tablet (50 mg total) by mouth 2 (two) times daily.  . Multiple Vitamin (MULTIVITAMIN) tablet Take 1 tablet by mouth daily.  . ondansetron (ZOFRAN) 4 MG tablet Take 1 tablet (4 mg total) by mouth every 8 (eight) hours as needed for nausea or vomiting.  . pantoprazole (PROTONIX) 40 MG tablet Take 1 tablet (40 mg total) by mouth 2 (two) times daily.  . pravastatin (PRAVACHOL) 40 MG tablet Take 1 tablet (40 mg total) by mouth daily.  . SUMAtriptan (IMITREX) 100 MG tablet TAKE 1 TABLET EVERY 2 HOURS AS NEEDED FOR MIGRAINES  . valACYclovir (VALTREX) 1000 MG tablet Take 1 tablet (1,000 mg total) by mouth daily.    New complaints: None  today  Social history: Will be a first time grandma  in 2 weeks      Review of Systems  Constitutional: Negative for diaphoresis and weight loss.  Eyes: Negative for blurred vision, double vision and pain.  Respiratory: Negative for shortness of breath.   Cardiovascular: Negative for chest pain, palpitations, orthopnea and leg swelling.  Gastrointestinal: Negative for abdominal pain.  Musculoskeletal: Positive for back pain and myalgias.  Skin: Negative for rash.  Neurological: Negative  for dizziness, sensory change, loss of consciousness, weakness and headaches.  Endo/Heme/Allergies: Negative for polydipsia. Does not bruise/bleed easily.  Psychiatric/Behavioral: Negative for memory loss. The patient does not have insomnia.   All other systems reviewed and are negative.    Observations/Objective: Alert and oriented Answers all questions appropriately  Assessment and Plan: [name] comes in today with chief complaint of Medical Management of Chronic Issues   Diagnosis and orders addressed:  1. Pure hypercholesterolemia Low fat diet - pravastatin (PRAVACHOL) 40 MG tablet; Take 1 tablet (40 mg total) by mouth daily.  Dispense: 90 tablet; Refill: 1  2. Gastroesophageal reflux disease, esophagitis presence not specified Avoid spicy foods Do not eat 2 hours prior to bedtime - pantoprazole (PROTONIX) 40 MG tablet; Take 1 tablet (40 mg total) by mouth 2 (two) times daily.  Dispense: 180 tablet; Refill: 1  3. Migraine with aura and without status migrainosus, not intractable - SUMAtriptan (IMITREX) 100 MG tablet; May repeat in 2 hours if headache persists or recurs.  Dispense: 12 tablet; Refill: 2  4. Fibromyalgia Stay warm - HYDROcodone-acetaminophen (NORCO) 10-325 MG tablet; Take 1 tablet by mouth 3 (three) times daily for 30 days.  Dispense: 90 tablet; Refill: 0 - HYDROcodone-acetaminophen (NORCO) 10-325 MG tablet; Take 1 tablet by mouth 3 (three) times daily for 30 days.  Dispense: 90 tablet; Refill: 0 - HYDROcodone-acetaminophen (NORCO) 10-325 MG tablet; Take 1 tablet by mouth 3 (three) times daily for 30 days.  Dispense: 90 tablet; Refill: 0 - Milnacipran (SAVELLA) 50 MG TABS tablet; Take 1 tablet (50 mg total) by mouth 2 (two) times daily.  Dispense: 180 tablet; Refill: 1  5. GAD (generalized anxiety disorder) Stress management  6. Primary insomnia Bedtime routine  7. Pain management contract agreement  8. BMI 26.0-26.9,adult Discussed diet and  exercise for person with BMI >25 Will recheck weight in 3-6 months  9. HSV-2 infection - valACYclovir (VALTREX) 1000 MG tablet; Take 1 tablet (1,000 mg total) by mouth daily.  Dispense: 90 tablet; Refill: 1   Labs pending Health Maintenance reviewed Diet and exercise encouraged  Follow up plan: 3 months     I discussed the assessment and treatment plan with the patient. The patient was provided an opportunity to ask questions and all were answered. The patient agreed with the plan and demonstrated an understanding of the instructions.   The patient was advised to call back or seek an in-person evaluation if the symptoms worsen or if the condition fails to improve as anticipated.  The above assessment and management plan was discussed with the patient. The patient verbalized understanding of and has agreed to the management plan. Patient is aware to call the clinic if symptoms persist or worsen. Patient is aware when to return to the clinic for a follow-up visit. Patient educated on when it is appropriate to go to the emergency department.   Time call ended:  2:55  I provided 20 minutes of non-face-to-face time during this encounter.    Mary-Margaret Hassell Done, FNP

## 2019-01-21 ENCOUNTER — Encounter: Payer: Self-pay | Admitting: Nurse Practitioner

## 2019-01-21 ENCOUNTER — Ambulatory Visit (INDEPENDENT_AMBULATORY_CARE_PROVIDER_SITE_OTHER): Payer: 59 | Admitting: Nurse Practitioner

## 2019-01-21 ENCOUNTER — Other Ambulatory Visit: Payer: Self-pay

## 2019-01-21 DIAGNOSIS — M79604 Pain in right leg: Secondary | ICD-10-CM

## 2019-01-21 NOTE — Progress Notes (Signed)
   Virtual Visit via telephone Note Due to COVID-19 pandemic this visit was conducted virtually. This visit type was conducted due to national recommendations for restrictions regarding the COVID-19 Pandemic (e.g. social distancing, sheltering in place) in an effort to limit this patient's exposure and mitigate transmission in our community. All issues noted in this document were discussed and addressed.  A physical exam was not performed with this format.  I connected with [name] on 01/21/19 at 3:20 by telephone and verified that I am speaking with the correct person using two identifiers. [name] is currently located at home and no one is currently with her during visit. The provider, Mary-Margaret Hassell Done, FNP is located in their office at time of visit.  I discussed the limitations, risks, security and privacy concerns of performing an evaluation and management service by telephone and the availability of in person appointments. I also discussed with the patient that there may be a patient responsible charge related to this service. The patient expressed understanding and agreed to proceed.   History and Present Illness:  Patient is calling in c/o right leg. Started 2 weeks ago.pain is on the medial side of right knee and radiates down the calf. Painful to walk on. No edema. Cool to touch. denies injury. Rates pain 6/10 currently. She has vicodin and that is not touching the pain.    Review of Systems  Constitutional: Negative for diaphoresis and weight loss.  Eyes: Negative for blurred vision, double vision and pain.  Respiratory: Negative for shortness of breath.   Cardiovascular: Negative for chest pain, palpitations, orthopnea and leg swelling.  Gastrointestinal: Negative for abdominal pain.  Skin: Negative for rash.  Neurological: Negative for dizziness, sensory change, loss of consciousness, weakness and headaches.  Endo/Heme/Allergies: Negative for polydipsia. Does not  bruise/bleed easily.  Psychiatric/Behavioral: Negative for memory loss. The patient does not have insomnia.   All other systems reviewed and are negative.    Observations/Objective: Alert and oriented- answers all questions appropriately Mild distress When asked to flex foot up pain increases in calf  Assessment and Plan: [name] in today with chief complaint of Leg Pain   1. Right leg pain Will call with doppler results  Follow Up Instructions: prn    I discussed the assessment and treatment plan with the patient. The patient was provided an opportunity to ask questions and all were answered. The patient agreed with the plan and demonstrated an understanding of the instructions.   The patient was advised to call back or seek an in-person evaluation if the symptoms worsen or if the condition fails to improve as anticipated.  The above assessment and management plan was discussed with the patient. The patient verbalized understanding of and has agreed to the management plan. Patient is aware to call the clinic if symptoms persist or worsen. Patient is aware when to return to the clinic for a follow-up visit. Patient educated on when it is appropriate to go to the emergency department.   Time call ended:  3:20  I provided 10 minutes of non-face-to-face time during this encounter.    Mary-Margaret Hassell Done, FNP

## 2019-01-21 NOTE — Addendum Note (Signed)
Addended by: Chevis Pretty on: 01/21/2019 03:51 PM   Modules accepted: Orders

## 2019-01-22 ENCOUNTER — Ambulatory Visit (HOSPITAL_COMMUNITY): Payer: 59

## 2019-01-25 ENCOUNTER — Ambulatory Visit (HOSPITAL_COMMUNITY)
Admission: RE | Admit: 2019-01-25 | Discharge: 2019-01-25 | Disposition: A | Discharge status: Home / Self Care | Payer: 59 | Source: Ambulatory Visit | Attending: Nurse Practitioner | Admitting: Nurse Practitioner

## 2019-01-25 ENCOUNTER — Other Ambulatory Visit: Payer: Self-pay

## 2019-01-25 DIAGNOSIS — M79604 Pain in right leg: Secondary | ICD-10-CM

## 2019-01-25 NOTE — Progress Notes (Signed)
RLE venous duplex       has been completed. Preliminary results can be found under CV proc through chart review. June Leap, BS, RDMS, RVT    Called office of Mary-Margaret Hassell Done at 9:12am to give results. Gave results to [place]. 9:20 AM

## 2019-02-09 ENCOUNTER — Encounter: Payer: Self-pay | Admitting: Nurse Practitioner

## 2019-02-09 ENCOUNTER — Ambulatory Visit (INDEPENDENT_AMBULATORY_CARE_PROVIDER_SITE_OTHER): Payer: 59 | Admitting: Nurse Practitioner

## 2019-02-09 DIAGNOSIS — Z6826 Body mass index (BMI) 26.0-26.9, adult: Secondary | ICD-10-CM

## 2019-02-09 DIAGNOSIS — M797 Fibromyalgia: Secondary | ICD-10-CM

## 2019-02-09 DIAGNOSIS — E7800 Pure hypercholesterolemia, unspecified: Secondary | ICD-10-CM | POA: Diagnosis not present

## 2019-02-09 DIAGNOSIS — G43109 Migraine with aura, not intractable, without status migrainosus: Secondary | ICD-10-CM

## 2019-02-09 DIAGNOSIS — F5101 Primary insomnia: Secondary | ICD-10-CM | POA: Diagnosis not present

## 2019-02-09 DIAGNOSIS — K219 Gastro-esophageal reflux disease without esophagitis: Secondary | ICD-10-CM

## 2019-02-09 DIAGNOSIS — F411 Generalized anxiety disorder: Secondary | ICD-10-CM

## 2019-02-09 MED ORDER — HYDROCODONE-ACETAMINOPHEN 10-325 MG PO TABS: 1.0000 | ORAL_TABLET | Freq: Three times a day (TID) | ORAL | 0 refills | Status: DC

## 2019-02-09 MED ORDER — SAVELLA 50 MG PO TABS: 50.0000 mg | ORAL_TABLET | Freq: Two times a day (BID) | ORAL | 1 refills | Status: DC

## 2019-02-09 NOTE — Progress Notes (Signed)
Virtual Visit via telephone Note Due to COVID-19 pandemic this visit was conducted virtually. This visit type was conducted due to national recommendations for restrictions regarding the COVID-19 Pandemic (e.g. social distancing, sheltering in place) in an effort to limit this patient's exposure and mitigate transmission in our community. All issues noted in this document were discussed and addressed.  A physical exam was not performed with this format.  I connected with [name] on 02/09/19 at 2:05 by telephone and verified that I am speaking with the correct person using two identifiers. [name] is currently located at work and no one is currently with her during visit. The provider, Mary-Margaret Hassell Done, FNP is located in their office at time of visit.  I discussed the limitations, risks, security and privacy concerns of performing an evaluation and management service by telephone and the availability of in person appointments. I also discussed with the patient that there may be a patient responsible charge related to this service. The patient expressed understanding and agreed to proceed.   History and Present Illness:   Chief Complaint: Medical Management of Chronic Issues    HPI:  1. Pure hypercholesterolemia Tries to watch diet but  Does not do much exercise  2. Primary insomnia Sleep has gotten some better  3. Gastroesophageal reflux disease, esophagitis presence not specified Takes protonix daily to keep symptoms from flaring up.  4. Migraine with aura and without status migrainosus, not intractable Still 2-3 migraines a month but imitrex will usually resolve it.  5. Fibromyalgia Rates muscles aches 5-7 most days. Her pain meds usually make pain more bearable. The savella also helps with her pain.  6. GAD (generalized anxiety disorder) Says she is doing ok.  7. BMI 26.0-26.9,adult No recent weight changes    Outpatient Encounter Medications as of  02/09/2019  Medication Sig  . cyclobenzaprine (FLEXERIL) 10 MG tablet 1 po TID  . EPINEPHrine 0.3 mg/0.3 mL IJ SOAJ injection Inject 0.3 mLs (0.3 mg total) into the muscle once.  . furosemide (LASIX) 20 MG tablet Take 1 tablet (20 mg total) by mouth daily.  Marland Kitchen HYDROcodone-acetaminophen (NORCO) 10-325 MG tablet Take 1 tablet by mouth 3 (three) times daily for 30 days.  Marland Kitchen HYDROcodone-acetaminophen (NORCO) 10-325 MG tablet Take 1 tablet by mouth 3 (three) times daily for 30 days.  Marland Kitchen HYDROcodone-acetaminophen (NORCO) 10-325 MG tablet Take 1 tablet by mouth 3 (three) times daily for 30 days.  . hydrOXYzine (ATARAX/VISTARIL) 25 MG tablet Take 0.5-1 tablets (12.5-25 mg total) by mouth every 8 (eight) hours as needed for anxiety.  Marland Kitchen loratadine (CLARITIN) 10 MG tablet Take 10 mg by mouth daily.  . meclizine (ANTIVERT) 25 MG tablet Take 1 tablet (25 mg total) by mouth 3 (three) times daily as needed for dizziness.  . Milnacipran (SAVELLA) 50 MG TABS tablet Take 1 tablet (50 mg total) by mouth 2 (two) times daily.  . Multiple Vitamin (MULTIVITAMIN) tablet Take 1 tablet by mouth daily.  . ondansetron (ZOFRAN) 4 MG tablet Take 1 tablet (4 mg total) by mouth every 8 (eight) hours as needed for nausea or vomiting.  . pantoprazole (PROTONIX) 40 MG tablet Take 1 tablet (40 mg total) by mouth 2 (two) times daily.  . pravastatin (PRAVACHOL) 40 MG tablet Take 1 tablet (40 mg total) by mouth daily.  . SUMAtriptan (IMITREX) 100 MG tablet May repeat in 2 hours if headache persists or recurs.  . valACYclovir (VALTREX) 1000 MG tablet Take 1 tablet (1,000 mg  total) by mouth daily.     Past Surgical History:  Procedure Laterality Date  . ABDOMINAL HYSTERECTOMY    . ADNOIDS    . ANTERIOR CERVICAL DECOMP/DISCECTOMY FUSION N/A 10/27/2013   Procedure: ACDF/ANTERIOR CERVICAL DECOMPRESSION/DISCECTOMY FUSION  C5-C7  (2 LEVELS);  Surgeon: Melina Schools, MD;  Location: [facility];  Service: Orthopedics;  Laterality: N/A;  . EYE  SURGERY Bilateral    cataract removal    Family History  Problem Relation Age of Onset  . Heart disease Mother   . Cancer Father        STOMACH CANCER  . Cancer Maternal Grandfather   . Diabetes Paternal Grandmother     New complaints: none today  Social history: her first grandson was born a few months ago.  Controlled substance contract: 08/17/18    Review of Systems  Constitutional: Negative for diaphoresis and weight loss.  Eyes: Negative for blurred vision, double vision and pain.  Respiratory: Negative for shortness of breath.   Cardiovascular: Negative for chest pain, palpitations, orthopnea and leg swelling.  Gastrointestinal: Negative for abdominal pain.  Musculoskeletal: Positive for myalgias.  Skin: Negative for rash.  Neurological: Negative for dizziness, sensory change, loss of consciousness, weakness and headaches.  Endo/Heme/Allergies: Negative for polydipsia. Does not bruise/bleed easily.  Psychiatric/Behavioral: Negative for memory loss. The patient does not have insomnia.   All other systems reviewed and are negative.    Observations/Objective: Alert and oriented- answers all questions appropriately mild distress    Assessment and Plan: [name] comes in today with chief complaint of Medical Management of Chronic Issues   Diagnosis and orders addressed:  1. Pure hypercholesterolemia Low fat diet  2. Primary insomnia Bedtime routine  3. Gastroesophageal reflux disease, esophagitis presence not specified Avoid spicy foods Do not eat 2 hours prior to bedtime  4. Migraine with aura and without status migrainosus, not intractable Avoid caffeine initrex as needed  5. Fibromyalgia Keep warm exerise - Milnacipran (SAVELLA) 50 MG TABS tablet; Take 1 tablet (50 mg total) by mouth 2 (two) times daily.  Dispense: 180 tablet; Refill: 1 - HYDROcodone-acetaminophen (NORCO) 10-325 MG tablet; Take 1 tablet by mouth 3 (three) times daily.   Dispense: 90 tablet; Refill: 0 - HYDROcodone-acetaminophen (NORCO) 10-325 MG tablet; Take 1 tablet by mouth 3 (three) times daily.  Dispense: 90 tablet; Refill: 0 - HYDROcodone-acetaminophen (NORCO) 10-325 MG tablet; Take 1 tablet by mouth 3 (three) times daily.  Dispense: 90 tablet; Refill: 0  6. GAD (generalized anxiety disorder) Stress management  7. BMI 26.0-26.9,adult Discussed diet and exercise for person with BMI >25 Will recheck weight in 3-6 months   Previous labs reviewed Health Maintenance reviewed Diet and exercise encouraged  Follow up plan: 3 months      I discussed the assessment and treatment plan with the patient. The patient was provided an opportunity to ask questions and all were answered. The patient agreed with the plan and demonstrated an understanding of the instructions.   The patient was advised to call back or seek an in-person evaluation if the symptoms worsen or if the condition fails to improve as anticipated.  The above assessment and management plan was discussed with the patient. The patient verbalized understanding of and has agreed to the management plan. Patient is aware to call the clinic if symptoms persist or worsen. Patient is aware when to return to the clinic for a follow-up visit. Patient educated on when it is appropriate to go to the emergency department.  Time call ended:  2:17  I provided 12 minutes of non-face-to-face time during this encounter.    Mary-Margaret Hassell Done, FNP

## 2019-02-24 ENCOUNTER — Other Ambulatory Visit: Payer: Self-pay | Admitting: *Deleted

## 2019-02-24 MED ORDER — FUROSEMIDE 20 MG PO TABS: 20.0000 mg | ORAL_TABLET | Freq: Every day | ORAL | 2 refills | Status: DC

## 2019-03-01 ENCOUNTER — Telehealth: Payer: Self-pay | Admitting: Nurse Practitioner

## 2019-03-01 DIAGNOSIS — M797 Fibromyalgia: Secondary | ICD-10-CM

## 2019-03-02 NOTE — Telephone Encounter (Signed)
Referral made 

## 2019-03-05 ENCOUNTER — Other Ambulatory Visit: Payer: Self-pay

## 2019-03-08 ENCOUNTER — Ambulatory Visit: Payer: 59 | Admitting: Physician Assistant

## 2019-03-08 ENCOUNTER — Other Ambulatory Visit: Payer: Self-pay

## 2019-03-08 ENCOUNTER — Encounter: Payer: Self-pay | Admitting: Physician Assistant

## 2019-03-08 VITALS — BP 125/78 | HR 104 | Temp 97.5°F | Ht 68.0 in | Wt 177.8 lb

## 2019-03-08 DIAGNOSIS — M25469 Effusion, unspecified knee: Secondary | ICD-10-CM

## 2019-03-08 DIAGNOSIS — G8929 Other chronic pain: Secondary | ICD-10-CM | POA: Diagnosis not present

## 2019-03-08 DIAGNOSIS — M25561 Pain in right knee: Secondary | ICD-10-CM

## 2019-03-08 MED ORDER — MELOXICAM 15 MG PO TABS: 15.0000 mg | ORAL_TABLET | Freq: Every day | ORAL | 0 refills | Status: DC

## 2019-03-08 MED ORDER — KETOROLAC TROMETHAMINE 60 MG/2ML IM SOLN
60.0000 mg | Freq: Once | INTRAMUSCULAR | Status: AC
  Administered 2019-03-08: 60 mg via INTRAMUSCULAR

## 2019-03-08 NOTE — Telephone Encounter (Signed)
Patient seen since phone call.  This encounter will now be closed 

## 2019-03-10 NOTE — Progress Notes (Signed)
BP 125/78   Pulse (!) 104   Temp (!) 97.5 F (36.4 C) (Temporal)   Ht 5\' 8"  (1.727 m)   Wt 177 lb 12.8 oz (80.6 kg)   SpO2 99%   BMI 27.03 kg/m    Subjective:    Patient ID: [name], female    DOB: May 22, 1961, 58 y.o.   MRN: [id number]  HPI: [name] is a 58 y.o. female presenting on 03/08/2019 for Knee Pain (and swelling- Right ) and Hip Pain (left)  This patient comes in because of she is having significant swelling of her right knee.  It is also creating the left hip pain because she is giving into that knee.  She states the pain is worse than anything she has had in a very long time.  She has been taking Motrin 800 mg 3 times a day for the past 2 weeks without any improvement.  She states that many years ago she did strain and hurt her knee but no new injury has gone on.  She has tried diligently to elevate leg for the last 3 days and use ice and there is no improvement.  The patient does have fibromyalgia and rheumatoid arthritis.  And she will be going to bring to rheumatology soon.  I do feel that she needs to see orthopedics soon as possible for this knee.   Past Medical History:  Diagnosis Date  . Arthritis    right hand  . Dyslipidemia   . Family history of anesthesia complication    patients mother has severe nausea and vomiting after  . Fibromyalgia   . GERD (gastroesophageal reflux disease)   . Guillain-Barre syndrome ([place])   . Hiatal hernia   . Kidney stones   . Migraines   . PONV (postoperative nausea and vomiting)   . TMJ (dislocation of temporomandibular joint)   . UTI (urinary tract infection)    hx of  . Vitamin D deficiency    Relevant past medical, surgical, family and social history reviewed and updated as indicated. Interim medical history since our last visit reviewed. Allergies and medications reviewed and updated. DATA REVIEWED: CHART IN EPIC  Family History reviewed for pertinent findings.  Review of Systems  Constitutional:  Negative.   HENT: Negative.   Eyes: Negative.   Respiratory: Negative.   Gastrointestinal: Negative.   Genitourinary: Negative.   Musculoskeletal: Positive for arthralgias, gait problem, joint swelling and myalgias.    Allergies as of 03/08/2019      Reactions   Other Anaphylaxis   Plastic bottles; patient drank from a water bottle and had to use Epi pen   Peanuts [peanut Oil] Anaphylaxis   Statins Other (See Comments)   Severe joint pain   Tape Other (See Comments)   Causes red blisters on skin. Can use paper tape      Medication List       Accurate as of March 08, 2019 11:59 PM. If you have any questions, ask your nurse or doctor.        cyclobenzaprine 10 MG tablet Commonly known as: FLEXERIL 1 po TID   EPINEPHrine 0.3 mg/0.3 mL Soaj injection Commonly known as: EPI-PEN Inject 0.3 mLs (0.3 mg total) into the muscle once.   furosemide 20 MG tablet Commonly known as: Lasix Take 1 tablet (20 mg total) by mouth daily.   HYDROcodone-acetaminophen 10-325 MG tablet Commonly known as: NORCO Take 1 tablet by mouth 3 (three) times daily.   HYDROcodone-acetaminophen  10-325 MG tablet Commonly known as: NORCO Take 1 tablet by mouth 3 (three) times daily. Start taking on: March 11, 2019   HYDROcodone-acetaminophen 10-325 MG tablet Commonly known as: NORCO Take 1 tablet by mouth 3 (three) times daily. Start taking on: April 10, 2019   hydrOXYzine 25 MG tablet Commonly known as: ATARAX/VISTARIL Take 0.5-1 tablets (12.5-25 mg total) by mouth every 8 (eight) hours as needed for anxiety.   loratadine 10 MG tablet Commonly known as: CLARITIN Take 10 mg by mouth daily.   meclizine 25 MG tablet Commonly known as: ANTIVERT Take 1 tablet (25 mg total) by mouth 3 (three) times daily as needed for dizziness.   meloxicam 15 MG tablet Commonly known as: MOBIC Take 1 tablet (15 mg total) by mouth daily. Started by: Terald Sleeper, PA-C   multivitamin tablet Take 1  tablet by mouth daily.   ondansetron 4 MG tablet Commonly known as: Zofran Take 1 tablet (4 mg total) by mouth every 8 (eight) hours as needed for nausea or vomiting.   pantoprazole 40 MG tablet Commonly known as: PROTONIX Take 1 tablet (40 mg total) by mouth 2 (two) times daily.   pravastatin 40 MG tablet Commonly known as: PRAVACHOL Take 1 tablet (40 mg total) by mouth daily.   Savella 50 MG Tabs tablet Generic drug: Milnacipran Take 1 tablet (50 mg total) by mouth 2 (two) times daily.   SUMAtriptan 100 MG tablet Commonly known as: IMITREX May repeat in 2 hours if headache persists or recurs.   valACYclovir 1000 MG tablet Commonly known as: VALTREX Take 1 tablet (1,000 mg total) by mouth daily.          Objective:    BP 125/78   Pulse (!) 104   Temp (!) 97.5 F (36.4 C) (Temporal)   Ht 5\' 8"  (1.727 m)   Wt 177 lb 12.8 oz (80.6 kg)   SpO2 99%   BMI 27.03 kg/m   Allergies  Allergen Reactions  . Other Anaphylaxis    Plastic bottles; patient drank from a water bottle and had to use Epi pen  . Peanuts [Peanut Oil] Anaphylaxis  . Statins Other (See Comments)    Severe joint pain  . Tape Other (See Comments)    Causes red blisters on skin. Can use paper tape    Wt Readings from Last 3 Encounters:  03/08/19 177 lb 12.8 oz (80.6 kg)  08/13/18 182 lb (82.6 kg)  07/20/18 180 lb (81.6 kg)    Physical Exam Constitutional:      General: She is not in acute distress.    Appearance: Normal appearance. She is well-developed.  HENT:     Head: Normocephalic and atraumatic.  Cardiovascular:     Rate and Rhythm: Normal rate.  Pulmonary:     Effort: Pulmonary effort is normal.  Musculoskeletal:     Right knee: She exhibits decreased range of motion, swelling, deformity and abnormal alignment. She exhibits no erythema. Tenderness found. Medial joint line and lateral joint line tenderness noted.  Skin:    General: Skin is warm and dry.     Findings: No rash.   Neurological:     Mental Status: She is alert and oriented to person, place, and time.     Deep Tendon Reflexes: Reflexes are normal and symmetric.         Assessment & Plan:   1. Chronic pain of right knee Worsening - ketorolac (TORADOL) injection 60 mg - Ambulatory referral to Orthopedic  Surgery  2. Swelling of knee Worsening - ketorolac (TORADOL) injection 60 mg - Ambulatory referral to Orthopedic Surgery   Continue all other maintenance medications as listed above.  Follow up plan: No follow-ups on file.  Educational handout given for [place] PA-C [place] [address]  [place], [place] [address] [phone]   03/10/2019, 1:39 PM

## 2019-04-07 ENCOUNTER — Other Ambulatory Visit: Payer: Self-pay | Admitting: Nurse Practitioner

## 2019-04-07 DIAGNOSIS — M797 Fibromyalgia: Secondary | ICD-10-CM

## 2019-04-07 NOTE — Progress Notes (Signed)
Ref to rheumatology - will try at another faciiity

## 2019-04-27 ENCOUNTER — Ambulatory Visit: Payer: 59 | Attending: Orthopedic Surgery | Admitting: Physical Therapy

## 2019-04-27 ENCOUNTER — Encounter: Payer: Self-pay | Admitting: Physical Therapy

## 2019-04-27 ENCOUNTER — Other Ambulatory Visit: Payer: Self-pay

## 2019-04-27 DIAGNOSIS — R262 Difficulty in walking, not elsewhere classified: Secondary | ICD-10-CM | POA: Insufficient documentation

## 2019-04-27 DIAGNOSIS — G8929 Other chronic pain: Secondary | ICD-10-CM

## 2019-04-27 DIAGNOSIS — M6281 Muscle weakness (generalized): Secondary | ICD-10-CM | POA: Diagnosis present

## 2019-04-27 DIAGNOSIS — M25561 Pain in right knee: Secondary | ICD-10-CM | POA: Insufficient documentation

## 2019-04-27 NOTE — Therapy (Signed)
[facility] [place], [place], [address] Phone: [phone]   Fax:  [phone]  Physical Therapy Evaluation  Patient Details  Name: [name] MRN: [id number] Date of Birth: December 30, 1960 Referring Provider (PT): Esmond Plants, MD   Encounter Date: 04/27/2019  PT End of Session - 04/27/19 1126    Visit Number  1    Number of Visits  8    Date for PT Re-Evaluation  06/01/19    Authorization Type  FOTO NEXT VISIT; Progress note every 10th visit    PT Start Time  0900    PT Stop Time  0946    PT Time Calculation (min)  46 min    Activity Tolerance  Patient tolerated treatment well;Patient limited by pain    Behavior During Therapy  [facility] for tasks assessed/performed       Past Medical History:  Diagnosis Date  . Arthritis    right hand  . Dyslipidemia   . Family history of anesthesia complication    patients mother has severe nausea and vomiting after  . Fibromyalgia   . GERD (gastroesophageal reflux disease)   . Guillain-Barre syndrome ([place])   . Hiatal hernia   . Kidney stones   . Migraines   . PONV (postoperative nausea and vomiting)   . TMJ (dislocation of temporomandibular joint)   . UTI (urinary tract infection)    hx of  . Vitamin D deficiency     Past Surgical History:  Procedure Laterality Date  . ABDOMINAL HYSTERECTOMY    . ADNOIDS    . ANTERIOR CERVICAL DECOMP/DISCECTOMY FUSION N/A 10/27/2013   Procedure: ACDF/ANTERIOR CERVICAL DECOMPRESSION/DISCECTOMY FUSION  C5-C7  (2 LEVELS);  Surgeon: Melina Schools, MD;  Location: [facility];  Service: Orthopedics;  Laterality: N/A;  . EYE SURGERY Bilateral    cataract removal    There were no vitals filed for this visit.   Subjective Assessment - 04/27/19 1009    Subjective  COVID-19 screening performed upon arrival.Patient arrives to physical therapy with reports of right knee pain that has progressively worsened since January 2020. Patient reports MRI found 2  stress fractures, one behind the kneecap and the other down the tibia, torn cartilage, and 2 cysts. Per MD report, she is to reduce weightbearing through right LE to allow healing of stress fractures before 05/19/2019 surgery to repair cartilage and meniscus. Patient reports ability to perform ADLs but with increased time and modifications. Patient reports sitting for lower body dressing. Patient utilizes a rollator to ambulate but has been trying to stay off her leg as much as possible. Pain at worst in right knee is reported as 10/10 and pain at best is 1-2/10. Patient's goals are to decrease pain, improve strength, improve mobility, improve ability to perform home and work activities, and return to PLOF.    Pertinent History  unremarkable    Limitations  Standing;Walking;House hold activities    How long can you stand comfortably?  short time, weight bear mainly on L LE    How long can you walk comfortably?  short distances, toe touch weightbearing    Diagnostic tests  MRI: 2 stress fractures (behind patella and in tibia), torn cartilage, 2 cysts    Patient Stated Goals  improve strength for surgery.    Currently in Pain?  Yes    Pain Score  4     Pain Location  Knee    Pain Orientation  Right    Pain Descriptors / Indicators  Throbbing    Pain Type  Chronic pain    Pain Onset  More than a month ago    Pain Frequency  Constant    Aggravating Factors   "activity"    Pain Relieving Factors  "rest & fibro medication PRN"    Effect of Pain on Daily Activities  "can't walk"         [facility] PT Assessment - 04/27/19 0001      Assessment   Medical Diagnosis  right knee pain    Referring Provider (PT)  Esmond Plants, MD    Onset Date/Surgical Date  --   January 2020   Next MD Visit  May 19, 2019    Prior Therapy  no      Restrictions   Weight Bearing Restrictions  Yes    RLE Weight Bearing  Touchdown weight bearing      Balance Screen   Has the patient fallen in the past 6 months  No     Has the patient had a decrease in activity level because of a fear of falling?   No    Is the patient reluctant to leave their home because of a fear of falling?   No      Home Film/video editor residence      Prior Function   Level of Independence  Independent with household mobility with device      ROM / Strength   AROM / PROM / Strength  AROM;Strength      AROM   AROM Assessment Site  Knee    Right/Left Knee  Right    Right Knee Extension  4    Right Knee Flexion  135      Strength   Overall Strength Comments  R knee grossly assessed due to stress fractures    Strength Assessment Site  Knee;Hip    Right/Left Hip  Right    Right Hip Extension  4+/5    Right Hip ABduction  4+/5      Palpation   Palpation comment  Very tender to palpation to R knee medial joint line, pes anserine region, and lateral joint line      Ambulation/Gait   Assistive device  Rollator    Gait Pattern  Step-through pattern;Decreased stance time - left;Decreased step length - left;Decreased step length - right;Decreased weight shift to right;Antalgic;Right flexed knee in stance   Toe touch weight bearing status   Ambulation Surface  Level;Indoor                Objective measurements completed on examination: See above findings.      [facility] Adult PT Treatment/Exercise - 04/27/19 0001      Modalities   Modalities  Electrical Stimulation;Cryotherapy      Cryotherapy   Number Minutes Cryotherapy  10 Minutes    Cryotherapy Location  Knee    Type of Cryotherapy  Ice pack      Electrical Stimulation   Electrical Stimulation Location  R knee    Electrical Stimulation Action  IFC    Electrical Stimulation Parameters  80-150 hz x10 mins    Electrical Stimulation Goals  Pain             PT Education - 04/27/19 1125    Education Details  Quad sets, hamstring sets, clam shells, SLR    Person(s) Educated  Patient    Methods   Explanation;Demonstration;Handout    Comprehension  Verbalized understanding;Returned demonstration  PT Long Term Goals - 04/27/19 1128      PT LONG TERM GOAL #1   Title  Patient will be independent with HEP    Time  4    Period  Weeks    Status  New      PT LONG TERM GOAL #2   Title  Patient will demonstrate 4/5 or greater right knee MMT in all planes to improve stabilty during functional tasks    Time  4    Period  Weeks    Status  New      PT LONG TERM GOAL #3   Title  Patient will report ability to perform ADLs and work activities with right knee pain less than or equal to 3/10.    Time  4    Period  Weeks    Status  New      PT LONG TERM GOAL #4   Title  Patient will report ability to walk community distances with right knee pain less than or equal to 3/10.    Time  4    Period  Weeks    Status  New      PT LONG TERM GOAL #5   Title  Patient will report ability to stand for 20 minutes or greater with equal weight bearing through bilateral LEs to improve showering and other standing tasks.    Time  4    Period  Weeks    Status  New             Plan - 04/27/19 1140    Clinical Impression Statement  Patient is a 58 year old female who presents to physical therapy with right knee pain and difficulty walking that began about January 2020. Patient's right knee MMT was grossly assessed secondary to stress fracture. Patient noted with good + right hip abduction and extension MMT. Patient ambulates with a rollator with a toe touch weight bearing status with decreased right weightbearing, and decreased stance time on right. Patient and PT discussed HEP, plan of care to address deficits, and safety with rollator and toe touch weight bearing status. Patient reported understanding. Patient would benefit from skilled physical therapy to address deficits and patient's goals.    Personal Factors and Comorbidities  Time since onset of injury/illness/exacerbation     Examination-Activity Limitations  Stand;Locomotion Level;Bathing;Hygiene/Grooming    Stability/Clinical Decision Making  Stable/Uncomplicated    Clinical Decision Making  Low    Rehab Potential  Good    PT Frequency  2x / week    PT Duration  4 weeks    PT Treatment/Interventions  ADLs/Self Care Home Management;Electrical Stimulation;Cryotherapy;Ultrasound;Balance training;Therapeutic exercise;Therapeutic activities;Functional mobility training;Stair training;Gait training;Neuromuscular re-education;Patient/family education;Passive range of motion;Manual techniques;Vasopneumatic Device;Taping    PT Next Visit Plan  FOTO please; Nustep, gentle strengthening of right knee and hip e-stim and vaso for pain relief    PT Home Exercise Plan  see patient education section    Consulted and Agree with Plan of Care  Patient       Patient will benefit from skilled therapeutic intervention in order to improve the following deficits and impairments:  Decreased activity tolerance, Decreased strength, Decreased balance, Decreased range of motion, Difficulty walking, Pain  Visit Diagnosis: Chronic pain of right knee  Difficulty in walking, not elsewhere classified  Muscle weakness (generalized)     Problem List Patient Active Problem List   Diagnosis Date Noted  . Pain management contract agreement 05/16/2016  . Insomnia 04/13/2015  .  BMI 26.0-26.9,adult 04/13/2015  . GAD (generalized anxiety disorder) 05/25/2014  . Hyperlipidemia 11/04/2012  . Migraines 11/04/2012  . GERD (gastroesophageal reflux disease) 11/04/2012  . Fibromyalgia 11/04/2012  . TMJ arthropathy 11/04/2012    Gabriela Eves, PT, DPT 04/27/2019, 11:45 AM  [facility] [address] [place], [place], [address] Phone: [phone]   Fax:  [phone]  Name: ABY BESSINGER MRN: [id number] Date of Birth: 1961-03-30

## 2019-04-29 ENCOUNTER — Ambulatory Visit: Payer: 59 | Admitting: Physical Therapy

## 2019-04-29 ENCOUNTER — Encounter: Payer: Self-pay | Admitting: Physical Therapy

## 2019-04-29 ENCOUNTER — Other Ambulatory Visit: Payer: Self-pay

## 2019-04-29 DIAGNOSIS — M25561 Pain in right knee: Secondary | ICD-10-CM | POA: Diagnosis not present

## 2019-04-29 DIAGNOSIS — R262 Difficulty in walking, not elsewhere classified: Secondary | ICD-10-CM

## 2019-04-29 DIAGNOSIS — G8929 Other chronic pain: Secondary | ICD-10-CM

## 2019-04-29 DIAGNOSIS — M6281 Muscle weakness (generalized): Secondary | ICD-10-CM

## 2019-04-29 NOTE — Therapy (Signed)
[facility] [place], [place], [address] Phone: [phone]   Fax:  [phone]  Physical Therapy Treatment  Patient Details  Name: [name] MRN: [id number] Date of Birth: July 30, 1960 Referring Provider (PT): Esmond Plants, MD   Encounter Date: 04/29/2019  PT End of Session - 04/29/19 1625    Visit Number  2    Number of Visits  8    Date for PT Re-Evaluation  06/01/19    Authorization Type  Progress note every 10th visit    PT Start Time  1515    PT Stop Time  1601    PT Time Calculation (min)  46 min    Activity Tolerance  Patient tolerated treatment well;Patient limited by pain    Behavior During Therapy  [facility] for tasks assessed/performed       Past Medical History:  Diagnosis Date  . Arthritis    right hand  . Dyslipidemia   . Family history of anesthesia complication    patients mother has severe nausea and vomiting after  . Fibromyalgia   . GERD (gastroesophageal reflux disease)   . Guillain-Barre syndrome ([place])   . Hiatal hernia   . Kidney stones   . Migraines   . PONV (postoperative nausea and vomiting)   . TMJ (dislocation of temporomandibular joint)   . UTI (urinary tract infection)    hx of  . Vitamin D deficiency     Past Surgical History:  Procedure Laterality Date  . ABDOMINAL HYSTERECTOMY    . ADNOIDS    . ANTERIOR CERVICAL DECOMP/DISCECTOMY FUSION N/A 10/27/2013   Procedure: ACDF/ANTERIOR CERVICAL DECOMPRESSION/DISCECTOMY FUSION  C5-C7  (2 LEVELS);  Surgeon: Melina Schools, MD;  Location: [facility];  Service: Orthopedics;  Laterality: N/A;  . EYE SURGERY Bilateral    cataract removal    There were no vitals filed for this visit.  Subjective Assessment - 04/29/19 1623    Subjective  COVID-19 screening performed upon arrival. Patient arrives to physical therapy with pain that gets her sick to her stomach.    Pertinent History  unremarkable    Limitations  Standing;Walking;House hold  activities    How long can you stand comfortably?  short time, weight bear mainly on L LE    How long can you walk comfortably?  short distances, toe touch weightbearing    Diagnostic tests  MRI: 2 stress fractures (behind patella and in tibia), torn cartilage, 2 cysts    Patient Stated Goals  improve strength for surgery.    Currently in Pain?  Yes   did not provide number on pain scale        [facility] PT Assessment - 04/29/19 0001      Assessment   Medical Diagnosis  right knee pain    Referring Provider (PT)  Esmond Plants, MD    Next MD Visit  May 19, 2019    Prior Therapy  no                   [facility] Adult PT Treatment/Exercise - 04/29/19 0001      Exercises   Exercises  Knee/Hip      Knee/Hip Exercises: Aerobic   Nustep  Level 2 x3 minutes; terminated due to burning pain      Modalities   Modalities  Electrical Stimulation;Vasopneumatic;Ultrasound      Acupuncturist Location  R knee    Electrical Stimulation Action  IFC    Electrical  Stimulation Parameters  80-150 hz x 15 mins    Electrical Stimulation Goals  Pain      Ultrasound   Ultrasound Location  right lateral knee    Ultrasound Parameters  50%, 3.3 mhz 1.5 w/cm2 x12 minutes    Ultrasound Goals  Pain;Edema      Vasopneumatic   Number Minutes Vasopneumatic   15 minutes    Vasopnuematic Location   Knee    Vasopneumatic Pressure  Low    Vasopneumatic Temperature   56                  PT Long Term Goals - 04/27/19 1128      PT LONG TERM GOAL #1   Title  Patient will be independent with HEP    Time  4    Period  Weeks    Status  New      PT LONG TERM GOAL #2   Title  Patient will demonstrate 4/5 or greater right knee MMT in all planes to improve stabilty during functional tasks    Time  4    Period  Weeks    Status  New      PT LONG TERM GOAL #3   Title  Patient will report ability to perform ADLs and work activities with right knee pain  less than or equal to 3/10.    Time  4    Period  Weeks    Status  New      PT LONG TERM GOAL #4   Title  Patient will report ability to walk community distances with right knee pain less than or equal to 3/10.    Time  4    Period  Weeks    Status  New      PT LONG TERM GOAL #5   Title  Patient will report ability to stand for 20 minutes or greater with equal weight bearing through bilateral LEs to improve showering and other standing tasks.    Time  4    Period  Weeks    Status  New            Plan - 04/29/19 1626    Clinical Impression Statement  Patient arrives with increase of pain in right knee. Patient attempted nustep but began to report burning pain in lateral aspect of the knee therefore exercise was terminated.  Pulsed [place] treatment performed to right lateral knee and lateral joint line to which patient reported tenderness at last few minutes of treatment. Patient instructed to elevate leg while working but also take some time to stand throughout her work day. Patient reported understanding. No adverse affects noted upon removal of modalities.    Personal Factors and Comorbidities  Time since onset of injury/illness/exacerbation    Examination-Activity Limitations  Stand;Locomotion Level;Bathing;Hygiene/Grooming    Stability/Clinical Decision Making  Stable/Uncomplicated    Clinical Decision Making  Low    Rehab Potential  Good    PT Frequency  2x / week    PT Duration  4 weeks    PT Treatment/Interventions  ADLs/Self Care Home Management;Electrical Stimulation;Cryotherapy;Ultrasound;Balance training;Therapeutic exercise;Therapeutic activities;Functional mobility training;Stair training;Gait training;Neuromuscular re-education;Patient/family education;Passive range of motion;Manual techniques;Vasopneumatic Device;Taping    PT Next Visit Plan  Nustep, gentle strengthening of right knee and hip e-stim and vaso for pain relief    PT Home Exercise Plan  see patient education  section    Consulted and Agree with Plan of Care  Patient  Patient will benefit from skilled therapeutic intervention in order to improve the following deficits and impairments:  Decreased activity tolerance, Decreased strength, Decreased balance, Decreased range of motion, Difficulty walking, Pain  Visit Diagnosis: Chronic pain of right knee  Difficulty in walking, not elsewhere classified  Muscle weakness (generalized)     Problem List Patient Active Problem List   Diagnosis Date Noted  . Pain management contract agreement 05/16/2016  . Insomnia 04/13/2015  . BMI 26.0-26.9,adult 04/13/2015  . GAD (generalized anxiety disorder) 05/25/2014  . Hyperlipidemia 11/04/2012  . Migraines 11/04/2012  . GERD (gastroesophageal reflux disease) 11/04/2012  . Fibromyalgia 11/04/2012  . TMJ arthropathy 11/04/2012    Gabriela Eves, PT, DPT 04/29/2019, 4:44 PM  [facility] [address] [place], [place], [address] Phone: [phone]   Fax:  [phone]  Name: [name] MRN: [id number] Date of Birth: 1961-05-27

## 2019-04-30 ENCOUNTER — Encounter: Payer: 59 | Admitting: Physical Therapy

## 2019-05-04 ENCOUNTER — Other Ambulatory Visit: Payer: Self-pay

## 2019-05-04 ENCOUNTER — Ambulatory Visit: Payer: 59 | Admitting: Physical Therapy

## 2019-05-04 ENCOUNTER — Encounter: Payer: Self-pay | Admitting: Physical Therapy

## 2019-05-04 DIAGNOSIS — M25561 Pain in right knee: Secondary | ICD-10-CM

## 2019-05-04 DIAGNOSIS — M6281 Muscle weakness (generalized): Secondary | ICD-10-CM

## 2019-05-04 DIAGNOSIS — G8929 Other chronic pain: Secondary | ICD-10-CM

## 2019-05-04 DIAGNOSIS — R262 Difficulty in walking, not elsewhere classified: Secondary | ICD-10-CM

## 2019-05-04 NOTE — Therapy (Signed)
[facility] [place], [place], [address] Phone: [phone]   Fax:  [phone]  Physical Therapy Treatment  Patient Details  Name: [name] MRN: [id number] Date of Birth: [birth year] Referring Provider (PT): Esmond Plants, MD   Encounter Date: 05/04/2019  PT End of Session - 05/04/19 1237    Visit Number  3    Number of Visits  8    Date for PT Re-Evaluation  06/01/19    Authorization Type  Progress note every 10th visit    PT Start Time  0900    PT Stop Time  0948    PT Time Calculation (min)  48 min    Activity Tolerance  Patient tolerated treatment well;Patient limited by pain    Behavior During Therapy  [facility] for tasks assessed/performed       Past Medical History:  Diagnosis Date  . Arthritis    right hand  . Dyslipidemia   . Family history of anesthesia complication    patients mother has severe nausea and vomiting after  . Fibromyalgia   . GERD (gastroesophageal reflux disease)   . Guillain-Barre syndrome ([place])   . Hiatal hernia   . Kidney stones   . Migraines   . PONV (postoperative nausea and vomiting)   . TMJ (dislocation of temporomandibular joint)   . UTI (urinary tract infection)    hx of  . Vitamin D deficiency     Past Surgical History:  Procedure Laterality Date  . ABDOMINAL HYSTERECTOMY    . ADNOIDS    . ANTERIOR CERVICAL DECOMP/DISCECTOMY FUSION N/A 10/27/2013   Procedure: ACDF/ANTERIOR CERVICAL DECOMPRESSION/DISCECTOMY FUSION  C5-C7  (2 LEVELS);  Surgeon: Melina Schools, MD;  Location: [place];  Service: Orthopedics;  Laterality: N/A;  . EYE SURGERY Bilateral    cataract removal    There were no vitals filed for this visit.  Subjective Assessment - 05/04/19 1229    Subjective  COVID-19 screening performed upon arrival. Patient stating "it just hurts". Patient also arrives utilizing a walking stick instead of the rollator    Pertinent History  unremarkable    Limitations   Standing;Walking;House hold activities    How long can you stand comfortably?  short time, weight bear mainly on L LE    How long can you walk comfortably?  short distances, toe touch weightbearing    Diagnostic tests  MRI: 2 stress fractures (behind patella and in tibia), torn cartilage, 2 cysts    Patient Stated Goals  improve strength for surgery.    Currently in Pain?  Yes   did not provide number on pain scale        [facility] PT Assessment - 05/04/19 0001      Assessment   Medical Diagnosis  right knee pain    Referring Provider (PT)  Esmond Plants, MD    Next MD Visit  May 19, 2019    Prior Therapy  no                   [facility] Adult PT Treatment/Exercise - 05/04/19 0001      Exercises   Exercises  Knee/Hip      Knee/Hip Exercises: Seated   Long Arc Quad  AROM;Right;3 sets;10 reps    Long Arc Quad Limitations  with ball squeeze    Hamstring Curl  Right;3 sets;10 reps    Hamstring Limitations  yellow theraband      Knee/Hip Exercises: Supine   Short Arc [employer]  Sets  Strengthening;Other (comment)    Short Arc Quad Sets Limitations  with VMS for neuromuscular re-ed, 10/10 280 pps x10 mins    Bridges  2 sets;10 reps;AROM      Modalities   Modalities  Psychologist, educational Location  R knee    Electrical Stimulation Action  IFC    Electrical Stimulation Parameters  80-150 hz x15 mins    Electrical Stimulation Goals  Pain      Vasopneumatic   Number Minutes Vasopneumatic   15 minutes    Vasopnuematic Location   Knee    Vasopneumatic Pressure  Medium    Vasopneumatic Temperature   34                  PT Long Term Goals - 04/27/19 1128      PT LONG TERM GOAL #1   Title  Patient will be independent with HEP    Time  4    Period  Weeks    Status  New      PT LONG TERM GOAL #2   Title  Patient will demonstrate 4/5 or greater right knee MMT in all planes to improve stabilty  during functional tasks    Time  4    Period  Weeks    Status  New      PT LONG TERM GOAL #3   Title  Patient will report ability to perform ADLs and work activities with right knee pain less than or equal to 3/10.    Time  4    Period  Weeks    Status  New      PT LONG TERM GOAL #4   Title  Patient will report ability to walk community distances with right knee pain less than or equal to 3/10.    Time  4    Period  Weeks    Status  New      PT LONG TERM GOAL #5   Title  Patient will report ability to stand for 20 minutes or greater with equal weight bearing through bilateral LEs to improve showering and other standing tasks.    Time  4    Period  Weeks    Status  New            Plan - 05/04/19 1238    Clinical Impression Statement  Patient was able to tolerate treatment fairly well but with reports of increased pain behind the knee with last minute of VMS. Patient noted with slight extension lag with VMS and SAQ. Patient and Pt discussed basic anatomy of  knee and discussed plan of care post surgery. Patient reported understanding. Patient responded well to modalities with no adverse affects.    Personal Factors and Comorbidities  Time since onset of injury/illness/exacerbation    Examination-Activity Limitations  Stand;Locomotion Level;Bathing;Hygiene/Grooming    Stability/Clinical Decision Making  Stable/Uncomplicated    Clinical Decision Making  Low    Rehab Potential  Good    PT Frequency  2x / week    PT Duration  4 weeks    PT Treatment/Interventions  ADLs/Self Care Home Management;Electrical Stimulation;Cryotherapy;Ultrasound;Balance training;Therapeutic exercise;Therapeutic activities;Functional mobility training;Stair training;Gait training;Neuromuscular re-education;Patient/family education;Passive range of motion;Manual techniques;Vasopneumatic Device;Taping    PT Next Visit Plan  Nustep, gentle strengthening of right knee and hip e-stim and vaso for pain relief     PT Home Exercise Plan  see patient education section  Consulted and Agree with Plan of Care  Patient       Patient will benefit from skilled therapeutic intervention in order to improve the following deficits and impairments:  Decreased activity tolerance, Decreased strength, Decreased balance, Decreased range of motion, Difficulty walking, Pain  Visit Diagnosis: Chronic pain of right knee  Difficulty in walking, not elsewhere classified  Muscle weakness (generalized)     Problem List Patient Active Problem List   Diagnosis Date Noted  . Pain management contract agreement 05/16/2016  . Insomnia 04/13/2015  . BMI 26.0-26.9,adult 04/13/2015  . GAD (generalized anxiety disorder) 05/25/2014  . Hyperlipidemia 11/04/2012  . Migraines 11/04/2012  . GERD (gastroesophageal reflux disease) 11/04/2012  . Fibromyalgia 11/04/2012  . TMJ arthropathy 11/04/2012    Gabriela Eves, PT, DPT 05/04/2019, 12:44 PM  [facility] [address] [place], [place], [address] Phone: [phone]   Fax:  [phone]  Name: [name] MRN: [id number] Date of Birth: [birth year]

## 2019-05-10 ENCOUNTER — Other Ambulatory Visit: Payer: Self-pay

## 2019-05-11 ENCOUNTER — Encounter: Payer: Self-pay | Admitting: Nurse Practitioner

## 2019-05-11 ENCOUNTER — Ambulatory Visit: Payer: 59 | Attending: Orthopedic Surgery | Admitting: Physical Therapy

## 2019-05-11 ENCOUNTER — Other Ambulatory Visit: Payer: Self-pay

## 2019-05-11 ENCOUNTER — Ambulatory Visit: Payer: 59 | Admitting: Nurse Practitioner

## 2019-05-11 VITALS — BP 138/83 | HR 109 | Temp 99.1°F | Resp 20 | Ht 68.0 in | Wt 187.0 lb

## 2019-05-11 DIAGNOSIS — R262 Difficulty in walking, not elsewhere classified: Secondary | ICD-10-CM | POA: Diagnosis present

## 2019-05-11 DIAGNOSIS — E7800 Pure hypercholesterolemia, unspecified: Secondary | ICD-10-CM

## 2019-05-11 DIAGNOSIS — K219 Gastro-esophageal reflux disease without esophagitis: Secondary | ICD-10-CM

## 2019-05-11 DIAGNOSIS — M6281 Muscle weakness (generalized): Secondary | ICD-10-CM | POA: Insufficient documentation

## 2019-05-11 DIAGNOSIS — M25561 Pain in right knee: Secondary | ICD-10-CM | POA: Insufficient documentation

## 2019-05-11 DIAGNOSIS — G8929 Other chronic pain: Secondary | ICD-10-CM | POA: Insufficient documentation

## 2019-05-11 DIAGNOSIS — F411 Generalized anxiety disorder: Secondary | ICD-10-CM

## 2019-05-11 DIAGNOSIS — Z6826 Body mass index (BMI) 26.0-26.9, adult: Secondary | ICD-10-CM

## 2019-05-11 DIAGNOSIS — M797 Fibromyalgia: Secondary | ICD-10-CM | POA: Diagnosis not present

## 2019-05-11 MED ORDER — HYDROCODONE-ACETAMINOPHEN 10-325 MG PO TABS: 1.0000 | ORAL_TABLET | Freq: Three times a day (TID) | ORAL | 0 refills | Status: DC

## 2019-05-11 MED ORDER — PANTOPRAZOLE SODIUM 40 MG PO TBEC: 40.0000 mg | DELAYED_RELEASE_TABLET | Freq: Two times a day (BID) | ORAL | 1 refills | Status: DC

## 2019-05-11 MED ORDER — SAVELLA 50 MG PO TABS: 50.0000 mg | ORAL_TABLET | Freq: Two times a day (BID) | ORAL | 1 refills | Status: DC

## 2019-05-11 MED ORDER — PRAVASTATIN SODIUM 40 MG PO TABS: 40.0000 mg | ORAL_TABLET | Freq: Every day | ORAL | 1 refills | Status: DC

## 2019-05-11 MED ORDER — FUROSEMIDE 20 MG PO TABS: 20.0000 mg | ORAL_TABLET | Freq: Every day | ORAL | 1 refills | Status: DC

## 2019-05-11 MED ORDER — KETOROLAC TROMETHAMINE 60 MG/2ML IM SOLN
60.0000 mg | Freq: Once | INTRAMUSCULAR | Status: AC
  Administered 2019-05-11: 60 mg via INTRAMUSCULAR

## 2019-05-11 NOTE — Patient Instructions (Signed)
Ketorolac injection What is this medicine? KETOROLAC (kee toe ROLE ak) is a non-steroidal anti-inflammatory drug (NSAID). It is used to treat moderate to severe pain for up to 5 days. It is commonly used after surgery. This medicine should not be used for more than 5 days. This medicine may be used for other purposes; ask your health care provider or pharmacist if you have questions. COMMON BRAND NAME(S): Toradol What should I tell my health care provider before I take this medicine? They need to know if you have any of these conditions:  asthma, especially aspirin-sensitive asthma  bleeding problems  coronary artery bypass graft (CABG) surgery within the past 2 weeks  kidney disease  stomach bleed, ulcer, or other problem  taking aspirin, other NSAID, or probenecid  an unusual or allergic reaction to ketorolac, tromethamine, aspirin, other NSAIDs, other medicines, foods, dyes or preservatives  pregnant or trying to get pregnant  breast-feeding How should I use this medicine? This medicine is for injection into a muscle or into a vein. It is given by a health care professional in a hospital or clinic setting. Talk to your pediatrician regarding the use of this medicine in children. Special care may be needed. Patients over 16 years old may have a stronger reaction and need a smaller dose. Overdosage: If you think you have taken too much of this medicine contact a poison control center or emergency room at once. NOTE: This medicine is only for you. Do not share this medicine with others. What if I miss a dose? This does not apply. What may interact with this medicine? Do not take this medicine with any of the following medications:  aspirin and aspirin-like medicines  cidofovir  methotrexate  NSAIDs, medicines for pain and inflammation, like ibuprofen or naproxen  pentoxifylline  probenecid This medicine may also interact with the following  medications:  alcohol  alendronate  alprazolam  carbamazepine  diuretics  flavocoxid  fluoxetine  ginkgo  lithium  medicines for blood pressure like enalapril  medicines that affect platelets like pentoxifylline  medicines that treat or prevent blood clots like heparin, warfarin  muscle relaxants  pemetrexed  phenytoin  thiothixene This list may not describe all possible interactions. Give your health care provider a list of all the medicines, herbs, non-prescription drugs, or dietary supplements you use. Also tell them if you smoke, drink alcohol, or use illegal drugs. Some items may interact with your medicine. What should I watch for while using this medicine? Tell your doctor or healthcare provider if your symptoms do not start to get better or if they get worse. This medicine may cause serious skin reactions. They can happen weeks to months after starting the medicine. Contact your healthcare provider right away if you notice fevers or flu-like symptoms with a rash. The rash may be red or purple and then turn into blisters or peeling of the skin. Or, you might notice a red rash with swelling of the face, lips or lymph nodes in your neck or under your arms. This medicine does not prevent heart attack or stroke. In fact, this medicine may increase the chance of a heart attack or stroke. The chance may increase with longer use of this medicine and in people who have heart disease. If you take aspirin to prevent heart attack or stroke, talk with your doctor or healthcare provider. Do not take medicines such as ibuprofen and naproxen with this medicine. Side effects such as stomach upset, nausea, or ulcers may be  more likely to occur. Many medicines available without a prescription should not be taken with this medicine. This medicine can cause ulcers and bleeding in the stomach and intestines at any time during treatment. Do not smoke cigarettes or drink alcohol. These  increase irritation to your stomach and can make it more susceptible to damage from this medicine. Ulcers and bleeding can happen without warning symptoms and can cause death. This medicine can cause you to bleed more easily. Try to avoid damage to your teeth and gums when you brush or floss your teeth. What side effects may I notice from receiving this medicine? Side effects that you should report to your doctor or health care professional as soon as possible:  allergic reactions like skin rash, itching or hives, swelling of the face, lips, or tongue  breathing problems  high blood pressure  nausea, vomiting  redness, blistering, peeling or loosening of the skin, including inside the mouth  severe stomach pain  signs and symptoms of bleeding such as bloody or black, tarry stools; red or dark-brown urine; spitting up blood or brown material that looks like coffee grounds; red spots on the skin; unusual bruising or bleeding from the eye, gums, or nose  signs and symptoms of a blood clot changes in vision; chest pain; severe, sudden headache; trouble speaking; sudden numbness or weakness of the face, arm, or leg  trouble passing urine or change in the amount of urine  unexplained weight gain or swelling  unusually weak or tired  yellowing of eyes or skin Side effects that usually do not require medical attention (report to your doctor or health care professional if they continue or are bothersome):  diarrhea  dizziness  headache  heartburn This list may not describe all possible side effects. Call your doctor for medical advice about side effects. You may report side effects to FDA at 1-800-FDA-1088. Where should I keep my medicine? This drug is given in a hospital or clinic and will not be stored at home. NOTE: This sheet is a summary. It may not cover all possible information. If you have questions about this medicine, talk to your doctor, pharmacist, or health care  provider.  2020 Elsevier/Gold Standard (2018-08-12 15:10:53)

## 2019-05-11 NOTE — Progress Notes (Signed)
Subjective:    Patient ID: [name], female    DOB: 06-18-1960, 58 y.o.   MRN: [id number]   Chief Complaint: Medical Management of Chronic Issues    HPI:  1. Pure hypercholesterolemia Tries to watch diet and stay active. Lab Results  Component Value Date   CHOL 120 10/02/2017   HDL 43 10/02/2017   LDLCALC 58 10/02/2017   TRIG 96 10/02/2017   CHOLHDL 2.8 10/02/2017     2. GAD (generalized anxiety disorder) Is doing well Is currently on no anxiety meds. GAD 7 : Generalized Anxiety Score 05/11/2019 09/16/2018 08/25/2015  Nervous, Anxious, on Edge 0 3 3  Control/stop worrying 0 3 3  Worry too much - different things 0 3 3  Trouble relaxing 0 3 3  Restless 0 0 0  Easily annoyed or irritable 0 3 3  Afraid - awful might happen 0 0 0  Total GAD 7 Score 0 15 15  Anxiety Difficulty Not difficult at all Not difficult at all -      3. Fibromyalgia Pain assessment: Cause of pain- fibromyalgia Pain location- varying places Pain on scale of 1-10- 7/10 Frequency- daily What increases pain-everything What makes pain Better-pain meds help Effects on ADL - none Any change in general medical condition-none  Current opioids rx- norco 10/325 TID # meds rx- 90 Effectiveness of current meds-helps bring pain down to 2-3/10 Adverse reactions form pain meds-none Morphine equivalent- 30 MEDD  Pill count performed-No Last drug screen - 08/13/18 ( high risk q17m, moderate risk q34m, low risk yearly ) Urine drug screen today- No Was the [facility] reviewed- yes  If yes were their any concerning findings? - none    Pain contract signed on: 08/17/18   4. Gastroesophageal reflux disease, unspecified whether esophagitis present Is on protonix daily and is doing well  5. BMI 26.0-26.9,adult No recent weight changes Wt Readings from Last 3 Encounters:  05/11/19 187 lb (84.8 kg)  03/08/19 177 lb 12.8 oz (80.6 kg)  08/13/18 182 lb (82.6 kg)   BMI Readings from Last 3 Encounters:   05/11/19 28.43 kg/m  03/08/19 27.03 kg/m  08/13/18 27.67 kg/m       Outpatient Encounter Medications as of 05/11/2019  Medication Sig  . cyclobenzaprine (FLEXERIL) 10 MG tablet 1 po TID  . EPINEPHrine 0.3 mg/0.3 mL IJ SOAJ injection Inject 0.3 mLs (0.3 mg total) into the muscle once.  . furosemide (LASIX) 20 MG tablet Take 1 tablet (20 mg total) by mouth daily.  Marland Kitchen HYDROcodone-acetaminophen (NORCO) 10-325 MG tablet Take 1 tablet by mouth 3 (three) times daily.  Marland Kitchen HYDROcodone-acetaminophen (NORCO) 10-325 MG tablet Take 1 tablet by mouth 3 (three) times daily.  Marland Kitchen HYDROcodone-acetaminophen (NORCO) 10-325 MG tablet Take 1 tablet by mouth 3 (three) times daily.  . hydrOXYzine (ATARAX/VISTARIL) 25 MG tablet Take 0.5-1 tablets (12.5-25 mg total) by mouth every 8 (eight) hours as needed for anxiety.  Marland Kitchen loratadine (CLARITIN) 10 MG tablet Take 10 mg by mouth daily.  . meclizine (ANTIVERT) 25 MG tablet Take 1 tablet (25 mg total) by mouth 3 (three) times daily as needed for dizziness.  . meloxicam (MOBIC) 15 MG tablet Take 1 tablet (15 mg total) by mouth daily.  . Milnacipran (SAVELLA) 50 MG TABS tablet Take 1 tablet (50 mg total) by mouth 2 (two) times daily.  . Multiple Vitamin (MULTIVITAMIN) tablet Take 1 tablet by mouth daily.  . ondansetron (ZOFRAN) 4 MG tablet Take 1 tablet (4 mg total) by  mouth every 8 (eight) hours as needed for nausea or vomiting.  . pantoprazole (PROTONIX) 40 MG tablet Take 1 tablet (40 mg total) by mouth 2 (two) times daily.  . pravastatin (PRAVACHOL) 40 MG tablet Take 1 tablet (40 mg total) by mouth daily.  . SUMAtriptan (IMITREX) 100 MG tablet May repeat in 2 hours if headache persists or recurs.  . valACYclovir (VALTREX) 1000 MG tablet Take 1 tablet (1,000 mg total) by mouth daily.     Past Surgical History:  Procedure Laterality Date  . ABDOMINAL HYSTERECTOMY    . ADNOIDS    . ANTERIOR CERVICAL DECOMP/DISCECTOMY FUSION N/A 10/27/2013   Procedure: ACDF/ANTERIOR  CERVICAL DECOMPRESSION/DISCECTOMY FUSION  C5-C7  (2 LEVELS);  Surgeon: Melina Schools, MD;  Location: [place];  Service: Orthopedics;  Laterality: N/A;  . EYE SURGERY Bilateral    cataract removal    Family History  Problem Relation Age of Onset  . Heart disease Mother   . Cancer Father        STOMACH CANCER  . Cancer Maternal Grandfather   . Diabetes Paternal Grandmother     New complaints: Is having right knee arthroscopic knee surgery next week  Social history: Lives by herself. Has a new grandchild     Review of Systems  Constitutional: Negative for activity change and appetite change.  HENT: Negative.   Eyes: Negative for pain.  Respiratory: Negative for shortness of breath.   Cardiovascular: Negative for chest pain, palpitations and leg swelling.  Gastrointestinal: Negative for abdominal pain.  Endocrine: Negative for polydipsia.  Genitourinary: Negative.   Musculoskeletal: Positive for arthralgias (right knee).  Skin: Negative for rash.  Neurological: Negative for dizziness, weakness and headaches.  Hematological: Does not bruise/bleed easily.  Psychiatric/Behavioral: Negative.   All other systems reviewed and are negative.      Objective:   Physical Exam Vitals signs and nursing note reviewed.  Constitutional:      General: She is not in acute distress.    Appearance: Normal appearance. She is well-developed.  HENT:     Head: Normocephalic.     Nose: Nose normal.  Eyes:     Pupils: Pupils are equal, round, and reactive to light.  Neck:     Musculoskeletal: Normal range of motion and neck supple.     Vascular: No carotid bruit or JVD.  Cardiovascular:     Rate and Rhythm: Normal rate and regular rhythm.     Heart sounds: Normal heart sounds.  Pulmonary:     Effort: Pulmonary effort is normal. No respiratory distress.     Breath sounds: Normal breath sounds. No wheezing or rales.  Chest:     Chest wall: No tenderness.  Abdominal:     General: Bowel  sounds are normal. There is no distension or abdominal bruit.     Palpations: Abdomen is soft. There is no hepatomegaly, splenomegaly, mass or pulsatile mass.     Tenderness: There is no abdominal tenderness.  Musculoskeletal: Normal range of motion.     Comments: Right knee effusion with pain on flexion and extension Walking with a walking stick  Lymphadenopathy:     Cervical: No cervical adenopathy.  Skin:    General: Skin is warm and dry.  Neurological:     Mental Status: She is alert and oriented to person, place, and time.     Deep Tendon Reflexes: Reflexes are normal and symmetric.  Psychiatric:        Behavior: Behavior normal.  Thought Content: Thought content normal.        Judgment: Judgment normal.    BP 138/83   Pulse (!) 109   Temp 99.1 F (37.3 C) (Temporal)   Resp 20   Ht 5\' 8"  (1.727 m)   Wt 187 lb (84.8 kg)   SpO2 99%   BMI 28.43 kg/m        Assessment & Plan:  SHARMA MATHURIN comes in today with chief complaint of Medical Management of Chronic Issues   Diagnosis and orders addressed:  1. Pure hypercholesterolemia Low fat diet - pravastatin (PRAVACHOL) 40 MG tablet; Take 1 tablet (40 mg total) by mouth daily.  Dispense: 90 tablet; Refill: 1  2. GAD (generalized anxiety disorder) Stress management  3. Fibromyalgia Keep muscle warm - HYDROcodone-acetaminophen (NORCO) 10-325 MG tablet; Take 1 tablet by mouth 3 (three) times daily.  Dispense: 90 tablet; Refill: 0 - HYDROcodone-acetaminophen (NORCO) 10-325 MG tablet; Take 1 tablet by mouth 3 (three) times daily.  Dispense: 90 tablet; Refill: 0 - HYDROcodone-acetaminophen (NORCO) 10-325 MG tablet; Take 1 tablet by mouth 3 (three) times daily.  Dispense: 90 tablet; Refill: 0 - Milnacipran (SAVELLA) 50 MG TABS tablet; Take 1 tablet (50 mg total) by mouth 2 (two) times daily.  Dispense: 180 tablet; Refill: 1 - ketorolac (TORADOL) injection 60 mg  4. Gastroesophageal reflux disease, unspecified  whether esophagitis present Avoid spicy foods Do not eat 2 hours prior to bedtime Avoid spicy foods Do not eat 2 hours prior to bedtime - pantoprazole (PROTONIX) 40 MG tablet; Take 1 tablet (40 mg total) by mouth 2 (two) times daily.  Dispense: 180 tablet; Refill: 1   5. BMI 26.0-26.9,adult Discussed diet and exercise for person with BMI >25 Will recheck weight in 3-6 months    Labs pending Health Maintenance reviewed Diet and exercise encouraged  Follow up plan: 3 months   Mary-Margaret Hassell Done, FNP

## 2019-05-11 NOTE — Therapy (Signed)
[facility] [place], [place], [address] Phone: [phone]   Fax:  [phone]  Physical Therapy Treatment  Patient Details  Name: [name] MRN: [id number] Date of Birth: 1960/11/30 Referring Provider (PT): Esmond Plants, MD   Encounter Date: 05/11/2019  PT End of Session - 05/11/19 0942    Visit Number  4    Number of Visits  8    Date for PT Re-Evaluation  06/01/19    Authorization Type  Progress note every 10th visit    PT Start Time  0900    PT Stop Time  0946    PT Time Calculation (min)  46 min    Activity Tolerance  Patient tolerated treatment well;Patient limited by pain    Behavior During Therapy  [facility] for tasks assessed/performed       Past Medical History:  Diagnosis Date  . Arthritis    right hand  . Dyslipidemia   . Family history of anesthesia complication    patients mother has severe nausea and vomiting after  . Fibromyalgia   . GERD (gastroesophageal reflux disease)   . Guillain-Barre syndrome ([place])   . Hiatal hernia   . Kidney stones   . Migraines   . PONV (postoperative nausea and vomiting)   . TMJ (dislocation of temporomandibular joint)   . UTI (urinary tract infection)    hx of  . Vitamin D deficiency     Past Surgical History:  Procedure Laterality Date  . ABDOMINAL HYSTERECTOMY    . ADNOIDS    . ANTERIOR CERVICAL DECOMP/DISCECTOMY FUSION N/A 10/27/2013   Procedure: ACDF/ANTERIOR CERVICAL DECOMPRESSION/DISCECTOMY FUSION  C5-C7  (2 LEVELS);  Surgeon: Melina Schools, MD;  Location: [place];  Service: Orthopedics;  Laterality: N/A;  . EYE SURGERY Bilateral    cataract removal    There were no vitals filed for this visit.  Subjective Assessment - 05/11/19 0900    Subjective  COVID-19 screening performed upon arrival. Patient report 6/10 in the right knee and states her knee feels full.    Pertinent History  unremarkable    Limitations  Standing;Walking;House hold activities     How long can you stand comfortably?  short time, weight bear mainly on L LE    How long can you walk comfortably?  short distances, toe touch weightbearing    Diagnostic tests  MRI: 2 stress fractures (behind patella and in tibia), torn cartilage, 2 cysts    Patient Stated Goals  improve strength for surgery.    Currently in Pain?  Yes    Pain Score  6     Pain Orientation  Right    Pain Descriptors / Indicators  Throbbing;Tightness    Pain Type  Chronic pain    Pain Onset  More than a month ago    Pain Frequency  Constant         [facility] PT Assessment - 05/11/19 0001      Assessment   Medical Diagnosis  right knee pain    Referring Provider (PT)  Esmond Plants, MD    Next MD Visit  May 19, 2019    Prior Therapy  no                   [facility] Adult PT Treatment/Exercise - 05/11/19 0001      Exercises   Exercises  Knee/Hip      Knee/Hip Exercises: Seated   Long Arc Quad  AROM;Right;3 sets;10 reps  Long [employer] Limitations  with ball squeeze    Clamshell with TheraBand  Yellow   x30   Marching  Right;3 sets;10 reps    Hamstring Curl  Right;3 sets;10 reps    Hamstring Limitations  yellow theraband      Knee/Hip Exercises: Supine   Short Writer;Other (comment)    Short Arc Quad Sets Limitations  with VMS for neuromuscular education 50 pps, 280 usec, 10/10 x8 mins      Modalities   Modalities  Psychologist, educational Location  R knee    Electrical Stimulation Action  IFC    Electrical Stimulation Parameters  80-150 hz x10 mins    Electrical Stimulation Goals  Pain      Vasopneumatic   Number Minutes Vasopneumatic   15 minutes    Vasopnuematic Location   Knee    Vasopneumatic Pressure  Medium    Vasopneumatic Temperature   34                  PT Long Term Goals - 05/11/19 0944      PT LONG TERM GOAL #1   Title  Patient will be independent with HEP     Time  4    Period  Weeks    Status  Achieved      PT LONG TERM GOAL #2   Title  Patient will demonstrate 4/5 or greater right knee MMT in all planes to improve stabilty during functional tasks    Time  4    Period  Weeks    Status  On-going      PT LONG TERM GOAL #3   Title  Patient will report ability to perform ADLs and work activities with right knee pain less than or equal to 3/10.    Time  4    Period  Weeks    Status  On-going      PT LONG TERM GOAL #4   Title  Patient will report ability to walk community distances with right knee pain less than or equal to 3/10.    Time  4    Period  Weeks    Status  Unable to assess   TTWB at this time with walking stick     PT LONG TERM GOAL #5   Title  Patient will report ability to stand for 20 minutes or greater with equal weight bearing through bilateral LEs to improve showering and other standing tasks.    Time  4    Period  Weeks    Status  Unable to assess   TTWB at this time until surgery           Plan - 05/11/19 0942    Clinical Impression Statement  Patient was able to complete treatment despite reports of "almost cramping" during last few repetitions of hamstring curls with resistance. Patient responded well to VMS and SAQ with ongoing extension lag. No adverse affects to modalities upon removal.    Personal Factors and Comorbidities  Time since onset of injury/illness/exacerbation    Examination-Activity Limitations  Stand;Locomotion Level;Bathing;Hygiene/Grooming    Stability/Clinical Decision Making  Stable/Uncomplicated    Clinical Decision Making  Low    Rehab Potential  Good    PT Frequency  2x / week    PT Duration  4 weeks    PT Treatment/Interventions  ADLs/Self Care Home Management;Electrical Stimulation;Cryotherapy;Ultrasound;Balance training;Therapeutic exercise;Therapeutic activities;Functional mobility  training;Stair training;Gait training;Neuromuscular re-education;Patient/family education;Passive  range of motion;Manual techniques;Vasopneumatic Device;Taping    PT Next Visit Plan  Nustep, gentle strengthening of right knee and hip e-stim and vaso for pain relief    PT Home Exercise Plan  see patient education section    Consulted and Agree with Plan of Care  Patient       Patient will benefit from skilled therapeutic intervention in order to improve the following deficits and impairments:  Decreased activity tolerance, Decreased strength, Decreased balance, Decreased range of motion, Difficulty walking, Pain  Visit Diagnosis: Chronic pain of right knee  Difficulty in walking, not elsewhere classified  Muscle weakness (generalized)     Problem List Patient Active Problem List   Diagnosis Date Noted  . Pain management contract agreement 05/16/2016  . Insomnia 04/13/2015  . BMI 26.0-26.9,adult 04/13/2015  . GAD (generalized anxiety disorder) 05/25/2014  . Hyperlipidemia 11/04/2012  . Migraines 11/04/2012  . GERD (gastroesophageal reflux disease) 11/04/2012  . Fibromyalgia 11/04/2012  . TMJ arthropathy 11/04/2012    Gabriela Eves, PT, DPT 05/11/2019, 9:54 AM  [facility] [address] [place], [place], [address] Phone: [phone]   Fax:  [phone]  Name: [name] MRN: [id number] Date of Birth: 02-Dec-1960

## 2019-05-14 ENCOUNTER — Encounter: Payer: Self-pay | Admitting: Physical Therapy

## 2019-05-14 ENCOUNTER — Ambulatory Visit: Payer: 59 | Admitting: Physical Therapy

## 2019-05-14 ENCOUNTER — Other Ambulatory Visit: Payer: Self-pay

## 2019-05-14 DIAGNOSIS — G8929 Other chronic pain: Secondary | ICD-10-CM

## 2019-05-14 DIAGNOSIS — M6281 Muscle weakness (generalized): Secondary | ICD-10-CM

## 2019-05-14 DIAGNOSIS — R262 Difficulty in walking, not elsewhere classified: Secondary | ICD-10-CM

## 2019-05-14 DIAGNOSIS — M25561 Pain in right knee: Secondary | ICD-10-CM

## 2019-05-14 NOTE — Therapy (Signed)
[facility] [place], [place], [address] Phone: [phone]   Fax:  [phone]  Physical Therapy Treatment  Patient Details  Name: [name] MRN: [id number] Date of Birth: 24-May-1961 Referring Provider (PT): Esmond Plants, MD   Encounter Date: 05/14/2019  PT End of Session - 05/14/19 1016    Visit Number  5    Number of Visits  8    Date for PT Re-Evaluation  06/01/19    Authorization Type  Progress note every 10th visit    PT Start Time  0855    PT Stop Time  0942    PT Time Calculation (min)  47 min    Activity Tolerance  Patient tolerated treatment well;Patient limited by pain    Behavior During Therapy  [facility] for tasks assessed/performed       Past Medical History:  Diagnosis Date  . Arthritis    right hand  . Dyslipidemia   . Family history of anesthesia complication    patients mother has severe nausea and vomiting after  . Fibromyalgia   . GERD (gastroesophageal reflux disease)   . Guillain-Barre syndrome ([place])   . Hiatal hernia   . Kidney stones   . Migraines   . PONV (postoperative nausea and vomiting)   . TMJ (dislocation of temporomandibular joint)   . UTI (urinary tract infection)    hx of  . Vitamin D deficiency     Past Surgical History:  Procedure Laterality Date  . ABDOMINAL HYSTERECTOMY    . ADNOIDS    . ANTERIOR CERVICAL DECOMP/DISCECTOMY FUSION N/A 10/27/2013   Procedure: ACDF/ANTERIOR CERVICAL DECOMPRESSION/DISCECTOMY FUSION  C5-C7  (2 LEVELS);  Surgeon: Melina Schools, MD;  Location: [place];  Service: Orthopedics;  Laterality: N/A;  . EYE SURGERY Bilateral    cataract removal    There were no vitals filed for this visit.  Subjective Assessment - 05/14/19 1016    Subjective  COVID-19 screening performed upon arrival. Patient reports feeling sore after last session but right knee pain is "about the same."    Pertinent History  unremarkable    Limitations  Standing;Walking;House  hold activities    How long can you stand comfortably?  short time, weight bear mainly on L LE    How long can you walk comfortably?  short distances, toe touch weightbearing    Diagnostic tests  MRI: 2 stress fractures (behind patella and in tibia), torn cartilage, 2 cysts    Patient Stated Goals  improve strength for surgery.    Currently in Pain?  Yes    Pain Score  6     Pain Location  Knee    Pain Orientation  Right    Pain Descriptors / Indicators  Throbbing;Tightness    Pain Type  Chronic pain    Pain Onset  More than a month ago    Pain Frequency  Constant         [facility] PT Assessment - 05/14/19 0001      Assessment   Medical Diagnosis  right knee pain    Referring Provider (PT)  Esmond Plants, MD    Next MD Visit  May 19, 2019    Prior Therapy  no                   [facility] Adult PT Treatment/Exercise - 05/14/19 0001      Exercises   Exercises  Knee/Hip      Knee/Hip Exercises: Stretches  Piriformis Stretch  3 reps;20 seconds    Piriformis Stretch Limitations  R knee to opposite chest      Knee/Hip Exercises: Seated   Long Arc Quad  AROM;Right;3 sets;10 reps    Clamshell with TheraBand  Yellow   x2 minutes   Hamstring Curl  Right;3 sets;10 reps    Hamstring Limitations  yellow theraband      Knee/Hip Exercises: Supine   Short Arc Quad Sets  --    Terminal Knee Extension  Strengthening;Right;2 sets;10 reps    Straight Leg Raises  AROM;Right;Other (comment)   x2 minutes   Straight Leg Raise with External Rotation  AROM;Right;2 sets;10 reps      Knee/Hip Exercises: Sidelying   Hip ABduction  AROM;2 sets;10 reps    Hip ADduction  AROM;Right;2 sets;10 reps      Knee/Hip Exercises: Prone   Straight Leg Raises  Right;2 sets;10 reps      Modalities   Modalities  Electrical Stimulation;Vasopneumatic      Acupuncturist Location  R knee    Electrical Stimulation Action  IFC    Electrical Stimulation Parameters   80-150 hz x10 mins    Electrical Stimulation Goals  Pain      Vasopneumatic   Number Minutes Vasopneumatic   10 minutes    Vasopnuematic Location   Knee    Vasopneumatic Pressure  Medium    Vasopneumatic Temperature   34                  PT Long Term Goals - 05/11/19 0944      PT LONG TERM GOAL #1   Title  Patient will be independent with HEP    Time  4    Period  Weeks    Status  Achieved      PT LONG TERM GOAL #2   Title  Patient will demonstrate 4/5 or greater right knee MMT in all planes to improve stabilty during functional tasks    Time  4    Period  Weeks    Status  On-going      PT LONG TERM GOAL #3   Title  Patient will report ability to perform ADLs and work activities with right knee pain less than or equal to 3/10.    Time  4    Period  Weeks    Status  On-going      PT LONG TERM GOAL #4   Title  Patient will report ability to walk community distances with right knee pain less than or equal to 3/10.    Time  4    Period  Weeks    Status  Unable to assess   TTWB at this time with walking stick     PT LONG TERM GOAL #5   Title  Patient will report ability to stand for 20 minutes or greater with equal weight bearing through bilateral LEs to improve showering and other standing tasks.    Time  4    Period  Weeks    Status  Unable to assess   TTWB at this time until surgery           Plan - 05/14/19 1017    Clinical Impression Statement  Patient was able to tolerate treatment fairly well but did report increase of pain with prone hamstring curl. Exercise terminated and alternative exercises were performed. Patient was able to complete SLR and SLR with extension but with extension lag  particularly towards end of reps. Patient to have surgery on Wednesday.    Personal Factors and Comorbidities  Time since onset of injury/illness/exacerbation    Examination-Activity Limitations  Stand;Locomotion Level;Bathing;Hygiene/Grooming     Stability/Clinical Decision Making  Stable/Uncomplicated    Clinical Decision Making  Low    Rehab Potential  Good    PT Frequency  2x / week    PT Duration  4 weeks    PT Treatment/Interventions  ADLs/Self Care Home Management;Electrical Stimulation;Cryotherapy;Ultrasound;Balance training;Therapeutic exercise;Therapeutic activities;Functional mobility training;Stair training;Gait training;Neuromuscular re-education;Patient/family education;Passive range of motion;Manual techniques;Vasopneumatic Device;Taping    PT Next Visit Plan  seated and supine strengthening of right knee    PT Home Exercise Plan  see patient education section    Consulted and Agree with Plan of Care  Patient       Patient will benefit from skilled therapeutic intervention in order to improve the following deficits and impairments:  Decreased activity tolerance, Decreased strength, Decreased balance, Decreased range of motion, Difficulty walking, Pain  Visit Diagnosis: Chronic pain of right knee  Difficulty in walking, not elsewhere classified  Muscle weakness (generalized)     Problem List Patient Active Problem List   Diagnosis Date Noted  . Pain management contract agreement 05/16/2016  . Insomnia 04/13/2015  . BMI 26.0-26.9,adult 04/13/2015  . GAD (generalized anxiety disorder) 05/25/2014  . Hyperlipidemia 11/04/2012  . Migraines 11/04/2012  . GERD (gastroesophageal reflux disease) 11/04/2012  . Fibromyalgia 11/04/2012  . TMJ arthropathy 11/04/2012    Gabriela Eves, PT, DPT 05/14/2019, 10:21 AM  [facility] [address] [place], [place], [address] Phone: [phone]   Fax:  [phone]  Name: [name] MRN: [id number] Date of Birth: 07-12-1960

## 2019-05-18 ENCOUNTER — Ambulatory Visit: Payer: 59 | Admitting: Physical Therapy

## 2019-05-21 ENCOUNTER — Encounter: Payer: 59 | Admitting: Physical Therapy

## 2019-05-25 ENCOUNTER — Encounter: Payer: 59 | Admitting: Physical Therapy

## 2019-06-01 ENCOUNTER — Ambulatory Visit: Payer: 59 | Admitting: Physical Therapy

## 2019-06-01 ENCOUNTER — Other Ambulatory Visit: Payer: Self-pay

## 2019-06-01 ENCOUNTER — Encounter: Payer: Self-pay | Admitting: Physical Therapy

## 2019-06-01 DIAGNOSIS — R262 Difficulty in walking, not elsewhere classified: Secondary | ICD-10-CM

## 2019-06-01 DIAGNOSIS — M25561 Pain in right knee: Secondary | ICD-10-CM

## 2019-06-01 DIAGNOSIS — M6281 Muscle weakness (generalized): Secondary | ICD-10-CM

## 2019-06-01 DIAGNOSIS — G8929 Other chronic pain: Secondary | ICD-10-CM

## 2019-06-01 NOTE — Therapy (Signed)
[facility] [place], [place], [address] Phone: [phone]   Fax:  [phone]  Physical Therapy Treatment  Patient Details  Name: [name] MRN: [id number] Date of Birth: May 13, 1961 Referring Provider (PT): Esmond Plants, MD   Encounter Date: 06/01/2019  PT End of Session - 06/01/19 1250    Visit Number  6    Number of Visits  16    Date for PT Re-Evaluation  07/13/19    Authorization Type  Progress note every 10th visit    PT Start Time  0855    PT Stop Time  0950    PT Time Calculation (min)  55 min    Activity Tolerance  Patient tolerated treatment well;Patient limited by pain    Behavior During Therapy  [facility] for tasks assessed/performed       Past Medical History:  Diagnosis Date  . Arthritis    right hand  . Dyslipidemia   . Family history of anesthesia complication    patients mother has severe nausea and vomiting after  . Fibromyalgia   . GERD (gastroesophageal reflux disease)   . Guillain-Barre syndrome ([place])   . Hiatal hernia   . Kidney stones   . Migraines   . PONV (postoperative nausea and vomiting)   . TMJ (dislocation of temporomandibular joint)   . UTI (urinary tract infection)    hx of  . Vitamin D deficiency     Past Surgical History:  Procedure Laterality Date  . ABDOMINAL HYSTERECTOMY    . ADNOIDS    . ANTERIOR CERVICAL DECOMP/DISCECTOMY FUSION N/A 10/27/2013   Procedure: ACDF/ANTERIOR CERVICAL DECOMPRESSION/DISCECTOMY FUSION  C5-C7  (2 LEVELS);  Surgeon: Melina Schools, MD;  Location: [place];  Service: Orthopedics;  Laterality: N/A;  . EYE SURGERY Bilateral    cataract removal    There were no vitals filed for this visit.  Subjective Assessment - 06/01/19 0948    Subjective  COVID-19 screening performed upon arrival. Patient arrives to PT S/P right knee arthroscopy with lateral menisectomy and chondroplasty 05/19/2019. Patient reports being compliant with HEP provided by MD as  well as icing. Patient reports pain at worst as 6/10 particularly at night and pain at best 2/10.    Pertinent History  unremarkable    Limitations  Standing;Walking;House hold activities    How long can you stand comfortably?  short time, weight bear mainly on L LE    How long can you walk comfortably?  short distances, toe touch weightbearing    Diagnostic tests  MRI: 2 stress fractures (behind patella and in tibia), torn cartilage, 2 cysts    Patient Stated Goals  improve strength for surgery.    Currently in Pain?  Yes    Pain Score  4     Pain Location  Knee    Pain Orientation  Right    Pain Descriptors / Indicators  Aching;Dull;Throbbing    Pain Type  Surgical pain    Pain Onset  1 to 4 weeks ago    Pain Frequency  Constant         [facility] PT Assessment - 06/01/19 0001      Assessment   Medical Diagnosis  right knee pain    Referring Provider (PT)  Esmond Plants, MD    Next MD Visit  06/24/2019    Prior Therapy  no      Observation/Other Assessments-Edema    Edema  Circumferential      Circumferential Edema  Circumferential - Right  38 cm at mid patella    Circumferential - Left   35.5 cm at mid patella      AROM   Right Knee Extension  10    Right Knee Flexion  135      Palpation   Patella mobility  WFL    Palpation comment  tender particularly around right posteriomedial knee.       Transfers   Comments  slow transitional movements, supervision.      Ambulation/Gait   Gait Pattern  Step-through pattern;Decreased stride length;Decreased stance time - right;Decreased step length - left;Decreased weight shift to right;Antalgic    Gait velocity  decreased gait speed cautious gait                   [facility] Adult PT Treatment/Exercise - 06/01/19 0001      Exercises   Exercises  Knee/Hip      Knee/Hip Exercises: Aerobic   Nustep  Level 2 x10 minutes      Modalities   Modalities  Electrical Stimulation;Vasopneumatic      Electrical Stimulation    Electrical Stimulation Location  R knee    Archivist Stimulation Parameters  80-150 hz x15 mins    Electrical Stimulation Goals  Pain      Vasopneumatic   Number Minutes Vasopneumatic   15 minutes    Vasopnuematic Location   Knee    Vasopneumatic Pressure  Low    Vasopneumatic Temperature   36                  PT Long Term Goals - 05/11/19 0944      PT LONG TERM GOAL #1   Title  Patient will be independent with HEP    Time  4    Period  Weeks    Status  Achieved      PT LONG TERM GOAL #2   Title  Patient will demonstrate 4/5 or greater right knee MMT in all planes to improve stabilty during functional tasks    Time  4    Period  Weeks    Status  On-going      PT LONG TERM GOAL #3   Title  Patient will report ability to perform ADLs and work activities with right knee pain less than or equal to 3/10.    Time  4    Period  Weeks    Status  On-going      PT LONG TERM GOAL #4   Title  Patient will report ability to walk community distances with right knee pain less than or equal to 3/10.    Time  4    Period  Weeks    Status  Unable to assess   TTWB at this time with walking stick     PT LONG TERM GOAL #5   Title  Patient will report ability to stand for 20 minutes or greater with equal weight bearing through bilateral LEs to improve showering and other standing tasks.    Time  4    Period  Weeks    Status  Unable to assess   TTWB at this time until surgery           Plan - 06/01/19 1328    Clinical Impression Statement  Patient arrives to physical therapy with right knee pain, difficulty walking, and decreased right knee extension AROM. Patient noted with slight increase in  edema in comparison to unaffected left. Patient and PT discussed pain free ROM and pain free strengthening throughout exercises. Patient reported undrerstanding. Re-cert sent requesting additional visits.    Personal Factors and  Comorbidities  Time since onset of injury/illness/exacerbation    Examination-Activity Limitations  Stand;Locomotion Level;Bathing;Hygiene/Grooming    Stability/Clinical Decision Making  Stable/Uncomplicated    Clinical Decision Making  Low    Rehab Potential  Good    PT Frequency  2x / week    PT Duration  4 weeks    PT Treatment/Interventions  ADLs/Self Care Home Management;Electrical Stimulation;Cryotherapy;Ultrasound;Balance training;Therapeutic exercise;Therapeutic activities;Functional mobility training;Stair training;Gait training;Neuromuscular re-education;Patient/family education;Passive range of motion;Manual techniques;Vasopneumatic Device;Taping    PT Next Visit Plan  seated and supine strengthening of right knee    Consulted and Agree with Plan of Care  Patient       Patient will benefit from skilled therapeutic intervention in order to improve the following deficits and impairments:  Decreased activity tolerance, Decreased strength, Decreased balance, Decreased range of motion, Difficulty walking, Pain  Visit Diagnosis: Chronic pain of right knee  Difficulty in walking, not elsewhere classified  Muscle weakness (generalized)     Problem List Patient Active Problem List   Diagnosis Date Noted  . Pain management contract agreement 05/16/2016  . Insomnia 04/13/2015  . BMI 26.0-26.9,adult 04/13/2015  . GAD (generalized anxiety disorder) 05/25/2014  . Hyperlipidemia 11/04/2012  . Migraines 11/04/2012  . GERD (gastroesophageal reflux disease) 11/04/2012  . Fibromyalgia 11/04/2012  . TMJ arthropathy 11/04/2012    Gabriela Eves, PT, DPT 06/01/2019, 5:27 PM  [facility] [address] [place], [place], [address] Phone: [phone]   Fax:  [phone]  Name: EMALEY BILLE MRN: [id number] Date of Birth: [birth year]

## 2019-06-08 ENCOUNTER — Ambulatory Visit: Payer: 59 | Admitting: Physical Therapy

## 2019-06-08 ENCOUNTER — Other Ambulatory Visit: Payer: Self-pay

## 2019-06-08 ENCOUNTER — Encounter: Payer: Self-pay | Admitting: Physical Therapy

## 2019-06-08 DIAGNOSIS — M25561 Pain in right knee: Secondary | ICD-10-CM | POA: Diagnosis not present

## 2019-06-08 DIAGNOSIS — M6281 Muscle weakness (generalized): Secondary | ICD-10-CM

## 2019-06-08 DIAGNOSIS — G8929 Other chronic pain: Secondary | ICD-10-CM

## 2019-06-08 DIAGNOSIS — R262 Difficulty in walking, not elsewhere classified: Secondary | ICD-10-CM

## 2019-06-08 NOTE — Therapy (Signed)
[facility] [place], [place], [address] Phone: [phone]   Fax:  [phone]  Physical Therapy Treatment  Patient Details  Name: [name] MRN: [id number] Date of Birth: 1961-01-15 Referring Provider (PT): Esmond Plants, MD   Encounter Date: 06/08/2019  PT End of Session - 06/08/19 0928    Visit Number  7    Number of Visits  16    Date for PT Re-Evaluation  07/13/19    Authorization Type  Progress note every 10th visit    PT Start Time  0857    PT Stop Time  0953    PT Time Calculation (min)  56 min    Activity Tolerance  Patient tolerated treatment well    Behavior During Therapy  [facility] for tasks assessed/performed       Past Medical History:  Diagnosis Date  . Arthritis    right hand  . Dyslipidemia   . Family history of anesthesia complication    patients mother has severe nausea and vomiting after  . Fibromyalgia   . GERD (gastroesophageal reflux disease)   . Guillain-Barre syndrome ([place])   . Hiatal hernia   . Kidney stones   . Migraines   . PONV (postoperative nausea and vomiting)   . TMJ (dislocation of temporomandibular joint)   . UTI (urinary tract infection)    hx of  . Vitamin D deficiency     Past Surgical History:  Procedure Laterality Date  . ABDOMINAL HYSTERECTOMY    . ADNOIDS    . ANTERIOR CERVICAL DECOMP/DISCECTOMY FUSION N/A 10/27/2013   Procedure: ACDF/ANTERIOR CERVICAL DECOMPRESSION/DISCECTOMY FUSION  C5-C7  (2 LEVELS);  Surgeon: Melina Schools, MD;  Location: [place];  Service: Orthopedics;  Laterality: N/A;  . EYE SURGERY Bilateral    cataract removal    There were no vitals filed for this visit.  Subjective Assessment - 06/08/19 0857    Subjective  COVID-19 screening performed upon arrival. Patient arrives with 2/10 pain reporting it feels "okay"    Pertinent History  unremarkable    Limitations  Standing;Walking;House hold activities    How long can you stand comfortably?   short time, weight bear mainly on L LE    How long can you walk comfortably?  short distances, toe touch weightbearing    Diagnostic tests  MRI: 2 stress fractures (behind patella and in tibia), torn cartilage, 2 cysts    Patient Stated Goals  improve strength for surgery.    Currently in Pain?  Yes    Pain Score  2     Pain Location  Knee    Pain Orientation  Right    Pain Descriptors / Indicators  Aching    Pain Type  Surgical pain    Pain Onset  1 to 4 weeks ago    Pain Frequency  Constant         [facility] PT Assessment - 06/08/19 0001      Assessment   Medical Diagnosis  right knee pain    Referring Provider (PT)  Esmond Plants, MD    Next MD Visit  06/24/2019    Prior Therapy  no                   [facility] Adult PT Treatment/Exercise - 06/08/19 0001      Exercises   Exercises  Knee/Hip      Knee/Hip Exercises: Stretches   Passive Hamstring Stretch  Right;3 reps;30 seconds  Knee/Hip Exercises: Aerobic   Nustep  Level 4 x10 minutes      Knee/Hip Exercises: Standing   Heel Raises  Both;2 sets;10 reps    Heel Raises Limitations  toe raises 2x10     Forward Step Up  2 sets;Right;20 reps;Hand Hold: 2;Step Height: 6"    Rocker Board Limitations  attempted but pain, terminated    Other Standing Knee Exercises  lateral stepping x3 minutes      Knee/Hip Exercises: Seated   Long Arc Quad  AROM;Right;10 reps;2 sets    [employer] Limitations  with ball squeeze    Clamshell with TheraBand  Red   x30     Modalities   Modalities  Psychologist, educational Location  Right knee     Electrical Stimulation Action  IFC    Electrical Stimulation Parameters  80-150 hz x15 mins    Electrical Stimulation Goals  Pain      Vasopneumatic   Number Minutes Vasopneumatic   15 minutes    Vasopnuematic Location   Knee    Vasopneumatic Pressure  Low    Vasopneumatic Temperature   36             PT  Education - 06/08/19 1005    Education Details  hip abduction supine, hip abduction standing, hamstring stretching supine and standing.    Person(s) Educated  Patient    Methods  Explanation;Demonstration;Handout    Comprehension  Verbalized understanding;Returned demonstration          PT Long Term Goals - 05/11/19 0944      PT LONG TERM GOAL #1   Title  Patient will be independent with HEP    Time  4    Period  Weeks    Status  Achieved      PT LONG TERM GOAL #2   Title  Patient will demonstrate 4/5 or greater right knee MMT in all planes to improve stabilty during functional tasks    Time  4    Period  Weeks    Status  On-going      PT LONG TERM GOAL #3   Title  Patient will report ability to perform ADLs and work activities with right knee pain less than or equal to 3/10.    Time  4    Period  Weeks    Status  On-going      PT LONG TERM GOAL #4   Title  Patient will report ability to walk community distances with right knee pain less than or equal to 3/10.    Time  4    Period  Weeks    Status  Unable to assess   TTWB at this time with walking stick     PT LONG TERM GOAL #5   Title  Patient will report ability to stand for 20 minutes or greater with equal weight bearing through bilateral LEs to improve showering and other standing tasks.    Time  4    Period  Weeks    Status  Unable to assess   TTWB at this time until surgery           Plan - 06/08/19 1008    Clinical Impression Statement  Patient responded well to therapy despite an increase of soreness in right knee after 10 mins of nustep. Patient responded well to standing TEs with good form. Patient did report a catching feeling in  posterior knee with seated LAQs. Patient and PT discussed progression of exercises as tolerated throughout episode of care. Patient reported understanding. Patient would benefit from skilled physical therapy to address deficits and address patient's goals.    Personal Factors  and Comorbidities  Time since onset of injury/illness/exacerbation    Examination-Activity Limitations  Stand;Locomotion Level;Bathing;Hygiene/Grooming    Stability/Clinical Decision Making  Stable/Uncomplicated    Clinical Decision Making  Low    Rehab Potential  Good    PT Frequency  2x / week    PT Duration  4 weeks    PT Treatment/Interventions  ADLs/Self Care Home Management;Electrical Stimulation;Cryotherapy;Ultrasound;Balance training;Therapeutic exercise;Therapeutic activities;Functional mobility training;Stair training;Gait training;Neuromuscular re-education;Patient/family education;Passive range of motion;Manual techniques;Vasopneumatic Device;Taping    PT Next Visit Plan  seated and supine strengthening of right knee    PT Home Exercise Plan  see patient education section    Consulted and Agree with Plan of Care  Patient       Patient will benefit from skilled therapeutic intervention in order to improve the following deficits and impairments:  Decreased activity tolerance, Decreased strength, Decreased balance, Decreased range of motion, Difficulty walking, Pain  Visit Diagnosis: Chronic pain of right knee  Difficulty in walking, not elsewhere classified  Muscle weakness (generalized)     Problem List Patient Active Problem List   Diagnosis Date Noted  . Pain management contract agreement 05/16/2016  . Insomnia 04/13/2015  . BMI 26.0-26.9,adult 04/13/2015  . GAD (generalized anxiety disorder) 05/25/2014  . Hyperlipidemia 11/04/2012  . Migraines 11/04/2012  . GERD (gastroesophageal reflux disease) 11/04/2012  . Fibromyalgia 11/04/2012  . TMJ arthropathy 11/04/2012    Gabriela Eves, PT, DPT 06/08/2019, 10:16 AM  [facility] [address] [place], [place], [address] Phone: [phone]   Fax:  [phone]  Name: [name] MRN: [id number] Date of Birth: [birth year]

## 2019-06-15 ENCOUNTER — Other Ambulatory Visit: Payer: Self-pay

## 2019-06-15 ENCOUNTER — Encounter: Payer: Self-pay | Admitting: Physical Therapy

## 2019-06-15 ENCOUNTER — Ambulatory Visit: Payer: 59 | Attending: Orthopedic Surgery | Admitting: Physical Therapy

## 2019-06-15 DIAGNOSIS — R262 Difficulty in walking, not elsewhere classified: Secondary | ICD-10-CM | POA: Insufficient documentation

## 2019-06-15 DIAGNOSIS — G8929 Other chronic pain: Secondary | ICD-10-CM | POA: Diagnosis present

## 2019-06-15 DIAGNOSIS — M6281 Muscle weakness (generalized): Secondary | ICD-10-CM | POA: Insufficient documentation

## 2019-06-15 DIAGNOSIS — M25561 Pain in right knee: Secondary | ICD-10-CM | POA: Diagnosis present

## 2019-06-15 NOTE — Therapy (Signed)
[facility] [place], [place], [address] Phone: [phone]   Fax:  [phone]  Physical Therapy Treatment  Patient Details  Name: [name] MRN: [id number] Date of Birth: Oct 05, 1960 Referring Provider (PT): Esmond Plants, MD   Encounter Date: 06/15/2019  PT End of Session - 06/15/19 1041    Visit Number  8    Date for PT Re-Evaluation  07/13/19    Authorization Type  Progress note every 10th visit    PT Start Time  0856    PT Stop Time  0950    PT Time Calculation (min)  54 min    Activity Tolerance  Patient tolerated treatment well;Patient limited by pain    Behavior During Therapy  [facility] for tasks assessed/performed       Past Medical History:  Diagnosis Date  . Arthritis    right hand  . Dyslipidemia   . Family history of anesthesia complication    patients mother has severe nausea and vomiting after  . Fibromyalgia   . GERD (gastroesophageal reflux disease)   . Guillain-Barre syndrome ([place])   . Hiatal hernia   . Kidney stones   . Migraines   . PONV (postoperative nausea and vomiting)   . TMJ (dislocation of temporomandibular joint)   . UTI (urinary tract infection)    hx of  . Vitamin D deficiency     Past Surgical History:  Procedure Laterality Date  . ABDOMINAL HYSTERECTOMY    . ADNOIDS    . ANTERIOR CERVICAL DECOMP/DISCECTOMY FUSION N/A 10/27/2013   Procedure: ACDF/ANTERIOR CERVICAL DECOMPRESSION/DISCECTOMY FUSION  C5-C7  (2 LEVELS);  Surgeon: Melina Schools, MD;  Location: [place];  Service: Orthopedics;  Laterality: N/A;  . EYE SURGERY Bilateral    cataract removal    There were no vitals filed for this visit.  Subjective Assessment - 06/15/19 1040    Subjective  COVID-19 screening performed upon arrival. Patient arrives with reports of right knee soreness and is unsure why.    Pertinent History  unremarkable    Limitations  Standing;Walking;House hold activities    How long can you stand  comfortably?  short time, weight bear mainly on L LE    How long can you walk comfortably?  short distances, toe touch weightbearing    Diagnostic tests  MRI: 2 stress fractures (behind patella and in tibia), torn cartilage, 2 cysts    Patient Stated Goals  improve strength for surgery.    Currently in Pain?  Yes   did not provide number on pain scale        [facility] PT Assessment - 06/15/19 0001      Assessment   Medical Diagnosis  right knee pain    Referring Provider (PT)  Esmond Plants, MD    Next MD Visit  06/24/2019    Prior Therapy  no                   [facility] Adult PT Treatment/Exercise - 06/15/19 0001      Exercises   Exercises  Knee/Hip      Knee/Hip Exercises: Aerobic   Nustep  Level 2 x10 minutes      Knee/Hip Exercises: Standing   Heel Raises  --    Heel Raises Limitations  --    Lateral Step Up  Right;2 sets;10 reps;Hand Hold: 2;Step Height: 4"    Forward Step Up  2 sets;Right;20 reps;Hand Hold: 2;Step Height: 4"    Functional Squat  1 set;10 reps   mini squat with UE support   Other Standing Knee Exercises  lateral stepping yellow TB x3 minutes      Knee/Hip Exercises: Supine   Heel Prop for Knee Extension  3 minutes;Other (comment)   during STW/M to R hamstrings     Modalities   Modalities  Electrical Stimulation;Vasopneumatic      Acupuncturist Location  Right knee     Electrical Stimulation Action  IFC    Electrical Stimulation Parameters  80-150 hz x10 mins    Electrical Stimulation Goals  Pain      Vasopneumatic   Number Minutes Vasopneumatic   15 minutes    Vasopnuematic Location   Knee    Vasopneumatic Pressure  Low    Vasopneumatic Temperature   36      Manual Therapy   Manual Therapy  Soft tissue mobilization    Soft tissue mobilization  STW/M to right posterior knee and hamstring tendons to decrease pain and tone.                   PT Long Term Goals - 05/11/19 0944      PT LONG  TERM GOAL #1   Title  Patient will be independent with HEP    Time  4    Period  Weeks    Status  Achieved      PT LONG TERM GOAL #2   Title  Patient will demonstrate 4/5 or greater right knee MMT in all planes to improve stabilty during functional tasks    Time  4    Period  Weeks    Status  On-going      PT LONG TERM GOAL #3   Title  Patient will report ability to perform ADLs and work activities with right knee pain less than or equal to 3/10.    Time  4    Period  Weeks    Status  On-going      PT LONG TERM GOAL #4   Title  Patient will report ability to walk community distances with right knee pain less than or equal to 3/10.    Time  4    Period  Weeks    Status  Unable to assess   TTWB at this time with walking stick     PT LONG TERM GOAL #5   Title  Patient will report ability to stand for 20 minutes or greater with equal weight bearing through bilateral LEs to improve showering and other standing tasks.    Time  4    Period  Weeks    Status  Unable to assess   TTWB at this time until surgery           Plan - 06/15/19 1042    Clinical Impression Statement  Patient was able to complete treatment but with pain. Patient was able to demonstrate good form standing exercises after demonstration and explanation. Patient noted with tightness in right hamstring tendons during STW/M and heel prop for extension. Patient educated on performing hamsting stretch as well as self STW/M to reduce tone in hamstrings. Patient reported sharp increase of pain in right knee after heel prop. Pain alleviated after movement. No adverse affects upon removal of modalities.    Personal Factors and Comorbidities  Time since onset of injury/illness/exacerbation    Examination-Activity Limitations  Stand;Locomotion Level;Bathing;Hygiene/Grooming    Stability/Clinical Decision Making  Stable/Uncomplicated    Clinical Decision  Making  Low    Rehab Potential  Good    PT Frequency  2x / week     PT Duration  4 weeks    PT Treatment/Interventions  ADLs/Self Care Home Management;Electrical Stimulation;Cryotherapy;Ultrasound;Balance training;Therapeutic exercise;Therapeutic activities;Functional mobility training;Stair training;Gait training;Neuromuscular re-education;Patient/family education;Passive range of motion;Manual techniques;Vasopneumatic Device;Taping    PT Next Visit Plan  continue with pain free strengthening, STW/M to right hamstrings, modalities PRN for pain relief.    PT Home Exercise Plan  see patient education section    Consulted and Agree with Plan of Care  Patient       Patient will benefit from skilled therapeutic intervention in order to improve the following deficits and impairments:  Decreased activity tolerance, Decreased strength, Decreased balance, Decreased range of motion, Difficulty walking, Pain  Visit Diagnosis: Chronic pain of right knee  Muscle weakness (generalized)  Difficulty in walking, not elsewhere classified     Problem List Patient Active Problem List   Diagnosis Date Noted  . Pain management contract agreement 05/16/2016  . Insomnia 04/13/2015  . BMI 26.0-26.9,adult 04/13/2015  . GAD (generalized anxiety disorder) 05/25/2014  . Hyperlipidemia 11/04/2012  . Migraines 11/04/2012  . GERD (gastroesophageal reflux disease) 11/04/2012  . Fibromyalgia 11/04/2012  . TMJ arthropathy 11/04/2012    Gabriela Eves, PT, DPT 06/15/2019, 10:55 AM  [facility] [address] [place], [place], [address] Phone: [phone]   Fax:  [phone]  Name: [name] MRN: [id number] Date of Birth: Jan 19, 1961

## 2019-06-18 ENCOUNTER — Ambulatory Visit: Payer: 59 | Admitting: Physical Therapy

## 2019-06-22 ENCOUNTER — Other Ambulatory Visit: Payer: Self-pay

## 2019-06-22 ENCOUNTER — Ambulatory Visit: Payer: 59 | Admitting: Physical Therapy

## 2019-06-22 DIAGNOSIS — M6281 Muscle weakness (generalized): Secondary | ICD-10-CM

## 2019-06-22 DIAGNOSIS — G8929 Other chronic pain: Secondary | ICD-10-CM

## 2019-06-22 DIAGNOSIS — R262 Difficulty in walking, not elsewhere classified: Secondary | ICD-10-CM

## 2019-06-22 DIAGNOSIS — M25561 Pain in right knee: Secondary | ICD-10-CM | POA: Diagnosis not present

## 2019-06-22 NOTE — Therapy (Signed)
[facility] [place], [place], [address] Phone: [phone]   Fax:  [phone]  Physical Therapy Treatment  Patient Details  Name: [name] MRN: [id number] Date of Birth: 03/04/1961 Referring Provider (PT): Esmond Plants, MD   Encounter Date: 06/22/2019  PT End of Session - 06/22/19 0900    Visit Number  9    Number of Visits  16    Date for PT Re-Evaluation  07/13/19    Authorization Type  Progress note every 10th visit    PT Start Time  0850    PT Stop Time  0933    PT Time Calculation (min)  43 min    Activity Tolerance  Patient tolerated treatment well    Behavior During Therapy  [facility] for tasks assessed/performed       Past Medical History:  Diagnosis Date  . Arthritis    right hand  . Dyslipidemia   . Family history of anesthesia complication    patients mother has severe nausea and vomiting after  . Fibromyalgia   . GERD (gastroesophageal reflux disease)   . Guillain-Barre syndrome ([place])   . Hiatal hernia   . Kidney stones   . Migraines   . PONV (postoperative nausea and vomiting)   . TMJ (dislocation of temporomandibular joint)   . UTI (urinary tract infection)    hx of  . Vitamin D deficiency     Past Surgical History:  Procedure Laterality Date  . ABDOMINAL HYSTERECTOMY    . ADNOIDS    . ANTERIOR CERVICAL DECOMP/DISCECTOMY FUSION N/A 10/27/2013   Procedure: ACDF/ANTERIOR CERVICAL DECOMPRESSION/DISCECTOMY FUSION  C5-C7  (2 LEVELS);  Surgeon: Melina Schools, MD;  Location: [place];  Service: Orthopedics;  Laterality: N/A;  . EYE SURGERY Bilateral    cataract removal    There were no vitals filed for this visit.  Subjective Assessment - 06/22/19 0928    Subjective  COVID-19 screening performed upon arrival. Patient arrives feeling unwell and "swimmy headed". Patient also reported falling over the weekend on her left knee stating her left knee gave out when she was getting out of bed.    Pertinent History  unremarkable    Limitations  Standing;Walking;House hold activities    How long can you stand comfortably?  short time, weight bear mainly on L LE    How long can you walk comfortably?  short distances, toe touch weightbearing    Diagnostic tests  MRI: 2 stress fractures (behind patella and in tibia), torn cartilage, 2 cysts    Patient Stated Goals  improve strength for surgery.    Currently in Pain?  Yes   did not provide number on pain scale        [facility] PT Assessment - 06/22/19 0001      Assessment   Medical Diagnosis  right knee pain    Referring Provider (PT)  Esmond Plants, MD    Next MD Visit  06/24/2019    Prior Therapy  no                   [facility] Adult PT Treatment/Exercise - 06/22/19 0001      Exercises   Exercises  Knee/Hip      Knee/Hip Exercises: Standing   Forward Step Up  2 sets;Right;20 reps;Hand Hold: 2;Step Height: 6"    Rocker Board  2 minutes    Other Standing Knee Exercises  lateral stepping x3 minutes      Knee/Hip Exercises: Seated  Long Arc Quad  AROM;Right;10 reps;2 sets    Long Arc Quad Weight  1 lbs.    Clamshell with TheraBand  --    Other Seated Knee/Hip Exercises  ball squeeze 5" hold x20    Hamstring Curl  Right;10 reps;1 set    Hamstring Limitations  yellow theraband      Modalities   Modalities  Electrical Stimulation;Vasopneumatic      Electrical Stimulation   Electrical Stimulation Location  Right knee     Electrical Stimulation Action  IFC    Electrical Stimulation Parameters  80-150 hz x15 mins    Electrical Stimulation Goals  Pain      Vasopneumatic   Number Minutes Vasopneumatic   15 minutes    Vasopnuematic Location   Knee    Vasopneumatic Pressure  Low    Vasopneumatic Temperature   36                  PT Long Term Goals - 05/11/19 0944      PT LONG TERM GOAL #1   Title  Patient will be independent with HEP    Time  4    Period  Weeks    Status  Achieved      PT LONG TERM  GOAL #2   Title  Patient will demonstrate 4/5 or greater right knee MMT in all planes to improve stabilty during functional tasks    Time  4    Period  Weeks    Status  On-going      PT LONG TERM GOAL #3   Title  Patient will report ability to perform ADLs and work activities with right knee pain less than or equal to 3/10.    Time  4    Period  Weeks    Status  On-going      PT LONG TERM GOAL #4   Title  Patient will report ability to walk community distances with right knee pain less than or equal to 3/10.    Time  4    Period  Weeks    Status  Unable to assess   TTWB at this time with walking stick     PT LONG TERM GOAL #5   Title  Patient will report ability to stand for 20 minutes or greater with equal weight bearing through bilateral LEs to improve showering and other standing tasks.    Time  4    Period  Weeks    Status  Unable to assess   TTWB at this time until surgery           Plan - 06/22/19 0929    Clinical Impression Statement  Due to feeling unwell and left knee pain and soreness due to the fall a shortened treatment was performed. Patient reported an increase of right knee pain with hamstring curls with red theraband; patient was able to perform with less discomfort with yellow theraband. Patient with no adverse affects upon removal upon modalities    Personal Factors and Comorbidities  Time since onset of injury/illness/exacerbation    Examination-Activity Limitations  Stand;Locomotion Level;Bathing;Hygiene/Grooming    Stability/Clinical Decision Making  Stable/Uncomplicated    Clinical Decision Making  Low    Rehab Potential  Good    PT Frequency  2x / week    PT Duration  4 weeks    PT Treatment/Interventions  ADLs/Self Care Home Management;Electrical Stimulation;Cryotherapy;Ultrasound;Balance training;Therapeutic exercise;Therapeutic activities;Functional mobility training;Stair training;Gait training;Neuromuscular re-education;Patient/family  education;Passive range of motion;Manual techniques;Vasopneumatic  Device;Taping    PT Next Visit Plan  continue with pain free strengthening, STW/M to right hamstrings, modalities PRN for pain relief.    Consulted and Agree with Plan of Care  Patient       Patient will benefit from skilled therapeutic intervention in order to improve the following deficits and impairments:  Decreased activity tolerance, Decreased strength, Decreased balance, Decreased range of motion, Difficulty walking, Pain  Visit Diagnosis: Chronic pain of right knee  Muscle weakness (generalized)  Difficulty in walking, not elsewhere classified     Problem List Patient Active Problem List   Diagnosis Date Noted  . Pain management contract agreement 05/16/2016  . Insomnia 04/13/2015  . BMI 26.0-26.9,adult 04/13/2015  . GAD (generalized anxiety disorder) 05/25/2014  . Hyperlipidemia 11/04/2012  . Migraines 11/04/2012  . GERD (gastroesophageal reflux disease) 11/04/2012  . Fibromyalgia 11/04/2012  . TMJ arthropathy 11/04/2012    Gabriela Eves PT, DPT  06/22/2019, 9:47 AM  [facility] [address] [place], [place], [address] Phone: [phone]   Fax:  [phone]  Name: [name] MRN: [id number] Date of Birth: [birth year]

## 2019-06-25 ENCOUNTER — Ambulatory Visit: Payer: 59 | Admitting: Physical Therapy

## 2019-06-25 ENCOUNTER — Other Ambulatory Visit: Payer: Self-pay

## 2019-06-25 DIAGNOSIS — G8929 Other chronic pain: Secondary | ICD-10-CM

## 2019-06-25 DIAGNOSIS — M6281 Muscle weakness (generalized): Secondary | ICD-10-CM

## 2019-06-25 DIAGNOSIS — R262 Difficulty in walking, not elsewhere classified: Secondary | ICD-10-CM

## 2019-06-25 DIAGNOSIS — M25561 Pain in right knee: Secondary | ICD-10-CM | POA: Diagnosis not present

## 2019-06-25 NOTE — Therapy (Signed)
[facility] [place], [place], [address] Phone: [phone]   Fax:  [phone]  Physical Therapy Treatment   Progress Note Reporting Period 04/27/2019 to 06/25/2019  See note below for Objective Data and Assessment of Progress/Goals. Patient has made improvements with functional activities but still with discomfort with prolonged standing and walking. Goals are ongoing at this time. Gabriela Eves, PT, DPT   Patient Details  Name: [name] MRN: [id number] Date of Birth: 05/05/1961 Referring Provider (PT): Esmond Plants, MD   Encounter Date: 06/25/2019  PT End of Session - 06/25/19 1246    Visit Number  10    Number of Visits  16    Date for PT Re-Evaluation  07/13/19    Authorization Type  Progress note every 10th visit    PT Start Time  0858    PT Stop Time  0950    PT Time Calculation (min)  52 min    Activity Tolerance  Patient tolerated treatment well    Behavior During Therapy  [facility] for tasks assessed/performed       Past Medical History:  Diagnosis Date  . Arthritis    right hand  . Dyslipidemia   . Family history of anesthesia complication    patients mother has severe nausea and vomiting after  . Fibromyalgia   . GERD (gastroesophageal reflux disease)   . Guillain-Barre syndrome ([place])   . Hiatal hernia   . Kidney stones   . Migraines   . PONV (postoperative nausea and vomiting)   . TMJ (dislocation of temporomandibular joint)   . UTI (urinary tract infection)    hx of  . Vitamin D deficiency     Past Surgical History:  Procedure Laterality Date  . ABDOMINAL HYSTERECTOMY    . ADNOIDS    . ANTERIOR CERVICAL DECOMP/DISCECTOMY FUSION N/A 10/27/2013   Procedure: ACDF/ANTERIOR CERVICAL DECOMPRESSION/DISCECTOMY FUSION  C5-C7  (2 LEVELS);  Surgeon: Melina Schools, MD;  Location: [facility];  Service: Orthopedics;  Laterality: N/A;  . EYE SURGERY Bilateral    cataract removal    There were no  vitals filed for this visit.  Subjective Assessment - 06/25/19 1244    Subjective  COVID-19 screening performed upon arrival. Patient reported MD follow up went well. Patient reported continuing PT and to be cautious of twisting and stair negotiation due to limited strength.    Pertinent History  unremarkable    Limitations  Standing;Walking;House hold activities    How long can you stand comfortably?  short time, weight bear mainly on L LE    How long can you walk comfortably?  short distances, toe touch weightbearing    Diagnostic tests  MRI: 2 stress fractures (behind patella and in tibia), torn cartilage, 2 cysts    Patient Stated Goals  improve strength for surgery.    Currently in Pain?  Yes    Pain Score  1     Pain Location  Knee    Pain Orientation  Right    Pain Descriptors / Indicators  Aching;Sore    Pain Type  Surgical pain    Pain Onset  1 to 4 weeks ago    Pain Frequency  Constant         [facility] PT Assessment - 06/25/19 0001      Assessment   Medical Diagnosis  right knee pain    Referring Provider (PT)  Esmond Plants, MD    Next MD Visit  February 2021  Prior Therapy  no                   [facility] Adult PT Treatment/Exercise - 06/25/19 0001      Exercises   Exercises  Knee/Hip      Knee/Hip Exercises: Aerobic   Nustep  Level 2 x10 minutes      Knee/Hip Exercises: Standing   Forward Step Up  3 sets;10 reps;Hand Hold: 2;Step Height: 6"    Step Down  Right;15 reps;Step Height: 2";Hand Hold: 2    Rocker Board  3 minutes    Other Standing Knee Exercises  lateral stepping x3 minutes yellow theraband      Knee/Hip Exercises: Seated   Long Arc Quad  AROM;Right;10 reps;3 sets    Long Arc Quad Weight  1 lbs.    Hamstring Curl  Right;10 reps;1 set    Hamstring Limitations  yellow theraband      Modalities   Modalities  Electrical Stimulation;Vasopneumatic      Electrical Stimulation   Electrical Stimulation Location  Right knee     Electrical  Stimulation Action  IFC    Electrical Stimulation Parameters  80-150 hz x15 mins    Electrical Stimulation Goals  Pain      Vasopneumatic   Number Minutes Vasopneumatic   15 minutes    Vasopnuematic Location   Knee    Vasopneumatic Pressure  Low    Vasopneumatic Temperature   38                  PT Long Term Goals - 06/25/19 1250      PT LONG TERM GOAL #1   Title  Patient will be independent with HEP    Time  4    Period  Weeks    Status  Achieved      PT LONG TERM GOAL #2   Title  Patient will demonstrate 4/5 or greater right knee MMT in all planes to improve stabilty during functional tasks    Time  4    Period  Weeks    Status  On-going      PT LONG TERM GOAL #3   Title  Patient will report ability to perform ADLs and work activities with right knee pain less than or equal to 3/10.    Time  4    Period  Weeks    Status  On-going      PT LONG TERM GOAL #4   Title  Patient will report ability to walk community distances with right knee pain less than or equal to 3/10.    Time  4    Period  Weeks    Status  On-going      PT LONG TERM GOAL #5   Title  Patient will report ability to stand for 20 minutes or greater with equal weight bearing through bilateral LEs to improve showering and other standing tasks.    Period  Weeks    Status  On-going            Plan - 06/25/19 1246    Clinical Impression Statement  Patient responded fairly well to therapy session with no significant increase of pain with standing exercises. Patient still reports pain with resisted hamstring curl therefore reps were reduced. Patient and PT discussed slow progression of TEs for strengthening all while still being mindful of pain free ROM and strengthening. Patient reported understanding. No adverse affects upon removal of modalities.    Personal Factors  and Comorbidities  Time since onset of injury/illness/exacerbation    Examination-Activity Limitations  Stand;Locomotion  Level;Bathing;Hygiene/Grooming    Stability/Clinical Decision Making  Stable/Uncomplicated    Clinical Decision Making  Low    Rehab Potential  Good    PT Frequency  2x / week    PT Duration  4 weeks    PT Treatment/Interventions  ADLs/Self Care Home Management;Electrical Stimulation;Cryotherapy;Ultrasound;Balance training;Therapeutic exercise;Therapeutic activities;Functional mobility training;Stair training;Gait training;Neuromuscular re-education;Patient/family education;Passive range of motion;Manual techniques;Vasopneumatic Device;Taping    PT Next Visit Plan  continue with pain free strengthening, STW/M to right hamstrings, modalities PRN for pain relief.    PT Home Exercise Plan  see patient education section    Consulted and Agree with Plan of Care  Patient       Patient will benefit from skilled therapeutic intervention in order to improve the following deficits and impairments:  Decreased activity tolerance, Decreased strength, Decreased balance, Decreased range of motion, Difficulty walking, Pain  Visit Diagnosis: Chronic pain of right knee  Muscle weakness (generalized)  Difficulty in walking, not elsewhere classified     Problem List Patient Active Problem List   Diagnosis Date Noted  . Pain management contract agreement 05/16/2016  . Insomnia 04/13/2015  . BMI 26.0-26.9,adult 04/13/2015  . GAD (generalized anxiety disorder) 05/25/2014  . Hyperlipidemia 11/04/2012  . Migraines 11/04/2012  . GERD (gastroesophageal reflux disease) 11/04/2012  . Fibromyalgia 11/04/2012  . TMJ arthropathy 11/04/2012    Gabriela Eves, PT, DPT 06/25/2019, 12:53 PM  [facility] [address] [place], [place], [address] Phone: [phone]   Fax:  [phone]  Name: [name] MRN: [id number] Date of Birth: 05-02-1961

## 2019-06-29 ENCOUNTER — Other Ambulatory Visit: Payer: Self-pay

## 2019-06-29 ENCOUNTER — Ambulatory Visit: Payer: 59 | Admitting: Physical Therapy

## 2019-06-29 DIAGNOSIS — M6281 Muscle weakness (generalized): Secondary | ICD-10-CM

## 2019-06-29 DIAGNOSIS — G8929 Other chronic pain: Secondary | ICD-10-CM

## 2019-06-29 DIAGNOSIS — R262 Difficulty in walking, not elsewhere classified: Secondary | ICD-10-CM

## 2019-06-29 DIAGNOSIS — M25561 Pain in right knee: Secondary | ICD-10-CM | POA: Diagnosis not present

## 2019-06-29 NOTE — Therapy (Signed)
[facility] [place], [place], [address] Phone: [phone]   Fax:  [phone]  Physical Therapy Treatment  Patient Details  Name: [name] MRN: [id number] Date of Birth: January 03, 1961 Referring Provider (PT): Esmond Plants, MD   Encounter Date: 06/29/2019  PT End of Session - 06/29/19 1029    Visit Number  11    Number of Visits  16    Date for PT Re-Evaluation  07/13/19    Authorization Type  Progress note every 10th visit    PT Start Time  0900    PT Stop Time  0956    PT Time Calculation (min)  56 min    Activity Tolerance  Patient tolerated treatment well    Behavior During Therapy  [facility] for tasks assessed/performed       Past Medical History:  Diagnosis Date  . Arthritis    right hand  . Dyslipidemia   . Family history of anesthesia complication    patients mother has severe nausea and vomiting after  . Fibromyalgia   . GERD (gastroesophageal reflux disease)   . Guillain-Barre syndrome ([place])   . Hiatal hernia   . Kidney stones   . Migraines   . PONV (postoperative nausea and vomiting)   . TMJ (dislocation of temporomandibular joint)   . UTI (urinary tract infection)    hx of  . Vitamin D deficiency     Past Surgical History:  Procedure Laterality Date  . ABDOMINAL HYSTERECTOMY    . ADNOIDS    . ANTERIOR CERVICAL DECOMP/DISCECTOMY FUSION N/A 10/27/2013   Procedure: ACDF/ANTERIOR CERVICAL DECOMPRESSION/DISCECTOMY FUSION  C5-C7  (2 LEVELS);  Surgeon: Melina Schools, MD;  Location: [facility];  Service: Orthopedics;  Laterality: N/A;  . EYE SURGERY Bilateral    cataract removal    There were no vitals filed for this visit.  Subjective Assessment - 06/29/19 0908    Subjective  COVID-19 screening performed upon arrival. Reports feeling tender but states she's been on it a lot. Also reports she did well cleaning the basement.    Pertinent History  unremarkable    Limitations  Standing;Walking;House hold  activities    How long can you stand comfortably?  short time, weight bear mainly on L LE    How long can you walk comfortably?  short distances, toe touch weightbearing    Diagnostic tests  MRI: 2 stress fractures (behind patella and in tibia), torn cartilage, 2 cysts    Patient Stated Goals  improve strength for surgery.    Currently in Pain?  Yes    Pain Score  1     Pain Location  Knee    Pain Orientation  Right    Pain Descriptors / Indicators  Tender    Pain Type  Surgical pain    Pain Onset  1 to 4 weeks ago    Pain Frequency  Constant         [facility] PT Assessment - 06/29/19 0001      Assessment   Medical Diagnosis  right knee pain    Referring Provider (PT)  Esmond Plants, MD    Next MD Visit  February 2021    Prior Therapy  no                   [facility] Adult PT Treatment/Exercise - 06/29/19 0001      Exercises   Exercises  Knee/Hip      Knee/Hip Exercises: Aerobic  Nustep  Level 2 x10 minutes      Knee/Hip Exercises: Machines for Strengthening   Cybex Leg Press  1 plate x10, seat 8      Knee/Hip Exercises: Standing   Heel Raises  Both;2 sets;10 reps    Heel Raises Limitations  toe raises 2x10     Forward Lunges  Right;2 sets;10 reps    Terminal Knee Extension  --    Hip Abduction  Stengthening;Both;2 sets;10 reps;Knee straight    Abduction Limitations  2#    Step Down  Right;Step Height: 2";Hand Hold: 2;20 reps    Rocker Board  3 minutes      Knee/Hip Exercises: Seated   Sit to Sand  2 sets;10 reps      Modalities   Modalities  Electrical Stimulation;Vasopneumatic      Cryotherapy   Number Minutes Cryotherapy  15 Minutes    Cryotherapy Location  Knee    Type of Cryotherapy  Ice pack      Electrical Stimulation   Electrical Stimulation Location  right kne    Electrical Stimulation Action  IFC    Electrical Stimulation Parameters  80-150 hz x15 mins    Electrical Stimulation Goals  Pain                  PT Long Term Goals -  06/25/19 1250      PT LONG TERM GOAL #1   Title  Patient will be independent with HEP    Time  4    Period  Weeks    Status  Achieved      PT LONG TERM GOAL #2   Title  Patient will demonstrate 4/5 or greater right knee MMT in all planes to improve stabilty during functional tasks    Time  4    Period  Weeks    Status  On-going      PT LONG TERM GOAL #3   Title  Patient will report ability to perform ADLs and work activities with right knee pain less than or equal to 3/10.    Time  4    Period  Weeks    Status  On-going      PT LONG TERM GOAL #4   Title  Patient will report ability to walk community distances with right knee pain less than or equal to 3/10.    Time  4    Period  Weeks    Status  On-going      PT LONG TERM GOAL #5   Title  Patient will report ability to stand for 20 minutes or greater with equal weight bearing through bilateral LEs to improve showering and other standing tasks.    Period  Weeks    Status  On-going            Plan - 06/29/19 1022    Clinical Impression Statement  Patient was able to tolerate treatment fairly well but with slight discomfort with the leg press. Patient was able to continue but with rest breaks. Patient discussed how she has been frequently getting calf cramps since her surgery. Ice pack applied as vasonpneumatic device was unavailable. No adverse affects upon removal.    Personal Factors and Comorbidities  Time since onset of injury/illness/exacerbation    Examination-Activity Limitations  Stand;Locomotion Level;Bathing;Hygiene/Grooming    Stability/Clinical Decision Making  Stable/Uncomplicated    Clinical Decision Making  Low    Rehab Potential  Good    PT Frequency  2x /  week    PT Duration  4 weeks    PT Treatment/Interventions  ADLs/Self Care Home Management;Electrical Stimulation;Cryotherapy;Ultrasound;Balance training;Therapeutic exercise;Therapeutic activities;Functional mobility training;Stair training;Gait  training;Neuromuscular re-education;Patient/family education;Passive range of motion;Manual techniques;Vasopneumatic Device;Taping    PT Next Visit Plan  continue with pain free strengthening, STW/M to right hamstrings, modalities PRN for pain relief.    PT Home Exercise Plan  see patient education section    Consulted and Agree with Plan of Care  Patient       Patient will benefit from skilled therapeutic intervention in order to improve the following deficits and impairments:  Decreased activity tolerance, Decreased strength, Decreased balance, Decreased range of motion, Difficulty walking, Pain  Visit Diagnosis: Chronic pain of right knee  Muscle weakness (generalized)  Difficulty in walking, not elsewhere classified     Problem List Patient Active Problem List   Diagnosis Date Noted  . Pain management contract agreement 05/16/2016  . Insomnia 04/13/2015  . BMI 26.0-26.9,adult 04/13/2015  . GAD (generalized anxiety disorder) 05/25/2014  . Hyperlipidemia 11/04/2012  . Migraines 11/04/2012  . GERD (gastroesophageal reflux disease) 11/04/2012  . Fibromyalgia 11/04/2012  . TMJ arthropathy 11/04/2012    Gabriela Eves, PT, DPT 06/29/2019, 10:30 AM  [facility] [address] [place], [place], [address] Phone: [phone]   Fax:  [phone]  Name: BRYLINN TRAFTON MRN: [id number] Date of Birth: 12/15/1960

## 2019-07-02 ENCOUNTER — Encounter: Payer: 59 | Admitting: Physical Therapy

## 2019-07-06 ENCOUNTER — Encounter: Payer: Self-pay | Admitting: Physical Therapy

## 2019-07-06 ENCOUNTER — Ambulatory Visit: Payer: 59 | Admitting: Physical Therapy

## 2019-07-06 ENCOUNTER — Other Ambulatory Visit: Payer: Self-pay

## 2019-07-06 DIAGNOSIS — G8929 Other chronic pain: Secondary | ICD-10-CM

## 2019-07-06 DIAGNOSIS — M25561 Pain in right knee: Secondary | ICD-10-CM | POA: Diagnosis not present

## 2019-07-06 DIAGNOSIS — M6281 Muscle weakness (generalized): Secondary | ICD-10-CM

## 2019-07-06 DIAGNOSIS — R262 Difficulty in walking, not elsewhere classified: Secondary | ICD-10-CM

## 2019-07-06 NOTE — Therapy (Signed)
[facility] [place], [place], [address] Phone: [phone]   Fax:  [phone]  Physical Therapy Treatment  Patient Details  Name: [name] MRN: [id number] Date of Birth: 1960-09-02 Referring Provider (PT): Esmond Plants, MD   Encounter Date: 07/06/2019  PT End of Session - 07/06/19 0950    Visit Number  12    Number of Visits  16    Date for PT Re-Evaluation  07/13/19    Authorization Type  Progress note every 10th visit    PT Start Time  0900    PT Stop Time  0958    PT Time Calculation (min)  58 min    Activity Tolerance  Patient tolerated treatment well    Behavior During Therapy  [facility] for tasks assessed/performed       Past Medical History:  Diagnosis Date  . Arthritis    right hand  . Dyslipidemia   . Family history of anesthesia complication    patients mother has severe nausea and vomiting after  . Fibromyalgia   . GERD (gastroesophageal reflux disease)   . Guillain-Barre syndrome ([place])   . Hiatal hernia   . Kidney stones   . Migraines   . PONV (postoperative nausea and vomiting)   . TMJ (dislocation of temporomandibular joint)   . UTI (urinary tract infection)    hx of  . Vitamin D deficiency     Past Surgical History:  Procedure Laterality Date  . ABDOMINAL HYSTERECTOMY    . ADNOIDS    . ANTERIOR CERVICAL DECOMP/DISCECTOMY FUSION N/A 10/27/2013   Procedure: ACDF/ANTERIOR CERVICAL DECOMPRESSION/DISCECTOMY FUSION  C5-C7  (2 LEVELS);  Surgeon: Melina Schools, MD;  Location: [facility];  Service: Orthopedics;  Laterality: N/A;  . EYE SURGERY Bilateral    cataract removal    There were no vitals filed for this visit.  Subjective Assessment - 07/06/19 0914    Subjective  COVID-19 screening performed upon arrival. Patient reports a flare up of her fibromyalgia that is causing her more discomfort and pain.    Pertinent History  unremarkable    Limitations  Standing;Walking;House hold activities    How long can you stand comfortably?  short time, weight bear mainly on L LE    How long can you walk comfortably?  short distances, toe touch weightbearing    Patient Stated Goals  improve strength for surgery.    Currently in Pain?  Yes   did not provide number on pain scale        [facility] PT Assessment - 07/06/19 0001      Assessment   Medical Diagnosis  right knee pain    Referring Provider (PT)  Esmond Plants, MD    Next MD Visit  February 2021    Prior Therapy  no                   [facility] Adult PT Treatment/Exercise - 07/06/19 0001      Exercises   Exercises  Knee/Hip      Knee/Hip Exercises: Aerobic   Nustep  Level 4 x10 minutes      Knee/Hip Exercises: Machines for Strengthening   Cybex Leg Press  1 plate x20, seat 8      Knee/Hip Exercises: Standing   Forward Lunges  2 sets;10 reps;Both    Rocker Board  3 minutes      Knee/Hip Exercises: Seated   Sit to Sand  2 sets;10 reps  Knee/Hip Exercises: Supine   Bridges  2 sets;10 reps;AROM      Modalities   Modalities  Psychologist, educational Location  right knee    Electrical Stimulation Action  IFC    Electrical Stimulation Parameters  80-150 hz x15 mins    Electrical Stimulation Goals  Pain      Vasopneumatic   Number Minutes Vasopneumatic   15 minutes    Vasopnuematic Location   Knee    Vasopneumatic Pressure  Low    Vasopneumatic Temperature   38                  PT Long Term Goals - 06/25/19 1250      PT LONG TERM GOAL #1   Title  Patient will be independent with HEP    Time  4    Period  Weeks    Status  Achieved      PT LONG TERM GOAL #2   Title  Patient will demonstrate 4/5 or greater right knee MMT in all planes to improve stabilty during functional tasks    Time  4    Period  Weeks    Status  On-going      PT LONG TERM GOAL #3   Title  Patient will report ability to perform ADLs and work  activities with right knee pain less than or equal to 3/10.    Time  4    Period  Weeks    Status  On-going      PT LONG TERM GOAL #4   Title  Patient will report ability to walk community distances with right knee pain less than or equal to 3/10.    Time  4    Period  Weeks    Status  On-going      PT LONG TERM GOAL #5   Title  Patient will report ability to stand for 20 minutes or greater with equal weight bearing through bilateral LEs to improve showering and other standing tasks.    Period  Weeks    Status  On-going            Plan - 07/06/19 [id number]    Clinical Impression Statement  Patient responded fairly well but with discomfort particularly with eccentric exercises. Patient guided through new TEs and was provided rest breaks as necessary to reduce discomfort. Patient requested to practice getting up from the floor as she likes to garden and play with her grandchild. No adverse affects upon removal of modalities.    Personal Factors and Comorbidities  Time since onset of injury/illness/exacerbation    Examination-Activity Limitations  Stand;Locomotion Level;Bathing;Hygiene/Grooming    Stability/Clinical Decision Making  Stable/Uncomplicated    Clinical Decision Making  Low    Rehab Potential  Good    PT Frequency  2x / week    PT Duration  4 weeks    PT Treatment/Interventions  ADLs/Self Care Home Management;Electrical Stimulation;Cryotherapy;Ultrasound;Balance training;Therapeutic exercise;Therapeutic activities;Functional mobility training;Stair training;Gait training;Neuromuscular re-education;Patient/family education;Passive range of motion;Manual techniques;Vasopneumatic Device;Taping    PT Next Visit Plan  Practice getting up from floor, getting in/out of kneeling positions, continue with pain free strengthening, STW/M to right hamstrings, modalities PRN for pain relief.    PT Home Exercise Plan  see patient education section    Consulted and Agree with Plan of Care   Patient       Patient will benefit from skilled therapeutic intervention in order  to improve the following deficits and impairments:  Decreased activity tolerance, Decreased strength, Decreased balance, Decreased range of motion, Difficulty walking, Pain  Visit Diagnosis: Chronic pain of right knee  Muscle weakness (generalized)  Difficulty in walking, not elsewhere classified     Problem List Patient Active Problem List   Diagnosis Date Noted  . Pain management contract agreement 05/16/2016  . Insomnia 04/13/2015  . BMI 26.0-26.9,adult 04/13/2015  . GAD (generalized anxiety disorder) 05/25/2014  . Hyperlipidemia 11/04/2012  . Migraines 11/04/2012  . GERD (gastroesophageal reflux disease) 11/04/2012  . Fibromyalgia 11/04/2012  . TMJ arthropathy 11/04/2012    Gabriela Eves, PT, DPT 07/06/2019, 10:04 AM  [facility] [address] [place], [place], [address] Phone: [phone]   Fax:  [phone]  Name: [name] MRN: [id number] Date of Birth: March 29, 1961

## 2019-07-09 ENCOUNTER — Encounter: Payer: 59 | Admitting: Physical Therapy

## 2019-07-13 ENCOUNTER — Ambulatory Visit: Payer: 59 | Attending: Orthopedic Surgery | Admitting: Physical Therapy

## 2019-07-13 ENCOUNTER — Other Ambulatory Visit: Payer: Self-pay

## 2019-07-13 DIAGNOSIS — G8929 Other chronic pain: Secondary | ICD-10-CM | POA: Insufficient documentation

## 2019-07-13 DIAGNOSIS — M25561 Pain in right knee: Secondary | ICD-10-CM | POA: Insufficient documentation

## 2019-07-13 DIAGNOSIS — M6281 Muscle weakness (generalized): Secondary | ICD-10-CM | POA: Diagnosis present

## 2019-07-13 DIAGNOSIS — R262 Difficulty in walking, not elsewhere classified: Secondary | ICD-10-CM | POA: Diagnosis present

## 2019-07-13 NOTE — Therapy (Signed)
[facility] [place], [place], [address] Phone: [phone]   Fax:  [phone]  Physical Therapy Treatment  Patient Details  Name: [name] MRN: [id number] Date of Birth: 1960/10/22 Referring Provider (PT): Esmond Plants, MD   Encounter Date: 07/13/2019  PT End of Session - 07/13/19 1026    Visit Number  13    Number of Visits  16    Date for PT Re-Evaluation  07/13/19    Authorization Type  Progress note every 10th visit    PT Start Time  0900    PT Stop Time  0957    PT Time Calculation (min)  57 min    Activity Tolerance  Patient tolerated treatment well    Behavior During Therapy  [facility] for tasks assessed/performed       Past Medical History:  Diagnosis Date  . Arthritis    right hand  . Dyslipidemia   . Family history of anesthesia complication    patients mother has severe nausea and vomiting after  . Fibromyalgia   . GERD (gastroesophageal reflux disease)   . Guillain-Barre syndrome ([place])   . Hiatal hernia   . Kidney stones   . Migraines   . PONV (postoperative nausea and vomiting)   . TMJ (dislocation of temporomandibular joint)   . UTI (urinary tract infection)    hx of  . Vitamin D deficiency     Past Surgical History:  Procedure Laterality Date  . ABDOMINAL HYSTERECTOMY    . ADNOIDS    . ANTERIOR CERVICAL DECOMP/DISCECTOMY FUSION N/A 10/27/2013   Procedure: ACDF/ANTERIOR CERVICAL DECOMPRESSION/DISCECTOMY FUSION  C5-C7  (2 LEVELS);  Surgeon: Melina Schools, MD;  Location: [facility];  Service: Orthopedics;  Laterality: N/A;  . EYE SURGERY Bilateral    cataract removal    There were no vitals filed for this visit.  Subjective Assessment - 07/13/19 0909    Subjective  COVID-19 screening performed upon arrival. Patient reports ongoing hamstring tightness    Pertinent History  unremarkable    Limitations  Standing;Walking;House hold activities    How long can you stand comfortably?  short time,  weight bear mainly on L LE    How long can you walk comfortably?  short distances, toe touch weightbearing    Diagnostic tests  MRI: 2 stress fractures (behind patella and in tibia), torn cartilage, 2 cysts    Patient Stated Goals  improve strength for surgery.    Currently in Pain?  Yes   did not provide number on pain scale        [facility] PT Assessment - 07/13/19 0001      Assessment   Medical Diagnosis  right knee pain    Referring Provider (PT)  Esmond Plants, MD    Next MD Visit  February 2021    Prior Therapy  no                   [facility] Adult PT Treatment/Exercise - 07/13/19 0001      Therapeutic Activites    Therapeutic Activities  Other Therapeutic Activities    Other Therapeutic Activities  stand to seated on the floor transfers, half kneeling to standing      Exercises   Exercises  Knee/Hip      Knee/Hip Exercises: Aerobic   Nustep  Level 4 x10 minutes      Knee/Hip Exercises: Machines for Strengthening   Cybex Knee Extension  10# 2x10    Cybex Knee  Flexion  20# 2x10    Cybex Leg Press  1 plate x20, seat 8      Knee/Hip Exercises: Standing   Rocker Board  3 minutes      Knee/Hip Exercises: Seated   Sit to Sand  2 sets;10 reps      Modalities   Modalities  Psychologist, educational Location  right knee    Electrical Stimulation Action  IFC    Electrical Stimulation Parameters  80-150 hz x15 mins    Electrical Stimulation Goals  Pain      Vasopneumatic   Number Minutes Vasopneumatic   15 minutes    Vasopnuematic Location   Knee    Vasopneumatic Pressure  Low    Vasopneumatic Temperature   40                  PT Long Term Goals - 06/25/19 1250      PT LONG TERM GOAL #1   Title  Patient will be independent with HEP    Time  4    Period  Weeks    Status  Achieved      PT LONG TERM GOAL #2   Title  Patient will demonstrate 4/5 or greater right knee MMT in all  planes to improve stabilty during functional tasks    Time  4    Period  Weeks    Status  On-going      PT LONG TERM GOAL #3   Title  Patient will report ability to perform ADLs and work activities with right knee pain less than or equal to 3/10.    Time  4    Period  Weeks    Status  On-going      PT LONG TERM GOAL #4   Title  Patient will report ability to walk community distances with right knee pain less than or equal to 3/10.    Time  4    Period  Weeks    Status  On-going      PT LONG TERM GOAL #5   Title  Patient will report ability to stand for 20 minutes or greater with equal weight bearing through bilateral LEs to improve showering and other standing tasks.    Period  Weeks    Status  On-going            Plan - 07/13/19 1034    Clinical Impression Statement  Patient responded well to therapy session but with ongoing tightness in medial hamstring tendons. Patient guided through transfers from standing to sitting on the floor as she wants to continue gardening and play with her grandson. Patient educated on TPR to medial hamstring and IASTM to reduce tightness. Patient reported understanding. No adverse affects upon removal of modalities.    Personal Factors and Comorbidities  Time since onset of injury/illness/exacerbation    Examination-Activity Limitations  Stand;Locomotion Level;Bathing;Hygiene/Grooming    Stability/Clinical Decision Making  Stable/Uncomplicated    Clinical Decision Making  Low    Rehab Potential  Good    PT Frequency  2x / week    PT Duration  4 weeks    PT Treatment/Interventions  ADLs/Self Care Home Management;Electrical Stimulation;Cryotherapy;Ultrasound;Balance training;Therapeutic exercise;Therapeutic activities;Functional mobility training;Stair training;Gait training;Neuromuscular re-education;Patient/family education;Passive range of motion;Manual techniques;Vasopneumatic Device;Taping    PT Next Visit Plan  Practice getting up from floor,  getting in/out of kneeling positions, continue with pain free strengthening, STW/M to right hamstrings,  modalities PRN for pain relief.    PT Home Exercise Plan  see patient education section    Consulted and Agree with Plan of Care  Patient       Patient will benefit from skilled therapeutic intervention in order to improve the following deficits and impairments:  Decreased activity tolerance, Decreased strength, Decreased balance, Decreased range of motion, Difficulty walking, Pain  Visit Diagnosis: Chronic pain of right knee  Muscle weakness (generalized)  Difficulty in walking, not elsewhere classified     Problem List Patient Active Problem List   Diagnosis Date Noted  . Pain management contract agreement 05/16/2016  . Insomnia 04/13/2015  . BMI 26.0-26.9,adult 04/13/2015  . GAD (generalized anxiety disorder) 05/25/2014  . Hyperlipidemia 11/04/2012  . Migraines 11/04/2012  . GERD (gastroesophageal reflux disease) 11/04/2012  . Fibromyalgia 11/04/2012  . TMJ arthropathy 11/04/2012    Gabriela Eves, PT, DPT 07/13/2019, 10:38 AM  [facility] [address] [place], [place], [address] Phone: [phone]   Fax:  [phone]  Name: [name] MRN: [id number] Date of Birth: 1961-03-03

## 2019-07-16 ENCOUNTER — Ambulatory Visit: Payer: 59 | Admitting: Physical Therapy

## 2019-07-16 ENCOUNTER — Encounter: Payer: Self-pay | Admitting: Physical Therapy

## 2019-07-16 ENCOUNTER — Other Ambulatory Visit: Payer: Self-pay

## 2019-07-16 DIAGNOSIS — M6281 Muscle weakness (generalized): Secondary | ICD-10-CM

## 2019-07-16 DIAGNOSIS — G8929 Other chronic pain: Secondary | ICD-10-CM

## 2019-07-16 DIAGNOSIS — R262 Difficulty in walking, not elsewhere classified: Secondary | ICD-10-CM

## 2019-07-16 DIAGNOSIS — M25561 Pain in right knee: Secondary | ICD-10-CM | POA: Diagnosis not present

## 2019-07-16 NOTE — Therapy (Signed)
[facility] [place], [place], [address] Phone: [phone]   Fax:  [phone]  Physical Therapy Treatment  Patient Details  Name: [name] MRN: [id number] Date of Birth: [birth year] Referring Provider (PT): Esmond Plants, MD   Encounter Date: 07/16/2019  PT End of Session - 07/16/19 0924    Visit Number  14    Number of Visits  16    Date for PT Re-Evaluation  07/13/19    Authorization Type  Progress note every 10th visit    PT Start Time  0900    PT Stop Time  0958    PT Time Calculation (min)  58 min    Activity Tolerance  Patient tolerated treatment well    Behavior During Therapy  [facility] for tasks assessed/performed       Past Medical History:  Diagnosis Date  . Arthritis    right hand  . Dyslipidemia   . Family history of anesthesia complication    patients mother has severe nausea and vomiting after  . Fibromyalgia   . GERD (gastroesophageal reflux disease)   . Guillain-Barre syndrome ([place])   . Hiatal hernia   . Kidney stones   . Migraines   . PONV (postoperative nausea and vomiting)   . TMJ (dislocation of temporomandibular joint)   . UTI (urinary tract infection)    hx of  . Vitamin D deficiency     Past Surgical History:  Procedure Laterality Date  . ABDOMINAL HYSTERECTOMY    . ADNOIDS    . ANTERIOR CERVICAL DECOMP/DISCECTOMY FUSION N/A 10/27/2013   Procedure: ACDF/ANTERIOR CERVICAL DECOMPRESSION/DISCECTOMY FUSION  C5-C7  (2 LEVELS);  Surgeon: Melina Schools, MD;  Location: [facility];  Service: Orthopedics;  Laterality: N/A;  . EYE SURGERY Bilateral    cataract removal    There were no vitals filed for this visit.  Subjective Assessment - 07/16/19 0911    Subjective  COVID-19 screening performed upon arrival. Patient reported feeling good today.    Pertinent History  unremarkable    Limitations  Standing;Walking;House hold activities    How long can you stand comfortably?  short time, weight  bear mainly on L LE    How long can you walk comfortably?  short distances, toe touch weightbearing    Diagnostic tests  MRI: 2 stress fractures (behind patella and in tibia), torn cartilage, 2 cysts    Patient Stated Goals  improve strength for surgery.    Currently in Pain?  No/denies         [facility] PT Assessment - 07/16/19 0001      Assessment   Medical Diagnosis  right knee pain    Referring Provider (PT)  Esmond Plants, MD    Next MD Visit  February 2021    Prior Therapy  no                   [facility] Adult PT Treatment/Exercise - 07/16/19 0001      Exercises   Exercises  Knee/Hip      Knee/Hip Exercises: Aerobic   Nustep  Level 4 x10 minutes      Knee/Hip Exercises: Machines for Strengthening   Cybex Leg Press  2 plates x20, seat 8      Knee/Hip Exercises: Standing   Lateral Step Up  Right;2 sets;10 reps;Hand Hold: 2;Step Height: 8"    Forward Step Up  Right;2 sets;10 reps;Hand Hold: 1;Step Height: 8"    Rocker Board  3 minutes  Other Standing Knee Exercises  Lateral stepping down carpeted hallway x2 red theraband    Other Standing Knee Exercises  resisted walking forward and lateral x10 each      Modalities   Modalities  Electrical Stimulation;Vasopneumatic      Electrical Stimulation   Electrical Stimulation Location  right knee    Electrical Stimulation Action  IFC    Electrical Stimulation Parameters  80-150 hz x15 mins    Electrical Stimulation Goals  Pain      Vasopneumatic   Number Minutes Vasopneumatic   15 minutes    Vasopnuematic Location   Knee    Vasopneumatic Pressure  Low    Vasopneumatic Temperature   40                  PT Long Term Goals - 06/25/19 1250      PT LONG TERM GOAL #1   Title  Patient will be independent with HEP    Time  4    Period  Weeks    Status  Achieved      PT LONG TERM GOAL #2   Title  Patient will demonstrate 4/5 or greater right knee MMT in all planes to improve stabilty during functional  tasks    Time  4    Period  Weeks    Status  On-going      PT LONG TERM GOAL #3   Title  Patient will report ability to perform ADLs and work activities with right knee pain less than or equal to 3/10.    Time  4    Period  Weeks    Status  On-going      PT LONG TERM GOAL #4   Title  Patient will report ability to walk community distances with right knee pain less than or equal to 3/10.    Time  4    Period  Weeks    Status  On-going      PT LONG TERM GOAL #5   Title  Patient will report ability to stand for 20 minutes or greater with equal weight bearing through bilateral LEs to improve showering and other standing tasks.    Period  Weeks    Status  On-going            Plan - 07/16/19 0925    Clinical Impression Statement  Patient was able to tolerate treatment very well with no reports of increased pain. Patient was able to tolerate the progression of TEs and strengthening exercises with no complaints. Patient has been compliant with HEP and the addition of STW/M and IASTM to right medial hamstring. Patient informed of 2 visits remaining in plan of care. Patient reported understanding and is ready for discharge. No adverse affects upon removal of modalities.    Personal Factors and Comorbidities  Time since onset of injury/illness/exacerbation    Examination-Activity Limitations  Stand;Locomotion Level;Bathing;Hygiene/Grooming    Stability/Clinical Decision Making  Stable/Uncomplicated    Clinical Decision Making  Low    Rehab Potential  Good    PT Frequency  2x / week    PT Duration  4 weeks    PT Treatment/Interventions  ADLs/Self Care Home Management;Electrical Stimulation;Cryotherapy;Ultrasound;Balance training;Therapeutic exercise;Therapeutic activities;Functional mobility training;Stair training;Gait training;Neuromuscular re-education;Patient/family education;Passive range of motion;Manual techniques;Vasopneumatic Device;Taping    PT Next Visit Plan  Practice getting  up from floor, getting in/out of kneeling positions, continue with pain free strengthening, STW/M to right hamstrings, modalities PRN for pain relief.  PT Home Exercise Plan  see patient education section    Consulted and Agree with Plan of Care  Patient       Patient will benefit from skilled therapeutic intervention in order to improve the following deficits and impairments:  Decreased activity tolerance, Decreased strength, Decreased balance, Decreased range of motion, Difficulty walking, Pain  Visit Diagnosis: Chronic pain of right knee  Muscle weakness (generalized)  Difficulty in walking, not elsewhere classified     Problem List Patient Active Problem List   Diagnosis Date Noted  . Pain management contract agreement 05/16/2016  . Insomnia 04/13/2015  . BMI 26.0-26.9,adult 04/13/2015  . GAD (generalized anxiety disorder) 05/25/2014  . Hyperlipidemia 11/04/2012  . Migraines 11/04/2012  . GERD (gastroesophageal reflux disease) 11/04/2012  . Fibromyalgia 11/04/2012  . TMJ arthropathy 11/04/2012    Gabriela Eves, PT, DPT 07/16/2019, 1:05 PM  [facility] [address] [place], [place], [address] Phone: [phone]   Fax:  [phone]  Name: [name] MRN: [id number] Date of Birth: January 13, 1961

## 2019-07-23 ENCOUNTER — Encounter: Payer: Self-pay | Admitting: Physical Therapy

## 2019-07-23 ENCOUNTER — Ambulatory Visit: Payer: 59 | Admitting: Physical Therapy

## 2019-07-23 ENCOUNTER — Other Ambulatory Visit: Payer: Self-pay

## 2019-07-23 DIAGNOSIS — M25561 Pain in right knee: Secondary | ICD-10-CM | POA: Diagnosis not present

## 2019-07-23 DIAGNOSIS — M6281 Muscle weakness (generalized): Secondary | ICD-10-CM

## 2019-07-23 DIAGNOSIS — R262 Difficulty in walking, not elsewhere classified: Secondary | ICD-10-CM

## 2019-07-23 DIAGNOSIS — G8929 Other chronic pain: Secondary | ICD-10-CM

## 2019-07-23 NOTE — Therapy (Signed)
[facility] [place], [place], [address] Phone: [phone]   Fax:  [phone]  Physical Therapy Treatment  Patient Details  Name: [name] MRN: [id number] Date of Birth: Dec 21, 1960 Referring Provider (PT): Esmond Plants, MD   Encounter Date: 07/23/2019  PT End of Session - 07/23/19 0956    Visit Number  15    Number of Visits  16    Date for PT Re-Evaluation  07/13/19    Authorization Type  Progress note every 10th visit    PT Start Time  0900    PT Stop Time  0940    PT Time Calculation (min)  40 min    Activity Tolerance  Patient tolerated treatment well    Behavior During Therapy  [facility] for tasks assessed/performed       Past Medical History:  Diagnosis Date  . Arthritis    right hand  . Dyslipidemia   . Family history of anesthesia complication    patients mother has severe nausea and vomiting after  . Fibromyalgia   . GERD (gastroesophageal reflux disease)   . Guillain-Barre syndrome ([place])   . Hiatal hernia   . Kidney stones   . Migraines   . PONV (postoperative nausea and vomiting)   . TMJ (dislocation of temporomandibular joint)   . UTI (urinary tract infection)    hx of  . Vitamin D deficiency     Past Surgical History:  Procedure Laterality Date  . ABDOMINAL HYSTERECTOMY    . ADNOIDS    . ANTERIOR CERVICAL DECOMP/DISCECTOMY FUSION N/A 10/27/2013   Procedure: ACDF/ANTERIOR CERVICAL DECOMPRESSION/DISCECTOMY FUSION  C5-C7  (2 LEVELS);  Surgeon: Melina Schools, MD;  Location: [facility];  Service: Orthopedics;  Laterality: N/A;  . EYE SURGERY Bilateral    cataract removal    There were no vitals filed for this visit.  Subjective Assessment - 07/23/19 0933    Subjective  COVID-19 screening performed upon arrival. Patient reports her fibromyalgia has flared up and she is not having a good day.    Pertinent History  unremarkable    Limitations  Standing;Walking;House hold activities    How long can  you stand comfortably?  short time, weight bear mainly on L LE    How long can you walk comfortably?  short distances, toe touch weightbearing    Diagnostic tests  MRI: 2 stress fractures (behind patella and in tibia), torn cartilage, 2 cysts    Patient Stated Goals  improve strength for surgery.    Currently in Pain?  Yes   "pain all over"        [facility] PT Assessment - 07/23/19 0001      Assessment   Medical Diagnosis  right knee pain    Referring Provider (PT)  Esmond Plants, MD    Next MD Visit  February 2021    Prior Therapy  no                   [facility] Adult PT Treatment/Exercise - 07/23/19 0001      Exercises   Exercises  Knee/Hip      Knee/Hip Exercises: Aerobic   Nustep  Level 4 x10 minutes      Knee/Hip Exercises: Machines for Strengthening   Cybex Leg Press  1.5 plate x20, seat 8      Modalities   Modalities  Electrical Stimulation;Vasopneumatic      Electrical Stimulation   Electrical Stimulation Location  right knee  Electrical Stimulation Action  IFC    Electrical Stimulation Parameters  80-150 hz x20 mins    Electrical Stimulation Goals  Pain      Vasopneumatic   Number Minutes Vasopneumatic   20 minutes    Vasopnuematic Location   Knee    Vasopneumatic Pressure  Low    Vasopneumatic Temperature   40                   PT Long Term Goals - 06/25/19 1250      PT LONG TERM GOAL #1   Title  Patient will be independent with HEP    Time  4    Period  Weeks    Status  Achieved      PT LONG TERM GOAL #2   Title  Patient will demonstrate 4/5 or greater right knee MMT in all planes to improve stabilty during functional tasks    Time  4    Period  Weeks    Status  On-going      PT LONG TERM GOAL #3   Title  Patient will report ability to perform ADLs and work activities with right knee pain less than or equal to 3/10.    Time  4    Period  Weeks    Status  On-going      PT LONG TERM GOAL #4   Title  Patient will report  ability to walk community distances with right knee pain less than or equal to 3/10.    Time  4    Period  Weeks    Status  On-going      PT LONG TERM GOAL #5   Title  Patient will report ability to stand for 20 minutes or greater with equal weight bearing through bilateral LEs to improve showering and other standing tasks.    Period  Weeks    Status  On-going            Plan - 07/23/19 0944    Clinical Impression Statement  Patient arrives feeling unwell due to fibromyalgia. Patient only able to tolerate nustep and leg press today. Patient requested to fininsh with vasopneumatic and electrical stimulation.    Personal Factors and Comorbidities  Time since onset of injury/illness/exacerbation    Examination-Activity Limitations  Stand;Locomotion Level;Bathing;Hygiene/Grooming    Stability/Clinical Decision Making  Stable/Uncomplicated    Clinical Decision Making  Low    Rehab Potential  Good    PT Frequency  2x / week    PT Duration  4 weeks    PT Treatment/Interventions  ADLs/Self Care Home Management;Electrical Stimulation;Cryotherapy;Ultrasound;Balance training;Therapeutic exercise;Therapeutic activities;Functional mobility training;Stair training;Gait training;Neuromuscular re-education;Patient/family education;Passive range of motion;Manual techniques;Vasopneumatic Device;Taping    PT Next Visit Plan  DC, goals    PT Home Exercise Plan  see patient education section    Consulted and Agree with Plan of Care  Patient       Patient will benefit from skilled therapeutic intervention in order to improve the following deficits and impairments:  Decreased activity tolerance, Decreased strength, Decreased balance, Decreased range of motion, Difficulty walking, Pain  Visit Diagnosis: Chronic pain of right knee  Muscle weakness (generalized)  Difficulty in walking, not elsewhere classified     Problem List Patient Active Problem List   Diagnosis Date Noted  . Pain  management contract agreement 05/16/2016  . Insomnia 04/13/2015  . BMI 26.0-26.9,adult 04/13/2015  . GAD (generalized anxiety disorder) 05/25/2014  . Hyperlipidemia 11/04/2012  . Migraines 11/04/2012  .  GERD (gastroesophageal reflux disease) 11/04/2012  . Fibromyalgia 11/04/2012  . TMJ arthropathy 11/04/2012    Gabriela Eves, PT, DPT 07/23/2019, 10:00 AM  [facility] [address] [place], [place], [address] Phone: [phone]   Fax:  [phone]  Name: [name] MRN: [id number] Date of Birth: 04-21-1961

## 2019-07-27 ENCOUNTER — Other Ambulatory Visit: Payer: Self-pay

## 2019-07-27 ENCOUNTER — Ambulatory Visit: Payer: 59 | Admitting: Physical Therapy

## 2019-07-27 ENCOUNTER — Encounter: Payer: Self-pay | Admitting: Physical Therapy

## 2019-07-27 DIAGNOSIS — R262 Difficulty in walking, not elsewhere classified: Secondary | ICD-10-CM

## 2019-07-27 DIAGNOSIS — M6281 Muscle weakness (generalized): Secondary | ICD-10-CM

## 2019-07-27 DIAGNOSIS — G8929 Other chronic pain: Secondary | ICD-10-CM

## 2019-07-27 DIAGNOSIS — M25561 Pain in right knee: Secondary | ICD-10-CM | POA: Diagnosis not present

## 2019-07-27 NOTE — Therapy (Signed)
[facility] [place], [place], [address] Phone: [phone]   Fax:  [phone]  Physical Therapy Treatment PHYSICAL THERAPY DISCHARGE SUMMARY  Visits from Start of Care: 16  Current functional level related to goals / functional outcomes: See below   Remaining deficits: All goals met   Education / Equipment: HEP Plan: Patient agrees to discharge.  Patient goals were met. Patient is being discharged due to meeting the stated rehab goals.  ?????      Patient Details  Name: [name] MRN: [id number] Date of Birth: 1960-06-22 Referring Provider (PT): Esmond Plants, MD   Encounter Date: 07/27/2019  PT End of Session - 07/27/19 0956    Visit Number  16    Number of Visits  16    Date for PT Re-Evaluation  07/13/19    Authorization Type  Progress note every 10th visit    PT Start Time  0900    PT Stop Time  0956    PT Time Calculation (min)  56 min    Activity Tolerance  Patient tolerated treatment well    Behavior During Therapy  [facility] for tasks assessed/performed       Past Medical History:  Diagnosis Date  . Arthritis    right hand  . Dyslipidemia   . Family history of anesthesia complication    patients mother has severe nausea and vomiting after  . Fibromyalgia   . GERD (gastroesophageal reflux disease)   . Guillain-Barre syndrome ([place])   . Hiatal hernia   . Kidney stones   . Migraines   . PONV (postoperative nausea and vomiting)   . TMJ (dislocation of temporomandibular joint)   . UTI (urinary tract infection)    hx of  . Vitamin D deficiency     Past Surgical History:  Procedure Laterality Date  . ABDOMINAL HYSTERECTOMY    . ADNOIDS    . ANTERIOR CERVICAL DECOMP/DISCECTOMY FUSION N/A 10/27/2013   Procedure: ACDF/ANTERIOR CERVICAL DECOMPRESSION/DISCECTOMY FUSION  C5-C7  (2 LEVELS);  Surgeon: Melina Schools, MD;  Location: [facility];  Service: Orthopedics;  Laterality: N/A;  . EYE SURGERY  Bilateral    cataract removal    There were no vitals filed for this visit.      [facility] PT Assessment - 07/27/19 0001      Assessment   Medical Diagnosis  right knee pain    Referring Provider (PT)  Esmond Plants, MD    Next MD Visit  February 2021    Prior Therapy  no                   [facility] Adult PT Treatment/Exercise - 07/27/19 0001      Exercises   Exercises  Knee/Hip      Knee/Hip Exercises: Aerobic   Nustep  Level 4 x10 minutes      Knee/Hip Exercises: Machines for Strengthening   Cybex Leg Press  1.5 plate x20, seat 8      Knee/Hip Exercises: Standing   Hip Extension  AROM;Both;2 sets;10 reps    Rocker Board  3 minutes    Other Standing Knee Exercises  Lateral stepping down hallway x2 red theraband      Knee/Hip Exercises: Supine   Bridges  Strengthening;2 sets;10 reps      Modalities   Modalities  Psychologist, educational Location  right knee    Electrical Stimulation Action  IFC  Electrical Stimulation Parameters  80-150 hz x15 mins    Electrical Stimulation Goals  Edema      Vasopneumatic   Number Minutes Vasopneumatic   15 minutes    Vasopnuematic Location   Knee    Vasopneumatic Pressure  Low    Vasopneumatic Temperature   34                  PT Long Term Goals - 07/27/19 0931      PT LONG TERM GOAL #1   Title  Patient will be independent with HEP    Time  4    Period  Weeks    Status  Achieved      PT LONG TERM GOAL #2   Title  Patient will demonstrate 4/5 or greater right knee MMT in all planes to improve stabilty during functional tasks    Time  4    Period  Weeks    Status  Achieved      PT LONG TERM GOAL #3   Title  Patient will report ability to perform ADLs and work activities with right knee pain less than or equal to 3/10.    Time  4    Period  Weeks    Status  Achieved      PT LONG TERM GOAL #4   Title  Patient will report ability to  walk community distances with right knee pain less than or equal to 3/10.    Time  4    Period  Weeks    Status  Achieved      PT LONG TERM GOAL #5   Title  Patient will report ability to stand for 20 minutes or greater with equal weight bearing through bilateral LEs to improve showering and other standing tasks.    Time  4    Period  Weeks    Status  Achieved            Plan - 07/27/19 0956    Clinical Impression Statement  Patient responded well to therapy session and reviewed HEP. Patient has met all goals but did report slight increase of right joint line discomfort with knee extension MMT. Patient provided handout for home TENs unit and instructed to call if any questions. Patient to be discharged today.    Personal Factors and Comorbidities  Time since onset of injury/illness/exacerbation    Examination-Activity Limitations  Stand;Locomotion Level;Bathing;Hygiene/Grooming    Stability/Clinical Decision Making  Stable/Uncomplicated    Clinical Decision Making  Low    Rehab Potential  Good    PT Frequency  2x / week    PT Duration  4 weeks    PT Treatment/Interventions  ADLs/Self Care Home Management;Electrical Stimulation;Cryotherapy;Ultrasound;Balance training;Therapeutic exercise;Therapeutic activities;Functional mobility training;Stair training;Gait training;Neuromuscular re-education;Patient/family education;Passive range of motion;Manual techniques;Vasopneumatic Device;Taping    PT Next Visit Plan  DC    PT Home Exercise Plan  see patient education section    Consulted and Agree with Plan of Care  Patient       Patient will benefit from skilled therapeutic intervention in order to improve the following deficits and impairments:  Decreased activity tolerance, Decreased strength, Decreased balance, Decreased range of motion, Difficulty walking, Pain  Visit Diagnosis: Chronic pain of right knee  Muscle weakness (generalized)  Difficulty in walking, not elsewhere  classified     Problem List Patient Active Problem List   Diagnosis Date Noted  . Pain management contract agreement 05/16/2016  . Insomnia 04/13/2015  . BMI  26.0-26.9,adult 04/13/2015  . GAD (generalized anxiety disorder) 05/25/2014  . Hyperlipidemia 11/04/2012  . Migraines 11/04/2012  . GERD (gastroesophageal reflux disease) 11/04/2012  . Fibromyalgia 11/04/2012  . TMJ arthropathy 11/04/2012    Gabriela Eves, PT, DPT 07/27/2019, 10:06 AM  [facility] [address] [place], [place], [address] Phone: [phone]   Fax:  [phone]  Name: [name] MRN: [id number] Date of Birth: Sep 22, 1960

## 2019-07-30 ENCOUNTER — Encounter: Payer: 59 | Admitting: Physical Therapy

## 2019-08-02 ENCOUNTER — Other Ambulatory Visit: Payer: Self-pay

## 2019-08-03 ENCOUNTER — Ambulatory Visit: Payer: 59 | Admitting: Nurse Practitioner

## 2019-08-03 ENCOUNTER — Encounter: Payer: Self-pay | Admitting: Nurse Practitioner

## 2019-08-03 VITALS — BP 115/73 | HR 99 | Temp 96.8°F | Resp 20 | Ht 68.0 in | Wt 184.0 lb

## 2019-08-03 DIAGNOSIS — T63301A Toxic effect of unspecified spider venom, accidental (unintentional), initial encounter: Secondary | ICD-10-CM

## 2019-08-03 MED ORDER — CEPHALEXIN 500 MG PO CAPS: 500.0000 mg | ORAL_CAPSULE | Freq: Three times a day (TID) | ORAL | 0 refills | Status: DC

## 2019-08-03 NOTE — Patient Instructions (Signed)
Spider Bite Spider bites are not common. Most spider bites do not cause serious problems. There are only a few types of spider bites that can cause serious health problems. What are the causes? This condition is caused when you make contact with a spider in a way that traps the spider against your skin. What are the signs or symptoms? Some spider bites may cause symptoms within 1 hour after the bite. For other spider bites, it may take 1-2 days for symptoms to appear. Symptoms include:  A raised area that is red.  Redness and swelling around the area of the bite.  Pain in the area of the bite. A few types of spiders, such as the black widow spider or the brown recluse spider, can inject poison (venom) into a bite wound. This causes more serious symptoms. Symptoms of these bites vary, and may include:  Muscle cramps.  Feeling sick to your stomach (nauseous).  Throwing up (vomiting).  Pain in your belly (abdomen).  A fever.  A skin sore (lesion) that spreads. This can break into an open wound (skin ulcer).  Feeling light-headed or dizzy. How is this treated? Many spider bites do not need treatment. If needed, treatment may include:  Icing and keeping the bite area raised (elevated).  Taking or applying over-the-counter or prescription medicines to help with symptoms such as pain and itching.  Having a tetanus shot.  Taking antibiotic medicine. Follow these instructions at home: Medicines  Take or apply over-the-counter and prescription medicines only as told by your doctor.  If you were prescribed an antibiotic medicine, take it as told by your doctor. Do not stop using it even if you start to feel better. Managing pain and swelling   If told, put ice on the bite area. ? Put ice in a plastic bag. ? Place a towel between your skin and the bag. ? Leave the ice on for 20 minutes, 2-3 times a day.  Raise the bite area above the level of your heart while you are sitting or  lying down. General instructions   Do not scratch the bite area.  Keep the bite area clean and dry. Wash the bite area with soap and water each day as told by your doctor.  Keep all follow-up visits as told by your doctor. This is important. Contact a doctor if:  Your bite does not get better after 3 days.  Your bite turns black or purple.  Near the bite, you have more: ? Redness. ? Swelling. ? Pain. Get help right away if:  You get shortness of breath or chest pain.  You have fluid, blood, or pus coming from the bite area.  You have painful muscle cramps or sudden muscle tightening (spasms).  You have belly pain.  You feel sick to your stomach or you throw up.  You feel more tired or sleepy than normal. Summary  Spider bites are not common. When spider bites do happen, most do not cause serious health problems.  Take or apply all medicines only as told by your doctor.  Keep the bite area clean and dry. Wash the bite area with soap and water each day as told by your doctor.  Contact a doctor if you have more redness, swelling, or pain near the bite.  Get help right away if you get shortness of breath or chest pain. This information is not intended to replace advice given to you by your health care provider. Make sure you discuss any  questions you have with your health care provider. °Document Revised: 01/06/2018 Document Reviewed: 01/06/2018 °Elsevier Patient Education © 2020 [employer] Inc. ° °

## 2019-08-03 NOTE — Progress Notes (Signed)
Subjective:    Patient ID: [name], female    DOB: Aug 02, 1960, 59 y.o.   MRN: [id number]   Chief Complaint: Insect Bite (x 3 face/ neck (first noticed Thursday))   HPI Patient comes in with bites on her neck. Noticed it Thursday, started as a little pimple like area. Has 2 more spots now. First spot is larger, red and hard.   Review of Systems  Constitutional: Negative for diaphoresis.  Eyes: Negative for pain.  Respiratory: Negative for shortness of breath.   Cardiovascular: Negative for chest pain, palpitations and leg swelling.  Gastrointestinal: Negative for abdominal pain.  Endocrine: Negative for polydipsia.  Skin: Negative for rash.  Neurological: Negative for dizziness, weakness and headaches.  Hematological: Does not bruise/bleed easily.  All other systems reviewed and are negative.      Objective:   Physical Exam Vitals and nursing note reviewed.  Constitutional:      General: She is not in acute distress.    Appearance: Normal appearance. She is well-developed.  HENT:     Head: Normocephalic.     Nose: Nose normal.  Eyes:     Pupils: Pupils are equal, round, and reactive to light.  Neck:     Vascular: No carotid bruit or JVD.  Cardiovascular:     Rate and Rhythm: Normal rate and regular rhythm.     Heart sounds: Normal heart sounds.  Pulmonary:     Effort: Pulmonary effort is normal. No respiratory distress.     Breath sounds: Normal breath sounds. No wheezing or rales.  Chest:     Chest wall: No tenderness.  Abdominal:     General: Bowel sounds are normal. There is no distension or abdominal bruit.     Palpations: Abdomen is soft. There is no hepatomegaly, splenomegaly, mass or pulsatile mass.     Tenderness: There is no abdominal tenderness.  Musculoskeletal:        General: Normal range of motion.     Cervical back: Normal range of motion and neck supple.  Lymphadenopathy:     Cervical: No cervical adenopathy.  Skin:    General: Skin is  warm and dry.  Neurological:     Mental Status: She is alert and oriented to person, place, and time.     Deep Tendon Reflexes: Reflexes are normal and symmetric.  Psychiatric:        Behavior: Behavior normal.        Thought Content: Thought content normal.        Judgment: Judgment normal.    BP 115/73   Pulse 99   Temp (!) 96.8 F (36 C)   Resp 20   Ht 5\' 8"  (1.727 m)   Wt 184 lb (83.5 kg)   SpO2 100%   BMI 27.98 kg/m         Assessment & Plan:  [name] in today with chief complaint of Insect Bite (x 3 face/ neck (first noticed Thursday))   1. Spider bite wound, accidental or unintentional, initial encounter Avoid picking at area Benadryl for itching RTO prn  - cephALEXin (KEFLEX) 500 MG capsule; Take 1 capsule (500 mg total) by mouth 3 (three) times daily.  Dispense: 30 capsule; Refill: 0    The above assessment and management plan was discussed with the patient. The patient verbalized understanding of and has agreed to the management plan. Patient is aware to call the clinic if symptoms persist or worsen. Patient is aware when to return  to the clinic for a follow-up visit. Patient educated on when it is appropriate to go to the emergency department.   Mary-Margaret Hassell Done, FNP

## 2019-08-11 ENCOUNTER — Other Ambulatory Visit: Payer: Self-pay

## 2019-08-12 ENCOUNTER — Encounter: Payer: Self-pay | Admitting: Nurse Practitioner

## 2019-08-12 ENCOUNTER — Ambulatory Visit (INDEPENDENT_AMBULATORY_CARE_PROVIDER_SITE_OTHER): Payer: 59

## 2019-08-12 ENCOUNTER — Ambulatory Visit (INDEPENDENT_AMBULATORY_CARE_PROVIDER_SITE_OTHER): Payer: 59 | Admitting: Nurse Practitioner

## 2019-08-12 VITALS — BP 124/78 | HR 97 | Temp 99.3°F | Resp 20 | Ht 68.0 in | Wt 179.0 lb

## 2019-08-12 DIAGNOSIS — Z0289 Encounter for other administrative examinations: Secondary | ICD-10-CM

## 2019-08-12 DIAGNOSIS — K219 Gastro-esophageal reflux disease without esophagitis: Secondary | ICD-10-CM

## 2019-08-12 DIAGNOSIS — E7800 Pure hypercholesterolemia, unspecified: Secondary | ICD-10-CM

## 2019-08-12 DIAGNOSIS — N951 Menopausal and female climacteric states: Secondary | ICD-10-CM

## 2019-08-12 DIAGNOSIS — F411 Generalized anxiety disorder: Secondary | ICD-10-CM

## 2019-08-12 DIAGNOSIS — Z0001 Encounter for general adult medical examination with abnormal findings: Secondary | ICD-10-CM

## 2019-08-12 DIAGNOSIS — F5101 Primary insomnia: Secondary | ICD-10-CM

## 2019-08-12 DIAGNOSIS — Z87898 Personal history of other specified conditions: Secondary | ICD-10-CM

## 2019-08-12 DIAGNOSIS — Z01419 Encounter for gynecological examination (general) (routine) without abnormal findings: Secondary | ICD-10-CM

## 2019-08-12 DIAGNOSIS — Z0000 Encounter for general adult medical examination without abnormal findings: Secondary | ICD-10-CM

## 2019-08-12 DIAGNOSIS — M797 Fibromyalgia: Secondary | ICD-10-CM

## 2019-08-12 DIAGNOSIS — Z6826 Body mass index (BMI) 26.0-26.9, adult: Secondary | ICD-10-CM

## 2019-08-12 MED ORDER — ESTRADIOL 1 MG PO TABS: 1.0000 mg | ORAL_TABLET | Freq: Every day | ORAL | 1 refills | Status: DC

## 2019-08-12 MED ORDER — HYDROCODONE-ACETAMINOPHEN 10-325 MG PO TABS: 1.0000 | ORAL_TABLET | Freq: Three times a day (TID) | ORAL | 0 refills | Status: DC

## 2019-08-12 NOTE — Addendum Note (Signed)
Addended by: Rolena Infante on: 08/12/2019 10:40 AM   Modules accepted: Orders

## 2019-08-12 NOTE — Progress Notes (Signed)
Subjective:    Patient ID: [name], female    DOB: [birth year], 59 y.o.   MRN: [id number]   Chief Complaint: Annual Exam    HPI:  1. Annual physical exam Needs PAP as well  2. Pure hypercholesterolemia Does try to watch diet. Does not do much exercise. Lab Results  Component Value Date   CHOL 120 10/02/2017   HDL 43 10/02/2017   LDLCALC 58 10/02/2017   TRIG 96 10/02/2017   CHOLHDL 2.8 10/02/2017     3. GAD (generalized anxiety disorder) Has history of anxiety and is on savella daily swhich helps with anxiety as well as pain. GAD 7 : Generalized Anxiety Score 08/12/2019 05/11/2019 09/16/2018 08/25/2015  Nervous, Anxious, on Edge 1 0 3 3  Control/stop worrying 1 0 3 3  Worry too much - different things 1 0 3 3  Trouble relaxing 0 0 3 3  Restless 0 0 0 0  Easily annoyed or irritable 3 0 3 3  Afraid - awful might happen 0 0 0 0  Total GAD 7 Score 6 0 15 15  Anxiety Difficulty - Not difficult at all Not difficult at all -     4. Primary insomnia Has been sleeping better with out sleep aids.  5. Fibromyalgia Pain assessment: Cause of pain- fibromyalgia Pain location- moves around Pain on scale of 1-10- 7/10 Frequency- daily What increases pain-nothing seems to make it worse What makes pain Better-nothing really makes it worst Effects on ADL - does what she has to. Any change in general medical condition-none  Current opioids rx- norco 10/325 TID # meds rx- 90 Effectiveness of current meds-helps bring down to 2/10 Adverse reactions form pain meds-none Morphine equivalent- 30MEDD  Pill count performed-No Last drug screen - 08/13/18 ( high risk q73m moderate risk q621mlow risk yearly ) Urine drug screen today- Yes Was the NCGareyeviewed- yes  If yes were their any concerning findings? - no  Overdose risk score 10%   Pain contract signed on: 08/12/19   6. Gastroesophageal reflux disease, unspecified whether esophagitis present inhaled corticosteroids on  daily protonix and is doing well.  7. BMI 26.0-26.9,adult No significant weight gain. Wt Readings from Last 3 Encounters:  08/12/19 179 lb (81.2 kg)  08/03/19 184 lb (83.5 kg)  05/11/19 187 lb (84.8 kg)   BMI Readings from Last 3 Encounters:  08/12/19 27.22 kg/m  08/03/19 27.98 kg/m  05/11/19 28.43 kg/m       Outpatient Encounter Medications as of 08/12/2019  Medication Sig  . Ascorbic Acid (VITAMIN C PO) Take by mouth.  . calcium carbonate (OSCAL) 1500 (600 Ca) MG TABS tablet Take by mouth daily with breakfast.  . cephALEXin (KEFLEX) 500 MG capsule Take 1 capsule (500 mg total) by mouth 3 (three) times daily.  . Cholecalciferol (VITAMIN D3) 250 MCG (10000 UT) capsule Take 10,000 Units by mouth daily.  . cyclobenzaprine (FLEXERIL) 10 MG tablet 1 po TID  . EPINEPHrine 0.3 mg/0.3 mL IJ SOAJ injection Inject 0.3 mLs (0.3 mg total) into the muscle once.  . furosemide (LASIX) 20 MG tablet Take 1 tablet (20 mg total) by mouth daily.  . Marland Kitchenoratadine (CLARITIN) 10 MG tablet Take 10 mg by mouth daily.  . meclizine (ANTIVERT) 25 MG tablet Take 1 tablet (25 mg total) by mouth 3 (three) times daily as needed for dizziness.  . Milnacipran (SAVELLA) 50 MG TABS tablet Take 1 tablet (50 mg total) by mouth 2 (two) times daily.  .Marland Kitchen  Multiple Vitamin (MULTIVITAMIN) tablet Take 1 tablet by mouth daily.  . pantoprazole (PROTONIX) 40 MG tablet Take 1 tablet (40 mg total) by mouth 2 (two) times daily.  . pravastatin (PRAVACHOL) 40 MG tablet Take 1 tablet (40 mg total) by mouth daily.  . SUMAtriptan (IMITREX) 100 MG tablet May repeat in 2 hours if headache persists or recurs.  . valACYclovir (VALTREX) 1000 MG tablet Take 1 tablet (1,000 mg total) by mouth daily.  Marland Kitchen HYDROcodone-acetaminophen (NORCO) 10-325 MG tablet Take 1 tablet by mouth 3 (three) times daily.  Marland Kitchen HYDROcodone-acetaminophen (NORCO) 10-325 MG tablet Take 1 tablet by mouth 3 (three) times daily.  Marland Kitchen HYDROcodone-acetaminophen (NORCO) 10-325 MG  tablet Take 1 tablet by mouth 3 (three) times daily.     Past Surgical History:  Procedure Laterality Date  . ABDOMINAL HYSTERECTOMY    . ADNOIDS    . ANTERIOR CERVICAL DECOMP/DISCECTOMY FUSION N/A 10/27/2013   Procedure: ACDF/ANTERIOR CERVICAL DECOMPRESSION/DISCECTOMY FUSION  C5-C7  (2 LEVELS);  Surgeon: Melina Schools, MD;  Location: [facility];  Service: Orthopedics;  Laterality: N/A;  . EYE SURGERY Bilateral    cataract removal    Family History  Problem Relation Age of Onset  . Heart disease Mother   . Cancer Father        STOMACH CANCER  . Cancer Maternal Grandfather   . Diabetes Paternal Grandmother     New complaints: Patient is having spells where she gets really hot and sweaty and feels like she is going to be sick. Heart rate is up when she has spells.  Social history: Lives by herself  Controlled substance contract: 08/12/19    Review of Systems  Constitutional: Negative for diaphoresis.  Eyes: Negative for pain.  Respiratory: Negative for shortness of breath.   Cardiovascular: Negative for chest pain, palpitations and leg swelling.  Gastrointestinal: Negative for abdominal pain.  Endocrine: Negative for polydipsia.  Skin: Negative for rash.  Neurological: Negative for dizziness, weakness and headaches.  Hematological: Does not bruise/bleed easily.  All other systems reviewed and are negative.      Objective:   Physical Exam Vitals and nursing note reviewed.  Constitutional:      General: She is not in acute distress.    Appearance: Normal appearance. She is well-developed.  HENT:     Head: Normocephalic.     Nose: Nose normal.  Eyes:     Pupils: Pupils are equal, round, and reactive to light.  Neck:     Vascular: No carotid bruit or JVD.  Cardiovascular:     Rate and Rhythm: Normal rate and regular rhythm.     Heart sounds: Normal heart sounds.  Pulmonary:     Effort: Pulmonary effort is normal. No respiratory distress.     Breath sounds: Normal  breath sounds. No wheezing or rales.  Chest:     Chest wall: No tenderness.  Abdominal:     General: Bowel sounds are normal. There is no distension or abdominal bruit.     Palpations: Abdomen is soft. There is no hepatomegaly, splenomegaly, mass or pulsatile mass.     Tenderness: There is no abdominal tenderness.  Musculoskeletal:        General: Normal range of motion.     Cervical back: Normal range of motion and neck supple.  Lymphadenopathy:     Cervical: No cervical adenopathy.  Skin:    General: Skin is warm and dry.  Neurological:     Mental Status: She is alert and oriented to  person, place, and time.     Deep Tendon Reflexes: Reflexes are normal and symmetric.  Psychiatric:        Behavior: Behavior normal.        Thought Content: Thought content normal.        Judgment: Judgment normal.    BP 124/78   Pulse 97   Temp 99.3 F (37.4 C) (Temporal)   Resp 20   Ht '5\' 8"'$  (1.727 m)   Wt 179 lb (81.2 kg)   SpO2 99%   BMI 27.22 kg/m    EKG- sinus Alicia Abbey, FNP      Assessment & Plan:  DASHANTI BURR comes in today with chief complaint of Annual Exam   Diagnosis and orders addressed:  1. Annual physical exam - Thyroid Panel With TSH  2. Pure hypercholesterolemia Low fat diet - EKG 12-Lead - DG Chest 2 View; Future - CBC with Differential/Platelet - CMP14+EGFR - Lipid panel  3. GAD (generalized anxiety disorder) Tress management  4. Primary insomnia Bedtime routine  5. Fibromyalgia Exercise to keep muscles wrm - HYDROcodone-acetaminophen (NORCO) 10-325 MG tablet; Take 1 tablet by mouth 3 (three) times daily.  Dispense: 90 tablet; Refill: 0 - HYDROcodone-acetaminophen (NORCO) 10-325 MG tablet; Take 1 tablet by mouth 3 (three) times daily.  Dispense: 90 tablet; Refill: 0 - HYDROcodone-acetaminophen (NORCO) 10-325 MG tablet; Take 1 tablet by mouth 3 (three) times daily.  Dispense: 90 tablet; Refill: 0  6. Gastroesophageal reflux  disease, unspecified whether esophagitis present Avoid spicy foods Do not eat 2 hours prior to bedtime  7. BMI 26.0-26.9,adult Discussed diet and exercise for person with BMI >25 Will recheck weight in 3-6 months  8. Menopausal symptoms - estradiol (ESTRACE) 1 MG tablet; Take 1 tablet (1 mg total) by mouth daily.  Dispense: 90 tablet; Refill: 1  9. Pap smear, low-risk - IGP,CtNgTv,Apt HPV  10. H/O abnormal mammogram - MM DIAG BREAST TOMO BILATERAL; Future   Labs pending Health Maintenance reviewed Diet and exercise encouraged  Follow up plan: 3 months   Mary-Margaret Hassell Done, FNP

## 2019-08-12 NOTE — Patient Instructions (Signed)
Menopause Menopause is the normal time of life when menstrual periods stop completely. It is usually confirmed by 12 months without a menstrual period. The transition to menopause (perimenopause) most often happens between the ages of 45 and 55. During perimenopause, hormone levels change in your body, which can cause symptoms and affect your health. Menopause may increase your risk for:  Loss of bone (osteoporosis), which causes bone breaks (fractures).  Depression.  Hardening and narrowing of the arteries (atherosclerosis), which can cause heart attacks and strokes. What are the causes? This condition is usually caused by a natural change in hormone levels that happens as you get older. The condition may also be caused by surgery to remove both ovaries (bilateral oophorectomy). What increases the risk? This condition is more likely to start at an earlier age if you have certain medical conditions or treatments, including:  A tumor of the pituitary gland in the brain.  A disease that affects the ovaries and hormone production.  Radiation treatment for cancer.  Certain cancer treatments, such as chemotherapy or hormone (anti-estrogen) therapy.  Heavy smoking and excessive alcohol use.  Family history of early menopause. This condition is also more likely to develop earlier in women who are very thin. What are the signs or symptoms? Symptoms of this condition include:  Hot flashes.  Irregular menstrual periods.  Night sweats.  Changes in feelings about sex. This could be a decrease in sex drive or an increased comfort around your sexuality.  Vaginal dryness and thinning of the vaginal walls. This may cause painful intercourse.  Dryness of the skin and development of wrinkles.  Headaches.  Problems sleeping (insomnia).  Mood swings or irritability.  Memory problems.  Weight gain.  Hair growth on the face and chest.  Bladder infections or problems with urinating. How  is this diagnosed? This condition is diagnosed based on your medical history, a physical exam, your age, your menstrual history, and your symptoms. Hormone tests may also be done. How is this treated? In some cases, no treatment is needed. You and your health care provider should make a decision together about whether treatment is necessary. Treatment will be based on your individual condition and preferences. Treatment for this condition focuses on managing symptoms. Treatment may include:  Menopausal hormone therapy (MHT).  Medicines to treat specific symptoms or complications.  Acupuncture.  Vitamin or herbal supplements. Before starting treatment, make sure to let your health care provider know if you have a personal or family history of:  Heart disease.  Breast cancer.  Blood clots.  Diabetes.  Osteoporosis. Follow these instructions at home: Lifestyle  Do not use any products that contain nicotine or tobacco, such as cigarettes and e-cigarettes. If you need help quitting, ask your health care provider.  Get at least 30 minutes of physical activity on 5 or more days each week.  Avoid alcoholic and caffeinated beverages, as well as spicy foods. This may help prevent hot flashes.  Get 7-8 hours of sleep each night.  If you have hot flashes, try: ? Dressing in layers. ? Avoiding things that may trigger hot flashes, such as spicy food, warm places, or stress. ? Taking slow, deep breaths when a hot flash starts. ? Keeping a fan in your home and office.  Find ways to manage stress, such as deep breathing, meditation, or journaling.  Consider going to group therapy with other women who are having menopause symptoms. Ask your health care provider about recommended group therapy meetings. Eating and   drinking  Eat a healthy, balanced diet that contains whole grains, lean protein, low-fat dairy, and plenty of fruits and vegetables.  Your health care provider may recommend  adding more soy to your diet. Foods that contain soy include tofu, tempeh, and soy milk.  Eat plenty of foods that contain calcium and vitamin D for bone health. Items that are rich in calcium include low-fat milk, yogurt, beans, almonds, sardines, broccoli, and kale. Medicines  Take over-the-counter and prescription medicines only as told by your health care provider.  Talk with your health care provider before starting any herbal supplements. If prescribed, take vitamins and supplements as told by your health care provider. These may include: ? Calcium. Women age 51 and older should get 1,200 mg (milligrams) of calcium every day. ? Vitamin D. Women need 600-800 International Units of vitamin D each day. ? Vitamins B12 and B6. Aim for 50 micrograms of B12 and 1.5 mg of B6 each day. General instructions  Keep track of your menstrual periods, including: ? When they occur. ? How heavy they are and how long they last. ? How much time passes between periods.  Keep track of your symptoms, noting when they start, how often you have them, and how long they last.  Use vaginal lubricants or moisturizers to help with vaginal dryness and improve comfort during sex.  Keep all follow-up visits as told by your health care provider. This is important. This includes any group therapy or counseling. Contact a health care provider if:  You are still having menstrual periods after age 55.  You have pain during sex.  You have not had a period for 12 months and you develop vaginal bleeding. Get help right away if:  You have: ? Severe depression. ? Excessive vaginal bleeding. ? Pain when you urinate. ? A fast or irregular heart beat (palpitations). ? Severe headaches. ? Abdomen (abdominal) pain or severe indigestion.  You fell and you think you have a broken bone.  You develop leg or chest pain.  You develop vision problems.  You feel a lump in your breast. Summary  Menopause is the normal  time of life when menstrual periods stop completely. It is usually confirmed by 12 months without a menstrual period.  The transition to menopause (perimenopause) most often happens between the ages of 45 and 55.  Symptoms can be managed through medicines, lifestyle changes, and complementary therapies such as acupuncture.  Eat a balanced diet that is rich in nutrients to promote bone health and heart health and to manage symptoms during menopause. This information is not intended to replace advice given to you by your health care provider. Make sure you discuss any questions you have with your health care provider. Document Revised: 05/09/2017 Document Reviewed: 06/29/2016 Elsevier Patient Education  2020 [employer]  

## 2019-08-13 ENCOUNTER — Ambulatory Visit: Payer: Self-pay | Admitting: Nurse Practitioner

## 2019-08-13 LAB — CMP14+EGFR
ALT: 74 IU/L — ABNORMAL HIGH (ref 0–32)
AST: 59 IU/L — ABNORMAL HIGH (ref 0–40)
Albumin/Globulin Ratio: 1.6 (ref 1.2–2.2)
Albumin: 4.6 g/dL (ref 3.8–4.8)
Alkaline Phosphatase: 97 IU/L (ref 39–117)
BUN/Creatinine Ratio: 17 (ref 12–28)
BUN: 14 mg/dL (ref 8–27)
Bilirubin Total: 0.3 mg/dL (ref 0.0–1.2)
CO2: 23 mmol/L (ref 20–29)
Calcium: 9.7 mg/dL (ref 8.7–10.3)
Chloride: 100 mmol/L (ref 96–106)
Creatinine, Ser: 0.82 mg/dL (ref 0.57–1.00)
GFR calc Af Amer: 89 mL/min/{1.73_m2} (ref >59)
GFR calc non Af Amer: 77 mL/min/{1.73_m2} (ref >59)
Globulin, Total: 2.8 g/dL (ref 1.5–4.5)
Glucose: 124 mg/dL — ABNORMAL HIGH (ref 65–99)
Potassium: 4 mmol/L (ref 3.5–5.2)
Sodium: 141 mmol/L (ref 134–144)
Total Protein: 7.4 g/dL (ref 6.0–8.5)

## 2019-08-13 LAB — CBC WITH DIFFERENTIAL/PLATELET
Basophils Absolute: 0 10*3/uL (ref 0.0–0.2)
Basos: 0 %
EOS (ABSOLUTE): 0.1 10*3/uL (ref 0.0–0.4)
Eos: 2 %
Hematocrit: 43.6 % (ref 34.0–46.6)
Hemoglobin: 14.8 g/dL (ref 11.1–15.9)
Immature Grans (Abs): 0 10*3/uL (ref 0.0–0.1)
Immature Granulocytes: 0 %
Lymphocytes Absolute: 2 10*3/uL (ref 0.7–3.1)
Lymphs: 35 %
MCH: 29.5 pg (ref 26.6–33.0)
MCHC: 33.9 g/dL (ref 31.5–35.7)
MCV: 87 fL (ref 79–97)
Monocytes Absolute: 0.4 10*3/uL (ref 0.1–0.9)
Monocytes: 8 %
Neutrophils Absolute: 3.1 10*3/uL (ref 1.4–7.0)
Neutrophils: 55 %
Platelets: 218 10*3/uL (ref 150–450)
RBC: 5.01 x10E6/uL (ref 3.77–5.28)
RDW: 11.8 % (ref 11.7–15.4)
WBC: 5.6 10*3/uL (ref 3.4–10.8)

## 2019-08-13 LAB — LIPID PANEL
Chol/HDL Ratio: 3.7 ratio (ref 0.0–4.4)
Cholesterol, Total: 131 mg/dL (ref 100–199)
HDL: 35 mg/dL — ABNORMAL LOW (ref >39)
LDL Chol Calc (NIH): 60 mg/dL (ref 0–99)
Triglycerides: 218 mg/dL — ABNORMAL HIGH (ref 0–149)
VLDL Cholesterol Cal: 36 mg/dL (ref 5–40)

## 2019-08-13 LAB — THYROID PANEL WITH TSH
Free Thyroxine Index: 1.5 (ref 1.2–4.9)
T3 Uptake Ratio: 22 % — ABNORMAL LOW (ref 24–39)
T4, Total: 6.7 ug/dL (ref 4.5–12.0)
TSH: 2.51 u[IU]/mL (ref 0.450–4.500)

## 2019-08-17 LAB — IGP,CTNGTV,APT HPV
Chlamydia, Nuc. Acid Amp: NEGATIVE
Gonococcus, Nuc. Acid Amp: NEGATIVE
HPV Aptima: NEGATIVE
Trich vag by NAA: NEGATIVE

## 2019-08-19 LAB — TOXASSURE SELECT 13 (MW), URINE

## 2019-09-02 ENCOUNTER — Ambulatory Visit
Admission: RE | Admit: 2019-09-02 | Discharge: 2019-09-02 | Disposition: A | Discharge status: Home / Self Care | Payer: 59 | Source: Ambulatory Visit | Attending: Nurse Practitioner | Admitting: Nurse Practitioner

## 2019-09-02 ENCOUNTER — Other Ambulatory Visit: Payer: Self-pay

## 2019-09-02 DIAGNOSIS — Z87898 Personal history of other specified conditions: Secondary | ICD-10-CM

## 2019-11-10 ENCOUNTER — Other Ambulatory Visit: Payer: Self-pay | Admitting: Nurse Practitioner

## 2019-11-10 DIAGNOSIS — G43109 Migraine with aura, not intractable, without status migrainosus: Secondary | ICD-10-CM

## 2019-11-18 ENCOUNTER — Ambulatory Visit: Payer: 59 | Admitting: Nurse Practitioner

## 2019-11-18 ENCOUNTER — Encounter: Payer: Self-pay | Admitting: Nurse Practitioner

## 2019-11-18 ENCOUNTER — Other Ambulatory Visit: Payer: Self-pay

## 2019-11-18 VITALS — BP 129/86 | HR 92 | Temp 98.5°F | Resp 20 | Ht 68.0 in | Wt 185.0 lb

## 2019-11-18 DIAGNOSIS — E7800 Pure hypercholesterolemia, unspecified: Secondary | ICD-10-CM

## 2019-11-18 DIAGNOSIS — G43109 Migraine with aura, not intractable, without status migrainosus: Secondary | ICD-10-CM | POA: Diagnosis not present

## 2019-11-18 DIAGNOSIS — Z6826 Body mass index (BMI) 26.0-26.9, adult: Secondary | ICD-10-CM

## 2019-11-18 DIAGNOSIS — F411 Generalized anxiety disorder: Secondary | ICD-10-CM | POA: Diagnosis not present

## 2019-11-18 DIAGNOSIS — K219 Gastro-esophageal reflux disease without esophagitis: Secondary | ICD-10-CM

## 2019-11-18 DIAGNOSIS — N951 Menopausal and female climacteric states: Secondary | ICD-10-CM

## 2019-11-18 DIAGNOSIS — F5101 Primary insomnia: Secondary | ICD-10-CM

## 2019-11-18 DIAGNOSIS — M797 Fibromyalgia: Secondary | ICD-10-CM | POA: Diagnosis not present

## 2019-11-18 DIAGNOSIS — B009 Herpesviral infection, unspecified: Secondary | ICD-10-CM

## 2019-11-18 MED ORDER — ESTRADIOL 1 MG PO TABS: 1.0000 mg | ORAL_TABLET | Freq: Every day | ORAL | 1 refills | Status: DC

## 2019-11-18 MED ORDER — HYDROCODONE-ACETAMINOPHEN 10-325 MG PO TABS: 1.0000 | ORAL_TABLET | Freq: Three times a day (TID) | ORAL | 0 refills | Status: DC

## 2019-11-18 MED ORDER — SAVELLA 50 MG PO TABS: 50.0000 mg | ORAL_TABLET | Freq: Two times a day (BID) | ORAL | 1 refills | Status: DC

## 2019-11-18 MED ORDER — VALACYCLOVIR HCL 1 G PO TABS: 1000.0000 mg | ORAL_TABLET | Freq: Every day | ORAL | 1 refills | Status: DC

## 2019-11-18 MED ORDER — KETOROLAC TROMETHAMINE 60 MG/2ML IM SOLN
60.0000 mg | Freq: Once | INTRAMUSCULAR | Status: AC
  Administered 2019-11-18: 60 mg via INTRAMUSCULAR

## 2019-11-18 MED ORDER — FUROSEMIDE 20 MG PO TABS: 20.0000 mg | ORAL_TABLET | Freq: Every day | ORAL | 1 refills | Status: DC

## 2019-11-18 MED ORDER — PRAVASTATIN SODIUM 40 MG PO TABS: 40.0000 mg | ORAL_TABLET | Freq: Every day | ORAL | 1 refills | Status: DC

## 2019-11-18 MED ORDER — PANTOPRAZOLE SODIUM 40 MG PO TBEC: 40.0000 mg | DELAYED_RELEASE_TABLET | Freq: Two times a day (BID) | ORAL | 1 refills | Status: DC

## 2019-11-18 NOTE — Progress Notes (Addendum)
Subjective:    Patient ID: [name], female    DOB: 01/08/1961, 59 y.o.   MRN: [id number]   Chief Complaint: Medical Management of Chronic Issues (Sore place on back of right leg)    HPI:  1. Pure hypercholesterolemia Does watch diet and stays very active. She does not do any dedicated exercise. Lab Results  Component Value Date   CHOL 131 08/12/2019   HDL 35 (L) 08/12/2019   LDLCALC 60 08/12/2019   TRIG 218 (H) 08/12/2019   CHOLHDL 3.7 08/12/2019     2. Migraine with aura and without status migrainosus, not intractable Has had more recently due to stress wit her mom.  3. Gastroesophageal reflux disease, unspecified whether esophagitis present Is on protonix daily and that works well to keep symptoms under control.  4. GAD (generalized anxiety disorder) stays stressed. GAD 7 : Generalized Anxiety Score 11/18/2019 08/12/2019 05/11/2019 09/16/2018  Nervous, Anxious, on Edge 3 1 0 3  Control/stop worrying 3 1 0 3  Worry too much - different things 3 1 0 3  Trouble relaxing 3 0 0 3  Restless 1 0 0 0  Easily annoyed or irritable 3 3 0 3  Afraid - awful might happen 1 0 0 0  Total GAD 7 Score 17 6 0 15  Anxiety Difficulty Not difficult at all - Not difficult at all Not difficult at all      5. Primary insomnia Not sleeping well. Cannot do ambien due pain medication  6. Fibromyalgia Is on savella daily as well as pain medication. Pain assessment: Cause of pain- fibromyalgia Pain location- all over Pain on scale of 1-10- *6/10currently Frequency- daily What increases pain-to much activity What makes pain Better-pain meds help Effects on ADL - none Any change in general medical condition-none  Current opioids rx- norco 10/325 tid # meds rx- 90 Effectiveness of current meds-helps Adverse reactions form pain meds-none Morphine equivalent- 30MEDD  Pill count performed-No Last drug screen - 08/12/19 ( high risk q22m, moderate risk q76m, low risk yearly ) Urine  drug screen today- No Was the [place] reviewed- yes  If yes were their any concerning findings? - no   Overdose risk: 1 Opioid Risk  08/12/2019  Alcohol 0  Illegal Drugs 0  Rx Drugs 0  Alcohol 0  Illegal Drugs 0  Rx Drugs 0  Age between 16-45 years  0  History of Preadolescent Sexual Abuse 0  Psychological Disease 0  Depression 0  Opioid Risk Tool Scoring 0  Opioid Risk Interpretation Low Risk     Pain contract signed on:09/01/19   7. BMI 26.0-26.9,adult No recent weight changes Wt Readings from Last 3 Encounters:  11/18/19 185 lb (83.9 kg)  08/12/19 179 lb (81.2 kg)  08/03/19 184 lb (83.5 kg)   BMI Readings from Last 3 Encounters:  11/18/19 28.13 kg/m  08/12/19 27.22 kg/m  08/03/19 27.98 kg/m       Outpatient Encounter Medications as of 11/18/2019  Medication Sig  . Ascorbic Acid (VITAMIN C PO) Take by mouth.  . calcium carbonate (OSCAL) 1500 (600 Ca) MG TABS tablet Take by mouth daily with breakfast.  . Cholecalciferol (VITAMIN D3) 250 MCG (10000 UT) capsule Take 10,000 Units by mouth daily.  . cyclobenzaprine (FLEXERIL) 10 MG tablet 1 po TID  . EPINEPHrine 0.3 mg/0.3 mL IJ SOAJ injection Inject 0.3 mLs (0.3 mg total) into the muscle once.  Marland Kitchen estradiol (ESTRACE) 1 MG tablet Take 1 tablet (1 mg total) by  mouth daily.  . furosemide (LASIX) 20 MG tablet Take 1 tablet (20 mg total) by mouth daily.  Marland Kitchen loratadine (CLARITIN) 10 MG tablet Take 10 mg by mouth daily.  . meclizine (ANTIVERT) 25 MG tablet Take 1 tablet (25 mg total) by mouth 3 (three) times daily as needed for dizziness.  . Milnacipran (SAVELLA) 50 MG TABS tablet Take 1 tablet (50 mg total) by mouth 2 (two) times daily.  . Multiple Vitamin (MULTIVITAMIN) tablet Take 1 tablet by mouth daily.  . pantoprazole (PROTONIX) 40 MG tablet Take 1 tablet (40 mg total) by mouth 2 (two) times daily.  . pravastatin (PRAVACHOL) 40 MG tablet Take 1 tablet (40 mg total) by mouth daily.  . SUMAtriptan (IMITREX) 100 MG tablet  TAKE 1 TABLET AT ONSET OF HEADACHE, MAY REPEAT ONCE IN 2HOURS  . valACYclovir (VALTREX) 1000 MG tablet Take 1 tablet (1,000 mg total) by mouth daily.  Marland Kitchen HYDROcodone-acetaminophen (NORCO) 10-325 MG tablet Take 1 tablet by mouth 3 (three) times daily.  Marland Kitchen HYDROcodone-acetaminophen (NORCO) 10-325 MG tablet Take 1 tablet by mouth 3 (three) times daily.  Marland Kitchen HYDROcodone-acetaminophen (NORCO) 10-325 MG tablet Take 1 tablet by mouth 3 (three) times daily.     Past Surgical History:  Procedure Laterality Date  . ABDOMINAL HYSTERECTOMY    . ADNOIDS    . ANTERIOR CERVICAL DECOMP/DISCECTOMY FUSION N/A 10/27/2013   Procedure: ACDF/ANTERIOR CERVICAL DECOMPRESSION/DISCECTOMY FUSION  C5-C7  (2 LEVELS);  Surgeon: Melina Schools, MD;  Location: [facility];  Service: Orthopedics;  Laterality: N/A;  . EYE SURGERY Bilateral    cataract removal    Family History  Problem Relation Age of Onset  . Heart disease Mother   . Cancer Father        STOMACH CANCER  . Cancer Maternal Grandfather   . Diabetes Paternal Grandmother     New complaints: None today  Social history: Lives by herself- is having to take care of her elderly mother      Review of Systems  Constitutional: Negative for diaphoresis.  Eyes: Negative for pain.  Respiratory: Negative for shortness of breath.   Cardiovascular: Negative for chest pain, palpitations and leg swelling.  Gastrointestinal: Negative for abdominal pain.  Endocrine: Negative for polydipsia.  Musculoskeletal: Positive for arthralgias and myalgias.  Skin: Negative for rash.  Neurological: Negative for dizziness, weakness and headaches.  Hematological: Does not bruise/bleed easily.  Psychiatric/Behavioral: The patient is nervous/anxious.   All other systems reviewed and are negative.      Objective:   Physical Exam Vitals and nursing note reviewed.  Constitutional:      General: She is not in acute distress.    Appearance: Normal appearance. She is  well-developed.  HENT:     Head: Normocephalic.     Nose: Nose normal.  Eyes:     Pupils: Pupils are equal, round, and reactive to light.  Neck:     Vascular: No carotid bruit or JVD.  Cardiovascular:     Rate and Rhythm: Normal rate and regular rhythm.     Heart sounds: Normal heart sounds.  Pulmonary:     Effort: Pulmonary effort is normal. No respiratory distress.     Breath sounds: Normal breath sounds. No wheezing or rales.  Chest:     Chest wall: No tenderness.  Abdominal:     General: Bowel sounds are normal. There is no distension or abdominal bruit.     Palpations: Abdomen is soft. There is no hepatomegaly, splenomegaly, mass or pulsatile mass.  Tenderness: There is no abdominal tenderness.  Musculoskeletal:        General: Normal range of motion.     Cervical back: Normal range of motion and neck supple.  Lymphadenopathy:     Cervical: No cervical adenopathy.  Skin:    General: Skin is warm and dry.  Neurological:     Mental Status: She is alert and oriented to person, place, and time.     Deep Tendon Reflexes: Reflexes are normal and symmetric.  Psychiatric:        Behavior: Behavior normal.        Thought Content: Thought content normal.        Judgment: Judgment normal.     BP 129/86   Pulse 92   Temp 98.5 F (36.9 C) (Temporal)   Resp 20   Ht 5\' 8"  (1.727 m)   Wt 185 lb (83.9 kg)   SpO2 94%   BMI 28.13 kg/m        Assessment & Plan:  ELLIEMAE BRAMAN comes in today with chief complaint of Medical Management of Chronic Issues (Sore place on back of right leg)   Diagnosis and orders addressed:  1. Pure hypercholesterolemia Low fat diet - pravastatin (PRAVACHOL) 40 MG tablet; Take 1 tablet (40 mg total) by mouth daily.  Dispense: 90 tablet; Refill: 1  2. Migraine with aura and without status migrainosus, not intractable Avoid caffeine  3. Gastroesophageal reflux disease, unspecified whether esophagitis present Avoid spicy foods Do not eat  2 hours prior to bedtime  4. GAD (generalized anxiety disorder) Stress management  5. Primary insomnia Bedtime routine  6. Fibromyalgia Exercise to keep muscles warm toradol 60mg  IM now - Milnacipran (SAVELLA) 50 MG TABS tablet; Take 1 tablet (50 mg total) by mouth 2 (two) times daily.  Dispense: 180 tablet; Refill: 1 - HYDROcodone-acetaminophen (NORCO) 10-325 MG tablet; Take 1 tablet by mouth 3 (three) times daily.  Dispense: 90 tablet; Refill: 0 - HYDROcodone-acetaminophen (NORCO) 10-325 MG tablet; Take 1 tablet by mouth 3 (three) times daily.  Dispense: 90 tablet; Refill: 0 - HYDROcodone-acetaminophen (NORCO) 10-325 MG tablet; Take 1 tablet by mouth 3 (three) times daily.  Dispense: 90 tablet; Refill: 0  7. BMI 26.0-26.9,adult Discussed diet and exercise for person with BMI >25 Will recheck weight in 3-6 months  8. Gastroesophageal reflux disease Avoid spicy foods Do not eat 2 hours prior to bedtime - pantoprazole (PROTONIX) 40 MG tablet; Take 1 tablet (40 mg total) by mouth 2 (two) times daily.  Dispense: 180 tablet; Refill: 1  9. HSV-2 infection - valACYclovir (VALTREX) 1000 MG tablet; Take 1 tablet (1,000 mg total) by mouth daily.  Dispense: 90 tablet; Refill: 1  10. Menopausal symptoms - estradiol (ESTRACE) 1 MG tablet; Take 1 tablet (1 mg total) by mouth daily.  Dispense: 90 tablet; Refill: 1   Labs pending Health Maintenance reviewed Diet and exercise encouraged  Follow up plan: 3 months   Mary-Margaret Hassell Done, FNP

## 2019-11-18 NOTE — Addendum Note (Signed)
Addended by: Chevis Pretty on: 11/18/2019 11:19 AM   Modules accepted: Orders

## 2019-11-18 NOTE — Patient Instructions (Signed)
Stress, Adult Stress is a normal reaction to life events. Stress is what you feel when life demands more than you are used to, or more than you think you can handle. Some stress can be useful, such as studying for a test or meeting a deadline at work. Stress that occurs too often or for too long can cause problems. It can affect your emotional health and interfere with relationships and normal daily activities. Too much stress can weaken your body's defense system (immune system) and increase your risk for physical illness. If you already have a medical problem, stress can make it worse. What are the causes? All sorts of life events can cause stress. An event that causes stress for one person may not be stressful for another person. Major life events, whether positive or negative, commonly cause stress. Examples include:  Losing a job or starting a new job.  Losing a loved one.  Moving to a new town or home.  Getting married or divorced.  Having a baby.  Getting injured or sick. Less obvious life events can also cause stress, especially if they occur day after day or in combination with each other. Examples include:  Working long hours.  Driving in traffic.  Caring for children.  Being in debt.  Being in a difficult relationship. What are the signs or symptoms? Stress can cause emotional symptoms, including:  Anxiety. This is feeling worried, afraid, on edge, overwhelmed, or out of control.  Anger, including irritation or impatience.  Depression. This is feeling sad, down, helpless, or guilty.  Trouble focusing, remembering, or making decisions. Stress can cause physical symptoms, including:  Aches and pains. These may affect your head, neck, back, stomach, or other areas of your body.  Tight muscles or a clenched jaw.  Low energy.  Trouble sleeping. Stress can cause unhealthy behaviors, including:  Eating to feel better (overeating) or skipping meals.  Working too  much or putting off tasks.  Smoking, drinking alcohol, or using drugs to feel better. How is this diagnosed? Stress is diagnosed through an assessment by your health care provider. He or she may diagnose this condition based on:  Your symptoms and any stressful life events.  Your medical history.  Tests to rule out other causes of your symptoms. Depending on your condition, your health care provider may refer you to a specialist for further evaluation. How is this treated?  Stress management techniques are the recommended treatment for stress. Medicine is not typically recommended for the treatment of stress. Techniques to reduce your reaction to stressful life events include:  Stress identification. Monitor yourself for symptoms of stress and identify what causes stress for you. These skills may help you to avoid or prepare for stressful events.  Time management. Set your priorities, keep a calendar of events, and learn to say no. Taking these actions can help you avoid making too many commitments. Techniques for coping with stress include:  Rethinking the problem. Try to think realistically about stressful events rather than ignoring them or overreacting. Try to find the positives in a stressful situation rather than focusing on the negatives.  Exercise. Physical exercise can release both physical and emotional tension. The key is to find a form of exercise that you enjoy and do it regularly.  Relaxation techniques. These relax the body and mind. The key is to find one or more that you enjoy and use the techniques regularly. Examples include: ? Meditation, deep breathing, or progressive relaxation techniques. ? Yoga or   tai chi. ? Biofeedback, mindfulness techniques, or journaling. ? Listening to music, being out in nature, or participating in other hobbies.  Practicing a healthy lifestyle. Eat a balanced diet, drink plenty of water, limit or avoid caffeine, and get plenty of  sleep.  Having a strong support network. Spend time with family, friends, or other people you enjoy being around. Express your feelings and talk things over with someone you trust. Counseling or talk therapy with a mental health professional may be helpful if you are having trouble managing stress on your own. Follow these instructions at home: Lifestyle   Avoid drugs.  Do not use any products that contain nicotine or tobacco, such as cigarettes, e-cigarettes, and chewing tobacco. If you need help quitting, ask your health care provider.  Limit alcohol intake to no more than 1 drink a day for nonpregnant women and 2 drinks a day for men. One drink equals 12 oz of beer, 5 oz of wine, or 1 oz of hard liquor  Do not use alcohol or drugs to relax.  Eat a balanced diet that includes fresh fruits and vegetables, whole grains, lean meats, fish, eggs, and beans, and low-fat dairy. Avoid processed foods and foods high in added fat, sugar, and salt.  Exercise at least 30 minutes on 5 or more days each week.  Get 7-8 hours of sleep each night. General instructions   Practice stress management techniques as discussed with your health care provider.  Drink enough fluid to keep your urine clear or pale yellow.  Take over-the-counter and prescription medicines only as told by your health care provider.  Keep all follow-up visits as told by your health care provider. This is important. Contact a health care provider if:  Your symptoms get worse.  You have new symptoms.  You feel overwhelmed by your problems and can no longer manage them on your own. Get help right away if:  You have thoughts of hurting yourself or others. If you ever feel like you may hurt yourself or others, or have thoughts about taking your own life, get help right away. You can go to your nearest emergency department or call:  Your local emergency services (911 in the [place]  A suicide crisis helpline, such as the  [place] at (316) 250-6172. This is open 24 hours a day. Summary  Stress is a normal reaction to life events. It can cause problems if it happens too often or for too long.  Practicing stress management techniques is the best way to treat stress.  Counseling or talk therapy with a mental health professional may be helpful if you are having trouble managing stress on your own. This information is not intended to replace advice given to you by your health care provider. Make sure you discuss any questions you have with your health care provider. Document Revised: 12/25/2018 Document Reviewed: 07/17/2016 Elsevier Patient Education  [place].

## 2019-11-19 LAB — CBC WITH DIFFERENTIAL/PLATELET
Basophils Absolute: 0 10*3/uL (ref 0.0–0.2)
Basos: 0 %
EOS (ABSOLUTE): 0.1 10*3/uL (ref 0.0–0.4)
Eos: 1 %
Hematocrit: 45.7 % (ref 34.0–46.6)
Hemoglobin: 15.1 g/dL (ref 11.1–15.9)
Immature Grans (Abs): 0 10*3/uL (ref 0.0–0.1)
Immature Granulocytes: 0 %
Lymphocytes Absolute: 1.9 10*3/uL (ref 0.7–3.1)
Lymphs: 26 %
MCH: 28.9 pg (ref 26.6–33.0)
MCHC: 33 g/dL (ref 31.5–35.7)
MCV: 87 fL (ref 79–97)
Monocytes Absolute: 0.5 10*3/uL (ref 0.1–0.9)
Monocytes: 6 %
Neutrophils Absolute: 5.1 10*3/uL (ref 1.4–7.0)
Neutrophils: 67 %
Platelets: 232 10*3/uL (ref 150–450)
RBC: 5.23 x10E6/uL (ref 3.77–5.28)
RDW: 12.5 % (ref 11.7–15.4)
WBC: 7.6 10*3/uL (ref 3.4–10.8)

## 2019-11-19 LAB — CMP14+EGFR
ALT: 40 IU/L — ABNORMAL HIGH (ref 0–32)
AST: 39 IU/L (ref 0–40)
Albumin/Globulin Ratio: 1.6 (ref 1.2–2.2)
Albumin: 4.3 g/dL (ref 3.8–4.8)
Alkaline Phosphatase: 76 IU/L (ref 48–121)
BUN/Creatinine Ratio: 15 (ref 12–28)
BUN: 12 mg/dL (ref 8–27)
Bilirubin Total: 0.2 mg/dL (ref 0.0–1.2)
CO2: 20 mmol/L (ref 20–29)
Calcium: 9.3 mg/dL (ref 8.7–10.3)
Chloride: 103 mmol/L (ref 96–106)
Creatinine, Ser: 0.78 mg/dL (ref 0.57–1.00)
GFR calc Af Amer: 95 mL/min/{1.73_m2} (ref >59)
GFR calc non Af Amer: 82 mL/min/{1.73_m2} (ref >59)
Globulin, Total: 2.7 g/dL (ref 1.5–4.5)
Glucose: 129 mg/dL — ABNORMAL HIGH (ref 65–99)
Potassium: 3.8 mmol/L (ref 3.5–5.2)
Sodium: 141 mmol/L (ref 134–144)
Total Protein: 7 g/dL (ref 6.0–8.5)

## 2019-11-19 LAB — LIPID PANEL
Chol/HDL Ratio: 3.1 ratio (ref 0.0–4.4)
Cholesterol, Total: 143 mg/dL (ref 100–199)
HDL: 46 mg/dL (ref >39)
LDL Chol Calc (NIH): 67 mg/dL (ref 0–99)
Triglycerides: 177 mg/dL — ABNORMAL HIGH (ref 0–149)
VLDL Cholesterol Cal: 30 mg/dL (ref 5–40)

## 2020-01-04 ENCOUNTER — Other Ambulatory Visit: Payer: Self-pay

## 2020-01-04 ENCOUNTER — Encounter: Payer: Self-pay | Admitting: Nurse Practitioner

## 2020-01-04 ENCOUNTER — Ambulatory Visit: Payer: 59 | Admitting: Nurse Practitioner

## 2020-01-04 VITALS — BP 137/95 | HR 96 | Temp 98.0°F | Resp 20 | Ht 68.0 in | Wt 184.0 lb

## 2020-01-04 DIAGNOSIS — R2232 Localized swelling, mass and lump, left upper limb: Secondary | ICD-10-CM | POA: Diagnosis not present

## 2020-01-04 DIAGNOSIS — M797 Fibromyalgia: Secondary | ICD-10-CM

## 2020-01-04 MED ORDER — KETOROLAC TROMETHAMINE 60 MG/2ML IM SOLN
60.0000 mg | Freq: Once | INTRAMUSCULAR | Status: AC
  Administered 2020-01-04: 60 mg via INTRAMUSCULAR

## 2020-01-04 NOTE — Progress Notes (Signed)
° °  Subjective:    Patient ID: [name], female    DOB: 07-12-1960, 59 y.o.   MRN: [id number]   Chief Complaint: Knot under left arm (Painful)   HPI Patient comes in with a fatty area in left axilla that is now tender. She has had mammogram which were negative.  - fibromyalgia is really bothering her today and she would like a toradol shot. toradol usualy helps for several weeks.   Review of Systems  Constitutional: Negative for diaphoresis.  Eyes: Negative for pain.  Respiratory: Negative for shortness of breath.   Cardiovascular: Negative for chest pain, palpitations and leg swelling.  Gastrointestinal: Negative for abdominal pain.  Endocrine: Negative for polydipsia.  Skin: Negative for rash.  Neurological: Negative for dizziness, weakness and headaches.  Hematological: Does not bruise/bleed easily.  All other systems reviewed and are negative.      Objective:   Physical Exam Vitals and nursing note reviewed.  Constitutional:      Appearance: Normal appearance.  Cardiovascular:     Rate and Rhythm: Normal rate and regular rhythm.     Heart sounds: Normal heart sounds.  Pulmonary:     Breath sounds: Normal breath sounds.  Skin:    General: Skin is warm.     Comments: Large soft slightly tender mass left axillia  Neurological:     General: No focal deficit present.     Mental Status: She is alert and oriented to person, place, and time.  Psychiatric:        Mood and Affect: Mood normal.        Behavior: Behavior normal.    BP (!) 137/95    Pulse 96    Temp 98 F (36.7 C) (Temporal)    Resp 20    Ht 5\' 8"  (1.727 m)    Wt 184 lb (83.5 kg)    SpO2 98%    BMI 27.98 kg/m         Assessment & Plan:  [name] in today with chief complaint of Knot under left arm (Painful)   1. Axillary mass, left Will call with results Will do surgeon referral once results ar back - US BREAST COMPLETE UNI LEFT INC AXILLA  2. Fibromyalgia Moist heat rest -  ketorolac (TORADOL) injection 60 mg    The above assessment and management plan was discussed with the patient. The patient verbalized understanding of and has agreed to the management plan. Patient is aware to call the clinic if symptoms persist or worsen. Patient is aware when to return to the clinic for a follow-up visit. Patient educated on when it is appropriate to go to the emergency department.   [name] Done, FNP

## 2020-01-05 ENCOUNTER — Other Ambulatory Visit: Payer: Self-pay | Admitting: Nurse Practitioner

## 2020-01-05 DIAGNOSIS — R2232 Localized swelling, mass and lump, left upper limb: Secondary | ICD-10-CM

## 2020-01-27 ENCOUNTER — Other Ambulatory Visit: Payer: Self-pay

## 2020-01-27 ENCOUNTER — Ambulatory Visit
Admission: RE | Admit: 2020-01-27 | Discharge: 2020-01-27 | Disposition: A | Discharge status: Home / Self Care | Payer: 59 | Source: Ambulatory Visit | Attending: Nurse Practitioner | Admitting: Nurse Practitioner

## 2020-01-27 DIAGNOSIS — R2232 Localized swelling, mass and lump, left upper limb: Secondary | ICD-10-CM

## 2020-02-15 ENCOUNTER — Other Ambulatory Visit: Payer: Self-pay

## 2020-02-15 ENCOUNTER — Encounter: Payer: Self-pay | Admitting: Physical Therapy

## 2020-02-15 ENCOUNTER — Ambulatory Visit: Payer: 59 | Attending: Physician Assistant | Admitting: Physical Therapy

## 2020-02-15 DIAGNOSIS — R262 Difficulty in walking, not elsewhere classified: Secondary | ICD-10-CM | POA: Insufficient documentation

## 2020-02-15 DIAGNOSIS — M6281 Muscle weakness (generalized): Secondary | ICD-10-CM | POA: Insufficient documentation

## 2020-02-15 DIAGNOSIS — G8929 Other chronic pain: Secondary | ICD-10-CM | POA: Insufficient documentation

## 2020-02-15 DIAGNOSIS — M25561 Pain in right knee: Secondary | ICD-10-CM | POA: Insufficient documentation

## 2020-02-15 NOTE — Therapy (Signed)
[facility] [place], [place], [address] Phone: [phone]   Fax:  [phone]  Physical Therapy Evaluation  Patient Details  Name: [name] MRN: [id number] Date of Birth: [birth year] Referring Provider (PT): Shelle Iron, [place]   Encounter Date: 02/15/2020   PT End of Session - 02/15/20 1255    Visit Number 1    Number of Visits 8    Date for PT Re-Evaluation 03/21/20    Authorization Type Progress note every 10th visit    PT Start Time 0900    PT Stop Time 0942    PT Time Calculation (min) 42 min    Activity Tolerance Patient tolerated treatment well    Behavior During Therapy [facility] for tasks assessed/performed           Past Medical History:  Diagnosis Date   Arthritis    right hand   Dyslipidemia    Family history of anesthesia complication    patients mother has severe nausea and vomiting after   Fibromyalgia    GERD (gastroesophageal reflux disease)    Guillain-Barre syndrome ([place])    Hiatal hernia    Kidney stones    Migraines    PONV (postoperative nausea and vomiting)    TMJ (dislocation of temporomandibular joint)    UTI (urinary tract infection)    hx of   Vitamin D deficiency     Past Surgical History:  Procedure Laterality Date   ABDOMINAL HYSTERECTOMY     ADNOIDS     ANTERIOR CERVICAL DECOMP/DISCECTOMY FUSION N/A 10/27/2013   Procedure: ACDF/ANTERIOR CERVICAL DECOMPRESSION/DISCECTOMY FUSION  C5-C7  (2 LEVELS);  Surgeon: Melina Schools, MD;  Location: [place];  Service: Orthopedics;  Laterality: N/A;   EYE SURGERY Bilateral    cataract removal    There were no vitals filed for this visit.    Subjective Assessment - 02/15/20 1240    Subjective COVID-19 screening performed upon arrival. Patient arrives to physical therapy with reports of ongoing right knee pain, swelling, and stiffness. Patient underwent a right knee arthroscopy on 05/19/2019 but was told it was a failed  intervention due to ongoing pain and swelling. Patient had her knee aspirated and had an injection last visit with some relief. Patient reports difficulties with ADLs, work activities, and home activities. Pain at worst is rated at 8/10 and pain at best as 3/10. Patient utilizes cold pack, pain medication and elevates to alleviate pain. Patient's goals are to strengthen knee, improve ROM, decrease swelling and prevent TKA in near future.    Pertinent History R knee arthroscopy 05/19/2019; Fibromyalgia, migranes, anterior cervical decompression fusio n5/2015    Limitations Sitting;Standing;Walking;House hold activities    Diagnostic tests x-ray: inflammation    Patient Stated Goals strengthen enough to prolong TKA    Currently in Pain? Yes    Pain Score 3     Pain Location Knee    Pain Orientation Right    Pain Descriptors / Indicators Tightness    Pain Type Chronic pain    Pain Onset More than a month ago    Pain Frequency Constant    Aggravating Factors  walking, steps    Pain Relieving Factors ice, elevation, pain pills    Effect of Pain on Daily Activities pain with ADLs, walking, steps, and work activities.              [facility] PT Assessment - 02/15/20 0001      Assessment   Medical Diagnosis Pain in  right knee    Referring Provider (PT) Shelle Iron, PA-C    Onset Date/Surgical Date 05/16/19    Next MD Visit to be scheduled    Prior Therapy yes      Precautions   Precautions Other (comment)    Precaution Comments pain free ROM and strengthening      Restrictions   Weight Bearing Restrictions No      Balance Screen   Has the patient fallen in the past 6 months No    Has the patient had a decrease in activity level because of a fear of falling?  No    Is the patient reluctant to leave their home because of a fear of falling?  No      Home Environment   Living Environment Private residence    Living Arrangements Alone    Type of [place] Access Stairs to enter     Entrance Stairs-Number of Steps 3    Entrance Stairs-Rails None      Prior Function   Level of Independence Independent    Vocation Full time employment      Observation/Other Assessments-Edema    Edema Circumferential      Circumferential Edema   Circumferential - Right 37.1 cm    Circumferential - Left  35.3 cm      ROM / Strength   AROM / PROM / Strength Strength;AROM;PROM      AROM   Overall AROM  Within functional limits for tasks performed    AROM Assessment Site Knee    Right/Left Knee Right    Right Knee Extension 2    Right Knee Flexion 155      PROM   Overall PROM  Within functional limits for tasks performed    PROM Assessment Site Knee    Right/Left Knee Right    Right Knee Extension 1    Right Knee Flexion 155      Strength   Overall Strength Deficits;Due to pain    Strength Assessment Site Knee;Hip    Right/Left Hip Right    Right Hip Flexion 4/5    Right Hip Extension 4/5    Right Hip ABduction 4-/5    Right/Left Knee Right    Right Knee Flexion 4+/5    Right Knee Extension 4-/5   "uncomfortable"     Palpation   Patella mobility decreased right patella mobilization superior/inferior    Palpation comment pain with palpation to lateral aspect and posterior knee      Transfers   Transfers Independent with all Transfers      Ambulation/Gait   Gait Pattern Step-through pattern;Decreased stance time - left;Decreased step length - left;Decreased hip/knee flexion - right;Decreased weight shift to right;Narrow base of support                      Objective measurements completed on examination: See above findings.       [facility] Adult PT Treatment/Exercise - 02/15/20 0001      Modalities   Modalities Vasopneumatic      Vasopneumatic   Number Minutes Vasopneumatic  10 minutes    Vasopnuematic Location  Knee    Vasopneumatic Pressure Low    Vasopneumatic Temperature  34                  PT Education - 02/15/20 1254     Education Details Cont with SLR, sidelying hip abd, HR, and standing hip abduction provided  from last episode of care    Person(s) Educated Patient    Methods Explanation    Comprehension Verbalized understanding            PT Short Term Goals - 02/15/20 1308      PT SHORT TERM GOAL #1   Title STG=LTG             PT Long Term Goals - 02/15/20 1309      PT LONG TERM GOAL #1   Title Patient will be independent with HEP    Time 4    Period Weeks    Status New      PT LONG TERM GOAL #2   Title Patient will demonstrate 4+/5 or greater right knee and right hip MMT in all planes to improve stabilty during functional tasks    Time 4    Period Weeks    Status New      PT LONG TERM GOAL #3   Title Patient will report ability to perform ADLs and work activities with right knee pain less than or equal to 3/10.    Time 4    Period Weeks    Status New      PT LONG TERM GOAL #4   Title Patient will report ability to walk community distances with right knee pain less than or equal to 3/10.    Time 4    Period Weeks    Status New                  Plan - 02/15/20 1256    Clinical Impression Statement Patient is a 59 year old female who presents to physical therapy with right knee pain, decreased right knee extension, decreased R knee MMT, and pain with ADLs and work activities that has been ongoing since R knee arthoscopy in 2020. Patient very tender to palpation throughout the knee, particularly posteriorly and laterally. Notable edema R>L with more edema in the lateral aspect. Patient and PT discussed HEP and plan of care and importance of pain free strengthening and ROM. Patient reported understanding. Patient would benefit from skilled physical therapy to address deficits and patient's goals.    Personal Factors and Comorbidities Age;Time since onset of injury/illness/exacerbation    Examination-Activity Limitations Locomotion Level;Transfers;Stairs;Stand     Examination-Participation Restrictions Cleaning;Occupation    Stability/Clinical Decision Making Stable/Uncomplicated    Clinical Decision Making Low    Rehab Potential Fair    PT Frequency 2x / week    PT Duration 4 weeks    PT Treatment/Interventions ADLs/Self Care Home Management;Iontophoresis 4mg /ml Dexamethasone;Gait training;Stair training;Functional mobility training;Therapeutic activities;Electrical Stimulation;Therapeutic exercise;Balance training;Neuromuscular re-education;Manual techniques;Passive range of motion;Patient/family education;Vasopneumatic Device;Taping;Ultrasound    PT Next Visit Plan nustep, pain free strengthening and ROM, modalities PRN for pain relief.    Consulted and Agree with Plan of Care Patient           Patient will benefit from skilled therapeutic intervention in order to improve the following deficits and impairments:  Difficulty walking, Decreased balance, Decreased activity tolerance, Decreased strength, Increased edema, Pain  Visit Diagnosis: Chronic pain of right knee  Muscle weakness (generalized)  Difficulty in walking, not elsewhere classified     Problem List Patient Active Problem List   Diagnosis Date Noted   Pain management contract agreement 05/16/2016   Insomnia 04/13/2015   BMI 26.0-26.9,adult 04/13/2015   GAD (generalized anxiety disorder) 05/25/2014   Hyperlipidemia 11/04/2012   Migraines 11/04/2012   GERD (gastroesophageal reflux disease) 11/04/2012  Fibromyalgia 11/04/2012   TMJ arthropathy 11/04/2012    Gabriela Eves, PT, DPT 02/15/2020, 1:59 PM  [facility] [address] [place], [place], [address] Phone: [phone]   Fax:  [phone]  Name: [name] MRN: [id number] Date of Birth: 08-26-1960

## 2020-02-17 ENCOUNTER — Other Ambulatory Visit: Payer: Self-pay | Admitting: Nurse Practitioner

## 2020-02-17 ENCOUNTER — Other Ambulatory Visit (HOSPITAL_COMMUNITY): Payer: Self-pay | Admitting: Family

## 2020-02-17 ENCOUNTER — Ambulatory Visit: Payer: 59 | Admitting: Physical Therapy

## 2020-02-17 DIAGNOSIS — U071 COVID-19: Secondary | ICD-10-CM

## 2020-02-17 NOTE — Progress Notes (Signed)
I connected by phone with [name] on 02/17/2020 at 6:14 PM to discuss the potential use of a new treatment for mild to moderate COVID-19 viral infection in non-hospitalized patients.  This patient is a 59 y.o. female that meets the FDA criteria for Emergency Use Authorization of COVID monoclonal antibody casirivimab/imdevimab.  Has a (+) direct SARS-CoV-2 viral test result  Has mild or moderate COVID-19   Is NOT hospitalized due to COVID-19  Is within 10 days of symptom onset  Has at least one of the high risk factor(s) for progression to severe COVID-19 and/or hospitalization as defined in EUA.  Specific high risk criteria : Neurodevelopmental disorder   Symptoms of cough, nausea, headache began 02/15/2020.   I have spoken and communicated the following to the patient or parent/caregiver regarding COVID monoclonal antibody treatment:  1. FDA has authorized the emergency use for the treatment of mild to moderate COVID-19 in adults and pediatric patients with positive results of direct SARS-CoV-2 viral testing who are 74 years of age and older weighing at least 40 kg, and who are at high risk for progressing to severe COVID-19 and/or hospitalization.  2. The significant known and potential risks and benefits of COVID monoclonal antibody, and the extent to which such potential risks and benefits are unknown.  3. Information on available alternative treatments and the risks and benefits of those alternatives, including clinical trials.  4. Patients treated with COVID monoclonal antibody should continue to self-isolate and use infection control measures (e.g., wear mask, isolate, social distance, avoid sharing personal items, clean and disinfect "high touch" surfaces, and frequent handwashing) according to CDC guidelines.   5. The patient or parent/caregiver has the option to accept or refuse COVID monoclonal antibody treatment.  After reviewing this information with the patient, The  patient agreed to proceed with receiving casirivimab\imdevimab infusion and will be provided a copy of the Fact sheet prior to receiving the infusion. [name] [name] 02/17/2020 6:14 PM

## 2020-02-18 ENCOUNTER — Other Ambulatory Visit: Payer: Self-pay | Admitting: Nurse Practitioner

## 2020-02-18 ENCOUNTER — Other Ambulatory Visit: Payer: Self-pay | Admitting: Family

## 2020-02-18 ENCOUNTER — Ambulatory Visit (HOSPITAL_COMMUNITY)
Admission: RE | Admit: 2020-02-18 | Discharge: 2020-02-18 | Disposition: A | Discharge status: Home / Self Care | Payer: 59 | Source: Ambulatory Visit | Attending: Pulmonary Disease | Admitting: Pulmonary Disease

## 2020-02-18 ENCOUNTER — Encounter: Payer: Self-pay | Admitting: Nurse Practitioner

## 2020-02-18 DIAGNOSIS — U071 COVID-19: Secondary | ICD-10-CM | POA: Insufficient documentation

## 2020-02-18 MED ORDER — ONDANSETRON HCL 4 MG PO TABS: 4.0000 mg | ORAL_TABLET | Freq: Two times a day (BID) | ORAL | 0 refills | Status: DC | PRN

## 2020-02-18 MED ORDER — ONDANSETRON HCL 4 MG/2ML IJ SOLN
4.0000 mg | Freq: Once | INTRAMUSCULAR | Status: AC
  Administered 2020-02-18: 4 mg via INTRAVENOUS
  Filled 2020-02-18: qty 2

## 2020-02-18 MED ORDER — ALBUTEROL SULFATE HFA 108 (90 BASE) MCG/ACT IN AERS: 2.0000 | INHALATION_SPRAY | Freq: Once | RESPIRATORY_TRACT | Status: DC | PRN

## 2020-02-18 MED ORDER — SODIUM CHLORIDE 0.9 % IV SOLN: INTRAVENOUS | Status: DC | PRN

## 2020-02-18 MED ORDER — SODIUM CHLORIDE 0.9 % IV SOLN
1200.0000 mg | Freq: Once | INTRAVENOUS | Status: AC
  Administered 2020-02-18: 1200 mg via INTRAVENOUS
  Filled 2020-02-18: qty 10

## 2020-02-18 MED ORDER — EPINEPHRINE 0.3 MG/0.3ML IJ SOAJ: 0.3000 mg | Freq: Once | INTRAMUSCULAR | Status: DC | PRN

## 2020-02-18 MED ORDER — FAMOTIDINE IN NACL 20-0.9 MG/50ML-% IV SOLN: 20.0000 mg | Freq: Once | INTRAVENOUS | Status: DC | PRN

## 2020-02-18 MED ORDER — METHYLPREDNISOLONE SODIUM SUCC 125 MG IJ SOLR: 125.0000 mg | Freq: Once | INTRAMUSCULAR | Status: DC | PRN

## 2020-02-18 MED ORDER — DIPHENHYDRAMINE HCL 50 MG/ML IJ SOLN: 50.0000 mg | Freq: Once | INTRAMUSCULAR | Status: DC | PRN

## 2020-02-18 NOTE — Discharge Instructions (Signed)
COVID-19 COVID-19 is a respiratory infection that is caused by a virus called severe acute respiratory syndrome coronavirus 2 (SARS-CoV-2). The disease is also known as coronavirus disease or novel coronavirus. In some people, the virus may not cause any symptoms. In others, it may cause a serious infection. The infection can get worse quickly and can lead to complications, such as:  Pneumonia, or infection of the lungs.  Acute respiratory distress syndrome or ARDS. This is a condition in which fluid build-up in the lungs prevents the lungs from filling with air and passing oxygen into the blood.  Acute respiratory failure. This is a condition in which there is not enough oxygen passing from the lungs to the body or when carbon dioxide is not passing from the lungs out of the body.  Sepsis or septic shock. This is a serious bodily reaction to an infection.  Blood clotting problems.  Secondary infections due to bacteria or fungus.  Organ failure. This is when your body's organs stop working. The virus that causes COVID-19 is contagious. This means that it can spread from person to person through droplets from coughs and sneezes (respiratory secretions). What are the causes? This illness is caused by a virus. You may catch the virus by:  Breathing in droplets from an infected person. Droplets can be spread by a person breathing, speaking, singing, coughing, or sneezing.  Touching something, like a table or a doorknob, that was exposed to the virus (contaminated) and then touching your mouth, nose, or eyes. What increases the risk? Risk for infection You are more likely to be infected with this virus if you:  Are within 6 feet (2 meters) of a person with COVID-19.  Provide care for or live with a person who is infected with COVID-19.  Spend time in crowded indoor spaces or live in shared housing. Risk for serious illness You are more likely to become seriously ill from the virus if  you:  Are 50 years of age or older. The higher your age, the more you are at risk for serious illness.  Live in a nursing home or long-term care facility.  Have cancer.  Have a long-term (chronic) disease such as: ? Chronic lung disease, including chronic obstructive pulmonary disease or asthma. ? A long-term disease that lowers your body's ability to fight infection (immunocompromised). ? Heart disease, including heart failure, a condition in which the arteries that lead to the heart become narrow or blocked (coronary artery disease), a disease which makes the heart muscle thick, weak, or stiff (cardiomyopathy). ? Diabetes. ? Chronic kidney disease. ? Sickle cell disease, a condition in which red blood cells have an abnormal "sickle" shape. ? Liver disease.  Are obese. What are the signs or symptoms? Symptoms of this condition can range from mild to severe. Symptoms may appear any time from 2 to 14 days after being exposed to the virus. They include:  A fever or chills.  A cough.  Difficulty breathing.  Headaches, body aches, or muscle aches.  Runny or stuffy (congested) nose.  A sore throat.  New loss of taste or smell. Some people may also have stomach problems, such as nausea, vomiting, or diarrhea. Other people may not have any symptoms of COVID-19. How is this diagnosed? This condition may be diagnosed based on:  Your signs and symptoms, especially if: ? You live in an area with a COVID-19 outbreak. ? You recently traveled to or from an area where the virus is common. ? You   provide care for or live with a person who was diagnosed with COVID-19. ? You were exposed to a person who was diagnosed with COVID-19.  A physical exam.  Lab tests, which may include: ? Taking a sample of fluid from the back of your nose and throat (nasopharyngeal fluid), your nose, or your throat using a swab. ? A sample of mucus from your lungs (sputum). ? Blood tests.  Imaging tests,  which may include, X-rays, CT scan, or ultrasound. How is this treated? At present, there is no medicine to treat COVID-19. Medicines that treat other diseases are being used on a trial basis to see if they are effective against COVID-19. Your health care provider will talk with you about ways to treat your symptoms. For most people, the infection is mild and can be managed at home with rest, fluids, and over-the-counter medicines. Treatment for a serious infection usually takes places in a hospital intensive care unit (ICU). It may include one or more of the following treatments. These treatments are given until your symptoms improve.  Receiving fluids and medicines through an IV.  Supplemental oxygen. Extra oxygen is given through a tube in the nose, a face mask, or a hood.  Positioning you to lie on your stomach (prone position). This makes it easier for oxygen to get into the lungs.  Continuous positive airway pressure (CPAP) or bi-level positive airway pressure (BPAP) machine. This treatment uses mild air pressure to keep the airways open. A tube that is connected to a motor delivers oxygen to the body.  Ventilator. This treatment moves air into and out of the lungs by using a tube that is placed in your windpipe.  Tracheostomy. This is a procedure to create a hole in the neck so that a breathing tube can be inserted.  Extracorporeal membrane oxygenation (ECMO). This procedure gives the lungs a chance to recover by taking over the functions of the heart and lungs. It supplies oxygen to the body and removes carbon dioxide. Follow these instructions at home: Lifestyle  If you are sick, stay home except to get medical care. Your health care provider will tell you how long to stay home. Call your health care provider before you go for medical care.  Rest at home as told by your health care provider.  Do not use any products that contain nicotine or tobacco, such as cigarettes,  e-cigarettes, and chewing tobacco. If you need help quitting, ask your health care provider.  Return to your normal activities as told by your health care provider. Ask your health care provider what activities are safe for you. General instructions  Take over-the-counter and prescription medicines only as told by your health care provider.  Drink enough fluid to keep your urine pale yellow.  Keep all follow-up visits as told by your health care provider. This is important. How is this prevented?  There is no vaccine to help prevent COVID-19 infection. However, there are steps you can take to protect yourself and others from this virus. To protect yourself:   Do not travel to areas where COVID-19 is a risk. The areas where COVID-19 is reported change often. To identify high-risk areas and travel restrictions, check the CDC travel website: wwwnc.cdc.gov/travel/notices  If you live in, or must travel to, an area where COVID-19 is a risk, take precautions to avoid infection. ? Stay away from people who are sick. ? Wash your hands often with soap and water for 20 seconds. If soap and water   are not available, use an alcohol-based hand sanitizer. ? Avoid touching your mouth, face, eyes, or nose. ? Avoid going out in public, follow guidance from your state and local health authorities. ? If you must go out in public, wear a cloth face covering or face mask. Make sure your mask covers your nose and mouth. ? Avoid crowded indoor spaces. Stay at least 6 feet (2 meters) away from others. ? Disinfect objects and surfaces that are frequently touched every day. This may include:  Counters and tables.  Doorknobs and light switches.  Sinks and faucets.  Electronics, such as phones, remote controls, keyboards, computers, and tablets. To protect others: If you have symptoms of COVID-19, take steps to prevent the virus from spreading to others.  If you think you have a COVID-19 infection, contact  your health care provider right away. Tell your health care team that you think you may have a COVID-19 infection.  Stay home. Leave your house only to seek medical care. Do not use public transport.  Do not travel while you are sick.  Wash your hands often with soap and water for 20 seconds. If soap and water are not available, use alcohol-based hand sanitizer.  Stay away from other members of your household. Let healthy household members care for children and pets, if possible. If you have to care for children or pets, wash your hands often and wear a mask. If possible, stay in your own room, separate from others. Use a different bathroom.  Make sure that all people in your household wash their hands well and often.  Cough or sneeze into a tissue or your sleeve or elbow. Do not cough or sneeze into your hand or into the air.  Wear a cloth face covering or face mask. Make sure your mask covers your nose and mouth. Where to find more information  Centers for Disease Control and Prevention: www.cdc.gov/coronavirus/2019-ncov/index.html  World Health Organization: www.who.int/health-topics/coronavirus Contact a health care provider if:  You live in or have traveled to an area where COVID-19 is a risk and you have symptoms of the infection.  You have had contact with someone who has COVID-19 and you have symptoms of the infection. Get help right away if:  You have trouble breathing.  You have pain or pressure in your chest.  You have confusion.  You have bluish lips and fingernails.  You have difficulty waking from sleep.  You have symptoms that get worse. These symptoms may represent a serious problem that is an emergency. Do not wait to see if the symptoms will go away. Get medical help right away. Call your local emergency services (911 in the [place] Do not drive yourself to the hospital. Let the emergency medical personnel know if you think you have  COVID-19. Summary  COVID-19 is a respiratory infection that is caused by a virus. It is also known as coronavirus disease or novel coronavirus. It can cause serious infections, such as pneumonia, acute respiratory distress syndrome, acute respiratory failure, or sepsis.  The virus that causes COVID-19 is contagious. This means that it can spread from person to person through droplets from breathing, speaking, singing, coughing, or sneezing.  You are more likely to develop a serious illness if you are 50 years of age or older, have a weak immune system, live in a nursing home, or have chronic disease.  There is no medicine to treat COVID-19. Your health care provider will talk with you about ways to treat your symptoms.    Take steps to protect yourself and others from infection. Wash your hands often and disinfect objects and surfaces that are frequently touched every day. Stay away from people who are sick and wear a mask if you are sick. This information is not intended to replace advice given to you by your health care provider. Make sure you discuss any questions you have with your health care provider. Document Revised: 03/26/2019 Document Reviewed: 07/02/2018 Elsevier Patient Education  2020 Elsevier Inc. What types of side effects do monoclonal antibody drugs cause?  Common side effects  In general, the more common side effects caused by monoclonal antibody drugs include: . Allergic reactions, such as hives or itching . Flu-like signs and symptoms, including chills, fatigue, fever, and muscle aches and pains . Nausea, vomiting . Diarrhea . Skin rashes . Low blood pressure   The CDC is recommending patients who receive monoclonal antibody treatments wait at least 90 days before being vaccinated.  Currently, there are no data on the safety and efficacy of mRNA COVID-19 vaccines in persons who received monoclonal antibodies or convalescent plasma as part of COVID-19 treatment. Based  on the estimated half-life of such therapies as well as evidence suggesting that reinfection is uncommon in the 90 days after initial infection, vaccination should be deferred for at least 90 days, as a precautionary measure until additional information becomes available, to avoid interference of the antibody treatment with vaccine-induced immune responses. 

## 2020-02-18 NOTE — Progress Notes (Signed)
zofran

## 2020-02-18 NOTE — Progress Notes (Signed)
  Diagnosis: COVID-19  Physician: Dr Wright   Procedure: [facility] Med: casirivimab\imdevimab infusion - Provided patient with casirivimab\imdevimab fact sheet for patients, parents and caregivers prior to infusion.  Complications: No immediate complications noted.  Discharge: Discharged home   [name] 02/18/2020  

## 2020-02-19 ENCOUNTER — Other Ambulatory Visit (HOSPITAL_COMMUNITY): Payer: Self-pay

## 2020-02-21 ENCOUNTER — Ambulatory Visit: Payer: Self-pay | Admitting: Nurse Practitioner

## 2020-02-24 ENCOUNTER — Other Ambulatory Visit: Payer: Self-pay | Admitting: Nurse Practitioner

## 2020-02-24 DIAGNOSIS — M797 Fibromyalgia: Secondary | ICD-10-CM

## 2020-02-24 NOTE — Telephone Encounter (Signed)
Usually needs to be seen for refill of pain meds. Since I am out of town I will refill. Will need an appointmnet ofr future refills.

## 2020-02-24 NOTE — Telephone Encounter (Signed)
It wont let me deny this

## 2020-02-24 NOTE — Telephone Encounter (Signed)
Patient aware and appointment scheduled.  

## 2020-03-02 ENCOUNTER — Ambulatory Visit: Payer: 59 | Admitting: Nurse Practitioner

## 2020-03-06 ENCOUNTER — Encounter: Payer: Self-pay | Admitting: Nurse Practitioner

## 2020-03-06 ENCOUNTER — Other Ambulatory Visit: Payer: Self-pay

## 2020-03-06 ENCOUNTER — Ambulatory Visit: Payer: 59 | Admitting: Nurse Practitioner

## 2020-03-06 VITALS — BP 134/93 | HR 94 | Temp 97.6°F | Resp 20 | Ht 68.0 in | Wt 185.0 lb

## 2020-03-06 DIAGNOSIS — K219 Gastro-esophageal reflux disease without esophagitis: Secondary | ICD-10-CM | POA: Diagnosis not present

## 2020-03-06 DIAGNOSIS — G43109 Migraine with aura, not intractable, without status migrainosus: Secondary | ICD-10-CM | POA: Diagnosis not present

## 2020-03-06 DIAGNOSIS — M797 Fibromyalgia: Secondary | ICD-10-CM

## 2020-03-06 DIAGNOSIS — F411 Generalized anxiety disorder: Secondary | ICD-10-CM

## 2020-03-06 DIAGNOSIS — B009 Herpesviral infection, unspecified: Secondary | ICD-10-CM

## 2020-03-06 DIAGNOSIS — E7800 Pure hypercholesterolemia, unspecified: Secondary | ICD-10-CM

## 2020-03-06 DIAGNOSIS — Z6826 Body mass index (BMI) 26.0-26.9, adult: Secondary | ICD-10-CM

## 2020-03-06 DIAGNOSIS — F5101 Primary insomnia: Secondary | ICD-10-CM

## 2020-03-06 MED ORDER — PRAVASTATIN SODIUM 40 MG PO TABS: 40.0000 mg | ORAL_TABLET | Freq: Every day | ORAL | 1 refills | Status: DC

## 2020-03-06 MED ORDER — SUMATRIPTAN SUCCINATE 100 MG PO TABS: ORAL_TABLET | ORAL | 2 refills | Status: DC

## 2020-03-06 MED ORDER — PANTOPRAZOLE SODIUM 40 MG PO TBEC: 40.0000 mg | DELAYED_RELEASE_TABLET | Freq: Two times a day (BID) | ORAL | 1 refills | Status: DC

## 2020-03-06 MED ORDER — FUROSEMIDE 20 MG PO TABS: 20.0000 mg | ORAL_TABLET | Freq: Every day | ORAL | 1 refills | Status: DC

## 2020-03-06 MED ORDER — HYDROCODONE-ACETAMINOPHEN 10-325 MG PO TABS: ORAL_TABLET | ORAL | 0 refills | Status: DC

## 2020-03-06 MED ORDER — HYDROCODONE-ACETAMINOPHEN 10-325 MG PO TABS: 1.0000 | ORAL_TABLET | Freq: Three times a day (TID) | ORAL | 0 refills | Status: DC

## 2020-03-06 MED ORDER — VALACYCLOVIR HCL 1 G PO TABS: 1000.0000 mg | ORAL_TABLET | Freq: Every day | ORAL | 1 refills | Status: DC

## 2020-03-06 NOTE — Progress Notes (Signed)
Subjective:    Patient ID: [name], female    DOB: Jun 18, 1960, 59 y.o.   MRN: [id number]   Chief Complaint: Medical Management of Chronic Issues    HPI:  1. Pure hypercholesterolemia Does try to wathc diet but is not able to d much exercise. Lab Results  Component Value Date   CHOL 143 11/18/2019   HDL 46 11/18/2019   LDLCALC 67 11/18/2019   TRIG 177 (H) 11/18/2019   CHOLHDL 3.1 11/18/2019     2. Migraine with aura and without status migrainosus, not intractable Has at least 1 migraine per week. imitrex usually helps relieve pain.  3. Gastroesophageal reflux disease, unspecified whether esophagitis present Is on protonix daily and is doing well.  4. Fibromyalgia Pain assessment: Cause of pain- fibromyalgia Pain location- varies daily Pain on scale of 1-10- 2/10 currently Frequency- daily What increases pain-weather and stress What makes pain Better-rest Effects on ADL - none Any change in general medical condition-none  Current opioids rx- norco 10/325 TID # meds rx- 90 Effectiveness of current meds-helps Adverse reactions from pain meds-none Morphine equivalent- 30MME  Pill count performed-No Last drug screen - 08/12/19 ( high risk q43m, moderate risk q63m, low risk yearly ) Urine drug screen today- No Was the [place] reviewed- yes  If yes were their any concerning findings? - none   Overdose risk: 1 Opioid Risk  08/12/2019  Alcohol 0  Illegal Drugs 0  Rx Drugs 0  Alcohol 0  Illegal Drugs 0  Rx Drugs 0  Age between 16-45 years  0  History of Preadolescent Sexual Abuse 0  Psychological Disease 0  Depression 0  Opioid Risk Tool Scoring 0  Opioid Risk Interpretation Low Risk     Pain contract signed on:08/12/19   5. GAD (generalized anxiety disorder) *is on savella and that helps some with her anxiety  6. Primary insomnia Takes melatonin if needed. Other wise she sleeps well.  7. BMI 26.0-26.9,adult No recent weight changes Wt Readings  from Last 3 Encounters:  03/06/20 185 lb (83.9 kg)  01/04/20 184 lb (83.5 kg)  11/18/19 185 lb (83.9 kg)   BMI Readings from Last 3 Encounters:  03/06/20 28.13 kg/m  01/04/20 27.98 kg/m  11/18/19 28.13 kg/m       Outpatient Encounter Medications as of 03/06/2020  Medication Sig  . Ascorbic Acid (VITAMIN C PO) Take by mouth.  . calcium carbonate (OSCAL) 1500 (600 Ca) MG TABS tablet Take by mouth daily with breakfast.  . Cholecalciferol (VITAMIN D3) 250 MCG (10000 UT) capsule Take 10,000 Units by mouth daily.  . cyclobenzaprine (FLEXERIL) 10 MG tablet 1 po TID  . EPINEPHrine 0.3 mg/0.3 mL IJ SOAJ injection Inject 0.3 mLs (0.3 mg total) into the muscle once.  Marland Kitchen estradiol (ESTRACE) 1 MG tablet Take 1 tablet (1 mg total) by mouth daily.  . furosemide (LASIX) 20 MG tablet Take 1 tablet (20 mg total) by mouth daily.  Marland Kitchen HYDROcodone-acetaminophen (NORCO) 10-325 MG tablet TAKE  (1)  TABLET  THREE TIMES DAILY.  Marland Kitchen loratadine (CLARITIN) 10 MG tablet Take 10 mg by mouth daily.  . meclizine (ANTIVERT) 25 MG tablet Take 1 tablet (25 mg total) by mouth 3 (three) times daily as needed for dizziness.  . Milnacipran (SAVELLA) 50 MG TABS tablet Take 1 tablet (50 mg total) by mouth 2 (two) times daily.  . Multiple Vitamin (MULTIVITAMIN) tablet Take 1 tablet by mouth daily.  . ondansetron (ZOFRAN) 4 MG tablet Take 1 tablet (  4 mg total) by mouth 2 (two) times daily as needed for nausea or vomiting.  . pantoprazole (PROTONIX) 40 MG tablet Take 1 tablet (40 mg total) by mouth 2 (two) times daily.  . pravastatin (PRAVACHOL) 40 MG tablet Take 1 tablet (40 mg total) by mouth daily.  . SUMAtriptan (IMITREX) 100 MG tablet TAKE 1 TABLET AT ONSET OF HEADACHE, MAY REPEAT ONCE IN 2HOURS  . valACYclovir (VALTREX) 1000 MG tablet Take 1 tablet (1,000 mg total) by mouth daily.  Marland Kitchen HYDROcodone-acetaminophen (NORCO) 10-325 MG tablet Take 1 tablet by mouth 3 (three) times daily.  Marland Kitchen HYDROcodone-acetaminophen (NORCO) 10-325  MG tablet Take 1 tablet by mouth 3 (three) times daily.   No facility-administered encounter medications on file as of 03/06/2020.    Past Surgical History:  Procedure Laterality Date  . ABDOMINAL HYSTERECTOMY    . ADNOIDS    . ANTERIOR CERVICAL DECOMP/DISCECTOMY FUSION N/A 10/27/2013   Procedure: ACDF/ANTERIOR CERVICAL DECOMPRESSION/DISCECTOMY FUSION  C5-C7  (2 LEVELS);  Surgeon: Melina Schools, MD;  Location: [facility];  Service: Orthopedics;  Laterality: N/A;  . EYE SURGERY Bilateral    cataract removal    Family History  Problem Relation Age of Onset  . Heart disease Mother   . Cancer Father        STOMACH CANCER  . Cancer Maternal Grandfather   . Diabetes Paternal Grandmother     New complaints: None today  Social history: Lives by herself  Controlled substance contract: 08/12/19    Review of Systems  Constitutional: Negative for diaphoresis.  Eyes: Negative for pain.  Respiratory: Negative for shortness of breath.   Cardiovascular: Negative for chest pain, palpitations and leg swelling.  Gastrointestinal: Negative for abdominal pain.  Endocrine: Negative for polydipsia.  Skin: Negative for rash.  Neurological: Negative for dizziness, weakness and headaches.  Hematological: Does not bruise/bleed easily.  All other systems reviewed and are negative.      Objective:   Physical Exam Vitals and nursing note reviewed.  Constitutional:      General: She is not in acute distress.    Appearance: Normal appearance. She is well-developed.  HENT:     Head: Normocephalic.     Nose: Nose normal.  Eyes:     Pupils: Pupils are equal, round, and reactive to light.  Neck:     Vascular: No carotid bruit or JVD.  Cardiovascular:     Rate and Rhythm: Normal rate and regular rhythm.     Heart sounds: Normal heart sounds.  Pulmonary:     Effort: Pulmonary effort is normal. No respiratory distress.     Breath sounds: Normal breath sounds. No wheezing or rales.  Chest:      Chest wall: No tenderness.  Abdominal:     General: Bowel sounds are normal. There is no distension or abdominal bruit.     Palpations: Abdomen is soft. There is no hepatomegaly, splenomegaly, mass or pulsatile mass.     Tenderness: There is no abdominal tenderness.  Musculoskeletal:        General: Normal range of motion.     Cervical back: Normal range of motion and neck supple.  Lymphadenopathy:     Cervical: No cervical adenopathy.  Skin:    General: Skin is warm and dry.  Neurological:     Mental Status: She is alert and oriented to person, place, and time.     Deep Tendon Reflexes: Reflexes are normal and symmetric.  Psychiatric:  Behavior: Behavior normal.        Thought Content: Thought content normal.        Judgment: Judgment normal.    BP (!) 134/93   Pulse 94   Temp 97.6 F (36.4 C) (Temporal)   Resp 20   Ht 5\' 8"  (1.727 m)   Wt 185 lb (83.9 kg)   SpO2 97%   BMI 28.13 kg/m         Assessment & Plan:  [name] comes in today with chief complaint of Medical Management of Chronic Issues   Diagnosis and orders addressed:  1. Pure hypercholesterolemia Low fat diet - pravastatin (PRAVACHOL) 40 MG tablet; Take 1 tablet (40 mg total) by mouth daily.  Dispense: 90 tablet; Refill: 1  2. Migraine with aura and without status migrainosus, not intractable Avoid caffeine - SUMAtriptan (IMITREX) 100 MG tablet; TAKE 1 TABLET AT ONSET OF HEADACHE, MAY REPEAT ONCE IN 2HOURS  Dispense: 9 tablet; Refill: 2  3. Gastroesophageal reflux disease, unspecified whether esophagitis present Avoid spicy foods Do not eat 2 hours prior to bedtime  4. Fibromyalgia Exercise to keep muscles warm - HYDROcodone-acetaminophen (NORCO) 10-325 MG tablet; TAKE  (1)  TABLET  THREE TIMES DAILY.  Dispense: 90 tablet; Refill: 0 - HYDROcodone-acetaminophen (NORCO) 10-325 MG tablet; Take 1 tablet by mouth 3 (three) times daily.  Dispense: 90 tablet; Refill: 0 -  HYDROcodone-acetaminophen (NORCO) 10-325 MG tablet; Take 1 tablet by mouth 3 (three) times daily.  Dispense: 90 tablet; Refill: 0  5. GAD (generalized anxiety disorder) Stress management  6. Primary insomnia Bedtime routine  7. BMI 26.0-26.9,adult Discussed diet and exercise for person with BMI >25 Will recheck weight in 3-6 months  8. Gastroesophageal reflux disease Avoid spicy foods Do not eat 2 hours prior to bedtime - pantoprazole (PROTONIX) 40 MG tablet; Take 1 tablet (40 mg total) by mouth 2 (two) times daily.  Dispense: 180 tablet; Refill: 1  9. HSV-2 infection - valACYclovir (VALTREX) 1000 MG tablet; Take 1 tablet (1,000 mg total) by mouth daily.  Dispense: 90 tablet; Refill: 1   Labs pending Health Maintenance reviewed Diet and exercise encouraged  Follow up plan: 4 months   Mary-Margaret Hassell Done, FNP

## 2020-03-06 NOTE — Patient Instructions (Signed)
Stress, Adult Stress is a normal reaction to life events. Stress is what you feel when life demands more than you are used to, or more than you think you can handle. Some stress can be useful, such as studying for a test or meeting a deadline at work. Stress that occurs too often or for too long can cause problems. It can affect your emotional health and interfere with relationships and normal daily activities. Too much stress can weaken your body's defense system (immune system) and increase your risk for physical illness. If you already have a medical problem, stress can make it worse. What are the causes? All sorts of life events can cause stress. An event that causes stress for one person may not be stressful for another person. Major life events, whether positive or negative, commonly cause stress. Examples include:  Losing a job or starting a new job.  Losing a loved one.  Moving to a new town or home.  Getting married or divorced.  Having a baby.  Getting injured or sick. Less obvious life events can also cause stress, especially if they occur day after day or in combination with each other. Examples include:  Working long hours.  Driving in traffic.  Caring for children.  Being in debt.  Being in a difficult relationship. What are the signs or symptoms? Stress can cause emotional symptoms, including:  Anxiety. This is feeling worried, afraid, on edge, overwhelmed, or out of control.  Anger, including irritation or impatience.  Depression. This is feeling sad, down, helpless, or guilty.  Trouble focusing, remembering, or making decisions. Stress can cause physical symptoms, including:  Aches and pains. These may affect your head, neck, back, stomach, or other areas of your body.  Tight muscles or a clenched jaw.  Low energy.  Trouble sleeping. Stress can cause unhealthy behaviors, including:  Eating to feel better (overeating) or skipping meals.  Working too  much or putting off tasks.  Smoking, drinking alcohol, or using drugs to feel better. How is this diagnosed? Stress is diagnosed through an assessment by your health care provider. He or she may diagnose this condition based on:  Your symptoms and any stressful life events.  Your medical history.  Tests to rule out other causes of your symptoms. Depending on your condition, your health care provider may refer you to a specialist for further evaluation. How is this treated?  Stress management techniques are the recommended treatment for stress. Medicine is not typically recommended for the treatment of stress. Techniques to reduce your reaction to stressful life events include:  Stress identification. Monitor yourself for symptoms of stress and identify what causes stress for you. These skills may help you to avoid or prepare for stressful events.  Time management. Set your priorities, keep a calendar of events, and learn to say no. Taking these actions can help you avoid making too many commitments. Techniques for coping with stress include:  Rethinking the problem. Try to think realistically about stressful events rather than ignoring them or overreacting. Try to find the positives in a stressful situation rather than focusing on the negatives.  Exercise. Physical exercise can release both physical and emotional tension. The key is to find a form of exercise that you enjoy and do it regularly.  Relaxation techniques. These relax the body and mind. The key is to find one or more that you enjoy and use the techniques regularly. Examples include: ? Meditation, deep breathing, or progressive relaxation techniques. ? Yoga or   tai chi. ? Biofeedback, mindfulness techniques, or journaling. ? Listening to music, being out in nature, or participating in other hobbies.  Practicing a healthy lifestyle. Eat a balanced diet, drink plenty of water, limit or avoid caffeine, and get plenty of  sleep.  Having a strong support network. Spend time with family, friends, or other people you enjoy being around. Express your feelings and talk things over with someone you trust. Counseling or talk therapy with a mental health professional may be helpful if you are having trouble managing stress on your own. Follow these instructions at home: Lifestyle   Avoid drugs.  Do not use any products that contain nicotine or tobacco, such as cigarettes, e-cigarettes, and chewing tobacco. If you need help quitting, ask your health care provider.  Limit alcohol intake to no more than 1 drink a day for nonpregnant women and 2 drinks a day for men. One drink equals 12 oz of beer, 5 oz of wine, or 1 oz of hard liquor  Do not use alcohol or drugs to relax.  Eat a balanced diet that includes fresh fruits and vegetables, whole grains, lean meats, fish, eggs, and beans, and low-fat dairy. Avoid processed foods and foods high in added fat, sugar, and salt.  Exercise at least 30 minutes on 5 or more days each week.  Get 7-8 hours of sleep each night. General instructions   Practice stress management techniques as discussed with your health care provider.  Drink enough fluid to keep your urine clear or pale yellow.  Take over-the-counter and prescription medicines only as told by your health care provider.  Keep all follow-up visits as told by your health care provider. This is important. Contact a health care provider if:  Your symptoms get worse.  You have new symptoms.  You feel overwhelmed by your problems and can no longer manage them on your own. Get help right away if:  You have thoughts of hurting yourself or others. If you ever feel like you may hurt yourself or others, or have thoughts about taking your own life, get help right away. You can go to your nearest emergency department or call:  Your local emergency services (911 in the [place]  A suicide crisis helpline, such as the  [place] at (316) 250-6172. This is open 24 hours a day. Summary  Stress is a normal reaction to life events. It can cause problems if it happens too often or for too long.  Practicing stress management techniques is the best way to treat stress.  Counseling or talk therapy with a mental health professional may be helpful if you are having trouble managing stress on your own. This information is not intended to replace advice given to you by your health care provider. Make sure you discuss any questions you have with your health care provider. Document Revised: 12/25/2018 Document Reviewed: 07/17/2016 Elsevier Patient Education  [place].

## 2020-04-21 ENCOUNTER — Ambulatory Visit: Payer: 59 | Admitting: Nurse Practitioner

## 2020-05-10 ENCOUNTER — Encounter: Payer: Self-pay | Admitting: Gastroenterology

## 2020-05-10 ENCOUNTER — Ambulatory Visit: Payer: 59 | Admitting: Gastroenterology

## 2020-05-10 VITALS — BP 98/62 | HR 80 | Ht 68.5 in | Wt 184.2 lb

## 2020-05-10 DIAGNOSIS — R110 Nausea: Secondary | ICD-10-CM

## 2020-05-10 DIAGNOSIS — K625 Hemorrhage of anus and rectum: Secondary | ICD-10-CM | POA: Diagnosis not present

## 2020-05-10 DIAGNOSIS — K219 Gastro-esophageal reflux disease without esophagitis: Secondary | ICD-10-CM

## 2020-05-10 MED ORDER — ONDANSETRON 4 MG PO TBDP: 4.0000 mg | ORAL_TABLET | Freq: Three times a day (TID) | ORAL | 1 refills | Status: DC | PRN

## 2020-05-10 MED ORDER — SUTAB 1479-225-188 MG PO TABS: 1.0000 | ORAL_TABLET | Freq: Once | ORAL | 0 refills | Status: AC

## 2020-05-10 MED ORDER — DEXLANSOPRAZOLE 60 MG PO CPDR: 60.0000 mg | DELAYED_RELEASE_CAPSULE | Freq: Every day | ORAL | 5 refills | Status: DC

## 2020-05-10 NOTE — Progress Notes (Signed)
Reviewed and agree with management plan.  Mariachristina Holle T. Darrick Greenlaw, MD FACG [facility]  

## 2020-05-10 NOTE — Progress Notes (Signed)
05/10/2020 [name] [id number] 1961-06-06   HISTORY OF PRESENT ILLNESS: This is a 59 year old female who is a patient of Dr. Lynne Leader.  She is here today with a couple different complaints.  She tells me that about 8 weeks ago she had sudden onset of lower abdominal pain.  She said she had normal bowel movements, but they were covered in blood.  This lasted about 2 to 3 days and then resolved.  She denies seeing any blood since that time.  She says that it did scare her.  Her last colonoscopy was in 2010 with Dr. Laural Golden.  She also reports that she has constant nausea.  She uses Zofran as needed and that does seem to help.  She is asking for refill of that medication.  She is on pantoprazole 40 mg twice daily and admits that she still does get some acid reflux on occasion despite her regimen.  She had an EGD in May 2016 with Dr. Fuller Plan that showed grade a esophagitis and hiatal hernia.  She would like to have any procedures performed by the end of the year if possible as she is undergoing replacement surgery in January.   Past Medical History:  Diagnosis Date  . Arthritis    right hand  . Dyslipidemia   . Family history of anesthesia complication    patients mother has severe nausea and vomiting after  . Fibromyalgia   . GERD (gastroesophageal reflux disease)   . Guillain-Barre syndrome ([place])   . Hiatal hernia   . Kidney stones   . Migraines   . PONV (postoperative nausea and vomiting)   . TMJ (dislocation of temporomandibular joint)   . UTI (urinary tract infection)    hx of  . Vitamin D deficiency    Past Surgical History:  Procedure Laterality Date  . ABDOMINAL HYSTERECTOMY    . ADNOIDS    . ANTERIOR CERVICAL DECOMP/DISCECTOMY FUSION N/A 10/27/2013   Procedure: ACDF/ANTERIOR CERVICAL DECOMPRESSION/DISCECTOMY FUSION  C5-C7  (2 LEVELS);  Surgeon: Melina Schools, MD;  Location: [facility];  Service: Orthopedics;  Laterality: N/A;  . EYE SURGERY Bilateral    cataract removal     reports that she has quit smoking. She has a 15.00 pack-year smoking history. She has never used smokeless tobacco. She reports current alcohol use of about 1.0 standard drink of alcohol per week. She reports that she does not use drugs. family history includes Cancer in her father and maternal grandfather; Diabetes in her paternal grandmother; Heart disease in her mother. Allergies  Allergen Reactions  . Other Anaphylaxis    Plastic bottles; patient drank from a water bottle and had to use Epi pen  . Peanuts [Peanut Oil] Anaphylaxis  . Statins Other (See Comments)    Severe joint pain  . Tape Other (See Comments)    Causes red blisters on skin. Can use paper tape      Outpatient Encounter Medications as of 05/10/2020  Medication Sig  . Ascorbic Acid (VITAMIN C PO) Take by mouth.  . calcium carbonate (OSCAL) 1500 (600 Ca) MG TABS tablet Take by mouth daily with breakfast.  . Cholecalciferol (VITAMIN D3) 250 MCG (10000 UT) capsule Take 10,000 Units by mouth daily.  . cyclobenzaprine (FLEXERIL) 10 MG tablet 1 po TID  . EPINEPHrine 0.3 mg/0.3 mL IJ SOAJ injection Inject 0.3 mLs (0.3 mg total) into the muscle once.  Marland Kitchen estradiol (ESTRACE) 1 MG tablet Take 1 tablet (1 mg total) by mouth daily.  Marland Kitchen  furosemide (LASIX) 20 MG tablet Take 1 tablet (20 mg total) by mouth daily.  Derrill Memo ON 05/24/2020] HYDROcodone-acetaminophen (NORCO) 10-325 MG tablet TAKE  (1)  TABLET  THREE TIMES DAILY.  Marland Kitchen HYDROcodone-acetaminophen (NORCO) 10-325 MG tablet Take 1 tablet by mouth 3 (three) times daily.  Marland Kitchen HYDROcodone-acetaminophen (NORCO) 10-325 MG tablet Take 1 tablet by mouth 3 (three) times daily.  Marland Kitchen loratadine (CLARITIN) 10 MG tablet Take 10 mg by mouth daily.  . Milnacipran (SAVELLA) 50 MG TABS tablet Take 1 tablet (50 mg total) by mouth 2 (two) times daily.  . Multiple Vitamin (MULTIVITAMIN) tablet Take 1 tablet by mouth daily.  . ondansetron (ZOFRAN) 4 MG tablet Take 1 tablet (4 mg total) by mouth 2 (two)  times daily as needed for nausea or vomiting.  . pantoprazole (PROTONIX) 40 MG tablet Take 1 tablet (40 mg total) by mouth 2 (two) times daily.  . pravastatin (PRAVACHOL) 40 MG tablet Take 1 tablet (40 mg total) by mouth daily.  . SUMAtriptan (IMITREX) 100 MG tablet TAKE 1 TABLET AT ONSET OF HEADACHE, MAY REPEAT ONCE IN 2HOURS  . [DISCONTINUED] meclizine (ANTIVERT) 25 MG tablet Take 1 tablet (25 mg total) by mouth 3 (three) times daily as needed for dizziness.  . [DISCONTINUED] valACYclovir (VALTREX) 1000 MG tablet Take 1 tablet (1,000 mg total) by mouth daily.   No facility-administered encounter medications on file as of 05/10/2020.     REVIEW OF SYSTEMS  : All other systems reviewed and negative except where noted in the History of Present Illness.   PHYSICAL EXAM: BP 98/62 (BP Location: Left Arm, Patient Position: Sitting, Cuff Size: Normal)   Pulse 80   Ht 5' 8.5" (1.74 m) Comment: height measured without shoes  Wt 184 lb 4 oz (83.6 kg)   BMI 27.60 kg/m  General: Well developed white female in no acute distress Head: Normocephalic and atraumatic Eyes:  Sclerae anicteric, conjunctiva pink. Ears: Normal auditory acuity Lungs: Clear throughout to auscultation; no W/R/R. Heart: Regular rate and rhythm; no M/R/G. Abdomen: Soft, non-distended.  BS present.  Non-tender. Rectal:  Will be done at the time of colonoscopy. Musculoskeletal: Symmetrical with no gross deformities  Skin: No lesions on visible extremities Extremities: No edema  Neurological: Alert oriented x 4, grossly non-focal Psychological:  Alert and cooperative. Normal mood and affect  ASSESSMENT AND PLAN: *Rectal bleeding: Had a couple of days where she saw a moderate amount of blood in her stool back a couple of months ago.  None since that time.  Her last colonoscopy was 11 years ago.  We will plan for colonoscopy. *Constant nausea and history of GERD: Is currently on pantoprazole 40 mg twice daily.  Uses Zofran as  needed for nausea which does seem to help, but she is out of that prescription and is asking to have it refilled.  I am going to have her discontinue the pantoprazole and she will try Dexilant 60 mg daily instead.  Prescriptions for Dexilant and Zofran were sent to her pharmacy.  We will plan for EGD at the time of her colonoscopy as well.  These are being scheduled Dr. Carlean Purl as she would like them done before the end of the year since she is undergoing knee replacement surgery in January.  The risks, benefits, and alternatives to EGD and colonoscopy were discussed with the patient and they consent to proceed.    CC:  Hassell Done, Mary-Margaret, *

## 2020-05-10 NOTE — Patient Instructions (Signed)
If you are age 61 or older, your body mass index should be between 23-30. Your Body mass index is 27.6 kg/m. If this is out of the aforementioned range listed, please consider follow up with your Primary Care Provider.  If you are age 33 or younger, your body mass index should be between 19-25. Your Body mass index is 27.6 kg/m. If this is out of the aformentioned range listed, please consider follow up with your Primary Care Provider.   Stop Protonix.  We have sent the following medications to your pharmacy for you to pick up at your convenience: Dexilant 60 mg daily. Zofran 4 mg every 8 hours as needed for nausea.   You have been scheduled for an endoscopy and colonoscopy. Please follow the written instructions given to you at your visit today. Please pick up your prep supplies at the pharmacy within the next 1-3 days. If you use inhalers (even only as needed), please bring them with you on the day of your procedure.  Due to recent changes in healthcare laws, you may see the results of your imaging and laboratory studies on MyChart before your provider has had a chance to review them.  We understand that in some cases there may be results that are confusing or concerning to you. Not all laboratory results come back in the same time frame and the provider may be waiting for multiple results in order to interpret others.  Please give [place] 48 hours in order for your provider to thoroughly review all the results before contacting the office for clarification of your results.   Thank you for choosing me and [facility].  Alonza Bogus, PA-C

## 2020-05-24 ENCOUNTER — Other Ambulatory Visit: Payer: Self-pay | Admitting: Internal Medicine

## 2020-05-24 ENCOUNTER — Encounter: Payer: Self-pay | Admitting: Gastroenterology

## 2020-05-25 ENCOUNTER — Encounter: Payer: Self-pay | Admitting: Certified Registered Nurse Anesthetist

## 2020-05-25 LAB — SARS CORONAVIRUS 2 (TAT 6-24 HRS): SARS Coronavirus 2: NEGATIVE

## 2020-05-26 ENCOUNTER — Other Ambulatory Visit: Payer: Self-pay

## 2020-05-26 ENCOUNTER — Encounter: Payer: Self-pay | Admitting: Internal Medicine

## 2020-05-26 ENCOUNTER — Ambulatory Visit (AMBULATORY_SURGERY_CENTER): Payer: 59 | Admitting: Internal Medicine

## 2020-05-26 VITALS — BP 148/81 | HR 64 | Temp 97.5°F | Resp 10 | Ht 68.5 in | Wt 184.0 lb

## 2020-05-26 DIAGNOSIS — K5730 Diverticulosis of large intestine without perforation or abscess without bleeding: Secondary | ICD-10-CM

## 2020-05-26 DIAGNOSIS — K2900 Acute gastritis without bleeding: Secondary | ICD-10-CM

## 2020-05-26 DIAGNOSIS — R110 Nausea: Secondary | ICD-10-CM

## 2020-05-26 DIAGNOSIS — K648 Other hemorrhoids: Secondary | ICD-10-CM

## 2020-05-26 DIAGNOSIS — K3189 Other diseases of stomach and duodenum: Secondary | ICD-10-CM

## 2020-05-26 DIAGNOSIS — D120 Benign neoplasm of cecum: Secondary | ICD-10-CM

## 2020-05-26 DIAGNOSIS — D123 Benign neoplasm of transverse colon: Secondary | ICD-10-CM

## 2020-05-26 DIAGNOSIS — K449 Diaphragmatic hernia without obstruction or gangrene: Secondary | ICD-10-CM | POA: Diagnosis not present

## 2020-05-26 DIAGNOSIS — K319 Disease of stomach and duodenum, unspecified: Secondary | ICD-10-CM

## 2020-05-26 DIAGNOSIS — K625 Hemorrhage of anus and rectum: Secondary | ICD-10-CM

## 2020-05-26 DIAGNOSIS — K219 Gastro-esophageal reflux disease without esophagitis: Secondary | ICD-10-CM

## 2020-05-26 HISTORY — PX: COLONOSCOPY: SHX174

## 2020-05-26 HISTORY — PX: UPPER GASTROINTESTINAL ENDOSCOPY: SHX188

## 2020-05-26 MED ORDER — SODIUM CHLORIDE 0.9 % IV SOLN: 500.0000 mL | Freq: Once | INTRAVENOUS | Status: DC

## 2020-05-26 NOTE — Progress Notes (Signed)
Called to room to assist during endoscopic procedure.  Patient ID and intended procedure confirmed with present staff. Received instructions for my participation in the procedure from the performing physician.  

## 2020-05-26 NOTE — Patient Instructions (Addendum)
The stomach is inflamed and irritated. We call that gastritis. I took biopsies to try to understand that better. You have a small hiatal hernia, also.  The colonoscopy revealed some internal hemorrhoids and diverticulosis - either of these could have been the source of bleeding. diverticulosis can also cause abdominal pain and constipation.  I did also remove 2 small polyps that look benign.  We will contact you with results and plans.  I appreciate the opportunity to care for you. Gatha Mayer, MD, [facility]  Hiatal hernia, gastritis, diverticulosis, hemorrhoid,high fiber diet and polyp handouts given to patient. Resume previous diet. Continue present medications.  Await pathology results.  Repeat colonscopy recommended for surveillance.  Date to be determined after pathology results reviewed.  YOU HAD AN ENDOSCOPIC PROCEDURE TODAY AT [facility]:   Refer to the procedure report that was given to you for any specific questions about what was found during the examination.  If the procedure report does not answer your questions, please call your gastroenterologist to clarify.  If you requested that your care partner not be given the details of your procedure findings, then the procedure report has been included in a sealed envelope for you to review at your convenience later.  YOU SHOULD EXPECT: Some feelings of bloating in the abdomen. Passage of more gas than usual.  Walking can help get rid of the air that was put into your GI tract during the procedure and reduce the bloating. If you had a lower endoscopy (such as a colonoscopy or flexible sigmoidoscopy) you may notice spotting of blood in your stool or on the toilet paper. If you underwent a bowel prep for your procedure, you may not have a normal bowel movement for a few days.  Please Note:  You might notice some irritation and congestion in your nose or some drainage.  This is from the oxygen used during your procedure.   There is no need for concern and it should clear up in a day or so.  SYMPTOMS TO REPORT IMMEDIATELY:   Following lower endoscopy (colonoscopy or flexible sigmoidoscopy):  Excessive amounts of blood in the stool  Significant tenderness or worsening of abdominal pains  Swelling of the abdomen that is new, acute  Fever of 100F or higher   Following upper endoscopy (EGD)  Vomiting of blood or coffee ground material  New chest pain or pain under the shoulder blades  Painful or persistently difficult swallowing  New shortness of breath  Fever of 100F or higher  Black, tarry-looking stools  For urgent or emergent issues, a gastroenterologist can be reached at any hour by calling [phone]. Do not use MyChart messaging for urgent concerns.    DIET:  We do recommend a small meal at first, but then you may proceed to your regular diet.  Drink plenty of fluids but you should avoid alcoholic beverages for 24 hours.  ACTIVITY:  You should plan to take it easy for the rest of today and you should NOT DRIVE or use heavy machinery until tomorrow (because of the sedation medicines used during the test).    FOLLOW UP: Our staff will call the number listed on your records 48-72 hours following your procedure to check on you and address any questions or concerns that you may have regarding the information given to you following your procedure. If we do not reach you, we will leave a message.  We will attempt to reach you two times.  During this  call, we will ask if you have developed any symptoms of COVID 19. If you develop any symptoms (ie: fever, flu-like symptoms, shortness of breath, cough etc.) before then, please call [phone].  If you test positive for Covid 19 in the 2 weeks post procedure, please call and report this information to [place].    If any biopsies were taken you will be contacted by phone or by letter within the next 1-3 weeks.  Please call us at [phone] if you have not  heard about the biopsies in 3 weeks.    SIGNATURES/CONFIDENTIALITY: You and/or your care partner have signed paperwork which will be entered into your electronic medical record.  These signatures attest to the fact that that the information above on your After Visit Summary has been reviewed and is understood.  Full responsibility of the confidentiality of this discharge information lies with you and/or your care-partner.

## 2020-05-26 NOTE — Op Note (Signed)
[facility] Patient Name: [name] Procedure Date: 05/26/2020 3:24 PM MRN: [id number] Endoscopist: Gatha Mayer , MD Age: 59 Referring MD:  Date of Birth: [birth year] Gender: Female Account #: [id number] Procedure:                Colonoscopy Indications:              Rectal bleeding Medicines:                Propofol per Anesthesia, Monitored Anesthesia Care Procedure:                Pre-Anesthesia Assessment:                           - Prior to the procedure, a History and Physical                            was performed, and patient medications and                            allergies were reviewed. The patient's tolerance of                            previous anesthesia was also reviewed. The risks                            and benefits of the procedure and the sedation                            options and risks were discussed with the patient.                            All questions were answered, and informed consent                            was obtained. Prior Anticoagulants: The patient has                            taken no previous anticoagulant or antiplatelet                            agents. ASA Grade Assessment: II - A patient with                            mild systemic disease. After reviewing the risks                            and benefits, the patient was deemed in                            satisfactory condition to undergo the procedure.                           After obtaining informed consent, the colonoscope  was passed under direct vision. Throughout the                            procedure, the patient's blood pressure, pulse, and                            oxygen saturations were monitored continuously. The                            Olympus PFC-[id number]) Colonoscope was                            introduced through the anus and advanced to the the                            cecum, identified by  appendiceal orifice and                            ileocecal valve. The colonoscopy was somewhat                            difficult due to significant looping and a tortuous                            colon. Successful completion of the procedure was                            aided by straightening and shortening the scope to                            obtain bowel loop reduction and applying abdominal                            pressure. The patient tolerated the procedure well.                            The quality of the bowel preparation was good. The                            ileocecal valve, appendiceal orifice, and rectum                            were photographed. The bowel preparation used was                            suTab via split dose instruction. Scope In: 3:41:15 PM Scope Out: 4:06:24 PM Scope Withdrawal Time: 0 hours 16 minutes 26 seconds  Total Procedure Duration: 0 hours 25 minutes 9 seconds  Findings:                 The perianal and digital rectal examinations were                            normal.  Two sessile polyps were found in the transverse                            colon and cecum. The polyps were diminutive in                            size. These polyps were removed with a cold snare.                            Resection and retrieval were complete. Verification                            of patient identification for the specimen was                            done. Estimated blood loss was minimal.                           Many small and large-mouthed diverticula were found                            in the sigmoid colon and descending colon. There                            was narrowing of the colon in association with the                            diverticular opening.                           Internal hemorrhoids were found.                           The exam was otherwise without abnormality on                             direct and retroflexion views. Complications:            No immediate complications. Estimated Blood Loss:     Estimated blood loss was minimal. Impression:               - Two diminutive polyps in the transverse colon and                            in the cecum, removed with a cold snare. Resected                            and retrieved.                           - Severe diverticulosis in the sigmoid colon and in                            the descending colon. There was narrowing of the  colon in association with the diverticular opening.                           - Internal hemorrhoids.                           - The examination was otherwise normal on direct                            and retroflexion views.                           - Personal history of colonic polyp adenoma 2010                           Bleeding could have been from diverticulosis vs                            hemorrhoids or she could have had an episode of                            non-occlusive ischemc colitis Recommendation:           - Patient has a contact number available for                            emergencies. The signs and symptoms of potential                            delayed complications were discussed with the                            patient. Return to normal activities tomorrow.                            Written discharge instructions were provided to the                            patient.                           - High fiber diet.                           - Continue present medications.                           - Repeat colonoscopy is recommended for                            surveillance. The colonoscopy date will be                            determined after pathology results from today's                            exam  become available for review. Gatha Mayer, MD 05/26/2020 4:26:13 PM This report has been signed electronically.

## 2020-05-26 NOTE — Progress Notes (Signed)
A/ox3, pleased with MAC, report to RN 

## 2020-05-26 NOTE — Op Note (Signed)
[facility] Patient Name: [name] Procedure Date: 05/26/2020 3:25 PM MRN: [id number] Endoscopist: Gatha Mayer , MD Age: 59 Referring MD:  Date of Birth: [birth year] Gender: Female Account #: [id number] Procedure:                Upper GI endoscopy Indications:              Nausea Medicines:                Propofol per Anesthesia, Monitored Anesthesia Care Procedure:                Pre-Anesthesia Assessment:                           - Prior to the procedure, a History and Physical                            was performed, and patient medications and                            allergies were reviewed. The patient's tolerance of                            previous anesthesia was also reviewed. The risks                            and benefits of the procedure and the sedation                            options and risks were discussed with the patient.                            All questions were answered, and informed consent                            was obtained. Prior Anticoagulants: The patient has                            taken no previous anticoagulant or antiplatelet                            agents. ASA Grade Assessment: II - A patient with                            mild systemic disease. After reviewing the risks                            and benefits, the patient was deemed in                            satisfactory condition to undergo the procedure.                           After obtaining informed consent, the endoscope was  passed under direct vision. Throughout the                            procedure, the patient's blood pressure, pulse, and                            oxygen saturations were monitored continuously. The                            Endoscope was introduced through the mouth, and                            advanced to the second part of duodenum. The upper                            GI endoscopy was  accomplished without difficulty.                            The patient tolerated the procedure well. Scope In: Scope Out: Findings:                 The examined esophagus was normal.                           A 3 cm hiatal hernia was present.                           Diffuse moderate inflammation characterized by                            congestion (edema), erosions, erythema, friability                            and granularity was found in the gastric body and                            in the gastric antrum. Biopsies were taken with a                            cold forceps for histology. Verification of patient                            identification for the specimen was done. Estimated                            blood loss was minimal.                           The cardia and gastric fundus were otherwise normal                            on retroflexion.                           The examined duodenum was normal. Complications:  No immediate complications. Estimated Blood Loss:     Estimated blood loss was minimal. Impression:               - Normal esophagus.                           - 3 cm hiatal hernia.                           - Gastritis. Biopsied.                           - Normal examined duodenum.                           - Narcotics may be a source of increased nausea Recommendation:           - Patient has a contact number available for                            emergencies. The signs and symptoms of potential                            delayed complications were discussed with the                            patient. Return to normal activities tomorrow.                            Written discharge instructions were provided to the                            patient.                           - Resume previous diet.                           - Continue present medications.                           - Await pathology results.                            - See the other procedure note for documentation of                            additional recommendations. Gatha Mayer, MD 05/26/2020 4:20:22 PM This report has been signed electronically.

## 2020-05-30 ENCOUNTER — Telehealth: Payer: Self-pay

## 2020-05-30 NOTE — Telephone Encounter (Signed)
°  Follow up Call-  Call back number 05/26/2020  Post procedure Call Back phone  # [phone]  Permission to leave phone message Yes  Some recent data might be hidden     Patient questions:  Do you have a fever, pain , or abdominal swelling? No. Pain Score  0 *  Have you tolerated food without any problems? Yes.    Have you been able to return to your normal activities? Yes.    Do you have any questions about your discharge instructions: Diet   No. Medications  No. Follow up visit  No.  Do you have questions or concerns about your Care? No.  Actions: * If pain score is 4 or above: No action needed, pain <4.  1. Have you developed a fever since your procedure? no  2.   Have you had an respiratory symptoms (SOB or cough) since your procedure? no  3.   Have you tested positive for COVID 19 since your procedure no  4.   Have you had any family members/close contacts diagnosed with the COVID 19 since your procedure?  no   If yes to any of these questions please route to Joylene John, RN and Joella Prince, RN

## 2020-06-06 ENCOUNTER — Encounter: Payer: Self-pay | Admitting: Internal Medicine

## 2020-06-16 ENCOUNTER — Other Ambulatory Visit: Payer: Self-pay

## 2020-06-16 ENCOUNTER — Ambulatory Visit: Payer: 59 | Admitting: Nurse Practitioner

## 2020-06-16 ENCOUNTER — Encounter: Payer: Self-pay | Admitting: Nurse Practitioner

## 2020-06-16 VITALS — BP 131/87 | HR 73 | Temp 97.2°F | Resp 20 | Ht 68.0 in | Wt 182.0 lb

## 2020-06-16 DIAGNOSIS — M797 Fibromyalgia: Secondary | ICD-10-CM | POA: Diagnosis not present

## 2020-06-16 DIAGNOSIS — G43109 Migraine with aura, not intractable, without status migrainosus: Secondary | ICD-10-CM | POA: Diagnosis not present

## 2020-06-16 DIAGNOSIS — F411 Generalized anxiety disorder: Secondary | ICD-10-CM | POA: Diagnosis not present

## 2020-06-16 DIAGNOSIS — Z6826 Body mass index (BMI) 26.0-26.9, adult: Secondary | ICD-10-CM

## 2020-06-16 DIAGNOSIS — F5101 Primary insomnia: Secondary | ICD-10-CM

## 2020-06-16 DIAGNOSIS — K219 Gastro-esophageal reflux disease without esophagitis: Secondary | ICD-10-CM

## 2020-06-16 DIAGNOSIS — N951 Menopausal and female climacteric states: Secondary | ICD-10-CM

## 2020-06-16 DIAGNOSIS — E7800 Pure hypercholesterolemia, unspecified: Secondary | ICD-10-CM

## 2020-06-16 MED ORDER — HYDROCODONE-ACETAMINOPHEN 10-325 MG PO TABS: ORAL_TABLET | ORAL | 0 refills | Status: DC

## 2020-06-16 MED ORDER — HYDROCODONE-ACETAMINOPHEN 10-325 MG PO TABS: 1.0000 | ORAL_TABLET | Freq: Three times a day (TID) | ORAL | 0 refills | Status: DC

## 2020-06-16 MED ORDER — KETOROLAC TROMETHAMINE 60 MG/2ML IM SOLN
60.0000 mg | Freq: Once | INTRAMUSCULAR | Status: AC
Start: 2020-06-16 — End: 2020-06-16
  Administered 2020-06-16: 60 mg via INTRAMUSCULAR

## 2020-06-16 MED ORDER — TRAZODONE HCL 50 MG PO TABS: 25.0000 mg | ORAL_TABLET | Freq: Every evening | ORAL | 3 refills | Status: DC | PRN

## 2020-06-16 MED ORDER — FUROSEMIDE 20 MG PO TABS: 20.0000 mg | ORAL_TABLET | Freq: Every day | ORAL | 1 refills | Status: DC

## 2020-06-16 MED ORDER — ESTRADIOL 1 MG PO TABS: 1.0000 mg | ORAL_TABLET | Freq: Every day | ORAL | 1 refills | Status: DC

## 2020-06-16 MED ORDER — DEXLANSOPRAZOLE 60 MG PO CPDR: 60.0000 mg | DELAYED_RELEASE_CAPSULE | Freq: Every day | ORAL | 1 refills | Status: DC

## 2020-06-16 MED ORDER — PRAVASTATIN SODIUM 40 MG PO TABS: 40.0000 mg | ORAL_TABLET | Freq: Every day | ORAL | 1 refills | Status: DC

## 2020-06-16 NOTE — Progress Notes (Signed)
Subjective:    Patient ID: [name], female    DOB: 1960/12/13, 60 y.o.   MRN: [id number]   Chief Complaint: Medical Management of Chronic Issues    HPI:  1. Pure hypercholesterolemia Does try to watch diet but does not do much exercise. Lab Results  Component Value Date   CHOL 143 11/18/2019   HDL 46 11/18/2019   LDLCALC 67 11/18/2019   TRIG 177 (H) 11/18/2019   CHOLHDL 3.1 11/18/2019     2. Migraine with aura and without status migrainosus, not intractable She uses imitrex when she needs it. Has migraine about 2 x a month  3. Gastroesophageal reflux disease, unspecified whether esophagitis present Takes dexilant which works well for her gerd symptoms.  4. GAD (generalized anxiety disorder) She is currently on no anti anxiety meds. She stays stressed because her om is not doing well and she is having to spend a lot of time helping to take care of her. This has increased her anxiety.  5. Primary insomnia She has lots of problems sleeping at night. Cannot do ambbien due to pain  Medication. She says she needs something.  6. Fibromyalgia Pain assessment: Cause of pain- fibromyalgia Pain location- varies from day to day but back is always hurting Pain on scale of 1-10- 5/10 Frequency- daily What increases pain-npthing really, just some days are worse then others. What makes pain Better-rest and toradol really helps her alot Effects on ADL - none Any change in general medical condition-none  Current opioids rx- norco 10/325 TID # meds rx- 90 Effectiveness of current meds-helps make pain tolerable. DOes not completely relieve her pain Adverse reactions from pain meds-none Morphine equivalent- 30MME  Pill count performed-No Last drug screen - 08/12/19 ( high risk q5947m, moderate risk q8264m, low risk yearly ) Urine drug screen today- No Was the NCCSR reviewed- yes  If yes were their any concerning findings? - no   Overdose risk: 1 Opioid Risk  08/12/2019  Alcohol  0  Illegal Drugs 0  Rx Drugs 0  Alcohol 0  Illegal Drugs 0  Rx Drugs 0  Age between 16-45 years  0  History of Preadolescent Sexual Abuse 0  Psychological Disease 0  Depression 0  Opioid Risk Tool Scoring 0  Opioid Risk Interpretation Low Risk     Pain contract signed on: 08/18/58   7. BMI 26.0-26.9,adult No recent weight changes Wt Readings from Last 3 Encounters:  06/16/20 182 lb (82.6 kg)  05/26/20 184 lb (83.5 kg)  05/10/20 184 lb 4 oz (83.6 kg)   BMI Readings from Last 3 Encounters:  06/16/20 27.67 kg/m  05/26/20 27.57 kg/m  05/10/20 27.60 kg/m       Outpatient Encounter Medications as of 06/16/2020  Medication Sig  . Ascorbic Acid (VITAMIN C PO) Take by mouth.  . calcium carbonate (OSCAL) 1500 (600 Ca) MG TABS tablet Take by mouth daily with breakfast.  . Cholecalciferol (VITAMIN D3) 250 MCG (10000 UT) capsule Take 10,000 Units by mouth daily.  . cyclobenzaprine (FLEXERIL) 10 MG tablet 1 po TID  . dexlansoprazole (DEXILANT) 60 MG capsule Take 1 capsule (60 mg total) by mouth daily.  Marland Kitchen. EPINEPHrine 0.3 mg/0.3 mL IJ SOAJ injection Inject 0.3 mLs (0.3 mg total) into the muscle once.  Marland Kitchen. estradiol (ESTRACE) 1 MG tablet Take 1 tablet (1 mg total) by mouth daily.  . furosemide (LASIX) 20 MG tablet Take 1 tablet (20 mg total) by mouth daily.  Marland Kitchen. HYDROcodone-acetaminophen (NORCO) 10-325  MG tablet TAKE  (1)  TABLET  THREE TIMES DAILY.  Marland Kitchen HYDROcodone-acetaminophen (NORCO) 10-325 MG tablet Take 1 tablet by mouth 3 (three) times daily.  Marland Kitchen HYDROcodone-acetaminophen (NORCO) 10-325 MG tablet Take 1 tablet by mouth 3 (three) times daily.  Marland Kitchen loratadine (CLARITIN) 10 MG tablet Take 10 mg by mouth daily.  . Milnacipran (SAVELLA) 50 MG TABS tablet Take 1 tablet (50 mg total) by mouth 2 (two) times daily.  . Multiple Vitamin (MULTIVITAMIN) tablet Take 1 tablet by mouth daily.  . ondansetron (ZOFRAN ODT) 4 MG disintegrating tablet Take 1 tablet (4 mg total) by mouth every 8 (eight)  hours as needed for nausea or vomiting.  . pravastatin (PRAVACHOL) 40 MG tablet Take 1 tablet (40 mg total) by mouth daily.  . SUMAtriptan (IMITREX) 100 MG tablet TAKE 1 TABLET AT ONSET OF HEADACHE, MAY REPEAT ONCE IN 2HOURS   No facility-administered encounter medications on file as of 06/16/2020.    Past Surgical History:  Procedure Laterality Date  . ABDOMINAL HYSTERECTOMY    . ADNOIDS    . ANTERIOR CERVICAL DECOMP/DISCECTOMY FUSION N/A 10/27/2013   Procedure: ACDF/ANTERIOR CERVICAL DECOMPRESSION/DISCECTOMY FUSION  C5-C7  (2 LEVELS);  Surgeon: Melina Schools, MD;  Location: [place];  Service: Orthopedics;  Laterality: N/A;  . COLONOSCOPY  05/26/2020  . EYE SURGERY Bilateral    cataract removal  . KNEE ARTHROSCOPY Right   . UPPER GASTROINTESTINAL ENDOSCOPY  05/26/2020    Family History  Problem Relation Age of Onset  . Heart disease Mother   . Cancer Father        STOMACH CANCER  . Stomach cancer Father   . Cancer Maternal Grandfather   . Diabetes Paternal Grandmother   . Rectal cancer Neg Hx   . Esophageal cancer Neg Hx   . Colon cancer Neg Hx     New complaints: None today  Social history: Lives by herself  Controlled substance contract: 08/18/19    Review of Systems  Constitutional: Negative for diaphoresis.  Eyes: Negative for pain.  Respiratory: Negative for shortness of breath.   Cardiovascular: Negative for chest pain, palpitations and leg swelling.  Gastrointestinal: Negative for abdominal pain.  Endocrine: Negative for polydipsia.  Skin: Negative for rash.  Neurological: Negative for dizziness, weakness and headaches.  Hematological: Does not bruise/bleed easily.  All other systems reviewed and are negative.      Objective:   Physical Exam Vitals and nursing note reviewed.  Constitutional:      General: She is not in acute distress.    Appearance: Normal appearance. She is well-developed and well-nourished.  HENT:     Head: Normocephalic.     Nose:  Nose normal.     Mouth/Throat:     Mouth: Oropharynx is clear and moist.  Eyes:     Extraocular Movements: EOM normal.     Pupils: Pupils are equal, round, and reactive to light.  Neck:     Vascular: No carotid bruit or JVD.  Cardiovascular:     Rate and Rhythm: Normal rate and regular rhythm.     Pulses: Intact distal pulses.     Heart sounds: Normal heart sounds.  Pulmonary:     Effort: Pulmonary effort is normal. No respiratory distress.     Breath sounds: Normal breath sounds. No wheezing or rales.  Chest:     Chest wall: No tenderness.  Abdominal:     General: Bowel sounds are normal. There is no distension or abdominal bruit. Aorta is normal.  Palpations: Abdomen is soft. There is no hepatomegaly, splenomegaly, mass or pulsatile mass.     Tenderness: There is no abdominal tenderness.  Musculoskeletal:        General: No edema. Normal range of motion.     Cervical back: Normal range of motion and neck supple.  Lymphadenopathy:     Cervical: No cervical adenopathy.  Skin:    General: Skin is warm and dry.  Neurological:     Mental Status: She is alert and oriented to person, place, and time.     Deep Tendon Reflexes: Reflexes are normal and symmetric.  Psychiatric:        Mood and Affect: Mood and affect normal.        Behavior: Behavior normal.        Thought Content: Thought content normal.        Judgment: Judgment normal.     BP 131/87   Pulse 73   Temp (!) 97.2 F (36.2 C) (Temporal)   Resp 20   Ht 5\' 8"  (1.727 m)   Wt 182 lb (82.6 kg)   SpO2 98%   BMI 27.67 kg/m        Assessment & Plan:  BREALYN BARIL comes in today with chief complaint of Medical Management of Chronic Issues   Diagnosis and orders addressed:  1. Pure hypercholesterolemia Low fat diet - pravastatin (PRAVACHOL) 40 MG tablet; Take 1 tablet (40 mg total) by mouth daily.  Dispense: 90 tablet; Refill: 1  2. Migraine with aura and without status migrainosus, not intractable Use  imitrex as neded  3. Gastroesophageal reflux disease, unspecified whether esophagitis present Avoid spicy foods Do not eat 2 hours prior to bedtime - dexlansoprazole (DEXILANT) 60 MG capsule; Take 1 capsule (60 mg total) by mouth daily.  Dispense: 90 capsule; Refill: 1  4. GAD (generalized anxiety disorder) stress management  5. Primary insomnia Try trazadone and see if helps  6. Fibromyalgia Keep muscles warm - HYDROcodone-acetaminophen (NORCO) 10-325 MG tablet; Take 1 tablet by mouth 3 (three) times daily.  Dispense: 90 tablet; Refill: 0 - HYDROcodone-acetaminophen (NORCO) 10-325 MG tablet; TAKE  (1)  TABLET  THREE TIMES DAILY.  Dispense: 90 tablet; Refill: 0 - HYDROcodone-acetaminophen (NORCO) 10-325 MG tablet; Take 1 tablet by mouth 3 (three) times daily.  Dispense: 90 tablet; Refill: 0 - ketorolac (TORADOL) injection 60 mg  7. BMI 26.0-26.9,adult Discussed diet and exercise for person with BMI >25 Will recheck weight in 3-6 months  8. Menopausal symptoms - estradiol (ESTRACE) 1 MG tablet; Take 1 tablet (1 mg total) by mouth daily.  Dispense: 90 tablet; Refill: 1   Labs pending Health Maintenance reviewed Diet and exercise encouraged  Follow up plan: prn   Mary-Margaret Hassell Done, FNP

## 2020-06-17 LAB — CMP14+EGFR
ALT: 44 IU/L — ABNORMAL HIGH (ref 0–32)
AST: 48 IU/L — ABNORMAL HIGH (ref 0–40)
Albumin/Globulin Ratio: 1.6 (ref 1.2–2.2)
Albumin: 4.3 g/dL (ref 3.8–4.8)
Alkaline Phosphatase: 72 IU/L (ref 44–121)
BUN/Creatinine Ratio: 14 (ref 12–28)
BUN: 11 mg/dL (ref 8–27)
Bilirubin Total: <0.2 mg/dL (ref 0.0–1.2)
CO2: 26 mmol/L (ref 20–29)
Calcium: 9.8 mg/dL (ref 8.7–10.3)
Chloride: 100 mmol/L (ref 96–106)
Creatinine, Ser: 0.79 mg/dL (ref 0.57–1.00)
GFR calc Af Amer: 93 mL/min/{1.73_m2} (ref >59)
GFR calc non Af Amer: 81 mL/min/{1.73_m2} (ref >59)
Globulin, Total: 2.7 g/dL (ref 1.5–4.5)
Glucose: 101 mg/dL — ABNORMAL HIGH (ref 65–99)
Potassium: 3.9 mmol/L (ref 3.5–5.2)
Sodium: 141 mmol/L (ref 134–144)
Total Protein: 7 g/dL (ref 6.0–8.5)

## 2020-06-17 LAB — CBC WITH DIFFERENTIAL/PLATELET
Basophils Absolute: 0 10*3/uL (ref 0.0–0.2)
Basos: 0 %
EOS (ABSOLUTE): 0.1 10*3/uL (ref 0.0–0.4)
Eos: 2 %
Hematocrit: 45.8 % (ref 34.0–46.6)
Hemoglobin: 15.2 g/dL (ref 11.1–15.9)
Immature Grans (Abs): 0 10*3/uL (ref 0.0–0.1)
Immature Granulocytes: 0 %
Lymphocytes Absolute: 2.2 10*3/uL (ref 0.7–3.1)
Lymphs: 34 %
MCH: 29.1 pg (ref 26.6–33.0)
MCHC: 33.2 g/dL (ref 31.5–35.7)
MCV: 88 fL (ref 79–97)
Monocytes Absolute: 0.6 10*3/uL (ref 0.1–0.9)
Monocytes: 9 %
Neutrophils Absolute: 3.6 10*3/uL (ref 1.4–7.0)
Neutrophils: 55 %
Platelets: 206 10*3/uL (ref 150–450)
RBC: 5.22 x10E6/uL (ref 3.77–5.28)
RDW: 12.4 % (ref 11.7–15.4)
WBC: 6.5 10*3/uL (ref 3.4–10.8)

## 2020-06-17 LAB — LIPID PANEL
Chol/HDL Ratio: 4.7 ratio — ABNORMAL HIGH (ref 0.0–4.4)
Cholesterol, Total: 194 mg/dL (ref 100–199)
HDL: 41 mg/dL (ref >39)
LDL Chol Calc (NIH): 115 mg/dL — ABNORMAL HIGH (ref 0–99)
Triglycerides: 217 mg/dL — ABNORMAL HIGH (ref 0–149)
VLDL Cholesterol Cal: 38 mg/dL (ref 5–40)

## 2020-06-21 NOTE — Patient Instructions (Addendum)
DUE TO COVID-19 ONLY ONE VISITOR IS ALLOWED TO COME WITH YOU AND STAY IN THE WAITING ROOM ONLY DURING PRE OP AND PROCEDURE DAY OF SURGERY. THE 1 VISITOR  MAY VISIT WITH YOU AFTER SURGERY IN YOUR PRIVATE ROOM DURING VISITING HOURS ONLY!  YOU NEED TO HAVE A COVID 19 TEST ON__1/18_____ @_2 :40______, THIS TEST MUST BE DONE BEFORE SURGERY,  COVID TESTING SITE [place] [address], IT IS ON THE RIGHT GOING OUT [address] APPROXIMATELY  2 MINUTES PAST [employer] ON THE RIGHT. ONCE YOUR COVID TEST IS COMPLETED,  PLEASE BEGIN THE QUARANTINE INSTRUCTIONS AS OUTLINED IN YOUR HANDOUT.                [name]    Your procedure is scheduled on: 06/30/20   Report to [facility] Main  Entrance   Report to to short stay  5:30 AM     Call this number if you have problems the morning of surgery [name], NO [place].   No food after midnight.    You may have clear liquid until 4:30 AM.   . Nothing by mouth after 4:30 AM.   Take these medicines the morning of surgery with A SIP OF WATER: Dexilant Esterace                                 You may not have any metal on your body including hair pins and              piercings  Do not wear jewelry, make-up, lotions, powders or perfumes, deodorant             Do not wear nail polish on your fingernails.  Do not shave  48 hours prior to surgery.           Do not bring valuables to the hospital. [place].  Contacts, dentures or bridgework may not be worn into surgery.       Patients discharged the day of surgery will not be allowed to drive home.  IF YOU ARE HAVING SURGERY AND GOING HOME THE SAME DAY, YOU MUST HAVE AN ADULT TO DRIVE YOU HOME AND BE WITH YOU FOR 24 HOURS. YOU MAY GO HOME BY TAXI OR UBER OR ORTHERWISE, BUT AN ADULT MUST ACCOMPANY YOU HOME AND STAY WITH  YOU FOR 24 HOURS.  Name and phone number of your driver:  Special Instructions: N/A              Please read over the following fact sheets you were given: _____________________________________________________________________             [facility] - Preparing for Surgery Before surgery, you can play an important role.  Because skin is not sterile, your skin needs to be as free of germs as possible.  You can reduce the number of germs on your skin by washing with CHG (chlorahexidine gluconate) soap before surgery.  CHG is an antiseptic cleaner which kills germs and bonds with the skin to continue killing germs even after washing. Please DO NOT use if you have an allergy to CHG or antibacterial soaps.  If your skin becomes reddened/irritated stop  using the CHG and inform your nurse when you arrive at Short Stay. Do not shave (including legs and underarms) for at least 48 hours prior to the first CHG shower.  You may shave your face/neck. Please follow these instructions carefully:  1.  Shower with CHG Soap the night before surgery and the  morning of Surgery.  2.  If you choose to wash your hair, wash your hair first as usual with your  normal  shampoo.  3.  After you shampoo, rinse your hair and body thoroughly to remove the  shampoo.                                        4.  Use CHG as you would any other liquid soap.  You can apply chg directly  to the skin and wash                       Gently with a scrungie or clean washcloth.  5.  Apply the CHG Soap to your body ONLY FROM THE NECK DOWN.   Do not use on face/ open                           Wound or open sores. Avoid contact with eyes, ears mouth and genitals (private parts).                       Wash face,  Genitals (private parts) with your normal soap.             6.  Wash thoroughly, paying special attention to the area where your surgery  will be performed.  7.  Thoroughly rinse your body with warm water from the neck down.  8.   DO NOT shower/wash with your normal soap after using and rinsing off  the CHG Soap.                9.  Pat yourself dry with a clean towel.            10.  Wear clean pajamas.            11.  Place clean sheets on your bed the night of your first shower and do not  sleep with pets. Day of Surgery : Do not apply any lotions/deodorants the morning of surgery.  Please wear clean clothes to the hospital/surgery center.  FAILURE TO FOLLOW THESE INSTRUCTIONS MAY RESULT IN THE CANCELLATION OF YOUR SURGERY PATIENT SIGNATURE_________________________________  NURSE SIGNATURE__________________________________  ________________________________________________________________________   [name]  An incentive spirometer is a tool that can help keep your lungs clear and active. This tool measures how well you are filling your lungs with each breath. Taking long deep breaths may help reverse or decrease the chance of developing breathing (pulmonary) problems (especially infection) following:  A long period of time when you are unable to move or be active. BEFORE THE PROCEDURE   If the spirometer includes an indicator to show your best effort, your nurse or respiratory therapist will set it to a desired goal.  If possible, sit up straight or lean slightly forward. Try not to slouch.  Hold the incentive spirometer in an upright position. INSTRUCTIONS FOR USE  1. Sit on the edge of your bed if possible, or sit up as far as  you can in bed or on a chair. 2. Hold the incentive spirometer in an upright position. 3. Breathe out normally. 4. Place the mouthpiece in your mouth and seal your lips tightly around it. 5. Breathe in slowly and as deeply as possible, raising the piston or the ball toward the top of the column. 6. Hold your breath for 3-5 seconds or for as long as possible. Allow the piston or ball to fall to the bottom of the column. 7. Remove the mouthpiece from your mouth and breathe  out normally. 8. Rest for a few seconds and repeat Steps 1 through 7 at least 10 times every 1-2 hours when you are awake. Take your time and take a few normal breaths between deep breaths. 9. The spirometer may include an indicator to show your best effort. Use the indicator as a goal to work toward during each repetition. 10. After each set of 10 deep breaths, practice coughing to be sure your lungs are clear. If you have an incision (the cut made at the time of surgery), support your incision when coughing by placing a pillow or rolled up towels firmly against it. Once you are able to get out of bed, walk around indoors and cough well. You may stop using the incentive spirometer when instructed by your caregiver.  RISKS AND COMPLICATIONS  Take your time so you do not get dizzy or light-headed.  If you are in pain, you may need to take or ask for pain medication before doing incentive spirometry. It is harder to take a deep breath if you are having pain. AFTER USE  Rest and breathe slowly and easily.  It can be helpful to keep track of a log of your progress. Your caregiver can provide you with a simple table to help with this. If you are using the spirometer at home, follow these instructions: [place] IF:   You are having difficultly using the spirometer.  You have trouble using the spirometer as often as instructed.  Your pain medication is not giving enough relief while using the spirometer.  You develop fever of 100.5 F (38.1 C) or higher. SEEK IMMEDIATE MEDICAL CARE IF:   You cough up bloody sputum that had not been present before.  You develop fever of 102 F (38.9 C) or greater.  You develop worsening pain at or near the incision site. MAKE SURE YOU:   Understand these instructions.  Will watch your condition.  Will get help right away if you are not doing well or get worse. Document Released: 10/07/2006 Document Revised: 08/19/2011 Document Reviewed:  12/08/2006 [facility] Patient Information 2014 [place], [place].   ________________________________________________________________________

## 2020-06-21 NOTE — H&P (Signed)
Patient's anticipated LOS is less than 2 midnights, meeting these requirements: - Younger than 70 - Lives within 1 hour of care - Has a competent adult at home to recover with post-op recover - NO history of  - Chronic pain requiring opiods  - Diabetes  - Coronary Artery Disease  - Heart failure  - Heart attack  - Stroke  - DVT/VTE  - Cardiac arrhythmia  - Respiratory Failure/COPD  - Renal failure  - Anemia  - Advanced Liver disease       [name] is an 60 y.o. female.    Chief Complaint: right knee pain  HPI: Pt is a 60 y.o. female complaining of right knee pain for multiple years. Pain had continually increased since the beginning. X-rays in the clinic show end-stage arthritic changes of the right knee. Pt has tried various conservative treatments which have failed to alleviate their symptoms, including injections and therapy. Various options are discussed with the patient. Risks, benefits and expectations were discussed with the patient. Patient understand the risks, benefits and expectations and wishes to proceed with surgery.   PCP:  Chevis Pretty, FNP  D/C Plans: Home  PMH: Past Medical History:  Diagnosis Date  . Arthritis    right hand  . Dyslipidemia   . Family history of anesthesia complication    patients mother has severe nausea and vomiting after  . Fibromyalgia   . GERD (gastroesophageal reflux disease)   . Guillain-Barre syndrome ([place])   . Hiatal hernia   . Kidney stones   . Migraines   . PONV (postoperative nausea and vomiting)   . TMJ (dislocation of temporomandibular joint)   . UTI (urinary tract infection)    hx of  . Vitamin D deficiency     PSH: Past Surgical History:  Procedure Laterality Date  . ABDOMINAL HYSTERECTOMY    . ADNOIDS    . ANTERIOR CERVICAL DECOMP/DISCECTOMY FUSION N/A 10/27/2013   Procedure: ACDF/ANTERIOR CERVICAL DECOMPRESSION/DISCECTOMY FUSION  C5-C7  (2 LEVELS);  Surgeon: Melina Schools, MD;  Location: [place];  Service: Orthopedics;  Laterality: N/A;  . COLONOSCOPY  05/26/2020  . EYE SURGERY Bilateral    cataract removal  . KNEE ARTHROSCOPY Right   . UPPER GASTROINTESTINAL ENDOSCOPY  05/26/2020    Social History:  reports that she has quit smoking. She has a 15.00 pack-year smoking history. She has never used smokeless tobacco. She reports current alcohol use of about 1.0 standard drink of alcohol per week. She reports that she does not use drugs.  Allergies:  Allergies  Allergen Reactions  . Other Anaphylaxis    Plastic bottles; patient drank from a water bottle and had to use Epi pen  . Peanuts [Peanut Oil] Anaphylaxis  . Statins Other (See Comments)    Severe joint pain  . Tape Other (See Comments)    Causes red blisters on skin. Can use paper tape    Medications: No current facility-administered medications for this encounter.   Current Outpatient Medications  Medication Sig Dispense Refill  . calcium carbonate (OSCAL) 1500 (600 Ca) MG TABS tablet Take 600 mg of elemental calcium by mouth daily with breakfast.    . Cholecalciferol (VITAMIN D3) 250 MCG (10000 UT) capsule Take 10,000 Units by mouth daily.    . cyclobenzaprine (FLEXERIL) 10 MG tablet 1 po TID (Patient taking differently: Take 10 mg by mouth 3 (three) times daily. 1 po TID) 30 tablet 1  . dexlansoprazole (DEXILANT) 60 MG capsule Take 1 capsule (  60 mg total) by mouth daily. 90 capsule 1  . EPINEPHrine 0.3 mg/0.3 mL IJ SOAJ injection Inject 0.3 mLs (0.3 mg total) into the muscle once. 2 Device 1  . estradiol (ESTRACE) 1 MG tablet Take 1 tablet (1 mg total) by mouth daily. 90 tablet 1  . furosemide (LASIX) 20 MG tablet Take 1 tablet (20 mg total) by mouth daily. 90 tablet 1  . [START ON 08/14/2020] HYDROcodone-acetaminophen (NORCO) 10-325 MG tablet Take 1 tablet by mouth 3 (three) times daily. 90 tablet 0  . [START ON 07/16/2020] HYDROcodone-acetaminophen (NORCO) 10-325 MG tablet TAKE  (1)  TABLET  THREE TIMES DAILY. 90  tablet 0  . HYDROcodone-acetaminophen (NORCO) 10-325 MG tablet Take 1 tablet by mouth 3 (three) times daily. 90 tablet 0  . loratadine (CLARITIN) 10 MG tablet Take 10 mg by mouth daily.    . Multiple Vitamin (MULTIVITAMIN) tablet Take 1 tablet by mouth daily.    . ondansetron (ZOFRAN ODT) 4 MG disintegrating tablet Take 1 tablet (4 mg total) by mouth every 8 (eight) hours as needed for nausea or vomiting. 30 tablet 1  . pravastatin (PRAVACHOL) 40 MG tablet Take 1 tablet (40 mg total) by mouth daily. 90 tablet 1  . SUMAtriptan (IMITREX) 100 MG tablet TAKE 1 TABLET AT ONSET OF HEADACHE, MAY REPEAT ONCE IN 2HOURS 9 tablet 2  . traZODone (DESYREL) 50 MG tablet Take 0.5-1 tablets (25-50 mg total) by mouth at bedtime as needed for sleep. 30 tablet 3    No results found for this or any previous visit (from the past 48 hour(s)). No results found.  ROS: Pain with rom of the right lower extremity  Physical Exam: Alert and oriented 60 y.o. female in no acute distress Cranial nerves 2-12 intact Cervical spine: full rom with no tenderness, nv intact distally Chest: active breath sounds bilaterally, no wheeze rhonchi or rales Heart: regular rate and rhythm, no murmur Abd: non tender non distended with active bowel sounds Hip is stable with rom  Right knee medial and lateral joint line tenderness nv intact distally No rashes or edema distally Antalgic gait  Assessment/Plan Assessment: right knee end stage osteoarthritis   Plan:  Patient will undergo a right total knee replacement by Dr. Veverly Fells at [facility] Risks benefits and expectations were discussed with the patient. Patient understand risks, benefits and expectations and wishes to proceed. Preoperative templating of the joint replacement has been completed, documented, and submitted to the Operating Room personnel in order to optimize intra-operative equipment management.   Merla Riches PA-C, MPAS [facility] is now [employer] [address], [place], [place], [place] [address] Phone: [phone] [email or web address] Facebook  [employer]

## 2020-06-23 ENCOUNTER — Encounter (HOSPITAL_COMMUNITY)
Admission: RE | Admit: 2020-06-23 | Discharge: 2020-06-23 | Disposition: A | Discharge status: Home / Self Care | Payer: 59 | Source: Ambulatory Visit | Attending: Orthopedic Surgery | Admitting: Orthopedic Surgery

## 2020-06-23 ENCOUNTER — Other Ambulatory Visit: Payer: Self-pay

## 2020-06-23 ENCOUNTER — Encounter (HOSPITAL_COMMUNITY): Payer: Self-pay

## 2020-06-23 DIAGNOSIS — Z01812 Encounter for preprocedural laboratory examination: Secondary | ICD-10-CM | POA: Diagnosis not present

## 2020-06-23 HISTORY — DX: Personal history of urinary calculi: Z87.442

## 2020-06-23 LAB — SURGICAL PCR SCREEN
MRSA, PCR: NEGATIVE
Staphylococcus aureus: NEGATIVE

## 2020-06-23 NOTE — Progress Notes (Signed)
COVID Vaccine Completed:No Date COVID Vaccine completed: COVID vaccine manufacturer: [employer]   PCP - [facility] FNP Cardiologist - no  Chest x-ray - no EKG -08/12/19-epic  Stress Test - no ECHO - no Cardiac Cath - no Pacemaker/ICD device last checked:NA  Sleep Study - no CPAP -   Fasting Blood Sugar - NA Checks Blood Sugar _____ times a day  Blood Thinner Instructions:NA Aspirin Instructions: Last Dose:  Anesthesia review:   Patient denies shortness of breath, fever, cough and chest pain at PAT appointment yes  Patient verbalized understanding of instructions that were given to them at the PAT appointment. Patient was also instructed that they will need to review over the PAT instructions again at home before surgery. No SOB with any activities. She has TMJ but has not had any problems with anesthesia in the past

## 2020-06-27 ENCOUNTER — Other Ambulatory Visit (HOSPITAL_COMMUNITY)
Admission: RE | Admit: 2020-06-27 | Discharge: 2020-06-27 | Disposition: A | Discharge status: Home / Self Care | Payer: 59 | Source: Ambulatory Visit | Attending: Orthopedic Surgery | Admitting: Orthopedic Surgery

## 2020-06-27 DIAGNOSIS — Z20822 Contact with and (suspected) exposure to covid-19: Secondary | ICD-10-CM | POA: Diagnosis not present

## 2020-06-27 DIAGNOSIS — Z01812 Encounter for preprocedural laboratory examination: Secondary | ICD-10-CM | POA: Insufficient documentation

## 2020-06-28 LAB — SARS CORONAVIRUS 2 (TAT 6-24 HRS): SARS Coronavirus 2: NEGATIVE

## 2020-06-29 MED ORDER — BUPIVACAINE LIPOSOME 1.3 % IJ SUSP
20.0000 mL | Freq: Once | INTRAMUSCULAR | Status: DC
  Filled 2020-06-29: qty 20

## 2020-06-29 NOTE — Anesthesia Preprocedure Evaluation (Addendum)
Anesthesia Evaluation  Patient identified by MRN, date of birth, ID band Patient awake    Reviewed: Allergy & Precautions, H&P , NPO status , Patient's Chart, lab work & pertinent test results  History of Anesthesia Complications (+) PONV and Family history of anesthesia reaction  Airway Mallampati: III  TM Distance: >3 FB Neck ROM: Full    Dental no notable dental hx. (+) Teeth Intact, Dental Advisory Given   Pulmonary neg pulmonary ROS, former smoker,    Pulmonary exam normal breath sounds clear to auscultation       Cardiovascular Exercise Tolerance: Good negative cardio ROS   Rhythm:Regular Rate:Normal     Neuro/Psych  Headaches, Anxiety    GI/Hepatic Neg liver ROS, hiatal hernia, GERD  Medicated,  Endo/Other  negative endocrine ROS  Renal/GU negative Renal ROS  negative genitourinary   Musculoskeletal  (+) Arthritis , Osteoarthritis,  Fibromyalgia -  Abdominal   Peds  Hematology negative hematology ROS (+)   Anesthesia Other Findings   Reproductive/Obstetrics negative OB ROS                            Anesthesia Physical Anesthesia Plan  ASA: II  Anesthesia Plan: Spinal   Post-op Pain Management:  Regional for Post-op pain   Induction: Intravenous  PONV Risk Score and Plan: 4 or greater and Ondansetron, Dexamethasone, Propofol infusion and Midazolam  Airway Management Planned: Natural Airway and Simple Face Mask  Additional Equipment:   Intra-op Plan:   Post-operative Plan:   Informed Consent: I have reviewed the patients History and Physical, chart, labs and discussed the procedure including the risks, benefits and alternatives for the proposed anesthesia with the patient or authorized representative who has indicated his/her understanding and acceptance.     Dental advisory given  Plan Discussed with: CRNA  Anesthesia Plan Comments:        Anesthesia  Quick Evaluation

## 2020-06-30 ENCOUNTER — Encounter (HOSPITAL_COMMUNITY)
Admission: RE | Disposition: A | Discharge status: Home / Self Care | Payer: Self-pay | Source: Home / Self Care | Attending: Orthopedic Surgery

## 2020-06-30 ENCOUNTER — Ambulatory Visit (HOSPITAL_COMMUNITY)
Admission: RE | Admit: 2020-06-30 | Discharge: 2020-06-30 | Disposition: A | Discharge status: Home / Self Care | Payer: 59 | Attending: Orthopedic Surgery | Admitting: Orthopedic Surgery

## 2020-06-30 ENCOUNTER — Encounter (HOSPITAL_COMMUNITY): Payer: Self-pay | Admitting: Orthopedic Surgery

## 2020-06-30 ENCOUNTER — Ambulatory Visit (HOSPITAL_COMMUNITY): Payer: 59 | Admitting: Anesthesiology

## 2020-06-30 DIAGNOSIS — Z87892 Personal history of anaphylaxis: Secondary | ICD-10-CM | POA: Insufficient documentation

## 2020-06-30 DIAGNOSIS — Z888 Allergy status to other drugs, medicaments and biological substances status: Secondary | ICD-10-CM | POA: Insufficient documentation

## 2020-06-30 DIAGNOSIS — M21061 Valgus deformity, not elsewhere classified, right knee: Secondary | ICD-10-CM | POA: Insufficient documentation

## 2020-06-30 DIAGNOSIS — Z79899 Other long term (current) drug therapy: Secondary | ICD-10-CM | POA: Diagnosis not present

## 2020-06-30 DIAGNOSIS — Z91048 Other nonmedicinal substance allergy status: Secondary | ICD-10-CM | POA: Diagnosis not present

## 2020-06-30 DIAGNOSIS — M1711 Unilateral primary osteoarthritis, right knee: Secondary | ICD-10-CM | POA: Insufficient documentation

## 2020-06-30 DIAGNOSIS — Z91010 Allergy to peanuts: Secondary | ICD-10-CM | POA: Insufficient documentation

## 2020-06-30 DIAGNOSIS — Z79890 Hormone replacement therapy (postmenopausal): Secondary | ICD-10-CM | POA: Insufficient documentation

## 2020-06-30 DIAGNOSIS — Z87891 Personal history of nicotine dependence: Secondary | ICD-10-CM | POA: Insufficient documentation

## 2020-06-30 HISTORY — PX: TOTAL KNEE ARTHROPLASTY: SHX125

## 2020-06-30 SURGERY — ARTHROPLASTY, KNEE, TOTAL
Anesthesia: Spinal | Site: Knee | Laterality: Right

## 2020-06-30 MED ORDER — FENTANYL CITRATE (PF) 100 MCG/2ML IJ SOLN
INTRAMUSCULAR | Status: DC | PRN
  Administered 2020-06-30 (×2): 50 ug via INTRAVENOUS

## 2020-06-30 MED ORDER — BUPIVACAINE-EPINEPHRINE (PF) 0.5% -1:200000 IJ SOLN
INTRAMUSCULAR | Status: DC | PRN
  Administered 2020-06-30: 20 mL via PERINEURAL

## 2020-06-30 MED ORDER — PHENYLEPHRINE 40 MCG/ML (10ML) SYRINGE FOR IV PUSH (FOR BLOOD PRESSURE SUPPORT)
PREFILLED_SYRINGE | INTRAVENOUS | Status: AC
  Filled 2020-06-30: qty 10

## 2020-06-30 MED ORDER — TRANEXAMIC ACID-NACL 1000-0.7 MG/100ML-% IV SOLN
1000.0000 mg | Freq: Once | INTRAVENOUS | Status: AC
  Administered 2020-06-30: 1000 mg via INTRAVENOUS

## 2020-06-30 MED ORDER — DEXAMETHASONE SODIUM PHOSPHATE 10 MG/ML IJ SOLN
INTRAMUSCULAR | Status: DC | PRN
  Administered 2020-06-30: 8 mg via INTRAVENOUS

## 2020-06-30 MED ORDER — BUPIVACAINE-EPINEPHRINE 0.25% -1:200000 IJ SOLN
INTRAMUSCULAR | Status: DC | PRN
  Administered 2020-06-30: 30 mL

## 2020-06-30 MED ORDER — BUPIVACAINE LIPOSOME 1.3 % IJ SUSP
INTRAMUSCULAR | Status: DC | PRN
  Administered 2020-06-30: 20 mL

## 2020-06-30 MED ORDER — POVIDONE-IODINE 10 % EX SWAB
2.0000 "application " | Freq: Once | CUTANEOUS | Status: AC
  Administered 2020-06-30: 2 via TOPICAL

## 2020-06-30 MED ORDER — MIDAZOLAM HCL 2 MG/2ML IJ SOLN
INTRAMUSCULAR | Status: DC | PRN
  Administered 2020-06-30: 2 mg via INTRAVENOUS

## 2020-06-30 MED ORDER — PHENYLEPHRINE HCL (PRESSORS) 10 MG/ML IV SOLN
INTRAVENOUS | Status: AC
  Filled 2020-06-30: qty 1

## 2020-06-30 MED ORDER — ONDANSETRON HCL 4 MG/2ML IJ SOLN
INTRAMUSCULAR | Status: DC | PRN
  Administered 2020-06-30: 4 mg via INTRAVENOUS

## 2020-06-30 MED ORDER — BUPIVACAINE-EPINEPHRINE (PF) 0.25% -1:200000 IJ SOLN
INTRAMUSCULAR | Status: AC
  Filled 2020-06-30: qty 30

## 2020-06-30 MED ORDER — LACTATED RINGERS IV SOLN
INTRAVENOUS | Status: DC
  Administered 2020-06-30: 1000 mL via INTRAVENOUS

## 2020-06-30 MED ORDER — METHOCARBAMOL 500 MG IVPB - SIMPLE MED
500.0000 mg | Freq: Four times a day (QID) | INTRAVENOUS | Status: DC | PRN
  Administered 2020-06-30: 500 mg via INTRAVENOUS

## 2020-06-30 MED ORDER — METHOCARBAMOL 500 MG PO TABS: 500.0000 mg | ORAL_TABLET | Freq: Four times a day (QID) | ORAL | Status: DC | PRN

## 2020-06-30 MED ORDER — DEXAMETHASONE SODIUM PHOSPHATE 10 MG/ML IJ SOLN
INTRAMUSCULAR | Status: AC
  Filled 2020-06-30: qty 1

## 2020-06-30 MED ORDER — 0.9 % SODIUM CHLORIDE (POUR BTL) OPTIME
TOPICAL | Status: DC | PRN
  Administered 2020-06-30: 1000 mL

## 2020-06-30 MED ORDER — PROPOFOL 1000 MG/100ML IV EMUL
INTRAVENOUS | Status: AC
  Filled 2020-06-30: qty 100

## 2020-06-30 MED ORDER — CELECOXIB 200 MG PO CAPS
200.0000 mg | ORAL_CAPSULE | Freq: Once | ORAL | Status: AC
  Administered 2020-06-30: 200 mg via ORAL
  Filled 2020-06-30: qty 1

## 2020-06-30 MED ORDER — METHOCARBAMOL 500 MG IVPB - SIMPLE MED
INTRAVENOUS | Status: AC
  Filled 2020-06-30: qty 50

## 2020-06-30 MED ORDER — MIDAZOLAM HCL 2 MG/2ML IJ SOLN
INTRAMUSCULAR | Status: AC
  Filled 2020-06-30: qty 2

## 2020-06-30 MED ORDER — PROPOFOL 500 MG/50ML IV EMUL
INTRAVENOUS | Status: AC
  Filled 2020-06-30: qty 50

## 2020-06-30 MED ORDER — PHENYLEPHRINE HCL-NACL 10-0.9 MG/250ML-% IV SOLN
INTRAVENOUS | Status: DC | PRN
  Administered 2020-06-30: 30 ug/min via INTRAVENOUS

## 2020-06-30 MED ORDER — CEFAZOLIN SODIUM-DEXTROSE 2-4 GM/100ML-% IV SOLN
2.0000 g | INTRAVENOUS | Status: AC
  Administered 2020-06-30: 2 g via INTRAVENOUS
  Filled 2020-06-30: qty 100

## 2020-06-30 MED ORDER — CEFAZOLIN SODIUM-DEXTROSE 2-4 GM/100ML-% IV SOLN
2.0000 g | Freq: Four times a day (QID) | INTRAVENOUS | Status: DC
  Administered 2020-06-30: 2 g via INTRAVENOUS

## 2020-06-30 MED ORDER — ONDANSETRON HCL 4 MG/2ML IJ SOLN
INTRAMUSCULAR | Status: AC
  Filled 2020-06-30: qty 2

## 2020-06-30 MED ORDER — SODIUM CHLORIDE 0.9 % IR SOLN
Status: DC | PRN
  Administered 2020-06-30: 1000 mL

## 2020-06-30 MED ORDER — OXYCODONE HCL 5 MG PO TABS
ORAL_TABLET | ORAL | Status: AC
  Filled 2020-06-30: qty 2

## 2020-06-30 MED ORDER — LACTATED RINGERS IV BOLUS
250.0000 mL | Freq: Once | INTRAVENOUS | Status: AC
  Administered 2020-06-30: 250 mL via INTRAVENOUS

## 2020-06-30 MED ORDER — SODIUM CHLORIDE (PF) 0.9 % IJ SOLN
INTRAMUSCULAR | Status: DC | PRN
  Administered 2020-06-30: 30 mL

## 2020-06-30 MED ORDER — FENTANYL CITRATE (PF) 100 MCG/2ML IJ SOLN
INTRAMUSCULAR | Status: AC
  Filled 2020-06-30: qty 2

## 2020-06-30 MED ORDER — ORAL CARE MOUTH RINSE: 15.0000 mL | Freq: Once | OROMUCOSAL | Status: AC

## 2020-06-30 MED ORDER — TRANEXAMIC ACID-NACL 1000-0.7 MG/100ML-% IV SOLN
INTRAVENOUS | Status: AC
  Filled 2020-06-30: qty 100

## 2020-06-30 MED ORDER — ACETAMINOPHEN 500 MG PO TABS
1000.0000 mg | ORAL_TABLET | Freq: Once | ORAL | Status: AC
  Administered 2020-06-30: 1000 mg via ORAL
  Filled 2020-06-30: qty 2

## 2020-06-30 MED ORDER — CEFAZOLIN SODIUM-DEXTROSE 2-4 GM/100ML-% IV SOLN
INTRAVENOUS | Status: AC
  Filled 2020-06-30: qty 100

## 2020-06-30 MED ORDER — TRANEXAMIC ACID-NACL 1000-0.7 MG/100ML-% IV SOLN
1000.0000 mg | INTRAVENOUS | Status: AC
  Administered 2020-06-30: 1000 mg via INTRAVENOUS
  Filled 2020-06-30: qty 100

## 2020-06-30 MED ORDER — HYDROMORPHONE HCL 1 MG/ML IJ SOLN: 0.2500 mg | INTRAMUSCULAR | Status: DC | PRN

## 2020-06-30 MED ORDER — WATER FOR IRRIGATION, STERILE IR SOLN
Status: DC | PRN
  Administered 2020-06-30: 2000 mL

## 2020-06-30 MED ORDER — PROPOFOL 10 MG/ML IV BOLUS
INTRAVENOUS | Status: AC
  Filled 2020-06-30: qty 20

## 2020-06-30 MED ORDER — HYDROMORPHONE HCL 1 MG/ML IJ SOLN
INTRAMUSCULAR | Status: AC
  Filled 2020-06-30: qty 1

## 2020-06-30 MED ORDER — OXYCODONE HCL 5 MG PO TABS
5.0000 mg | ORAL_TABLET | ORAL | Status: DC | PRN
  Administered 2020-06-30 (×2): 10 mg via ORAL

## 2020-06-30 MED ORDER — PROPOFOL 500 MG/50ML IV EMUL
INTRAVENOUS | Status: DC | PRN
  Administered 2020-06-30: 125 ug/kg/min via INTRAVENOUS

## 2020-06-30 MED ORDER — HYDROMORPHONE HCL 1 MG/ML IJ SOLN
0.5000 mg | INTRAMUSCULAR | Status: DC | PRN
  Administered 2020-06-30: 0.5 mg via INTRAVENOUS

## 2020-06-30 MED ORDER — PHENYLEPHRINE 40 MCG/ML (10ML) SYRINGE FOR IV PUSH (FOR BLOOD PRESSURE SUPPORT)
PREFILLED_SYRINGE | INTRAVENOUS | Status: DC | PRN
  Administered 2020-06-30: 120 ug via INTRAVENOUS

## 2020-06-30 MED ORDER — BUPIVACAINE IN DEXTROSE 0.75-8.25 % IT SOLN
INTRATHECAL | Status: DC | PRN
  Administered 2020-06-30: 1.8 mL via INTRATHECAL

## 2020-06-30 MED ORDER — CHLORHEXIDINE GLUCONATE 0.12 % MT SOLN
15.0000 mL | Freq: Once | OROMUCOSAL | Status: AC
  Administered 2020-06-30: 15 mL via OROMUCOSAL

## 2020-06-30 MED ORDER — LACTATED RINGERS IV BOLUS
500.0000 mL | Freq: Once | INTRAVENOUS | Status: AC
  Administered 2020-06-30: 500 mL via INTRAVENOUS

## 2020-06-30 MED ORDER — SODIUM CHLORIDE (PF) 0.9 % IJ SOLN
INTRAMUSCULAR | Status: AC
  Filled 2020-06-30: qty 30

## 2020-06-30 MED ORDER — PROPOFOL 10 MG/ML IV BOLUS
INTRAVENOUS | Status: DC | PRN
  Administered 2020-06-30 (×2): 20 mg via INTRAVENOUS

## 2020-06-30 SURGICAL SUPPLY — 54 items
ATTUNE PS FEM RT SZ 4 CEM KNEE (Femur) ×1 IMPLANT
ATTUNE PSRP INSR SZ4 5 KNEE (Insert) ×1 IMPLANT
BAG SPEC THK2 15X12 ZIP CLS (MISCELLANEOUS)
BAG ZIPLOCK 12X15 (MISCELLANEOUS) IMPLANT
BASEPLATE TIBIAL ROTATING SZ 4 (Knees) ×1 IMPLANT
BLADE SAG 18X100X1.27 (BLADE) ×2 IMPLANT
BLADE SAW SGTL 13X75X1.27 (BLADE) ×2 IMPLANT
BNDG CMPR MED 10X6 ELC LF (GAUZE/BANDAGES/DRESSINGS) ×1
BNDG ELASTIC 6X10 VLCR STRL LF (GAUZE/BANDAGES/DRESSINGS) ×2 IMPLANT
BNDG GAUZE ELAST 4 BULKY (GAUZE/BANDAGES/DRESSINGS) ×2 IMPLANT
BOWL SMART MIX CTS (DISPOSABLE) ×2 IMPLANT
BSPLAT TIB 4 CMNT ROT PLAT STR (Knees) ×1 IMPLANT
CEMENT HV SMART SET (Cement) ×4 IMPLANT
COVER SURGICAL LIGHT HANDLE (MISCELLANEOUS) ×2 IMPLANT
COVER WAND RF STERILE (DRAPES) IMPLANT
CUFF TOURN SGL QUICK 34 (TOURNIQUET CUFF) ×2
CUFF TRNQT CYL 34X4.125X (TOURNIQUET CUFF) ×1 IMPLANT
DRAPE SHEET LG 3/4 BI-LAMINATE (DRAPES) ×2 IMPLANT
DRAPE U-SHAPE 47X51 STRL (DRAPES) ×2 IMPLANT
DRSG ADAPTIC 3X8 NADH LF (GAUZE/BANDAGES/DRESSINGS) ×2 IMPLANT
DRSG PAD ABDOMINAL 8X10 ST (GAUZE/BANDAGES/DRESSINGS) ×2 IMPLANT
DURAPREP 26ML APPLICATOR (WOUND CARE) ×2 IMPLANT
ELECT REM PT RETURN 15FT ADLT (MISCELLANEOUS) ×2 IMPLANT
GAUZE SPONGE 4X4 12PLY STRL (GAUZE/BANDAGES/DRESSINGS) ×2 IMPLANT
GLOVE BIOGEL PI ORTHO PRO 7.5 (GLOVE) ×1
GLOVE BIOGEL PI ORTHO PRO SZ8 (GLOVE) ×1
GLOVE ORTHO TXT STRL SZ7.5 (GLOVE) ×2 IMPLANT
GLOVE PI ORTHO PRO STRL 7.5 (GLOVE) ×1 IMPLANT
GLOVE PI ORTHO PRO STRL SZ8 (GLOVE) ×1 IMPLANT
GLOVE SURG ORTHO 8.5 STRL (GLOVE) ×4 IMPLANT
GOWN STRL REUS W/TWL XL LVL3 (GOWN DISPOSABLE) ×4 IMPLANT
HANDPIECE INTERPULSE COAX TIP (DISPOSABLE) ×2
HOLDER FOLEY CATH W/STRAP (MISCELLANEOUS) IMPLANT
IMMOBILIZER KNEE 20 (SOFTGOODS) ×2
IMMOBILIZER KNEE 20 THIGH 36 (SOFTGOODS) IMPLANT
KIT TURNOVER KIT A (KITS) IMPLANT
MANIFOLD NEPTUNE II (INSTRUMENTS) ×2 IMPLANT
NS IRRIG 1000ML POUR BTL (IV SOLUTION) ×2 IMPLANT
PACK TOTAL KNEE CUSTOM (KITS) ×2 IMPLANT
PATELLA MEDIAL ATTUN 35MM KNEE (Knees) ×1 IMPLANT
PENCIL SMOKE EVACUATOR (MISCELLANEOUS) IMPLANT
PIN DRILL FIX HALF THREAD (BIT) ×1 IMPLANT
PIN STEINMAN FIXATION KNEE (PIN) ×1 IMPLANT
PROTECTOR NERVE ULNAR (MISCELLANEOUS) ×2 IMPLANT
SET HNDPC FAN SPRY TIP SCT (DISPOSABLE) ×1 IMPLANT
STAPLER VISISTAT 35W (STAPLE) IMPLANT
STRIP CLOSURE SKIN 1/2X4 (GAUZE/BANDAGES/DRESSINGS) ×3 IMPLANT
SUT MNCRL AB 3-0 PS2 18 (SUTURE) ×2 IMPLANT
SUT VIC AB 0 CT1 36 (SUTURE) ×2 IMPLANT
SUT VIC AB 1 CT1 36 (SUTURE) ×6 IMPLANT
SUT VIC AB 2-0 CT1 27 (SUTURE) ×6
SUT VIC AB 2-0 CT1 TAPERPNT 27 (SUTURE) ×1 IMPLANT
TRAY FOLEY MTR SLVR 16FR STAT (SET/KITS/TRAYS/PACK) ×2 IMPLANT
WATER STERILE IRR 1000ML POUR (IV SOLUTION) ×4 IMPLANT

## 2020-06-30 NOTE — Anesthesia Postprocedure Evaluation (Signed)
Anesthesia Post Note  Patient: [name]  Procedure(s) Performed: TOTAL KNEE ARTHROPLASTY (Right Knee)     Patient location during evaluation: PACU Anesthesia Type: Spinal and Regional Level of consciousness: oriented and awake and alert Pain management: pain level controlled Vital Signs Assessment: post-procedure vital signs reviewed and stable Respiratory status: spontaneous breathing and respiratory function stable Cardiovascular status: blood pressure returned to baseline and stable Postop Assessment: no headache, no backache, no apparent nausea or vomiting, spinal receding and patient able to bend at knees Anesthetic complications: no   No complications documented.  Last Vitals:  Vitals:   06/30/20 1045 06/30/20 1055  BP: 121/87 130/88  Pulse: 88 81  Resp: 15 14  Temp: 36.6 C 36.6 C  SpO2: 98% 97%    Last Pain:  Vitals:   06/30/20 1045  TempSrc:   PainSc: 0-No pain                 Miana Politte,W. EDMOND

## 2020-06-30 NOTE — Discharge Instructions (Signed)
Ice to the knee constantly.  Keep the incision covered and clean and dry for one week, then ok to get it wet in the shower.  Do exercise as instructed every hour, please to prevent stiffness.  Be aggressive with your exercises!!! Move it or lose it!  DO NOT prop anything under the knee, it will make your knee stiff.  Prop under the ankle to encourage your knee to go straight. You may need to wear the brace at night to maintain your extension (straightening)    Use the walker while you are up and around for balance.  Wear your support stockings 24/7 to prevent blood clots and take baby aspirin twice daily for 30 days also to prevent blood clots.  You CAN place full weight on the right leg.   Please call [phone] for fevers above 102 degrees F Also call for progressive calf swelling and pain and inability to do calf pumps  Follow up with Dr Veverly Fells in two weeks in the office, call [phone] for appt

## 2020-06-30 NOTE — Anesthesia Procedure Notes (Signed)
Procedure Name: MAC Date/Time: 06/30/2020 7:31 AM Performed by: Niel Hummer, CRNA Pre-anesthesia Checklist: Patient identified, Emergency Drugs available, Suction available and Patient being monitored Oxygen Delivery Method: Simple face mask

## 2020-06-30 NOTE — Transfer of Care (Signed)
Immediate Anesthesia Transfer of Care Note  Patient: [name]  Procedure(s) Performed: TOTAL KNEE ARTHROPLASTY (Right Knee)  Patient Location: PACU  Anesthesia Type:Spinal  Level of Consciousness: awake, alert  and oriented  Airway & Oxygen Therapy: Patient Spontanous Breathing and Patient connected to face mask oxygen  Post-op Assessment: Report given to RN and Post -op Vital signs reviewed and stable  Post vital signs: Reviewed and stable  Last Vitals:  Vitals Value Taken Time  BP    Temp    Pulse 101 06/30/20 0942  Resp 16 06/30/20 0942  SpO2 98 % 06/30/20 0942  Vitals shown include unvalidated device data.  Last Pain:  Vitals:   06/30/20 0545  TempSrc: Oral  PainSc:       Patients Stated Pain Goal: 3 (09/81/19 1478)  Complications: No complications documented.

## 2020-06-30 NOTE — Interval H&P Note (Signed)
History and Physical Interval Note:  06/30/2020 7:23 AM  [name]  has presented today for surgery, with the diagnosis of Right total knee end stage osteoarthritis.  The various methods of treatment have been discussed with the patient and family. After consideration of risks, benefits and other options for treatment, the patient has consented to  Procedure(s): TOTAL KNEE ARTHROPLASTY (Right) as a surgical intervention.  The patient's history has been reviewed, patient examined, no change in status, stable for surgery.  I have reviewed the patient's chart and labs.  Questions were answered to the patient's satisfaction.     [name]

## 2020-06-30 NOTE — Evaluation (Addendum)
Physical Therapy Evaluation Patient Details Name: [name] MRN: [id number] DOB: [birth year] Today's Date: 06/30/2020   History of Present Illness  Patient is 60 y.o. female s/p Rt TKA on 06/30/20 with PMH signficant for fibromyalgia, OA, GERD, GBS, TMJ, ACDF C5-7.  Clinical Impression  Pt is a 60yo female s/p R TKA POD 0. Pt reports that she is modified independent with use of cane and knee brace for mobility prior to surgery. Pt required MIN guard progressing to supervision and verbal cues for sit to stand transfers.Pt required MIN guard for ambulation 40' with verbal cues for RW management and step to gait pattern with no LOB or knee buckling. Pt was able to safely perform stair negotiation with 1 rail and SPC and MIN assist with cues for sequencing. Pt's daughter was present and able to demonstrate proper positioning for guarding with ambulation and stairs. PT reviewed WBAT status with pt and HEP handout. Pt is at a safe mobility level for discharge home with family support, pt will be staying with her daughter for 2 weeks. Recommend home with family support. Pt will benefit from skilled PT to increase independence and safety with mobility.      Follow Up Recommendations Outpatient PT;Follow surgeon's recommendation for DC plan and follow-up therapies    Equipment Recommendations  Rolling walker with 5" wheels    Recommendations for Other Services       Precautions / Restrictions Precautions Precautions: Fall Required Braces or Orthoses: Knee Immobilizer - Right Knee Immobilizer - Right:  (at bedtime; reviewed with pt and daughter) Restrictions Weight Bearing Restrictions: No Other Position/Activity Restrictions: WBAT      Mobility  Bed Mobility Overal bed mobility: Modified Independent             General bed mobility comments: pt able to transfer supine to sit with use of bed rails and B UEs to scoot to EOB    Transfers Overall transfer level: Needs  assistance Equipment used: Rolling walker (2 wheeled) Transfers: Sit to/from Stand Sit to Stand: Min guard;Supervision         General transfer comment: Pt performed sit<>stand from EOB with MIN guard for safety and from [facility] with supervision and use of rail and RW for power up to stand.  Ambulation/Gait Ambulation/Gait assistance: Min Gaffer (Feet): 80 Feet Assistive device: Rolling walker (2 wheeled) Gait Pattern/deviations: Step-to pattern;Decreased stride length;Narrow base of support;Decreased weight shift to right Gait velocity: decr   General Gait Details: Pt ambulated with MIN guard progressing to supervision with verbal cues for RW management, incr BOS of LEs, and step to gait pattern with no observed LOB or knee buckling. Pt's daughter present and able to demonstrate safe guarding position with verbal cues from therapist.  Stairs Stairs: Yes Stairs assistance: Min assist Stair Management: One rail Left;Forwards;With cane Number of Stairs: 3 General stair comments: pt safely negotiated stairs with MIN assist and verbal cues for sequencing. Pt's daughter demonstrated proper guarding position with verbal cues from therapist.  Wheelchair Mobility    Modified Rankin (Stroke Patients Only)       Balance Overall balance assessment: Needs assistance Sitting-balance support: Feet supported Sitting balance-Leahy Scale: Good     Standing balance support: Bilateral upper extremity supported Standing balance-Leahy Scale: Poor Standing balance comment: use of RW to maintain standing balance  Pertinent Vitals/Pain Pain Assessment: 0-10 Pain Score: 4  Pain Location: R knee Pain Descriptors / Indicators: Sore;Discomfort Pain Intervention(s): Limited activity within patient's tolerance;Monitored during session;Repositioned    Home Living Family/patient expects to be discharged to:: Private residence Living  Arrangements: Children Available Help at Discharge: Family Type of Home: House Home Access: Stairs to enter Entrance Stairs-Rails: Left Entrance Stairs-Number of Steps: 3 Home Layout: One level Home Equipment: Environmental consultant - 4 wheels;Cane - quad;Shower seat Additional Comments: pt is staying with her daughter for 2 weeks    Prior Function Level of Independence: Independent with assistive device(s)         Comments: quad cane and knee brace     Hand Dominance   Dominant Hand: Right    Extremity/Trunk Assessment   Upper Extremity Assessment Upper Extremity Assessment: Overall WFL for tasks assessed    Lower Extremity Assessment Lower Extremity Assessment: RLE deficits/detail RLE Deficits / Details: pt with 4/5 quad set and Ankle PF/DF strength. Pt able to complete full SLR with no extensor lag observed. RLE Sensation: WNL RLE Coordination: WNL    Cervical / Trunk Assessment Cervical / Trunk Assessment: Normal  Communication   Communication: No difficulties  Cognition Arousal/Alertness: Awake/alert Behavior During Therapy: WFL for tasks assessed/performed Overall Cognitive Status: Within Functional Limits for tasks assessed                                        General Comments      Exercises Total Joint Exercises Ankle Circles/Pumps: AROM;Both;20 reps;Supine Quad Sets: AROM;Right;5 reps;Supine Short Arc Quad: AROM;Right;5 reps;Supine Heel Slides: AROM;Right;5 reps;Supine Hip ABduction/ADduction: AROM;Right;5 reps;Supine   Assessment/Plan    PT Assessment Patient needs continued PT services  PT Problem List Decreased strength;Decreased range of motion;Decreased activity tolerance;Decreased balance;Decreased mobility;Decreased knowledge of use of DME;Decreased safety awareness;Decreased knowledge of precautions;Pain       PT Treatment Interventions DME instruction;Gait training;Functional mobility training;Stair training;Therapeutic  activities;Therapeutic exercise;Balance training;Patient/family education    PT Goals (Current goals can be found in the Care Plan section)  Acute Rehab PT Goals Patient Stated Goal: to go home PT Goal Formulation: With patient/family Time For Goal Achievement: 07/07/20 Potential to Achieve Goals: Good    Frequency 7X/week   Barriers to discharge        Co-evaluation               AM-PAC PT "6 Clicks" Mobility  Outcome Measure Help needed turning from your back to your side while in a flat bed without using bedrails?: A Little Help needed moving from lying on your back to sitting on the side of a flat bed without using bedrails?: A Little Help needed moving to and from a bed to a chair (including a wheelchair)?: A Little Help needed standing up from a chair using your arms (e.g., wheelchair or bedside chair)?: A Little Help needed to walk in hospital room?: A Little Help needed climbing 3-5 steps with a railing? : A Little 6 Click Score: 18    End of Session Equipment Utilized During Treatment: Gait belt Activity Tolerance: Patient tolerated treatment well Patient left: in bed;with call bell/phone within reach;with family/visitor present;with nursing/sitter in room Nurse Communication: Mobility status PT Visit Diagnosis: Unsteadiness on feet (R26.81);Muscle weakness (generalized) (M62.81);Pain Pain - Right/Left: Right Pain - part of body: Knee    Time: 0630-1601 PT Time Calculation (min) (ACUTE ONLY): 44 min  Charges:              Elna Breslow, SPT  Acute rehab    Elna Breslow 06/30/2020, 6:35 PM

## 2020-06-30 NOTE — Brief Op Note (Signed)
06/30/2020  9:37 AM  PATIENT:  [name]  60 y.o. female  PRE-OPERATIVE DIAGNOSIS:  Right knee end stage osteoarthritis  POST-OPERATIVE DIAGNOSIS:  Right knee end stage osteoarthritis  PROCEDURE:  Procedure(s): TOTAL KNEE ARTHROPLASTY (Right) DePuy Attune   SURGEON:  Surgeon(s) and Role:    Netta Cedars, MD - Primary  PHYSICIAN ASSISTANT:   ASSISTANTS: Gerrit Halls PA-C   ANESTHESIA:   regional and spinal  EBL:  50 mL   BLOOD ADMINISTERED:none  DRAINS: none   LOCAL MEDICATIONS USED:  MARCAINE  With Exparel  SPECIMEN:  No Specimen  DISPOSITION OF SPECIMEN:  N/A  COUNTS:  YES  TOURNIQUET:   Total Tourniquet Time Documented: Thigh (Right) - 97 minutes Total: Thigh (Right) - 97 minutes   DICTATION: .Other Dictation: Dictation Number [id number]  PLAN OF CARE: Discharge to home after PACU  PATIENT DISPOSITION:  PACU - hemodynamically stable.   Delay start of Pharmacological VTE agent (>24hrs) due to surgical blood loss or risk of bleeding: no

## 2020-06-30 NOTE — Op Note (Signed)
NAME: [name], [name] MEDICAL RECORD [id number] ACCOUNT [id number] DATE OF BIRTH:1960-06-27 FACILITY: [facility] LOCATION: [facility] PHYSICIAN:STEVEN Orlena Sheldon, MD  OPERATIVE REPORT  DATE OF PROCEDURE:  06/30/2020  PREOPERATIVE DIAGNOSIS:  Right knee end-stage arthritis.  POSTOPERATIVE DIAGNOSIS:  Right knee end-stage arthritis.  PROCEDURE PERFORMED:  Right total knee arthroplasty using DePuy Attune prosthesis.  ATTENDING SURGEON:  Esmond Plants, MD  ASSISTANT:  Gerrit Halls, PA-C, who was scrubbed during the entire procedure and necessary for satisfactory completion of surgery.  ANESTHESIA:  Spinal anesthesia plus adductor canal block was used.  ESTIMATED BLOOD LOSS:  Minimal.  FLUID REPLACEMENT:  1500 mL crystalloid.  INSTRUMENT COUNTS:  Correct.  COMPLICATIONS:  No complications.  ANTIBIOTICS:  Perioperative antibiotics were given.  INDICATIONS:  The patient is a 60 year old female with a history of worsening right knee pain and deformity including a significant valgus deformity of the knee.  The patient has failed all conservative measures and presents for total knee arthroplasty  to restore mechanical alignment and eliminate pain and restore function of the knee.  Informed consent obtained.  DESCRIPTION OF PROCEDURE:  After an adequate level of spinal anesthesia and the adductor canal block was achieved, the patient was positioned supine on the operating table.  Right leg correctly identified and a nonsterile tourniquet placed on proximal  thigh.  Right leg sterilely prepped and draped in the usual fashion.  The down leg had been padded appropriately and secured to the table.   After our timeout, we elevated the limb, exsanguinated the limb with the Esmarch bandage and then elevated the  tourniquet to 325 mmHg.  We placed the knee in flexion.  Longitudinal midline incision was created with the knee in flexion.  Dissection down through subcutaneous tissues.  A median  parapatellar arthrotomy was created with a fresh 10 blade scalpel.   Lateral patellofemoral ligaments divided and the patella everted.  Distal femur entered with a step cut drill.  We then placed our intramedullary resection guide set on 9 mm of resection and 3 degrees right.  At this point, once we did our distal  resection, we sized our femur to a size 4 anterior down and used the 4-in-1 cutting block to cut the anterior, posterior and chamfer cuts.  At this point, we resected ACL, PCL, meniscal tissue, subluxed the tibia anteriorly and performed a tibial cut 90  degrees perpendicular to the long axis of the tibia with minimal posterior slope for this posterior cruciate substituting DePuy Attune prosthesis.  Once we had our tibial cut performed with 2 mm off the medial side giving [place] a nice perpendicular cut, we  used a laminar spreader, removed excess osteophytes off the posterior femoral condyles.  We then checked our flexion and extension gaps.  We were little tight in extension compared to flexion.  Thus, we went ahead and made a decision to cut 2 more  millimeters of distal femur, which we did using an angel wing and the distal femoral guide.  Once we pinned that, we did our distal cut 2 more millimeters and then we revised our chamfer cuts accordingly.  At this point, we had symmetric flexion and  extension gap at 5 mm.  We then went ahead and completed our tibial preparation using the [place] subluxing the tibia anteriorly and then sizing the tibia to a size 4.  We did our modular drill and keel punch and had our trial component in place,  maximally externally rotated to improve our patellar tracking.  At  this point, we completed our femoral preparation with cutting for the box cut guide, which was a size 4 right.  Once we had our box cut, then we drilled our lug holes and placed our trial  femur in place.  With the trial in place and the 5 mm poly insert, we were able to get full extension.  At this  point, we resurfaced our patella going from a 22 mm thickness and resecting 7.5 mm off, sizing for a 35 patella.  We drilled our lug holes  and then placed our patellar button in place.  We had a little bit of lateral tilt.  We thus did a lateral release, which allowed that patella to lay flat in the trochlea and then we had nice patellar tracking with no-touch technique.  We removed all  trial components, pulse irrigated the bone well.  With all the trials removed, we pulse irrigated the bone, vacuum mixed DePuy high viscosity cement on the back table and cemented the components into place, size 4 tibia, 4 right femur with the lug holes  and then the 35 patella and we held the patellar compression with a clamp until the cement was hardened.  We placed a size 4, 5 mm poly insert in place and held the knee in extension until the cement was hardened.  Once the cement was hardened, we  removed the trial poly insert and removed all the excess cement with 1/4 inch curved osteotome.  We then made sure we had our posterior aspect of the knee free of any loose cement and then placed our 4 x 5 mm polyethylene insert on to the tibial tray and  then reduced the knee.  We had a nice little pop as that medial femoral condyle reduced, this indicating appropriate tension in flexion.  We then placed the knee in extension.  We were able to get full extension with very nice alignment and good  flexion, stability at 30 degrees and 90 degrees.  Patellar tracking was excellent.  We irrigated thoroughly and then we had done Exparel with Marcaine into the posterior aspect of the knee during our removing the posterior femoral condyle osteophytes and  then we placed Exparel as the cement was setting up into the capsule throughout the anterior aspect of the knee in the suprapatellar pouch for a total of about 50 mL of Exparel with Marcaine.  Once we had that completed, we closed our parapatellar  arthrotomy with #1 Vicryl suture  interrupted, 2-0 Vicryl for subcutaneous closure and 4-0 running Monocryl for skin with Steri-Strips.  A sterile compressive bandage applied followed by Ace wrap and knee immobilizer.  The patient transported to recovery  room with the tourniquet deflated at 89 minutes.  The patient was transported to recovery room in stable condition having tolerated surgery well.  HN/NUANCE  D:06/30/2020 T:06/30/2020 JOB:[id number]

## 2020-06-30 NOTE — Anesthesia Procedure Notes (Signed)
Spinal  Patient location during procedure: OR Start time: 06/30/2020 7:31 AM End time: 06/30/2020 7:34 AM Staffing Performed: anesthesiologist  Anesthesiologist: Roderic Palau, MD Preanesthetic Checklist Completed: patient identified, IV checked, risks and benefits discussed, surgical consent, monitors and equipment checked, pre-op evaluation and timeout performed Spinal Block Patient position: sitting Prep: DuraPrep Patient monitoring: cardiac monitor, continuous pulse ox and blood pressure Approach: midline Location: L4-5 Injection technique: single-shot Needle Needle type: Pencan  Needle gauge: 24 G Needle length: 9 cm Assessment Sensory level: T8 Additional Notes Functioning IV was confirmed and monitors were applied. Sterile prep and drape, including hand hygiene and sterile gloves were used. The patient was positioned and the spine was prepped. The skin was anesthetized with lidocaine.  Free flow of clear CSF was obtained prior to injecting local anesthetic into the CSF.  The spinal needle aspirated freely following injection.  The needle was carefully withdrawn.  The patient tolerated the procedure well.

## 2020-06-30 NOTE — Anesthesia Procedure Notes (Signed)
Anesthesia Regional Block: Adductor canal block   Pre-Anesthetic Checklist: ,, timeout performed, Correct Patient, Correct Site, Correct Laterality, Correct Procedure, Correct Position, site marked, Risks and benefits discussed, pre-op evaluation,  At surgeon's request and post-op pain management  Laterality: Right  Prep: Maximum Sterile Barrier Precautions used, chloraprep       Needles:  Injection technique: Single-shot  Needle Type: Echogenic Stimulator Needle     Needle Length: 9cm  Needle Gauge: 21     Additional Needles:   Procedures:,,,, ultrasound used (permanent image in chart),,,,  Narrative:  Start time: 06/30/2020 7:10 AM End time: 06/30/2020 7:20 AM Injection made incrementally with aspirations every 5 mL.  Performed by: Personally  Anesthesiologist: Roderic Palau, MD  Additional Notes: 2% Lidocaine skin wheel.

## 2020-07-03 ENCOUNTER — Encounter: Payer: Self-pay | Admitting: Physical Therapy

## 2020-07-03 ENCOUNTER — Other Ambulatory Visit: Payer: Self-pay

## 2020-07-03 ENCOUNTER — Ambulatory Visit: Payer: 59 | Attending: Physician Assistant | Admitting: Physical Therapy

## 2020-07-03 DIAGNOSIS — M25561 Pain in right knee: Secondary | ICD-10-CM | POA: Diagnosis present

## 2020-07-03 DIAGNOSIS — R600 Localized edema: Secondary | ICD-10-CM | POA: Diagnosis present

## 2020-07-03 DIAGNOSIS — R262 Difficulty in walking, not elsewhere classified: Secondary | ICD-10-CM | POA: Insufficient documentation

## 2020-07-03 DIAGNOSIS — M6281 Muscle weakness (generalized): Secondary | ICD-10-CM | POA: Insufficient documentation

## 2020-07-03 NOTE — Therapy (Signed)
[facility] [place], [place], [address] Phone: [phone]   Fax:  [phone]  Physical Therapy Evaluation  Patient Details  Name: [name] MRN: [id number] Date of Birth: 07/23/1960 Referring Provider (PT): Shelle Iron, [place]   Encounter Date: 07/03/2020   PT End of Session - 07/03/20 1016    Visit Number 1    Number of Visits 12    Date for PT Re-Evaluation 08/07/20    Authorization Type FOTO; Progress note every 10th visit    PT Start Time 0900    PT Stop Time 0942    PT Time Calculation (min) 42 min    Equipment Utilized During Treatment Gait belt;Other (comment);Right knee immobilizer   rolling walker   Activity Tolerance Patient tolerated treatment well    Behavior During Therapy WFL for tasks assessed/performed           Past Medical History:  Diagnosis Date  . Arthritis    right hand  . Dyslipidemia   . Family history of anesthesia complication    patients mother has severe nausea and vomiting after  . Fibromyalgia   . GERD (gastroesophageal reflux disease)   . Guillain-Barre syndrome ([place])   . Hiatal hernia   . History of kidney stones   . Kidney stones   . Migraines    one/month  . PONV (postoperative nausea and vomiting)   . TMJ (dislocation of temporomandibular joint)   . UTI (urinary tract infection)    hx of  . Vitamin D deficiency     Past Surgical History:  Procedure Laterality Date  . ABDOMINAL HYSTERECTOMY    . ADNOIDS    . ANTERIOR CERVICAL DECOMP/DISCECTOMY FUSION N/A 10/27/2013   Procedure: ACDF/ANTERIOR CERVICAL DECOMPRESSION/DISCECTOMY FUSION  C5-C7  (2 LEVELS);  Surgeon: Melina Schools, MD;  Location: [facility];  Service: Orthopedics;  Laterality: N/A;  . COLONOSCOPY  05/26/2020  . EYE SURGERY Bilateral    cataract removal  . KNEE ARTHROSCOPY Right   . UPPER GASTROINTESTINAL ENDOSCOPY  05/26/2020    There were no vitals filed for this visit.    Subjective Assessment -  07/03/20 1008    Subjective COVID-19 screening performed upon arrival. Patient arrives to physical therapy with right knee pain, difficulty walking and difficulty performing ADLs secondary to R TKA on 06/30/2020. Patient currently living with her daugher in one story home and is gaining assistance from her for ADLs such as dressing. Patient ambulates around home with rolling walker throughout home. Patient is consistent with performing HEP provided by surgeon. Patient report pain at worst as 10/10 and pain at best as 3/10. Patient's goals are to decrease pain, improve movement, and improve ability to perform ADLs and home activities.    Pertinent History right TKA 06/30/2020; Fibromyalgia, migranes, anterior cervical decompression fusio n5/2015    Limitations Sitting;Standing;Walking;House hold activities    Diagnostic tests x-ray: inflammation    Patient Stated Goals strengthen enough to prolong TKA    Currently in Pain? Yes    Pain Score 3     Pain Location Knee    Pain Orientation Right    Pain Descriptors / Indicators Throbbing    Pain Type Surgical pain    Pain Onset In the past 7 days    Pain Frequency Constant    Aggravating Factors  movement    Pain Relieving Factors elevation and rest    Effect of Pain on Daily Activities pain with ADLs and home activities  [facility] PT Assessment - 07/03/20 0001      Assessment   Medical Diagnosis Pain in right knee    Referring Provider (PT) Shelle Iron, PA-C    Onset Date/Surgical Date 06/30/20    Next MD Visit 2 weeks    Prior Therapy yes      Precautions   Precautions Other (comment)    Precaution Comments no ultrasound      Restrictions   Weight Bearing Restrictions No      Balance Screen   Has the patient fallen in the past 6 months No    Has the patient had a decrease in activity level because of a fear of falling?  Yes    Is the patient reluctant to leave their home because of a fear of falling?  No      Home  Environment   Living Environment Private residence    Living Arrangements Alone   Living with daughter currently   Type of [place] to enter    Entrance Stairs-Number of Steps 3    Entrance Stairs-Rails Left      Prior Function   Level of Independence Needs assistance with ADLs;Needs assistance with homemaking;Independent with household mobility with device      Observation/Other Assessments   Focus on Therapeutic Outcomes (FOTO)  79% limitation      AROM   Overall AROM  Deficits;Due to pain    AROM Assessment Site Knee    Right/Left Knee Right    Right Knee Extension 8    Right Knee Flexion 54      PROM   Overall PROM  Deficits;Due to pain    PROM Assessment Site Knee    Right/Left Knee Right    Right Knee Extension 5    Right Knee Flexion 62      Palpation   Palpation comment tenderness to R quad and ITB upon assessment      Transfers   Comments requires left LE to hook to raise on/off plinth.      Ambulation/Gait   Assistive device Rolling walker    Gait Pattern Step-through pattern;Decreased stance time - right;Decreased stride length;Decreased step length - left;Decreased hip/knee flexion - right;Decreased dorsiflexion - right;Decreased weight shift to right;Antalgic;Trunk flexed                      Objective measurements completed on examination: See above findings.       [facility] Adult PT Treatment/Exercise - 07/03/20 0001      Modalities   Modalities Vasopneumatic      Vasopneumatic   Number Minutes Vasopneumatic  10 minutes    Vasopnuematic Location  Knee    Vasopneumatic Pressure Low    Vasopneumatic Temperature  34                  PT Education - 07/03/20 1015    Education Details continue with HEP provided by surgeon    Person(s) Educated Patient;Child(ren)    Methods Explanation    Comprehension Verbalized understanding            PT Short Term Goals - 02/15/20 1308      PT SHORT TERM GOAL #1    Title STG=LTG             PT Long Term Goals - 07/03/20 1023      PT LONG TERM GOAL #1   Title Patient will be independent with HEP  Time 4    Period Weeks    Status New      PT LONG TERM GOAL #2   Title Patient will demonstrate 5 degrees or less of right knee extension AROM to improve gait mechanics.    Time 4    Period Weeks    Status New      PT LONG TERM GOAL #3   Title Patient will demonstrate 115+ degrees of right knee flexion AROM to improve functional tasks.    Time 4    Period Weeks    Status New      PT LONG TERM GOAL #4   Title Patient will demonstrate 4/5 or greater right knee MMT in all planes to improve stability during functional tasks.    Time 4    Period Weeks    Status New      PT LONG TERM GOAL #5   Title Patient will report ability to perform ADLs, home activities, and work activites with right knee pain less than or equal to 3/10.    Time 4    Period Weeks    Status New                  Plan - 07/03/20 1017    Clinical Impression Statement Patient is a 60 year old female who presents to physical therapy with her daugher with right knee pain, right knee ROM, and difficulty walking secondary to 07/03/2020. Patient's knee dressed with gauze. Patient very tender to palpation to R quad, ITB, and R calf. Patient requires left LE to raise right LE on/off plinth. Patient and PT discussed plan of care, HEP, and reviewed any questions. Patient and patient's daughter reported understanding. Patient would benefit from skilled physical therapy to address deficits and address goals.    Personal Factors and Comorbidities Age;Time since onset of injury/illness/exacerbation    Examination-Activity Limitations Locomotion Level;Transfers;Stairs;Stand    Examination-Participation Restrictions Cleaning;Occupation    Stability/Clinical Decision Making Stable/Uncomplicated    Clinical Decision Making Low    Rehab Potential Good    PT Frequency 3x / week     PT Duration 4 weeks    PT Treatment/Interventions ADLs/Self Care Home Management;Iontophoresis 4mg /ml Dexamethasone;Gait training;Stair training;Functional mobility training;Therapeutic activities;Electrical Stimulation;Therapeutic exercise;Balance training;Neuromuscular re-education;Manual techniques;Passive range of motion;Patient/family education;Vasopneumatic Device;Moist Heat    PT Next Visit Plan Nustep, R knee ROM in stting, standing and supine, PROM to improve range, modalities PRN for pain relief.    PT Home Exercise Plan continue HEP provided by surgeon    Consulted and Agree with Plan of Care Patient;Family member/caregiver    Family Member Consulted Daughter           Patient will benefit from skilled therapeutic intervention in order to improve the following deficits and impairments:  Difficulty walking,Decreased balance,Decreased activity tolerance,Decreased strength,Increased edema,Pain,Decreased range of motion,Abnormal gait  Visit Diagnosis: Acute pain of right knee - Plan: PT plan of care cert/re-cert  Muscle weakness (generalized) - Plan: PT plan of care cert/re-cert  Difficulty in walking, not elsewhere classified - Plan: PT plan of care cert/re-cert  Localized edema - Plan: PT plan of care cert/re-cert     Problem List Patient Active Problem List   Diagnosis Date Noted  . Rectal bleeding 05/10/2020  . Pain management contract agreement 05/16/2016  . Insomnia 04/13/2015  . BMI 26.0-26.9,adult 04/13/2015  . Chronic nausea 10/12/2014  . GAD (generalized anxiety disorder) 05/25/2014  . Hyperlipidemia 11/04/2012  . Migraines 11/04/2012  . GERD (gastroesophageal reflux  disease) 11/04/2012  . Fibromyalgia 11/04/2012  . TMJ arthropathy 11/04/2012    Gabriela Eves, PT, DPT 07/03/2020, 10:35 AM  [facility] [address] [place], [place], [address] Phone: [phone]   Fax:  [phone]  Name: [name] MRN: [id number] Date of Birth: Nov 24, 1960

## 2020-07-03 NOTE — Progress Notes (Signed)
   Dilaudid 0.5 mg wasted on 07/03/2020 at 0945am with Salome Spotted, RN; medication not wasted on day of surgery

## 2020-07-05 ENCOUNTER — Other Ambulatory Visit: Payer: Self-pay | Admitting: Nurse Practitioner

## 2020-07-05 ENCOUNTER — Ambulatory Visit: Payer: 59 | Admitting: Physical Therapy

## 2020-07-05 ENCOUNTER — Encounter: Payer: Self-pay | Admitting: Physical Therapy

## 2020-07-05 ENCOUNTER — Other Ambulatory Visit: Payer: Self-pay

## 2020-07-05 DIAGNOSIS — R262 Difficulty in walking, not elsewhere classified: Secondary | ICD-10-CM

## 2020-07-05 DIAGNOSIS — M6281 Muscle weakness (generalized): Secondary | ICD-10-CM

## 2020-07-05 DIAGNOSIS — R600 Localized edema: Secondary | ICD-10-CM

## 2020-07-05 DIAGNOSIS — M797 Fibromyalgia: Secondary | ICD-10-CM

## 2020-07-05 DIAGNOSIS — M25561 Pain in right knee: Secondary | ICD-10-CM | POA: Diagnosis not present

## 2020-07-05 NOTE — Therapy (Signed)
[facility] [place], [place], [address] Phone: [phone]   Fax:  [phone]  Physical Therapy Treatment  Patient Details  Name: [name] MRN: [id number] Date of Birth: [birth year] Referring Provider (PT): Shelle Iron, [place]   Encounter Date: 07/05/2020   PT End of Session - 07/05/20 1146    Visit Number 2    Number of Visits 12    Date for PT Re-Evaluation 08/07/20    Authorization Type FOTO; Progress note every 10th visit    PT Start Time 0900    PT Stop Time 0948    PT Time Calculation (min) 48 min    Equipment Utilized During Treatment Other (comment)   rolling walker   Activity Tolerance Patient tolerated treatment well    Behavior During Therapy [facility] for tasks assessed/performed           Past Medical History:  Diagnosis Date  . Arthritis    right hand  . Dyslipidemia   . Family history of anesthesia complication    patients mother has severe nausea and vomiting after  . Fibromyalgia   . GERD (gastroesophageal reflux disease)   . Guillain-Barre syndrome ([place])   . Hiatal hernia   . History of kidney stones   . Kidney stones   . Migraines    one/month  . PONV (postoperative nausea and vomiting)   . TMJ (dislocation of temporomandibular joint)   . UTI (urinary tract infection)    hx of  . Vitamin D deficiency     Past Surgical History:  Procedure Laterality Date  . ABDOMINAL HYSTERECTOMY    . ADNOIDS    . ANTERIOR CERVICAL DECOMP/DISCECTOMY FUSION N/A 10/27/2013   Procedure: ACDF/ANTERIOR CERVICAL DECOMPRESSION/DISCECTOMY FUSION  C5-C7  (2 LEVELS);  Surgeon: Melina Schools, MD;  Location: [facility];  Service: Orthopedics;  Laterality: N/A;  . COLONOSCOPY  05/26/2020  . EYE SURGERY Bilateral    cataract removal  . KNEE ARTHROSCOPY Right   . TOTAL KNEE ARTHROPLASTY Right 06/30/2020   Procedure: TOTAL KNEE ARTHROPLASTY;  Surgeon: Netta Cedars, MD;  Location: [facility];  Service: Orthopedics;   Laterality: Right;  . UPPER GASTROINTESTINAL ENDOSCOPY  05/26/2020    There were no vitals filed for this visit.   Subjective Assessment - 07/05/20 1032    Subjective COVID-19 screening performed upon arrival. Patient reports 6 or 7/10 in right knee. Arrived without her immobilizer.    Pertinent History right TKA 06/30/2020; Fibromyalgia, migranes, anterior cervical decompression fusio n5/2015    Limitations Sitting;Standing;Walking;House hold activities    Patient Stated Goals get back to normal    Currently in Pain? Yes    Pain Score 7     Pain Location Knee    Pain Orientation Right    Pain Descriptors / Indicators Throbbing    Pain Type Surgical pain    Pain Onset In the past 7 days    Pain Frequency Constant              [facility] PT Assessment - 07/05/20 0001      Assessment   Medical Diagnosis Pain in right knee    Referring Provider (PT) Shelle Iron, PA-C    Onset Date/Surgical Date 06/30/20    Next MD Visit 2 weeks    Prior Therapy yes      Precautions   Precautions Other (comment)    Precaution Comments no ultrasound      Restrictions   Weight Bearing Restrictions No  [facility] Adult PT Treatment/Exercise - 07/05/20 0001      Exercises   Exercises Knee/Hip      Knee/Hip Exercises: Stretches   Gastroc Stretch Right;3 reps;Other (comment)   5" seconds     Knee/Hip Exercises: Aerobic   Nustep Level 1 x10 mins moving from seat 12 to 11      Knee/Hip Exercises: Seated   Long Arc Quad AAROM;Right;2 sets;10 reps    Heel Slides AROM;Right;2 sets;10 reps      Modalities   Modalities Vasopneumatic      Vasopneumatic   Number Minutes Vasopneumatic  15 minutes    Vasopnuematic Location  Knee    Vasopneumatic Pressure Low    Vasopneumatic Temperature  34      Manual Therapy   Manual Therapy Passive ROM    Passive ROM PROM into right knee flexion and extension with gentle holds and frequent oscillations to decrease pain and  muscle guarding                    PT Short Term Goals - 02/15/20 1308      PT SHORT TERM GOAL #1   Title STG=LTG             PT Long Term Goals - 07/03/20 1023      PT LONG TERM GOAL #1   Title Patient will be independent with HEP    Time 4    Period Weeks    Status New      PT LONG TERM GOAL #2   Title Patient will demonstrate 5 degrees or less of right knee extension AROM to improve gait mechanics.    Time 4    Period Weeks    Status New      PT LONG TERM GOAL #3   Title Patient will demonstrate 115+ degrees of right knee flexion AROM to improve functional tasks.    Time 4    Period Weeks    Status New      PT LONG TERM GOAL #4   Title Patient will demonstrate 4/5 or greater right knee MMT in all planes to improve stability during functional tasks.    Time 4    Period Weeks    Status New      PT LONG TERM GOAL #5   Title Patient will report ability to perform ADLs, home activities, and work activites with right knee pain less than or equal to 3/10.    Time 4    Period Weeks    Status New                 Plan - 07/05/20 1147    Clinical Impression Statement Patient arrived with ongoing right knee pain but able to ambulates without immobilizer. Patient guided through TEs for ROM and muscle activation. Patient noted with significant pain therefore frequent rest breaks were provided. Patient still with left LE hooking to raise right LE onto plinth. PROM in supine performed with reports of pain but smooth motions with minimal muscle guarding. Patient and PT discussed progressing in time with knee extension pillow as tolerated. Patient reported understanding. Patient with no adverse affects upon removal of modalities.    Personal Factors and Comorbidities Age;Time since onset of injury/illness/exacerbation    Examination-Activity Limitations Locomotion Level;Transfers;Stairs;Stand    Examination-Participation Restrictions Cleaning;Occupation     Stability/Clinical Decision Making Stable/Uncomplicated    Clinical Decision Making Low    Rehab Potential Good    PT Frequency 3x /  week    PT Duration 4 weeks    PT Treatment/Interventions ADLs/Self Care Home Management;Iontophoresis 4mg /ml Dexamethasone;Gait training;Stair training;Functional mobility training;Therapeutic activities;Electrical Stimulation;Therapeutic exercise;Balance training;Neuromuscular re-education;Manual techniques;Passive range of motion;Patient/family education;Vasopneumatic Device;Moist Heat    PT Next Visit Plan Nustep, R knee ROM in stting, standing and supine, PROM to improve range, modalities PRN for pain relief.    PT Home Exercise Plan continue HEP provided by surgeon    Consulted and Agree with Plan of Care Patient;Family member/caregiver    Family Member Consulted Daughter           Patient will benefit from skilled therapeutic intervention in order to improve the following deficits and impairments:  Difficulty walking,Decreased balance,Decreased activity tolerance,Decreased strength,Increased edema,Pain,Decreased range of motion,Abnormal gait  Visit Diagnosis: Acute pain of right knee  Muscle weakness (generalized)  Difficulty in walking, not elsewhere classified  Localized edema     Problem List Patient Active Problem List   Diagnosis Date Noted  . Rectal bleeding 05/10/2020  . Pain management contract agreement 05/16/2016  . Insomnia 04/13/2015  . BMI 26.0-26.9,adult 04/13/2015  . Chronic nausea 10/12/2014  . GAD (generalized anxiety disorder) 05/25/2014  . Hyperlipidemia 11/04/2012  . Migraines 11/04/2012  . GERD (gastroesophageal reflux disease) 11/04/2012  . Fibromyalgia 11/04/2012  . TMJ arthropathy 11/04/2012    Gabriela Eves PT, DPT 07/05/2020, 11:51 AM  [facility] [address] [place], [place], [address] Phone: [phone]   Fax:  [phone]  Name: NINETTE COTTA MRN:  [id number] Date of Birth: 11-02-1960

## 2020-07-07 ENCOUNTER — Other Ambulatory Visit: Payer: Self-pay

## 2020-07-07 ENCOUNTER — Encounter: Payer: Self-pay | Admitting: Physical Therapy

## 2020-07-07 ENCOUNTER — Ambulatory Visit: Payer: 59 | Admitting: Physical Therapy

## 2020-07-07 DIAGNOSIS — R600 Localized edema: Secondary | ICD-10-CM

## 2020-07-07 DIAGNOSIS — R262 Difficulty in walking, not elsewhere classified: Secondary | ICD-10-CM

## 2020-07-07 DIAGNOSIS — M25561 Pain in right knee: Secondary | ICD-10-CM

## 2020-07-07 DIAGNOSIS — M6281 Muscle weakness (generalized): Secondary | ICD-10-CM

## 2020-07-07 NOTE — Therapy (Signed)
[facility] [place], [place], [address] Phone: [phone]   Fax:  [phone]  Physical Therapy Treatment  Patient Details  Name: [name] MRN: [id number] Date of Birth: [birth year] Referring Provider (PT): Shelle Iron, [place]   Encounter Date: 07/07/2020   PT End of Session - 07/07/20 0911    Visit Number 3    Number of Visits 12    Date for PT Re-Evaluation 08/07/20    Authorization Type FOTO; Progress note every 10th visit    PT Start Time 0900    PT Stop Time 0948    PT Time Calculation (min) 48 min    Activity Tolerance Patient tolerated treatment well    Behavior During Therapy [facility] for tasks assessed/performed           Past Medical History:  Diagnosis Date  . Arthritis    right hand  . Dyslipidemia   . Family history of anesthesia complication    patients mother has severe nausea and vomiting after  . Fibromyalgia   . GERD (gastroesophageal reflux disease)   . Guillain-Barre syndrome ([place])   . Hiatal hernia   . History of kidney stones   . Kidney stones   . Migraines    one/month  . PONV (postoperative nausea and vomiting)   . TMJ (dislocation of temporomandibular joint)   . UTI (urinary tract infection)    hx of  . Vitamin D deficiency     Past Surgical History:  Procedure Laterality Date  . ABDOMINAL HYSTERECTOMY    . ADNOIDS    . ANTERIOR CERVICAL DECOMP/DISCECTOMY FUSION N/A 10/27/2013   Procedure: ACDF/ANTERIOR CERVICAL DECOMPRESSION/DISCECTOMY FUSION  C5-C7  (2 LEVELS);  Surgeon: Melina Schools, MD;  Location: [place];  Service: Orthopedics;  Laterality: N/A;  . COLONOSCOPY  05/26/2020  . EYE SURGERY Bilateral    cataract removal  . KNEE ARTHROSCOPY Right   . TOTAL KNEE ARTHROPLASTY Right 06/30/2020   Procedure: TOTAL KNEE ARTHROPLASTY;  Surgeon: Netta Cedars, MD;  [facility];  Service: Orthopedics;  Laterality: Right;  . UPPER GASTROINTESTINAL ENDOSCOPY  05/26/2020    There  were no vitals filed for this visit.   Subjective Assessment - 07/07/20 0859    Subjective COVID-19 screening performed upon arrival. Patient reported sleeping for the first time in months but waking up stiff. States calf cramp pain at 9/10.    Pertinent History right TKA 06/30/2020; Fibromyalgia, migranes, anterior cervical decompression fusio n5/2015    Limitations Sitting;Standing;Walking;House hold activities    Diagnostic tests x-ray: inflammation    Patient Stated Goals get back to normal    Currently in Pain? Yes    Pain Score 4     Pain Location Knee    Pain Orientation Right    Pain Descriptors / Indicators Sore    Pain Type Surgical pain    Pain Onset 1 to 4 weeks ago    Pain Frequency Constant              [facility] PT Assessment - 07/07/20 0001      Assessment   Medical Diagnosis Pain in right knee    Referring Provider (PT) Shelle Iron, PA-C    Onset Date/Surgical Date 06/30/20    Next MD Visit 2 weeks    Prior Therapy yes      Precautions   Precautions Other (comment)    Precaution Comments no ultrasound      Restrictions   Weight Bearing Restrictions No  [facility] Adult PT Treatment/Exercise - 07/07/20 0001      Exercises   Exercises Knee/Hip      Knee/Hip Exercises: Aerobic   Nustep Level 1 x13 mins moving from seat 12 to 10      Knee/Hip Exercises: Supine   Short Arc Quad Sets AROM;Right;2 sets;10 reps    Heel Slides AAROM;Right;1 set;10 reps   with knee flide     Modalities   Modalities Vasopneumatic      Vasopneumatic   Number Minutes Vasopneumatic  15 minutes    Vasopnuematic Location  Knee    Vasopneumatic Pressure Low    Vasopneumatic Temperature  34      Manual Therapy   Manual Therapy Passive ROM    Passive ROM PROM into right knee flexion and extension with gentle holds and frequent oscillations to decrease pain and muscle guarding                    PT Short Term Goals - 02/15/20 1308       PT SHORT TERM GOAL #1   Title STG=LTG             PT Long Term Goals - 07/03/20 1023      PT LONG TERM GOAL #1   Title Patient will be independent with HEP    Time 4    Period Weeks    Status New      PT LONG TERM GOAL #2   Title Patient will demonstrate 5 degrees or less of right knee extension AROM to improve gait mechanics.    Time 4    Period Weeks    Status New      PT LONG TERM GOAL #3   Title Patient will demonstrate 115+ degrees of right knee flexion AROM to improve functional tasks.    Time 4    Period Weeks    Status New      PT LONG TERM GOAL #4   Title Patient will demonstrate 4/5 or greater right knee MMT in all planes to improve stability during functional tasks.    Time 4    Period Weeks    Status New      PT LONG TERM GOAL #5   Title Patient will report ability to perform ADLs, home activities, and work activites with right knee pain less than or equal to 3/10.    Time 4    Period Weeks    Status New                 Plan - 07/07/20 8101    Clinical Impression Statement Patient arrives with more calf pain and cramping the pain and the knee itself. Patient able to perform nustep with improved fluidity of motion than last session and was able to get to seat 10. Active assist provided for SAQ. PROM provided for extension and flexion with pain at end ranges. Seated knee flexion measured at 72 degrees. Patient educated to monitor for heat from calf or fever and to call MD as it could be potienal for blood clots. Patient reported understanding. No adverse affects upon removal of modalities.    Personal Factors and Comorbidities Age;Time since onset of injury/illness/exacerbation    Examination-Activity Limitations Locomotion Level;Transfers;Stairs;Stand    Examination-Participation Restrictions Cleaning;Occupation    Stability/Clinical Decision Making Stable/Uncomplicated    Clinical Decision Making Low    Rehab Potential Good    PT Frequency 3x  / week    PT Duration 4  weeks    PT Treatment/Interventions ADLs/Self Care Home Management;Iontophoresis 4mg /ml Dexamethasone;Gait training;Stair training;Functional mobility training;Therapeutic activities;Electrical Stimulation;Therapeutic exercise;Balance training;Neuromuscular re-education;Manual techniques;Passive range of motion;Patient/family education;Vasopneumatic Device;Moist Heat    PT Next Visit Plan Nustep, R knee ROM in stting, standing and supine, PROM to improve range, modalities PRN for pain relief.    PT Home Exercise Plan continue HEP provided by surgeon    Consulted and Agree with Plan of Care Patient;Family member/caregiver           Patient will benefit from skilled therapeutic intervention in order to improve the following deficits and impairments:  Difficulty walking,Decreased balance,Decreased activity tolerance,Decreased strength,Increased edema,Pain,Decreased range of motion,Abnormal gait  Visit Diagnosis: Acute pain of right knee  Muscle weakness (generalized)  Difficulty in walking, not elsewhere classified  Localized edema     Problem List Patient Active Problem List   Diagnosis Date Noted  . Rectal bleeding 05/10/2020  . Pain management contract agreement 05/16/2016  . Insomnia 04/13/2015  . BMI 26.0-26.9,adult 04/13/2015  . Chronic nausea 10/12/2014  . GAD (generalized anxiety disorder) 05/25/2014  . Hyperlipidemia 11/04/2012  . Migraines 11/04/2012  . GERD (gastroesophageal reflux disease) 11/04/2012  . Fibromyalgia 11/04/2012  . TMJ arthropathy 11/04/2012    Gabriela Eves, PT, DPT 07/07/2020, 10:07 AM  [facility] [address] [place], [place], [address] Phone: [phone]   Fax:  [phone]  Name: [name] MRN: [id number] Date of Birth: January 06, 1961

## 2020-07-10 ENCOUNTER — Other Ambulatory Visit: Payer: Self-pay

## 2020-07-10 ENCOUNTER — Ambulatory Visit: Payer: 59 | Admitting: Physical Therapy

## 2020-07-10 DIAGNOSIS — M25561 Pain in right knee: Secondary | ICD-10-CM

## 2020-07-10 DIAGNOSIS — R600 Localized edema: Secondary | ICD-10-CM

## 2020-07-10 DIAGNOSIS — M6281 Muscle weakness (generalized): Secondary | ICD-10-CM

## 2020-07-10 DIAGNOSIS — R262 Difficulty in walking, not elsewhere classified: Secondary | ICD-10-CM

## 2020-07-10 NOTE — Therapy (Signed)
[facility] [place], [place], [address] Phone: [phone]   Fax:  [phone]  Physical Therapy Treatment  Patient Details  Name: [name] MRN: [id number] Date of Birth: January 25, 1961 Referring Provider (PT): Shelle Iron, [place]   Encounter Date: 07/10/2020   PT End of Session - 07/10/20 0909    Visit Number 4    Number of Visits 12    Date for PT Re-Evaluation 08/07/20    Authorization Type FOTO; Progress note every 10th visit    PT Start Time 0900    PT Stop Time 0948    PT Time Calculation (min) 48 min    Equipment Utilized During Treatment Other (comment)   rollator   Activity Tolerance Patient tolerated treatment well    Behavior During Therapy [facility] for tasks assessed/performed           Past Medical History:  Diagnosis Date  . Arthritis    right hand  . Dyslipidemia   . Family history of anesthesia complication    patients mother has severe nausea and vomiting after  . Fibromyalgia   . GERD (gastroesophageal reflux disease)   . Guillain-Barre syndrome ([place])   . Hiatal hernia   . History of kidney stones   . Kidney stones   . Migraines    one/month  . PONV (postoperative nausea and vomiting)   . TMJ (dislocation of temporomandibular joint)   . UTI (urinary tract infection)    hx of  . Vitamin D deficiency     Past Surgical History:  Procedure Laterality Date  . ABDOMINAL HYSTERECTOMY    . ADNOIDS    . ANTERIOR CERVICAL DECOMP/DISCECTOMY FUSION N/A 10/27/2013   Procedure: ACDF/ANTERIOR CERVICAL DECOMPRESSION/DISCECTOMY FUSION  C5-C7  (2 LEVELS);  Surgeon: Melina Schools, MD;  Location: [place];  Service: Orthopedics;  Laterality: N/A;  . COLONOSCOPY  05/26/2020  . EYE SURGERY Bilateral    cataract removal  . KNEE ARTHROSCOPY Right   . TOTAL KNEE ARTHROPLASTY Right 06/30/2020   Procedure: TOTAL KNEE ARTHROPLASTY;  Surgeon: Netta Cedars, MD;  [facility];  Service: Orthopedics;  Laterality:  Right;  . UPPER GASTROINTESTINAL ENDOSCOPY  05/26/2020    There were no vitals filed for this visit.   Subjective Assessment - 07/10/20 1113    Subjective COVID-19 screening performed upon arrival. Patient reported 3-4/10 in right knee. States she exercises in the early mornings due to inability to sleep.    Pertinent History right TKA 06/30/2020; Fibromyalgia, migranes, anterior cervical decompression fusio n5/2015    Limitations Sitting;Standing;Walking;House hold activities    Diagnostic tests x-ray: inflammation    Patient Stated Goals get back to normal    Currently in Pain? Yes    Pain Score 4     Pain Location Knee    Pain Orientation Right    Pain Descriptors / Indicators Sore    Pain Type Surgical pain    Pain Onset 1 to 4 weeks ago    Pain Frequency Constant              [facility] PT Assessment - 07/10/20 0001      Assessment   Medical Diagnosis Pain in right knee    Referring Provider (PT) Shelle Iron, PA-C    Onset Date/Surgical Date 06/30/20    Next MD Visit 07/13/2020    Prior Therapy yes      Precautions   Precautions Other (comment)    Precaution Comments no ultrasound  Restrictions   Weight Bearing Restrictions No                         [facility] Adult PT Treatment/Exercise - 07/10/20 0001      Exercises   Exercises Knee/Hip      Knee/Hip Exercises: Aerobic   Nustep Level 1 x15 mins moving from seat 11 to 9      Knee/Hip Exercises: Standing   Heel Raises Both;2 sets;10 reps    Heel Raises Limitations toe raises 2x10      Modalities   Modalities Vasopneumatic      Vasopneumatic   Number Minutes Vasopneumatic  15 minutes    Vasopnuematic Location  Knee    Vasopneumatic Pressure Low    Vasopneumatic Temperature  34      Manual Therapy   Manual Therapy Passive ROM    Passive ROM PROM into right knee flexion and extension with gentle holds and frequent oscillations to decrease pain and muscle guarding                     PT Short Term Goals - 02/15/20 1308      PT SHORT TERM GOAL #1   Title STG=LTG             PT Long Term Goals - 07/03/20 1023      PT LONG TERM GOAL #1   Title Patient will be independent with HEP    Time 4    Period Weeks    Status New      PT LONG TERM GOAL #2   Title Patient will demonstrate 5 degrees or less of right knee extension AROM to improve gait mechanics.    Time 4    Period Weeks    Status New      PT LONG TERM GOAL #3   Title Patient will demonstrate 115+ degrees of right knee flexion AROM to improve functional tasks.    Time 4    Period Weeks    Status New      PT LONG TERM GOAL #4   Title Patient will demonstrate 4/5 or greater right knee MMT in all planes to improve stability during functional tasks.    Time 4    Period Weeks    Status New      PT LONG TERM GOAL #5   Title Patient will report ability to perform ADLs, home activities, and work activites with right knee pain less than or equal to 3/10.    Time 4    Period Weeks    Status New                 Plan - 07/10/20 0946    Clinical Impression Statement Patient arrived with mild pain in R knee. Patient guided through standing TEs but with pain requiring rest breaks. AAROM provided for LAQ for terminal knee extension with good response. Patient was experiencing muscle cramping during PROM therefore terminated for vasopneumatic device. No adverse affects upon removal of modalities.    Personal Factors and Comorbidities Age;Time since onset of injury/illness/exacerbation    Examination-Activity Limitations Locomotion Level;Transfers;Stairs;Stand    Examination-Participation Restrictions Cleaning;Occupation    Stability/Clinical Decision Making Stable/Uncomplicated    Clinical Decision Making Low    Rehab Potential Good    PT Frequency 3x / week    PT Duration 4 weeks    PT Treatment/Interventions ADLs/Self Care Home Management;Iontophoresis 4mg /ml  Dexamethasone;Gait training;Stair training;Functional mobility  training;Therapeutic activities;Electrical Stimulation;Therapeutic exercise;Balance training;Neuromuscular re-education;Manual techniques;Passive range of motion;Patient/family education;Vasopneumatic Device;Moist Heat    PT Next Visit Plan Nustep, R knee ROM in stting, standing and supine, PROM to improve range, modalities PRN for pain relief.    PT Home Exercise Plan continue HEP provided by surgeon    Consulted and Agree with Plan of Care Patient           Patient will benefit from skilled therapeutic intervention in order to improve the following deficits and impairments:  Difficulty walking,Decreased balance,Decreased activity tolerance,Decreased strength,Increased edema,Pain,Decreased range of motion,Abnormal gait  Visit Diagnosis: Acute pain of right knee  Muscle weakness (generalized)  Difficulty in walking, not elsewhere classified  Localized edema     Problem List Patient Active Problem List   Diagnosis Date Noted  . Rectal bleeding 05/10/2020  . Pain management contract agreement 05/16/2016  . Insomnia 04/13/2015  . BMI 26.0-26.9,adult 04/13/2015  . Chronic nausea 10/12/2014  . GAD (generalized anxiety disorder) 05/25/2014  . Hyperlipidemia 11/04/2012  . Migraines 11/04/2012  . GERD (gastroesophageal reflux disease) 11/04/2012  . Fibromyalgia 11/04/2012  . TMJ arthropathy 11/04/2012    Gabriela Eves, PT, DPT 07/10/2020, 11:23 AM  [facility] [address] [place], [place], [address] Phone: [phone]   Fax:  [phone]  Name: [name] MRN: [id number] Date of Birth: December 15, 1960

## 2020-07-12 ENCOUNTER — Other Ambulatory Visit: Payer: Self-pay

## 2020-07-12 ENCOUNTER — Ambulatory Visit: Payer: 59 | Attending: Physician Assistant | Admitting: Physical Therapy

## 2020-07-12 DIAGNOSIS — R600 Localized edema: Secondary | ICD-10-CM | POA: Diagnosis present

## 2020-07-12 DIAGNOSIS — M6281 Muscle weakness (generalized): Secondary | ICD-10-CM | POA: Diagnosis present

## 2020-07-12 DIAGNOSIS — R262 Difficulty in walking, not elsewhere classified: Secondary | ICD-10-CM | POA: Diagnosis present

## 2020-07-12 DIAGNOSIS — M25561 Pain in right knee: Secondary | ICD-10-CM | POA: Diagnosis not present

## 2020-07-12 NOTE — Therapy (Signed)
[facility] [place], [place], [address] Phone: [phone]   Fax:  [phone]  Physical Therapy Treatment  Patient Details  Name: [name] MRN: [id number] Date of Birth: 12/31/1960 Referring Provider (PT): Shelle Iron, [place]   Encounter Date: 07/12/2020   PT End of Session - 07/12/20 0917    Visit Number 5    Number of Visits 12    Date for PT Re-Evaluation 08/07/20    Authorization Type FOTO; Progress note every 10th visit    PT Start Time 0900    PT Stop Time 0958    PT Time Calculation (min) 58 min    Equipment Utilized During Treatment --   rollator   Activity Tolerance Patient limited by pain    Behavior During Therapy [facility] for tasks assessed/performed           Past Medical History:  Diagnosis Date  . Arthritis    right hand  . Dyslipidemia   . Family history of anesthesia complication    patients mother has severe nausea and vomiting after  . Fibromyalgia   . GERD (gastroesophageal reflux disease)   . Guillain-Barre syndrome ([place])   . Hiatal hernia   . History of kidney stones   . Kidney stones   . Migraines    one/month  . PONV (postoperative nausea and vomiting)   . TMJ (dislocation of temporomandibular joint)   . UTI (urinary tract infection)    hx of  . Vitamin D deficiency     Past Surgical History:  Procedure Laterality Date  . ABDOMINAL HYSTERECTOMY    . ADNOIDS    . ANTERIOR CERVICAL DECOMP/DISCECTOMY FUSION N/A 10/27/2013   Procedure: ACDF/ANTERIOR CERVICAL DECOMPRESSION/DISCECTOMY FUSION  C5-C7  (2 LEVELS);  Surgeon: Melina Schools, MD;  Location: [place];  Service: Orthopedics;  Laterality: N/A;  . COLONOSCOPY  05/26/2020  . EYE SURGERY Bilateral    cataract removal  . KNEE ARTHROSCOPY Right   . TOTAL KNEE ARTHROPLASTY Right 06/30/2020   Procedure: TOTAL KNEE ARTHROPLASTY;  Surgeon: Netta Cedars, MD;  [facility];  Service: Orthopedics;  Laterality: Right;  . UPPER  GASTROINTESTINAL ENDOSCOPY  05/26/2020    There were no vitals filed for this visit.   Subjective Assessment - 07/12/20 0932    Subjective COVID-19 screening performed upon arrival. Patient arrives with 4/10 right knee pain. States fibromyalgia may be cause of the inability to wear compression stockings secondary to hypersensitivity.    Pertinent History right TKA 06/30/2020; Fibromyalgia, migranes, anterior cervical decompression fusio n5/2015    Limitations Sitting;Standing;Walking;House hold activities    Diagnostic tests x-ray: inflammation    Patient Stated Goals get back to normal    Currently in Pain? Yes    Pain Score 4     Pain Location Knee    Pain Orientation Right    Pain Descriptors / Indicators Sore    Pain Type Surgical pain    Pain Onset 1 to 4 weeks ago    Pain Frequency Constant              [facility] PT Assessment - 07/12/20 0001      Assessment   Medical Diagnosis Pain in right knee    Referring Provider (PT) Shelle Iron, PA-C    Onset Date/Surgical Date 06/30/20    Next MD Visit 07/13/2020    Prior Therapy yes      Precautions   Precautions Other (comment)    Precaution Comments no ultrasound  Restrictions   Weight Bearing Restrictions No      AROM   Right Knee Flexion 85                         [facility] Adult PT Treatment/Exercise - 07/12/20 0001      Knee/Hip Exercises: Stretches   Passive Hamstring Stretch Right;3 reps;10 seconds   seated     Knee/Hip Exercises: Aerobic   Nustep Level 1 x15 mins moving from seat 11 to 9      Knee/Hip Exercises: Seated   Other Seated Knee/Hip Exercises seated quad set with heel on floor x10      Knee/Hip Exercises: Supine   Short Arc Quad Sets Strengthening;Right;Other (comment)    Short Arc Quad Sets Limitations NMES 10/10, 280 usec, 50 pps x7 mins (terminated early secondary to pain)      Modalities   Modalities Vasopneumatic;Electrical Stimulation      Electrical Stimulation    Electrical Stimulation Location right knee    Electrical Stimulation Action IFC    Electrical Stimulation Parameters 80-150 hz x10 mins    Electrical Stimulation Goals Pain;Edema      Vasopneumatic   Number Minutes Vasopneumatic  10 minutes    Vasopnuematic Location  Knee    Vasopneumatic Pressure Low    Vasopneumatic Temperature  34      Manual Therapy   Manual Therapy Passive ROM    Passive ROM PROM into right knee flexion and extension with gentle holds and oscillations for pain relief                    PT Short Term Goals - 02/15/20 1308      PT SHORT TERM GOAL #1   Title STG=LTG             PT Long Term Goals - 07/12/20 1055      PT LONG TERM GOAL #1   Title Patient will be independent with HEP    Time 4    Period Weeks    Status On-going      PT LONG TERM GOAL #2   Title Patient will demonstrate 5 degrees or less of right knee extension AROM to improve gait mechanics.    Time 4    Period Weeks    Status On-going      PT LONG TERM GOAL #3   Title Patient will demonstrate 115+ degrees of right knee flexion AROM to improve functional tasks.    Time 4    Period Weeks    Status On-going      PT LONG TERM GOAL #4   Title Patient will demonstrate 4/5 or greater right knee MMT in all planes to improve stability during functional tasks.    Time 4    Period Weeks    Status On-going      PT LONG TERM GOAL #5   Title Patient will report ability to perform ADLs, home activities, and work activites with right knee pain less than or equal to 3/10.    Time 4    Period Weeks    Status On-going                 Plan - 07/12/20 0934    Clinical Impression Statement Patient arrives with ongoing cramping in right hamstring. Patient guided through TEs but with pain and cramping in R hamstring and calf. Patient reports a decrease in pain with oscillations. NMES performed with SAQ  for muscle re-education with fair response. Flexion ROM improved though with  pain at end ranges. Patient educated on imporance of knee extension for adequate gait mechanics and to perform knee extension exercies in various postions to improve range. Patient reported understanding. No adverse affects upon removal.    Personal Factors and Comorbidities Age;Time since onset of injury/illness/exacerbation    Examination-Activity Limitations Locomotion Level;Transfers;Stairs;Stand    Examination-Participation Restrictions Cleaning;Occupation    Stability/Clinical Decision Making Stable/Uncomplicated    Clinical Decision Making Low    Rehab Potential Good    PT Frequency 3x / week    PT Duration 4 weeks    PT Treatment/Interventions ADLs/Self Care Home Management;Iontophoresis 4mg /ml Dexamethasone;Gait training;Stair training;Functional mobility training;Therapeutic activities;Electrical Stimulation;Therapeutic exercise;Balance training;Neuromuscular re-education;Manual techniques;Passive range of motion;Patient/family education;Vasopneumatic Device;Moist Heat    PT Next Visit Plan Nustep, R knee ROM in stting, standing and supine, PROM to improve range, modalities PRN for pain relief.    PT Home Exercise Plan emphasize more extension exercises.    Consulted and Agree with Plan of Care Patient           Patient will benefit from skilled therapeutic intervention in order to improve the following deficits and impairments:  Difficulty walking,Decreased balance,Decreased activity tolerance,Decreased strength,Increased edema,Pain,Decreased range of motion,Abnormal gait  Visit Diagnosis: Acute pain of right knee  Muscle weakness (generalized)  Difficulty in walking, not elsewhere classified  Localized edema     Problem List Patient Active Problem List   Diagnosis Date Noted  . Rectal bleeding 05/10/2020  . Pain management contract agreement 05/16/2016  . Insomnia 04/13/2015  . BMI 26.0-26.9,adult 04/13/2015  . Chronic nausea 10/12/2014  . GAD (generalized anxiety  disorder) 05/25/2014  . Hyperlipidemia 11/04/2012  . Migraines 11/04/2012  . GERD (gastroesophageal reflux disease) 11/04/2012  . Fibromyalgia 11/04/2012  . TMJ arthropathy 11/04/2012    Gabriela Eves, PT, DPT 07/12/2020, 11:02 AM  [facility] [address] [place], [place], [address] Phone: [phone]   Fax:  [phone]  Name: [name] MRN: [id number] Date of Birth: 02-12-1961

## 2020-07-14 ENCOUNTER — Encounter: Payer: Self-pay | Admitting: Physical Therapy

## 2020-07-14 ENCOUNTER — Other Ambulatory Visit: Payer: Self-pay

## 2020-07-14 ENCOUNTER — Ambulatory Visit: Payer: 59 | Admitting: Physical Therapy

## 2020-07-14 DIAGNOSIS — M25561 Pain in right knee: Secondary | ICD-10-CM

## 2020-07-14 DIAGNOSIS — R600 Localized edema: Secondary | ICD-10-CM

## 2020-07-14 DIAGNOSIS — R262 Difficulty in walking, not elsewhere classified: Secondary | ICD-10-CM

## 2020-07-14 DIAGNOSIS — M6281 Muscle weakness (generalized): Secondary | ICD-10-CM

## 2020-07-14 NOTE — Therapy (Signed)
[facility] [place], [place], [address] Phone: [phone]   Fax:  [phone]  Physical Therapy Treatment  Patient Details  Name: [name] MRN: [id number] Date of Birth: 02-Oct-1960 Referring Provider (PT): Shelle Iron, [place]   Encounter Date: 07/14/2020   PT End of Session - 07/14/20 0953    Visit Number 6    Number of Visits 12    Date for PT Re-Evaluation 08/07/20    Authorization Type FOTO; Progress note every 10th visit    PT Start Time 0900    PT Stop Time 0952    PT Time Calculation (min) 52 min    Activity Tolerance Patient tolerated treatment well    Behavior During Therapy [facility] for tasks assessed/performed           Past Medical History:  Diagnosis Date  . Arthritis    right hand  . Dyslipidemia   . Family history of anesthesia complication    patients mother has severe nausea and vomiting after  . Fibromyalgia   . GERD (gastroesophageal reflux disease)   . Guillain-Barre syndrome ([place])   . Hiatal hernia   . History of kidney stones   . Kidney stones   . Migraines    one/month  . PONV (postoperative nausea and vomiting)   . TMJ (dislocation of temporomandibular joint)   . UTI (urinary tract infection)    hx of  . Vitamin D deficiency     Past Surgical History:  Procedure Laterality Date  . ABDOMINAL HYSTERECTOMY    . ADNOIDS    . ANTERIOR CERVICAL DECOMP/DISCECTOMY FUSION N/A 10/27/2013   Procedure: ACDF/ANTERIOR CERVICAL DECOMPRESSION/DISCECTOMY FUSION  C5-C7  (2 LEVELS);  Surgeon: Melina Schools, MD;  Location: [place];  Service: Orthopedics;  Laterality: N/A;  . COLONOSCOPY  05/26/2020  . EYE SURGERY Bilateral    cataract removal  . KNEE ARTHROSCOPY Right   . TOTAL KNEE ARTHROPLASTY Right 06/30/2020   Procedure: TOTAL KNEE ARTHROPLASTY;  Surgeon: Netta Cedars, MD;  [facility];  Service: Orthopedics;  Laterality: Right;  . UPPER GASTROINTESTINAL ENDOSCOPY  05/26/2020    There  were no vitals filed for this visit.   Subjective Assessment - 07/14/20 0928    Subjective COVID-19 screening performed upon arrival. Patient arrives with improvements with nerve pain. MD appointment went fairly well; will return for a follow up on 08/10/2020.    Pertinent History right TKA 06/30/2020; Fibromyalgia, migranes, anterior cervical decompression fusio n5/2015    Limitations Sitting;Standing;Walking;House hold activities    Diagnostic tests x-ray: inflammation    Patient Stated Goals get back to normal    Currently in Pain? Yes    Pain Location Knee    Pain Orientation Right    Pain Descriptors / Indicators Sore    Pain Type Surgical pain    Pain Onset 1 to 4 weeks ago    Pain Frequency Constant              [facility] PT Assessment - 07/14/20 0001      Assessment   Medical Diagnosis Pain in right knee    Referring Provider (PT) Shelle Iron, PA-C    Onset Date/Surgical Date 06/30/20    Next MD Visit 08/10/2020    Prior Therapy yes      Precautions   Precautions Other (comment)    Precaution Comments no ultrasound      Restrictions   Weight Bearing Restrictions No  [facility] Adult PT Treatment/Exercise - 07/14/20 0001      Exercises   Exercises Knee/Hip      Knee/Hip Exercises: Stretches   Passive Hamstring Stretch Right;2 reps;30 seconds    Passive Hamstring Stretch Limitations with strap      Knee/Hip Exercises: Aerobic   Nustep Level 3 x15 mins moving from seat 10 to 8      Knee/Hip Exercises: Standing   Rocker Board 3 minutes      Modalities   Modalities Vasopneumatic;Electrical Stimulation      Electrical Stimulation   Electrical Stimulation Location right knee    Electrical Stimulation Action IFC    Electrical Stimulation Parameters 80-150 hz x10 mins    Electrical Stimulation Goals Pain;Edema      Vasopneumatic   Number Minutes Vasopneumatic  10 minutes    Vasopnuematic Location  Knee    Vasopneumatic Pressure  Low    Vasopneumatic Temperature  34      Manual Therapy   Manual Therapy Passive ROM    Passive ROM PROM into right knee extension with gentle holds and oscillations for pain relief in supine                    PT Short Term Goals - 02/15/20 1308      PT SHORT TERM GOAL #1   Title STG=LTG             PT Long Term Goals - 07/12/20 1055      PT LONG TERM GOAL #1   Title Patient will be independent with HEP    Time 4    Period Weeks    Status On-going      PT LONG TERM GOAL #2   Title Patient will demonstrate 5 degrees or less of right knee extension AROM to improve gait mechanics.    Time 4    Period Weeks    Status On-going      PT LONG TERM GOAL #3   Title Patient will demonstrate 115+ degrees of right knee flexion AROM to improve functional tasks.    Time 4    Period Weeks    Status On-going      PT LONG TERM GOAL #4   Title Patient will demonstrate 4/5 or greater right knee MMT in all planes to improve stability during functional tasks.    Time 4    Period Weeks    Status On-going      PT LONG TERM GOAL #5   Title Patient will report ability to perform ADLs, home activities, and work activites with right knee pain less than or equal to 3/10.    Time 4    Period Weeks    Status On-going                 Plan - 07/14/20 0953    Clinical Impression Statement Patient was able to tolerat treatment fairly well with less reports of pain in R calf. Patient guided through standing TEs and stretching with fair response. Nerve pain has improved significantly since taking her new medication. PROM provided with good response though with pain. Patient inquired when she can return to her home as she has been staying with her daughter. Patient educated to talk and discusss with her daughter this weekend about it and to make sure she is confident and comfortable with doing basic ADLs and home activities independendently. No adverse affects upon the removal of  modalties.    Personal Factors  and Comorbidities Age;Time since onset of injury/illness/exacerbation    Examination-Activity Limitations Locomotion Level;Transfers;Stairs;Stand    Examination-Participation Restrictions Cleaning;Occupation    Stability/Clinical Decision Making Stable/Uncomplicated    Clinical Decision Making Low    Rehab Potential Good    PT Frequency 3x / week    PT Duration 4 weeks    PT Treatment/Interventions ADLs/Self Care Home Management;Iontophoresis 4mg /ml Dexamethasone;Gait training;Stair training;Functional mobility training;Therapeutic activities;Electrical Stimulation;Therapeutic exercise;Balance training;Neuromuscular re-education;Manual techniques;Passive range of motion;Patient/family education;Vasopneumatic Device;Moist Heat    PT Next Visit Plan Nustep, R knee ROM in stting, standing and supine, PROM to improve range, modalities PRN for pain relief.    PT Home Exercise Plan emphasize more extension exercises.    Consulted and Agree with Plan of Care Patient    Family Member Consulted Daughter           Patient will benefit from skilled therapeutic intervention in order to improve the following deficits and impairments:  Difficulty walking,Decreased balance,Decreased activity tolerance,Decreased strength,Increased edema,Pain,Decreased range of motion,Abnormal gait  Visit Diagnosis: Acute pain of right knee  Muscle weakness (generalized)  Difficulty in walking, not elsewhere classified  Localized edema     Problem List Patient Active Problem List   Diagnosis Date Noted  . Rectal bleeding 05/10/2020  . Pain management contract agreement 05/16/2016  . Insomnia 04/13/2015  . BMI 26.0-26.9,adult 04/13/2015  . Chronic nausea 10/12/2014  . GAD (generalized anxiety disorder) 05/25/2014  . Hyperlipidemia 11/04/2012  . Migraines 11/04/2012  . GERD (gastroesophageal reflux disease) 11/04/2012  . Fibromyalgia 11/04/2012  . TMJ arthropathy 11/04/2012     Gabriela Eves, PT, DPT 07/14/2020, 10:39 AM  [facility] [address] [place], [place], [address] Phone: [phone]   Fax:  [phone]  Name: GERALDYNE HUNSINGER MRN: [id number] Date of Birth: 26-Dec-1960

## 2020-07-17 ENCOUNTER — Encounter: Payer: 59 | Admitting: Physical Therapy

## 2020-07-19 ENCOUNTER — Other Ambulatory Visit: Payer: Self-pay

## 2020-07-19 ENCOUNTER — Ambulatory Visit: Payer: 59 | Admitting: Physical Therapy

## 2020-07-19 DIAGNOSIS — M25561 Pain in right knee: Secondary | ICD-10-CM

## 2020-07-19 DIAGNOSIS — M6281 Muscle weakness (generalized): Secondary | ICD-10-CM

## 2020-07-19 DIAGNOSIS — R262 Difficulty in walking, not elsewhere classified: Secondary | ICD-10-CM

## 2020-07-19 DIAGNOSIS — R600 Localized edema: Secondary | ICD-10-CM

## 2020-07-19 NOTE — Therapy (Signed)
[facility] [place], [place], [address] Phone: [phone]   Fax:  [phone]  Physical Therapy Treatment  Patient Details  Name: SHANIELLE CORRELL MRN: [id number] Date of Birth: 1961/01/07 Referring Provider (PT): Shelle Iron, [place]   Encounter Date: 07/19/2020   PT End of Session - 07/19/20 0936    Visit Number 7    Number of Visits 12    Date for PT Re-Evaluation 08/07/20    Authorization Type FOTO; Progress note every 10th visit    PT Start Time 0900    PT Stop Time 0945    PT Time Calculation (min) 45 min    Activity Tolerance Patient tolerated treatment well    Behavior During Therapy [facility] for tasks assessed/performed           Past Medical History:  Diagnosis Date  . Arthritis    right hand  . Dyslipidemia   . Family history of anesthesia complication    patients mother has severe nausea and vomiting after  . Fibromyalgia   . GERD (gastroesophageal reflux disease)   . Guillain-Barre syndrome ([place])   . Hiatal hernia   . History of kidney stones   . Kidney stones   . Migraines    one/month  . PONV (postoperative nausea and vomiting)   . TMJ (dislocation of temporomandibular joint)   . UTI (urinary tract infection)    hx of  . Vitamin D deficiency     Past Surgical History:  Procedure Laterality Date  . ABDOMINAL HYSTERECTOMY    . ADNOIDS    . ANTERIOR CERVICAL DECOMP/DISCECTOMY FUSION N/A 10/27/2013   Procedure: ACDF/ANTERIOR CERVICAL DECOMPRESSION/DISCECTOMY FUSION  C5-C7  (2 LEVELS);  Surgeon: Melina Schools, MD;  Location: [place];  Service: Orthopedics;  Laterality: N/A;  . COLONOSCOPY  05/26/2020  . EYE SURGERY Bilateral    cataract removal  . KNEE ARTHROSCOPY Right   . TOTAL KNEE ARTHROPLASTY Right 06/30/2020   Procedure: TOTAL KNEE ARTHROPLASTY;  Surgeon: Netta Cedars, MD;  [facility];  Service: Orthopedics;  Laterality: Right;  . UPPER GASTROINTESTINAL ENDOSCOPY  05/26/2020    There  were no vitals filed for this visit.   Subjective Assessment - 07/19/20 1223    Subjective COVID-19 screening performed upon arrival. Patient arrives with 3/10 pain in right leg.    Pertinent History right TKA 06/30/2020; Fibromyalgia, migranes, anterior cervical decompression fusio n5/2015    Limitations Sitting;Standing;Walking;House hold activities    Diagnostic tests x-ray: inflammation    Patient Stated Goals get back to normal    Currently in Pain? Yes    Pain Score 3     Pain Location Knee    Pain Orientation Right    Pain Descriptors / Indicators Sore    Pain Type Surgical pain    Pain Onset 1 to 4 weeks ago    Pain Frequency Constant              [facility] PT Assessment - 07/19/20 0001      Assessment   Medical Diagnosis Pain in right knee    Referring Provider (PT) Shelle Iron, PA-C    Onset Date/Surgical Date 06/30/20    Next MD Visit 08/10/2020    Prior Therapy yes      Precautions   Precautions Other (comment)    Precaution Comments no ultrasound                         [facility]  Adult PT Treatment/Exercise - 07/19/20 0001      Exercises   Exercises Knee/Hip      Knee/Hip Exercises: Stretches   Passive Hamstring Stretch Right   5" hold x10     Knee/Hip Exercises: Aerobic   Recumbent Bike AAROM seat 9 full forward and backward revolution    Nustep Level 3 x10 mins moving from seat 9 to 8      Knee/Hip Exercises: Standing   Forward Step Up Both;1 set;10 reps;Hand Hold: 2;Step Height: 6"      Modalities   Modalities Vasopneumatic;Electrical Stimulation      Vasopneumatic   Number Minutes Vasopneumatic  10 minutes    Vasopnuematic Location  Knee    Vasopneumatic Pressure Low    Vasopneumatic Temperature  34      Manual Therapy   Manual Therapy Passive ROM    Passive ROM PROM into right knee extension with gentle holds and oscillations for pain relief in supine                    PT Short Term Goals - 02/15/20 1308      PT  SHORT TERM GOAL #1   Title STG=LTG             PT Long Term Goals - 07/12/20 1055      PT LONG TERM GOAL #1   Title Patient will be independent with HEP    Time 4    Period Weeks    Status On-going      PT LONG TERM GOAL #2   Title Patient will demonstrate 5 degrees or less of right knee extension AROM to improve gait mechanics.    Time 4    Period Weeks    Status On-going      PT LONG TERM GOAL #3   Title Patient will demonstrate 115+ degrees of right knee flexion AROM to improve functional tasks.    Time 4    Period Weeks    Status On-going      PT LONG TERM GOAL #4   Title Patient will demonstrate 4/5 or greater right knee MMT in all planes to improve stability during functional tasks.    Time 4    Period Weeks    Status On-going      PT LONG TERM GOAL #5   Title Patient will report ability to perform ADLs, home activities, and work activites with right knee pain less than or equal to 3/10.    Time 4    Period Weeks    Status On-going                 Plan - 07/19/20 1208    Clinical Impression Statement Patient arrived doing fairly well with therapy session with progression to the bike. Patient was able to perform full revolutions at seat 9 but with pain and burning sensation. Patient improved with knee extension AROM, measured at 8 degrees from neutral. Normal response to modalities upon removal.    Personal Factors and Comorbidities Age;Time since onset of injury/illness/exacerbation    Examination-Activity Limitations Locomotion Level;Transfers;Stairs;Stand    Examination-Participation Restrictions Cleaning;Occupation    Stability/Clinical Decision Making Stable/Uncomplicated    Clinical Decision Making Low    Rehab Potential Good    PT Frequency 3x / week    PT Duration 4 weeks    PT Treatment/Interventions ADLs/Self Care Home Management;Iontophoresis 4mg /ml Dexamethasone;Gait training;Stair training;Functional mobility training;Therapeutic  activities;Electrical Stimulation;Therapeutic exercise;Balance training;Neuromuscular re-education;Manual techniques;Passive range of motion;Patient/family  education;Vasopneumatic Device;Moist Heat    PT Next Visit Plan Nustep, R knee ROM in stting, standing and supine, PROM to improve range, modalities PRN for pain relief.    PT Home Exercise Plan emphasize more extension exercises.    Consulted and Agree with Plan of Care Patient           Patient will benefit from skilled therapeutic intervention in order to improve the following deficits and impairments:  Difficulty walking,Decreased balance,Decreased activity tolerance,Decreased strength,Increased edema,Pain,Decreased range of motion,Abnormal gait  Visit Diagnosis: Acute pain of right knee  Muscle weakness (generalized)  Difficulty in walking, not elsewhere classified  Localized edema     Problem List Patient Active Problem List   Diagnosis Date Noted  . Rectal bleeding 05/10/2020  . Pain management contract agreement 05/16/2016  . Insomnia 04/13/2015  . BMI 26.0-26.9,adult 04/13/2015  . Chronic nausea 10/12/2014  . GAD (generalized anxiety disorder) 05/25/2014  . Hyperlipidemia 11/04/2012  . Migraines 11/04/2012  . GERD (gastroesophageal reflux disease) 11/04/2012  . Fibromyalgia 11/04/2012  . TMJ arthropathy 11/04/2012    Gabriela Eves, PT, DPT 07/19/2020, 12:26 PM  [facility] [address] [place], [place], [address] Phone: [phone]   Fax:  [phone]  Name: TATE JERKINS MRN: [id number] Date of Birth: 04/24/1961

## 2020-07-21 ENCOUNTER — Encounter: Payer: Self-pay | Admitting: Physical Therapy

## 2020-07-21 ENCOUNTER — Other Ambulatory Visit: Payer: Self-pay

## 2020-07-21 ENCOUNTER — Ambulatory Visit: Payer: 59 | Admitting: Physical Therapy

## 2020-07-21 DIAGNOSIS — R600 Localized edema: Secondary | ICD-10-CM

## 2020-07-21 DIAGNOSIS — R262 Difficulty in walking, not elsewhere classified: Secondary | ICD-10-CM

## 2020-07-21 DIAGNOSIS — M6281 Muscle weakness (generalized): Secondary | ICD-10-CM

## 2020-07-21 DIAGNOSIS — M25561 Pain in right knee: Secondary | ICD-10-CM

## 2020-07-21 NOTE — Therapy (Signed)
[facility] [place], [place], [address] Phone: [phone]   Fax:  [phone]  Physical Therapy Treatment  Patient Details  Name: [name] MRN: [id number] Date of Birth: Apr 24, 1961 Referring Provider (PT): Shelle Iron, [place]   Encounter Date: 07/21/2020   PT End of Session - 07/21/20 1114    Visit Number 8    Number of Visits 12    Date for PT Re-Evaluation 08/07/20    Authorization Type FOTO; Progress note every 10th visit    PT Start Time 0900    PT Stop Time 0948    PT Time Calculation (min) 48 min    Activity Tolerance Patient tolerated treatment well    Behavior During Therapy [facility] for tasks assessed/performed           Past Medical History:  Diagnosis Date  . Arthritis    right hand  . Dyslipidemia   . Family history of anesthesia complication    patients mother has severe nausea and vomiting after  . Fibromyalgia   . GERD (gastroesophageal reflux disease)   . Guillain-Barre syndrome ([place])   . Hiatal hernia   . History of kidney stones   . Kidney stones   . Migraines    one/month  . PONV (postoperative nausea and vomiting)   . TMJ (dislocation of temporomandibular joint)   . UTI (urinary tract infection)    hx of  . Vitamin D deficiency     Past Surgical History:  Procedure Laterality Date  . ABDOMINAL HYSTERECTOMY    . ADNOIDS    . ANTERIOR CERVICAL DECOMP/DISCECTOMY FUSION N/A 10/27/2013   Procedure: ACDF/ANTERIOR CERVICAL DECOMPRESSION/DISCECTOMY FUSION  C5-C7  (2 LEVELS);  Surgeon: Melina Schools, MD;  Location: [place];  Service: Orthopedics;  Laterality: N/A;  . COLONOSCOPY  05/26/2020  . EYE SURGERY Bilateral    cataract removal  . KNEE ARTHROSCOPY Right   . TOTAL KNEE ARTHROPLASTY Right 06/30/2020   Procedure: TOTAL KNEE ARTHROPLASTY;  Surgeon: Netta Cedars, MD;  [facility];  Service: Orthopedics;  Laterality: Right;  . UPPER GASTROINTESTINAL ENDOSCOPY  05/26/2020    There  were no vitals filed for this visit.   Subjective Assessment - 07/21/20 1110    Subjective COVID-19 screening performed upon arrival. Patient arrives ambulating without an AD with more confidence but still with slight hesitation.    Pertinent History right TKA 06/30/2020; Fibromyalgia, migranes, anterior cervical decompression fusio n5/2015    Limitations Sitting;Standing;Walking;House hold activities    Diagnostic tests x-ray: inflammation    Patient Stated Goals get back to normal    Currently in Pain? Yes    Pain Location Knee    Pain Orientation Right    Pain Descriptors / Indicators Sore    Pain Type Surgical pain    Pain Onset 1 to 4 weeks ago    Pain Frequency Constant              [facility] PT Assessment - 07/21/20 0001      Assessment   Medical Diagnosis Pain in right knee    Referring Provider (PT) Shelle Iron, PA-C    Onset Date/Surgical Date 06/30/20    Next MD Visit 08/10/2020    Prior Therapy yes      Precautions   Precautions Other (comment)    Precaution Comments no ultrasound      AROM   Right Knee Extension 6    Right Knee Flexion 114      PROM  Right Knee Flexion 118                         [facility] Adult PT Treatment/Exercise - 07/21/20 0001      Exercises   Exercises Knee/Hip      Knee/Hip Exercises: Stretches   Hip Flexor Stretch Right;30 seconds    Hip Flexor Stretch Limitations off bed    Knee: Self-Stretch to increase Flexion Right;Other (comment)   10" x10     Knee/Hip Exercises: Aerobic   Recumbent Bike AAROM seat 6 full forward and backward revolution    Nustep Level 3 x10 mins moving from seat 8 to 6      Knee/Hip Exercises: Standing   Hip Abduction AROM;Both;1 set;10 reps;Knee straight    Hip Extension AROM;Both;15 reps;Knee straight      Modalities   Modalities Vasopneumatic      Vasopneumatic   Number Minutes Vasopneumatic  10 minutes    Vasopnuematic Location  Knee    Vasopneumatic Pressure Low    Vasopneumatic  Temperature  34      Manual Therapy   Manual Therapy Passive ROM    Passive ROM PROM into right knee extension and flexion with gentle holds and oscillations for pain relief in supine                    PT Short Term Goals - 02/15/20 1308      PT SHORT TERM GOAL #1   Title STG=LTG             PT Long Term Goals - 07/12/20 1055      PT LONG TERM GOAL #1   Title Patient will be independent with HEP    Time 4    Period Weeks    Status On-going      PT LONG TERM GOAL #2   Title Patient will demonstrate 5 degrees or less of right knee extension AROM to improve gait mechanics.    Time 4    Period Weeks    Status On-going      PT LONG TERM GOAL #3   Title Patient will demonstrate 115+ degrees of right knee flexion AROM to improve functional tasks.    Time 4    Period Weeks    Status On-going      PT LONG TERM GOAL #4   Title Patient will demonstrate 4/5 or greater right knee MMT in all planes to improve stability during functional tasks.    Time 4    Period Weeks    Status On-going      PT LONG TERM GOAL #5   Title Patient will report ability to perform ADLs, home activities, and work activites with right knee pain less than or equal to 3/10.    Time 4    Period Weeks    Status On-going                 Plan - 07/21/20 1111    Clinical Impression Statement Patient responded well to therapy session with excellent progression with ROM. Patient reported knee "breaking loose" with full revolutions on the bike. Hip flexor stretch performed off bed with excellent response. Patient was instructed on how to perform stretch at home. Right knee AROM measured at 6-114 degrees. No adverse affects upon completion of modalties.    Personal Factors and Comorbidities Age;Time since onset of injury/illness/exacerbation    Examination-Activity Limitations Locomotion Level;Transfers;Stairs;Stand    Examination-Participation Restrictions  Cleaning;Occupation     Stability/Clinical Decision Making Stable/Uncomplicated    Clinical Decision Making Low    Rehab Potential Good    PT Frequency 3x / week    PT Duration 4 weeks    PT Treatment/Interventions ADLs/Self Care Home Management;Iontophoresis 4mg /ml Dexamethasone;Gait training;Stair training;Functional mobility training;Therapeutic activities;Electrical Stimulation;Therapeutic exercise;Balance training;Neuromuscular re-education;Manual techniques;Passive range of motion;Patient/family education;Vasopneumatic Device;Moist Heat    PT Next Visit Plan Nustep, R knee ROM in stting, standing and supine, PROM to improve range, modalities PRN for pain relief.    PT Home Exercise Plan emphasize more extension exercises.    Consulted and Agree with Plan of Care Patient           Patient will benefit from skilled therapeutic intervention in order to improve the following deficits and impairments:  Difficulty walking,Decreased balance,Decreased activity tolerance,Decreased strength,Increased edema,Pain,Decreased range of motion,Abnormal gait  Visit Diagnosis: Acute pain of right knee  Muscle weakness (generalized)  Difficulty in walking, not elsewhere classified  Localized edema     Problem List Patient Active Problem List   Diagnosis Date Noted  . Rectal bleeding 05/10/2020  . Pain management contract agreement 05/16/2016  . Insomnia 04/13/2015  . BMI 26.0-26.9,adult 04/13/2015  . Chronic nausea 10/12/2014  . GAD (generalized anxiety disorder) 05/25/2014  . Hyperlipidemia 11/04/2012  . Migraines 11/04/2012  . GERD (gastroesophageal reflux disease) 11/04/2012  . Fibromyalgia 11/04/2012  . TMJ arthropathy 11/04/2012    Gabriela Eves, PT, DPT 07/21/2020, 11:16 AM  [facility] [address] [place], [place], [address] Phone: [phone]   Fax:  [phone]  Name: [name] MRN: [id number] Date of Birth: 02-Nov-1960

## 2020-07-24 ENCOUNTER — Ambulatory Visit: Payer: 59 | Admitting: Physical Therapy

## 2020-07-24 ENCOUNTER — Other Ambulatory Visit: Payer: Self-pay

## 2020-07-24 DIAGNOSIS — R262 Difficulty in walking, not elsewhere classified: Secondary | ICD-10-CM

## 2020-07-24 DIAGNOSIS — M25561 Pain in right knee: Secondary | ICD-10-CM | POA: Diagnosis not present

## 2020-07-24 DIAGNOSIS — M6281 Muscle weakness (generalized): Secondary | ICD-10-CM

## 2020-07-24 DIAGNOSIS — R600 Localized edema: Secondary | ICD-10-CM

## 2020-07-24 NOTE — Therapy (Signed)
[facility] [place], [place], [address] Phone: [phone]   Fax:  [phone]  Physical Therapy Treatment  Patient Details  Name: [name] MRN: [id number] Date of Birth: 1960/07/15 Referring Provider (PT): Shelle Iron, [place]   Encounter Date: 07/24/2020   PT End of Session - 07/24/20 1029    Visit Number 9    Number of Visits 12    Date for PT Re-Evaluation 08/07/20    Authorization Type FOTO; Progress note every 10th visit    PT Start Time 0900    PT Stop Time 0950    PT Time Calculation (min) 50 min    Activity Tolerance Patient tolerated treatment well    Behavior During Therapy [facility] for tasks assessed/performed           Past Medical History:  Diagnosis Date  . Arthritis    right hand  . Dyslipidemia   . Family history of anesthesia complication    patients mother has severe nausea and vomiting after  . Fibromyalgia   . GERD (gastroesophageal reflux disease)   . Guillain-Barre syndrome ([place])   . Hiatal hernia   . History of kidney stones   . Kidney stones   . Migraines    one/month  . PONV (postoperative nausea and vomiting)   . TMJ (dislocation of temporomandibular joint)   . UTI (urinary tract infection)    hx of  . Vitamin D deficiency     Past Surgical History:  Procedure Laterality Date  . ABDOMINAL HYSTERECTOMY    . ADNOIDS    . ANTERIOR CERVICAL DECOMP/DISCECTOMY FUSION N/A 10/27/2013   Procedure: ACDF/ANTERIOR CERVICAL DECOMPRESSION/DISCECTOMY FUSION  C5-C7  (2 LEVELS);  Surgeon: Melina Schools, MD;  Location: [facility];  Service: Orthopedics;  Laterality: N/A;  . COLONOSCOPY  05/26/2020  . EYE SURGERY Bilateral    cataract removal  . KNEE ARTHROSCOPY Right   . TOTAL KNEE ARTHROPLASTY Right 06/30/2020   Procedure: TOTAL KNEE ARTHROPLASTY;  Surgeon: Netta Cedars, MD;  Location[facility];  Service: Orthopedics;  Laterality: Right;  . UPPER GASTROINTESTINAL ENDOSCOPY  05/26/2020    There  were no vitals filed for this visit.   Subjective Assessment - 07/24/20 1029    Subjective COVID-19 screening performed upon arrival. Reports 8-9/10 in terms of soreness; 4/10 burning pain in right anterior knee.    Pertinent History right TKA 06/30/2020; Fibromyalgia, migranes, anterior cervical decompression fusion 10/2013    Limitations Sitting;Standing;Walking;House hold activities    Diagnostic tests x-ray: inflammation    Patient Stated Goals get back to normal    Currently in Pain? Yes    Pain Score 4     Pain Location Knee    Pain Orientation Right    Pain Descriptors / Indicators Burning    Pain Type Surgical pain    Pain Onset 1 to 4 weeks ago    Pain Frequency Constant              [facility] PT Assessment - 07/24/20 0001      Assessment   Medical Diagnosis Pain in right knee    Referring Provider (PT) Shelle Iron, PA-C    Onset Date/Surgical Date 06/30/20    Next MD Visit 08/10/2020    Prior Therapy yes      Precautions   Precautions Other (comment)    Precaution Comments no ultrasound  [facility] Adult PT Treatment/Exercise - 07/24/20 0001      Exercises   Exercises Knee/Hip      Knee/Hip Exercises: Aerobic   Recumbent Bike AAROM seat 5-4 full forward and backward revolution x7    Nustep Level 3 x10 mins moving from seat 8 to 6      Knee/Hip Exercises: Supine   Terminal Knee Extension Strengthening;Right;10 reps;Theraband    Theraband Level (Terminal Knee Extension) Level 2 (Red)    Terminal Knee Extension Limitations with LE in zero knee    Bridges AROM;10 reps    Straight Leg Raise with External Rotation AROM;Right;1 set;10 reps    Other Supine Knee/Hip Exercises clam shells red theraband x20      Knee/Hip Exercises: Prone   Prone Knee Hang 2 minutes      Modalities   Modalities Vasopneumatic      Vasopneumatic   Number Minutes Vasopneumatic  10 minutes    Vasopnuematic Location  Knee    Vasopneumatic Pressure Low     Vasopneumatic Temperature  34                    PT Short Term Goals - 02/15/20 1308      PT SHORT TERM GOAL #1   Title STG=LTG             PT Long Term Goals - 07/12/20 1055      PT LONG TERM GOAL #1   Title Patient will be independent with HEP    Time 4    Period Weeks    Status On-going      PT LONG TERM GOAL #2   Title Patient will demonstrate 5 degrees or less of right knee extension AROM to improve gait mechanics.    Time 4    Period Weeks    Status On-going      PT LONG TERM GOAL #3   Title Patient will demonstrate 115+ degrees of right knee flexion AROM to improve functional tasks.    Time 4    Period Weeks    Status On-going      PT LONG TERM GOAL #4   Title Patient will demonstrate 4/5 or greater right knee MMT in all planes to improve stability during functional tasks.    Time 4    Period Weeks    Status On-going      PT LONG TERM GOAL #5   Title Patient will report ability to perform ADLs, home activities, and work activites with right knee pain less than or equal to 3/10.    Time 4    Period Weeks    Status On-going                 Plan - 07/24/20 1041    Clinical Impression Statement Patient responded well to therapy session though with some reports of pain and cramping in adductor muscle. Patient provided with more supine TEs with good response and was provided with HEP and red theraband for home. Normal response to vasopneumatic device upon removal.    Personal Factors and Comorbidities Age;Time since onset of injury/illness/exacerbation    Examination-Activity Limitations Locomotion Level;Transfers;Stairs;Stand    Examination-Participation Restrictions Cleaning;Occupation    Stability/Clinical Decision Making Stable/Uncomplicated    Clinical Decision Making Low    Rehab Potential Good    PT Frequency 3x / week    PT Duration 4 weeks    PT Treatment/Interventions ADLs/Self Care Home Management;Iontophoresis 4mg /ml  Dexamethasone;Gait training;Stair training;Functional mobility training;Therapeutic  activities;Electrical Stimulation;Therapeutic exercise;Balance training;Neuromuscular re-education;Manual techniques;Passive range of motion;Patient/family education;Vasopneumatic Device;Moist Heat    PT Next Visit Plan Progress note and FOTO; Nustep, R knee ROM in stting, standing and supine, PROM to improve range, modalities PRN for pain relief.    PT Home Exercise Plan emphasize more extension exercises.    Consulted and Agree with Plan of Care Patient    Family Member Consulted Daughter           Patient will benefit from skilled therapeutic intervention in order to improve the following deficits and impairments:  Difficulty walking,Decreased balance,Decreased activity tolerance,Decreased strength,Increased edema,Pain,Decreased range of motion,Abnormal gait  Visit Diagnosis: Acute pain of right knee  Muscle weakness (generalized)  Difficulty in walking, not elsewhere classified  Localized edema     Problem List Patient Active Problem List   Diagnosis Date Noted  . Rectal bleeding 05/10/2020  . Pain management contract agreement 05/16/2016  . Insomnia 04/13/2015  . BMI 26.0-26.9,adult 04/13/2015  . Chronic nausea 10/12/2014  . GAD (generalized anxiety disorder) 05/25/2014  . Hyperlipidemia 11/04/2012  . Migraines 11/04/2012  . GERD (gastroesophageal reflux disease) 11/04/2012  . Fibromyalgia 11/04/2012  . TMJ arthropathy 11/04/2012    Gabriela Eves, PT, DPT 07/24/2020, 11:13 AM  [facility] [address] [place], [place], [address] Phone: [phone]   Fax:  [phone]  Name: [name] MRN: [id number] Date of Birth: Feb 28, 1961

## 2020-07-26 ENCOUNTER — Encounter: Payer: 59 | Admitting: Physical Therapy

## 2020-07-28 ENCOUNTER — Encounter: Payer: Self-pay | Admitting: Physical Therapy

## 2020-07-28 ENCOUNTER — Ambulatory Visit: Payer: 59 | Admitting: Physical Therapy

## 2020-07-28 ENCOUNTER — Other Ambulatory Visit: Payer: Self-pay

## 2020-07-28 DIAGNOSIS — M25561 Pain in right knee: Secondary | ICD-10-CM

## 2020-07-28 DIAGNOSIS — R262 Difficulty in walking, not elsewhere classified: Secondary | ICD-10-CM

## 2020-07-28 DIAGNOSIS — M6281 Muscle weakness (generalized): Secondary | ICD-10-CM

## 2020-07-28 DIAGNOSIS — R600 Localized edema: Secondary | ICD-10-CM

## 2020-07-28 NOTE — Therapy (Signed)
[facility] [place], [place], [address] Phone: [phone]   Fax:  [phone]  Physical Therapy Treatment Progress Note Reporting Period 07/03/2020 to 07/28/2020  See note below for Objective Data and Assessment of Progress/Goals. Patient is making improvements but had an exacerbation of pain since Monday. Goals ongoing at this time. Gabriela Eves, PT, DPT  Patient Details  Name: [name] MRN: [id number] Date of Birth: 12-Oct-1960 Referring Provider (PT): Shelle Iron, [place]   Encounter Date: 07/28/2020   PT End of Session - 07/28/20 0937    Visit Number 10    Number of Visits 12    Date for PT Re-Evaluation 08/07/20    Authorization Type FOTO; Progress note every 10th visit    PT Start Time 0900    PT Stop Time 0946    PT Time Calculation (min) 46 min    Activity Tolerance Patient limited by pain    Behavior During Therapy [facility] for tasks assessed/performed           Past Medical History:  Diagnosis Date  . Arthritis    right hand  . Dyslipidemia   . Family history of anesthesia complication    patients mother has severe nausea and vomiting after  . Fibromyalgia   . GERD (gastroesophageal reflux disease)   . Guillain-Barre syndrome ([place])   . Hiatal hernia   . History of kidney stones   . Kidney stones   . Migraines    one/month  . PONV (postoperative nausea and vomiting)   . TMJ (dislocation of temporomandibular joint)   . UTI (urinary tract infection)    hx of  . Vitamin D deficiency     Past Surgical History:  Procedure Laterality Date  . ABDOMINAL HYSTERECTOMY    . ADNOIDS    . ANTERIOR CERVICAL DECOMP/DISCECTOMY FUSION N/A 10/27/2013   Procedure: ACDF/ANTERIOR CERVICAL DECOMPRESSION/DISCECTOMY FUSION  C5-C7  (2 LEVELS);  Surgeon: Melina Schools, MD;  Location: [facility];  Service: Orthopedics;  Laterality: N/A;  . COLONOSCOPY  05/26/2020  . EYE SURGERY Bilateral    cataract removal  . KNEE  ARTHROSCOPY Right   . TOTAL KNEE ARTHROPLASTY Right 06/30/2020   Procedure: TOTAL KNEE ARTHROPLASTY;  Surgeon: Netta Cedars, MD;  [facility];  Service: Orthopedics;  Laterality: Right;  . UPPER GASTROINTESTINAL ENDOSCOPY  05/26/2020    There were no vitals filed for this visit.   Subjective Assessment - 07/28/20 0916    Subjective COVID-19 screening performed upon arrival. Reports going backwards. More throbbing pain about 8/10.    Pertinent History right TKA 06/30/2020; Fibromyalgia, migranes, anterior cervical decompression fusion 10/2013    Limitations Sitting;Standing;Walking;House hold activities    Diagnostic tests x-ray: inflammation    Patient Stated Goals get back to normal    Currently in Pain? Yes    Pain Score 8     Pain Location Knee    Pain Orientation Right    Pain Descriptors / Indicators Throbbing    Pain Type Surgical pain    Pain Onset 1 to 4 weeks ago    Pain Frequency Constant              [facility] PT Assessment - 07/28/20 0001      Assessment   Medical Diagnosis Pain in right knee    Referring Provider (PT) Shelle Iron, PA-C    Onset Date/Surgical Date 06/30/20    Next MD Visit 08/10/2020    Prior Therapy yes  Precautions   Precautions Other (comment)    Precaution Comments no ultrasound                         [facility] Adult PT Treatment/Exercise - 07/28/20 0001      Exercises   Exercises Knee/Hip      Knee/Hip Exercises: Aerobic   Recumbent Bike AAROM seat 8 full forward and backward revolution x8    Nustep Level 1 x10 mins moving from seat 10 to 8      Modalities   Modalities Vasopneumatic      Electrical Stimulation   Electrical Stimulation Location right knee    Electrical Stimulation Action IFC    Electrical Stimulation Parameters 80-150 hz x15 mins    Electrical Stimulation Goals Pain      Vasopneumatic   Number Minutes Vasopneumatic  15 minutes    Vasopnuematic Location  Knee    Vasopneumatic Pressure Low     Vasopneumatic Temperature  34      Manual Therapy   Manual Therapy Passive ROM;Edema management    Edema Management edema massage distal to proximal for right knee to decrease swelling    Passive ROM PROM into right knee extension and flexion with gentle holds and oscillations for pain relief in supine                    PT Short Term Goals - 02/15/20 1308      PT SHORT TERM GOAL #1   Title STG=LTG             PT Long Term Goals - 07/12/20 1055      PT LONG TERM GOAL #1   Title Patient will be independent with HEP    Time 4    Period Weeks    Status On-going      PT LONG TERM GOAL #2   Title Patient will demonstrate 5 degrees or less of right knee extension AROM to improve gait mechanics.    Time 4    Period Weeks    Status On-going      PT LONG TERM GOAL #3   Title Patient will demonstrate 115+ degrees of right knee flexion AROM to improve functional tasks.    Time 4    Period Weeks    Status On-going      PT LONG TERM GOAL #4   Title Patient will demonstrate 4/5 or greater right knee MMT in all planes to improve stability during functional tasks.    Time 4    Period Weeks    Status On-going      PT LONG TERM GOAL #5   Title Patient will report ability to perform ADLs, home activities, and work activites with right knee pain less than or equal to 3/10.    Time 4    Period Weeks    Status On-going                 Plan - 07/28/20 1638    Clinical Impression Statement Patient arrived with more pain in right knee today. Patient educated rests were okay especially on the bike and to take as many as needed. Edema massage and gentle PROM into knee extension performed secondary to more pain. Patient and PT discussed increased inflammation and the importance of rest especially on flared up days. Patient educated to continue HEP but with more rests in between and ice as needed. Patient reported understanding. No adverse affects  upon removal of modalities.     Personal Factors and Comorbidities Age;Time since onset of injury/illness/exacerbation    Examination-Activity Limitations Locomotion Level;Transfers;Stairs;Stand    Examination-Participation Restrictions Cleaning;Occupation    Stability/Clinical Decision Making Stable/Uncomplicated    Clinical Decision Making Low    Rehab Potential Good    PT Frequency 3x / week    PT Duration 4 weeks    PT Treatment/Interventions ADLs/Self Care Home Management;Iontophoresis 4mg /ml Dexamethasone;Gait training;Stair training;Functional mobility training;Therapeutic activities;Electrical Stimulation;Therapeutic exercise;Balance training;Neuromuscular re-education;Manual techniques;Passive range of motion;Patient/family education;Vasopneumatic Device;Moist Heat    PT Next Visit Plan Progress note and FOTO; Nustep, R knee ROM in stting, standing and supine, PROM to improve range, modalities PRN for pain relief.    PT Home Exercise Plan emphasize more extension exercises.    Consulted and Agree with Plan of Care Patient           Patient will benefit from skilled therapeutic intervention in order to improve the following deficits and impairments:  Difficulty walking,Decreased balance,Decreased activity tolerance,Decreased strength,Increased edema,Pain,Decreased range of motion,Abnormal gait  Visit Diagnosis: Acute pain of right knee  Muscle weakness (generalized)  Difficulty in walking, not elsewhere classified  Localized edema     Problem List Patient Active Problem List   Diagnosis Date Noted  . Rectal bleeding 05/10/2020  . Pain management contract agreement 05/16/2016  . Insomnia 04/13/2015  . BMI 26.0-26.9,adult 04/13/2015  . Chronic nausea 10/12/2014  . GAD (generalized anxiety disorder) 05/25/2014  . Hyperlipidemia 11/04/2012  . Migraines 11/04/2012  . GERD (gastroesophageal reflux disease) 11/04/2012  . Fibromyalgia 11/04/2012  . TMJ arthropathy 11/04/2012    Gabriela Eves,  PT, DPT 07/28/2020, 11:19 AM  [facility] [address] [place], [place], [address] Phone: [phone]   Fax:  [phone]  Name: ASMAA TIRPAK MRN: [id number] Date of Birth: 1961-04-03

## 2020-07-31 ENCOUNTER — Other Ambulatory Visit: Payer: Self-pay

## 2020-07-31 ENCOUNTER — Encounter: Payer: Self-pay | Admitting: Physical Therapy

## 2020-07-31 ENCOUNTER — Ambulatory Visit: Payer: 59 | Admitting: Physical Therapy

## 2020-07-31 DIAGNOSIS — R600 Localized edema: Secondary | ICD-10-CM

## 2020-07-31 DIAGNOSIS — R262 Difficulty in walking, not elsewhere classified: Secondary | ICD-10-CM

## 2020-07-31 DIAGNOSIS — M25561 Pain in right knee: Secondary | ICD-10-CM

## 2020-07-31 DIAGNOSIS — M6281 Muscle weakness (generalized): Secondary | ICD-10-CM

## 2020-07-31 NOTE — Therapy (Signed)
[facility] [place], [place], [address] Phone: [phone]   Fax:  [phone]  Physical Therapy Treatment  Patient Details  Name: [name] MRN: [id number] Date of Birth: Oct 31, 1960 Referring Provider (PT): Shelle Iron, [place]   Encounter Date: 07/31/2020   PT End of Session - 07/31/20 1309    Visit Number 11    Number of Visits 18    Date for PT Re-Evaluation 08/07/20    Authorization Type FOTO; Progress note every 10th visit    PT Start Time 1300    PT Stop Time 1352    PT Time Calculation (min) 52 min    Activity Tolerance Patient tolerated treatment well    Behavior During Therapy [facility] for tasks assessed/performed           Past Medical History:  Diagnosis Date  . Arthritis    right hand  . Dyslipidemia   . Family history of anesthesia complication    patients mother has severe nausea and vomiting after  . Fibromyalgia   . GERD (gastroesophageal reflux disease)   . Guillain-Barre syndrome ([place])   . Hiatal hernia   . History of kidney stones   . Kidney stones   . Migraines    one/month  . PONV (postoperative nausea and vomiting)   . TMJ (dislocation of temporomandibular joint)   . UTI (urinary tract infection)    hx of  . Vitamin D deficiency     Past Surgical History:  Procedure Laterality Date  . ABDOMINAL HYSTERECTOMY    . ADNOIDS    . ANTERIOR CERVICAL DECOMP/DISCECTOMY FUSION N/A 10/27/2013   Procedure: ACDF/ANTERIOR CERVICAL DECOMPRESSION/DISCECTOMY FUSION  C5-C7  (2 LEVELS);  Surgeon: Melina Schools, MD;  Location: [facility];  Service: Orthopedics;  Laterality: N/A;  . COLONOSCOPY  05/26/2020  . EYE SURGERY Bilateral    cataract removal  . KNEE ARTHROSCOPY Right   . TOTAL KNEE ARTHROPLASTY Right 06/30/2020   Procedure: TOTAL KNEE ARTHROPLASTY;  Surgeon: Netta Cedars, MD;  [facility];  Service: Orthopedics;  Laterality: Right;  . UPPER GASTROINTESTINAL ENDOSCOPY  05/26/2020    There  were no vitals filed for this visit.   Subjective Assessment - 07/31/20 1308    Subjective COVID-19 screening performed upon arrival. Patient arrives doing better, states she took it easy.    Pertinent History right TKA 06/30/2020; Fibromyalgia, migranes, anterior cervical decompression fusion 10/2013    Limitations Sitting;Standing;Walking;House hold activities    Diagnostic tests x-ray: inflammation    Patient Stated Goals get back to normal    Currently in Pain? Yes    Pain Score 3     Pain Location Knee    Pain Orientation Right    Pain Descriptors / Indicators Throbbing    Pain Type Surgical pain    Pain Onset 1 to 4 weeks ago    Pain Frequency Constant              [facility] PT Assessment - 07/31/20 0001      Assessment   Medical Diagnosis Pain in right knee    Referring Provider (PT) Shelle Iron, PA-C    Onset Date/Surgical Date 06/30/20    Next MD Visit 08/10/2020    Prior Therapy yes      Precautions   Precautions Other (comment)    Precaution Comments no ultrasound      AROM   Right Knee Extension 3    Right Knee Flexion 130  PROM   Right Knee Extension 3    Right Knee Flexion 135                         [facility] Adult PT Treatment/Exercise - 07/31/20 0001      Exercises   Exercises Knee/Hip      Knee/Hip Exercises: Aerobic   Recumbent Bike full revolutions Seat 5 to 3 x10 mins; level 3 resistance at seat 3    Nustep Level 3 x5 mins moving from seat 10 to 8      Knee/Hip Exercises: Standing   Terminal Knee Extension Strengthening;Right;2 sets;10 reps    Terminal Knee Extension Limitations Blue XTS    Forward Step Up Both;1 set;15 reps;Step Height: 6"    Step Down Right;10 reps;Hand Hold: 2;Step Height: 4"    Step Down Limitations heel dot    SLS R SLS x30 seconds    Other Standing Knee Exercises NBOS balance 30 sec followed by eyes closed 30 seconds      Modalities   Modalities Vasopneumatic      Vasopneumatic   Number Minutes  Vasopneumatic  10 minutes    Vasopnuematic Location  Knee    Vasopneumatic Pressure Low    Vasopneumatic Temperature  34      Manual Therapy   Manual Therapy Passive ROM;Edema management    Passive ROM PROM into right knee extension and flexion with gentle holds and oscillations for pain relief in supine                       PT Long Term Goals - 07/31/20 1359      PT LONG TERM GOAL #1   Title Patient will be independent with HEP    Time 4    Period Weeks    Status Achieved      PT LONG TERM GOAL #2   Title Patient will demonstrate 5 degrees or less of right knee extension AROM to improve gait mechanics.    Time 4    Period Weeks    Status Achieved      PT LONG TERM GOAL #3   Title Patient will demonstrate 115+ degrees of right knee flexion AROM to improve functional tasks.    Time 4    Period Weeks    Status Achieved      PT LONG TERM GOAL #4   Title Patient will demonstrate 4/5 or greater right knee MMT in all planes to improve stability during functional tasks.    Time 4    Period Weeks    Status On-going      PT LONG TERM GOAL #5   Title Patient will report ability to perform ADLs, home activities, and work activites with right knee pain less than or equal to 3/10.    Time 4    Period Weeks    Status On-going                 Plan - 07/31/20 1353    Clinical Impression Statement Patient arrived with less pain today in R knee. Patient guided through additional TEs with reports of tightness in R quad. Patient was able to achieve 3-130 degrees of R knee AROM. ROM goal achieved but strength goal ongoing at this time. Normal response to modalities upon removal.    Personal Factors and Comorbidities Age;Time since onset of injury/illness/exacerbation    Examination-Activity Limitations Locomotion Level;Transfers;Stairs;Stand    Examination-Participation Restrictions Cleaning;Occupation  Stability/Clinical Decision Making Stable/Uncomplicated     Clinical Decision Making Low    Rehab Potential Good    PT Frequency 3x / week    PT Duration 4 weeks    PT Treatment/Interventions ADLs/Self Care Home Management;Iontophoresis 4mg /ml Dexamethasone;Gait training;Stair training;Functional mobility training;Therapeutic activities;Electrical Stimulation;Therapeutic exercise;Balance training;Neuromuscular re-education;Manual techniques;Passive range of motion;Patient/family education;Vasopneumatic Device;Moist Heat    PT Next Visit Plan FOTO; Bike,  R knee ROM in stting, standing and supine, slow progression with functional strengthening modalities PRN for pain relief.    PT Home Exercise Plan emphasize more extension exercises.    Consulted and Agree with Plan of Care Patient           Patient will benefit from skilled therapeutic intervention in order to improve the following deficits and impairments:  Difficulty walking,Decreased balance,Decreased activity tolerance,Decreased strength,Increased edema,Pain,Decreased range of motion,Abnormal gait  Visit Diagnosis: Acute pain of right knee  Muscle weakness (generalized)  Difficulty in walking, not elsewhere classified  Localized edema     Problem List Patient Active Problem List   Diagnosis Date Noted  . Rectal bleeding 05/10/2020  . Pain management contract agreement 05/16/2016  . Insomnia 04/13/2015  . BMI 26.0-26.9,adult 04/13/2015  . Chronic nausea 10/12/2014  . GAD (generalized anxiety disorder) 05/25/2014  . Hyperlipidemia 11/04/2012  . Migraines 11/04/2012  . GERD (gastroesophageal reflux disease) 11/04/2012  . Fibromyalgia 11/04/2012  . TMJ arthropathy 11/04/2012    Gabriela Eves, PT, DPT 07/31/2020, 2:03 PM  [facility] [address] [place], [place], [address] Phone: [phone]   Fax:  [phone]  Name: [name] MRN: [id number] Date of Birth: 09-26-1960

## 2020-08-02 ENCOUNTER — Other Ambulatory Visit: Payer: Self-pay

## 2020-08-02 ENCOUNTER — Ambulatory Visit: Payer: 59 | Admitting: Physical Therapy

## 2020-08-02 DIAGNOSIS — R600 Localized edema: Secondary | ICD-10-CM

## 2020-08-02 DIAGNOSIS — R262 Difficulty in walking, not elsewhere classified: Secondary | ICD-10-CM

## 2020-08-02 DIAGNOSIS — M6281 Muscle weakness (generalized): Secondary | ICD-10-CM

## 2020-08-02 DIAGNOSIS — M25561 Pain in right knee: Secondary | ICD-10-CM | POA: Diagnosis not present

## 2020-08-02 NOTE — Therapy (Signed)
[facility] [place], [place], [address] Phone: [phone]   Fax:  [phone]  Physical Therapy Treatment  Patient Details  Name: [name] MRN: [id number] Date of Birth: Jul 18, 1960 Referring Provider (PT): Shelle Iron, [place]   Encounter Date: 08/02/2020   PT End of Session - 08/02/20 1056    Visit Number 12    Number of Visits 18    Date for PT Re-Evaluation 08/07/20    Authorization Type FOTO; Progress note every 10th visit    PT Start Time 0900    PT Stop Time 0946    PT Time Calculation (min) 46 min    Activity Tolerance Patient tolerated treatment well    Behavior During Therapy [facility] for tasks assessed/performed           Past Medical History:  Diagnosis Date  . Arthritis    right hand  . Dyslipidemia   . Family history of anesthesia complication    patients mother has severe nausea and vomiting after  . Fibromyalgia   . GERD (gastroesophageal reflux disease)   . Guillain-Barre syndrome ([place])   . Hiatal hernia   . History of kidney stones   . Kidney stones   . Migraines    one/month  . PONV (postoperative nausea and vomiting)   . TMJ (dislocation of temporomandibular joint)   . UTI (urinary tract infection)    hx of  . Vitamin D deficiency     Past Surgical History:  Procedure Laterality Date  . ABDOMINAL HYSTERECTOMY    . ADNOIDS    . ANTERIOR CERVICAL DECOMP/DISCECTOMY FUSION N/A 10/27/2013   Procedure: ACDF/ANTERIOR CERVICAL DECOMPRESSION/DISCECTOMY FUSION  C5-C7  (2 LEVELS);  Surgeon: Melina Schools, MD;  Location: [facility];  Service: Orthopedics;  Laterality: N/A;  . COLONOSCOPY  05/26/2020  . EYE SURGERY Bilateral    cataract removal  . KNEE ARTHROSCOPY Right   . TOTAL KNEE ARTHROPLASTY Right 06/30/2020   Procedure: TOTAL KNEE ARTHROPLASTY;  Surgeon: Netta Cedars, MD;  Location: [facility];  Service: Orthopedics;  Laterality: Right;  . UPPER GASTROINTESTINAL ENDOSCOPY  05/26/2020    There  were no vitals filed for this visit.   Subjective Assessment - 08/02/20 1054    Subjective COVID-19 screening performed upon arrival. Patient reports doing well and was able to negotiate the steps without a railing. Patient states she wants to feel more confident and stronger on her leg as she would like to return to work next week.    Limitations Sitting;Standing;Walking;House hold activities    Diagnostic tests x-ray: inflammation    Patient Stated Goals get back to normal    Currently in Pain? Yes   did not provide number on pain scale             [facility] PT Assessment - 08/02/20 0001      Assessment   Medical Diagnosis Pain in right knee    Referring Provider (PT) Shelle Iron, PA-C    Onset Date/Surgical Date 06/30/20    Next MD Visit 08/10/2020    Prior Therapy yes      Precautions   Precautions Other (comment)    Precaution Comments no ultrasound                         [facility] Adult PT Treatment/Exercise - 08/02/20 0001      Exercises   Exercises Knee/Hip      Knee/Hip Exercises: Aerobic   Recumbent Bike  full revolutions Seat 3 to 2 x10 mins    Nustep Level 3 x5 mins moving from sea      Knee/Hip Exercises: Seated   Long Arc Quad Right;2 sets;10 reps;Weights    Long Arc Quad Weight 2 lbs.    Hamstring Curl Strengthening;Right;2 sets;10 reps    Hamstring Limitations red theraband      Knee/Hip Exercises: Supine   Short Arc Education administrator (comment)    Short Arc Quad Sets Limitations 2#      Modalities   Modalities Vasopneumatic      Vasopneumatic   Number Minutes Vasopneumatic  10 minutes    Vasopnuematic Location  Knee    Vasopneumatic Pressure Low    Vasopneumatic Temperature  34                        PT Long Term Goals - 07/31/20 1359      PT LONG TERM GOAL #1   Title Patient will be independent with HEP    Time 4    Period Weeks    Status Achieved      PT LONG TERM GOAL #2   Title Patient will  demonstrate 5 degrees or less of right knee extension AROM to improve gait mechanics.    Time 4    Period Weeks    Status Achieved      PT LONG TERM GOAL #3   Title Patient will demonstrate 115+ degrees of right knee flexion AROM to improve functional tasks.    Time 4    Period Weeks    Status Achieved      PT LONG TERM GOAL #4   Title Patient will demonstrate 4/5 or greater right knee MMT in all planes to improve stability during functional tasks.    Time 4    Period Weeks    Status On-going      PT LONG TERM GOAL #5   Title Patient will report ability to perform ADLs, home activities, and work activites with right knee pain less than or equal to 3/10.    Time 4    Period Weeks    Status On-going                 Plan - 08/02/20 1218    Clinical Impression Statement Patient responded well to therapy session today and was able to perform bike at seat 3. Patient progressed to resisted exercises with excellent technique but with fatigue. Patient and PT discussed progressions into balance activities to address her fear of falling. Normal response to modalities upon removal.    Personal Factors and Comorbidities Age;Time since onset of injury/illness/exacerbation    Examination-Activity Limitations Locomotion Level;Transfers;Stairs;Stand    Examination-Participation Restrictions Cleaning;Occupation    Stability/Clinical Decision Making Stable/Uncomplicated    Clinical Decision Making Low    Rehab Potential Good    PT Frequency 3x / week    PT Duration 4 weeks    PT Treatment/Interventions ADLs/Self Care Home Management;Iontophoresis 4mg /ml Dexamethasone;Gait training;Stair training;Functional mobility training;Therapeutic activities;Electrical Stimulation;Therapeutic exercise;Balance training;Neuromuscular re-education;Manual techniques;Passive range of motion;Patient/family education;Vasopneumatic Device;Moist Heat    PT Next Visit Plan FOTO; Bike,  R knee ROM in stting,  standing and supine, slow progression with functional strengthening modalities PRN for pain relief.    PT Home Exercise Plan emphasize more extension exercises.    Consulted and Agree with Plan of Care Patient           Patient will benefit from  skilled therapeutic intervention in order to improve the following deficits and impairments:  Difficulty walking,Decreased balance,Decreased activity tolerance,Decreased strength,Increased edema,Pain,Decreased range of motion,Abnormal gait  Visit Diagnosis: Acute pain of right knee  Muscle weakness (generalized)  Difficulty in walking, not elsewhere classified  Localized edema     Problem List Patient Active Problem List   Diagnosis Date Noted  . Rectal bleeding 05/10/2020  . Pain management contract agreement 05/16/2016  . Insomnia 04/13/2015  . BMI 26.0-26.9,adult 04/13/2015  . Chronic nausea 10/12/2014  . GAD (generalized anxiety disorder) 05/25/2014  . Hyperlipidemia 11/04/2012  . Migraines 11/04/2012  . GERD (gastroesophageal reflux disease) 11/04/2012  . Fibromyalgia 11/04/2012  . TMJ arthropathy 11/04/2012    Gabriela Eves, PT, DPT 08/02/2020, 12:43 PM  [facility] [address] [place], [place], [address] Phone: [phone]   Fax:  [phone]  Name: [name] MRN: [id number] Date of Birth: 1961-04-14

## 2020-08-04 ENCOUNTER — Encounter: Payer: Self-pay | Admitting: Physical Therapy

## 2020-08-04 ENCOUNTER — Ambulatory Visit: Payer: 59 | Admitting: Physical Therapy

## 2020-08-04 ENCOUNTER — Other Ambulatory Visit: Payer: Self-pay

## 2020-08-04 DIAGNOSIS — R600 Localized edema: Secondary | ICD-10-CM

## 2020-08-04 DIAGNOSIS — R262 Difficulty in walking, not elsewhere classified: Secondary | ICD-10-CM

## 2020-08-04 DIAGNOSIS — M25561 Pain in right knee: Secondary | ICD-10-CM

## 2020-08-04 DIAGNOSIS — M6281 Muscle weakness (generalized): Secondary | ICD-10-CM

## 2020-08-04 NOTE — Therapy (Signed)
[facility] [place], [place], [address] Phone: [phone]   Fax:  [phone]  Physical Therapy Treatment  Patient Details  Name: [name] MRN: [id number] Date of Birth: 1960-07-04 Referring Provider (PT): Shelle Iron, [place]   Encounter Date: 08/04/2020   PT End of Session - 08/04/20 0901    Visit Number 13    Number of Visits 18    Date for PT Re-Evaluation 08/07/20    Authorization Type FOTO; Progress note every 10th visit    PT Start Time 0900    PT Stop Time 0948    PT Time Calculation (min) 48 min    Activity Tolerance Patient tolerated treatment well    Behavior During Therapy [facility] for tasks assessed/performed           Past Medical History:  Diagnosis Date  . Arthritis    right hand  . Dyslipidemia   . Family history of anesthesia complication    patients mother has severe nausea and vomiting after  . Fibromyalgia   . GERD (gastroesophageal reflux disease)   . Guillain-Barre syndrome ([place])   . Hiatal hernia   . History of kidney stones   . Kidney stones   . Migraines    one/month  . PONV (postoperative nausea and vomiting)   . TMJ (dislocation of temporomandibular joint)   . UTI (urinary tract infection)    hx of  . Vitamin D deficiency     Past Surgical History:  Procedure Laterality Date  . ABDOMINAL HYSTERECTOMY    . ADNOIDS    . ANTERIOR CERVICAL DECOMP/DISCECTOMY FUSION N/A 10/27/2013   Procedure: ACDF/ANTERIOR CERVICAL DECOMPRESSION/DISCECTOMY FUSION  C5-C7  (2 LEVELS);  Surgeon: Melina Schools, MD;  Location: [facility];  Service: Orthopedics;  Laterality: N/A;  . COLONOSCOPY  05/26/2020  . EYE SURGERY Bilateral    cataract removal  . KNEE ARTHROSCOPY Right   . TOTAL KNEE ARTHROPLASTY Right 06/30/2020   Procedure: TOTAL KNEE ARTHROPLASTY;  Surgeon: Netta Cedars, MD;  [facility];  Service: Orthopedics;  Laterality: Right;  . UPPER GASTROINTESTINAL ENDOSCOPY  05/26/2020    There  were no vitals filed for this visit.   Subjective Assessment - 08/04/20 0902    Subjective COVID-19 screening performed upon arrival. Patient reports she may have over done it the day before last. 2/10.    Pertinent History right TKA 06/30/2020; Fibromyalgia, migranes, anterior cervical decompression fusion 10/2013    Limitations Sitting;Standing;Walking;House hold activities    Diagnostic tests x-ray: inflammation    Patient Stated Goals get back to normal    Currently in Pain? Yes    Pain Score 2     Pain Location Knee    Pain Orientation Right    Pain Descriptors / Indicators Sore    Pain Type Surgical pain    Pain Onset 1 to 4 weeks ago    Pain Frequency Constant              [facility] PT Assessment - 08/04/20 0001      Assessment   Medical Diagnosis Pain in right knee    Referring Provider (PT) Shelle Iron, PA-C    Onset Date/Surgical Date 06/30/20    Next MD Visit 08/10/2020    Prior Therapy yes      Precautions   Precautions Other (comment)    Precaution Comments no ultrasound      Observation/Other Assessments   Focus on Therapeutic Outcomes (FOTO)  48% limitation  [facility] Adult PT Treatment/Exercise - 08/04/20 0001      Exercises   Exercises Knee/Hip      Knee/Hip Exercises: Stretches   Hip Flexor Stretch Both;Limitations;Other (comment)   x2 minutes with IASTM to R quad     Knee/Hip Exercises: Aerobic   Recumbent Bike Seat 3 x10 mins; seat 5 to 3    Nustep Level 3 x5 mins seat 6      Knee/Hip Exercises: Standing   Rocker Board 3 minutes      Knee/Hip Exercises: Prone   Prone Knee Hang 2 minutes    Prone Knee Hang Weights (lbs) 2#    Prone Knee Hang Limitations with IASTM to distal hamstring      Modalities   Modalities Vasopneumatic      Vasopneumatic   Number Minutes Vasopneumatic  15 minutes    Vasopnuematic Location  Knee    Vasopneumatic Pressure Low    Vasopneumatic Temperature  34      Manual Therapy    Manual Therapy Soft tissue mobilization    Soft tissue mobilization IASTM to distal hamstring  and quad to decrease tone and pain                       PT Long Term Goals - 07/31/20 1359      PT LONG TERM GOAL #1   Title Patient will be independent with HEP    Time 4    Period Weeks    Status Achieved      PT LONG TERM GOAL #2   Title Patient will demonstrate 5 degrees or less of right knee extension AROM to improve gait mechanics.    Time 4    Period Weeks    Status Achieved      PT LONG TERM GOAL #3   Title Patient will demonstrate 115+ degrees of right knee flexion AROM to improve functional tasks.    Time 4    Period Weeks    Status Achieved      PT LONG TERM GOAL #4   Title Patient will demonstrate 4/5 or greater right knee MMT in all planes to improve stability during functional tasks.    Time 4    Period Weeks    Status On-going      PT LONG TERM GOAL #5   Title Patient will report ability to perform ADLs, home activities, and work activites with right knee pain less than or equal to 3/10.    Time 4    Period Weeks    Status On-going                 Plan - 08/04/20 1010    Clinical Impression Statement Patient was able to tolerate treatment fairly well though with stiffness and  cramping in R hamstring. IASTM initiated to distal hamstring and quad with good response and good erythema. Patient educated on normal responses to IASTM and justification for it to which she reported understanding. Patient with normal response to modalities upon removal.    Personal Factors and Comorbidities Age;Time since onset of injury/illness/exacerbation    Examination-Activity Limitations Locomotion Level;Transfers;Stairs;Stand    Examination-Participation Restrictions Cleaning;Occupation    Stability/Clinical Decision Making Stable/Uncomplicated    Clinical Decision Making Low    Rehab Potential Good    PT Frequency 3x / week    PT Duration 4 weeks    PT  Treatment/Interventions ADLs/Self Care Home Management;Iontophoresis 4mg /ml Dexamethasone;Gait training;Stair training;Functional mobility training;Therapeutic activities;Electrical Stimulation;Therapeutic  exercise;Balance training;Neuromuscular re-education;Manual techniques;Passive range of motion;Patient/family education;Vasopneumatic Device;Moist Heat    PT Next Visit Plan Bike,  R knee ROM in stting, standing and supine, slow progression with functional strengthening modalities PRN for pain relief.    PT Home Exercise Plan emphasize more extension exercises.    Consulted and Agree with Plan of Care Patient           Patient will benefit from skilled therapeutic intervention in order to improve the following deficits and impairments:  Difficulty walking,Decreased balance,Decreased activity tolerance,Decreased strength,Increased edema,Pain,Decreased range of motion,Abnormal gait  Visit Diagnosis: Acute pain of right knee  Muscle weakness (generalized)  Difficulty in walking, not elsewhere classified  Localized edema     Problem List Patient Active Problem List   Diagnosis Date Noted  . Rectal bleeding 05/10/2020  . Pain management contract agreement 05/16/2016  . Insomnia 04/13/2015  . BMI 26.0-26.9,adult 04/13/2015  . Chronic nausea 10/12/2014  . GAD (generalized anxiety disorder) 05/25/2014  . Hyperlipidemia 11/04/2012  . Migraines 11/04/2012  . GERD (gastroesophageal reflux disease) 11/04/2012  . Fibromyalgia 11/04/2012  . TMJ arthropathy 11/04/2012    Gabriela Eves, PT, DPT 08/04/2020, 11:31 AM  [facility] [address] [place], [place], [address] Phone: [phone]   Fax:  [phone]  Name: [name] MRN: [id number] Date of Birth: June 12, 1960

## 2020-08-07 ENCOUNTER — Encounter: Payer: Self-pay | Admitting: Physical Therapy

## 2020-08-07 ENCOUNTER — Other Ambulatory Visit: Payer: Self-pay

## 2020-08-07 ENCOUNTER — Ambulatory Visit: Payer: 59 | Admitting: Physical Therapy

## 2020-08-07 ENCOUNTER — Other Ambulatory Visit: Payer: Self-pay | Admitting: Nurse Practitioner

## 2020-08-07 DIAGNOSIS — M25561 Pain in right knee: Secondary | ICD-10-CM | POA: Diagnosis not present

## 2020-08-07 DIAGNOSIS — R262 Difficulty in walking, not elsewhere classified: Secondary | ICD-10-CM

## 2020-08-07 DIAGNOSIS — M797 Fibromyalgia: Secondary | ICD-10-CM

## 2020-08-07 DIAGNOSIS — M6281 Muscle weakness (generalized): Secondary | ICD-10-CM

## 2020-08-07 DIAGNOSIS — R600 Localized edema: Secondary | ICD-10-CM

## 2020-08-07 MED ORDER — HYDROCODONE-ACETAMINOPHEN 10-325 MG PO TABS: ORAL_TABLET | ORAL | 0 refills | Status: DC

## 2020-08-07 NOTE — Therapy (Signed)
[facility] [place], [place], [address] Phone: [phone]   Fax:  [phone]  Physical Therapy Treatment  Patient Details  Name: [name] MRN: [id number] Date of Birth: Apr 11, 1961 Referring Provider (PT): Shelle Iron, [place]   Encounter Date: 08/07/2020   PT End of Session - 08/07/20 0906    Visit Number 14    Number of Visits 24    Date for PT Re-Evaluation 09/08/20    Authorization Type FOTO; Progress note every 10th visit    PT Start Time 0900    PT Stop Time 0948    PT Time Calculation (min) 48 min    Activity Tolerance Patient tolerated treatment well    Behavior During Therapy [facility] for tasks assessed/performed           Past Medical History:  Diagnosis Date  . Arthritis    right hand  . Dyslipidemia   . Family history of anesthesia complication    patients mother has severe nausea and vomiting after  . Fibromyalgia   . GERD (gastroesophageal reflux disease)   . Guillain-Barre syndrome ([place])   . Hiatal hernia   . History of kidney stones   . Kidney stones   . Migraines    one/month  . PONV (postoperative nausea and vomiting)   . TMJ (dislocation of temporomandibular joint)   . UTI (urinary tract infection)    hx of  . Vitamin D deficiency     Past Surgical History:  Procedure Laterality Date  . ABDOMINAL HYSTERECTOMY    . ADNOIDS    . ANTERIOR CERVICAL DECOMP/DISCECTOMY FUSION N/A 10/27/2013   Procedure: ACDF/ANTERIOR CERVICAL DECOMPRESSION/DISCECTOMY FUSION  C5-C7  (2 LEVELS);  Surgeon: Melina Schools, MD;  Location: [facility];  Service: Orthopedics;  Laterality: N/A;  . COLONOSCOPY  05/26/2020  . EYE SURGERY Bilateral    cataract removal  . KNEE ARTHROSCOPY Right   . TOTAL KNEE ARTHROPLASTY Right 06/30/2020   Procedure: TOTAL KNEE ARTHROPLASTY;  Surgeon: Netta Cedars, MD;  [facility];  Service: Orthopedics;  Laterality: Right;  . UPPER GASTROINTESTINAL ENDOSCOPY  05/26/2020    There  were no vitals filed for this visit.   Subjective Assessment - 08/07/20 0904    Subjective COVID-19 screening performed upon arrival. Patient reports stiff but feeling good 2/10. Patient was able to cut down some of her garden over the weekend with minimal complaints.    Pertinent History right TKA 06/30/2020; Fibromyalgia, migranes, anterior cervical decompression fusion 10/2013    Limitations Sitting;Standing;Walking;House hold activities    Diagnostic tests x-ray: inflammation    Patient Stated Goals get back to normal    Currently in Pain? Yes    Pain Score 2     Pain Location Knee    Pain Orientation Right    Pain Descriptors / Indicators Sore    Pain Type Surgical pain    Pain Onset 1 to 4 weeks ago    Pain Frequency Constant              [facility] PT Assessment - 08/07/20 0001      Assessment   Medical Diagnosis Pain in right knee    Referring Provider (PT) Shelle Iron, PA-C    Onset Date/Surgical Date 06/30/20    Next MD Visit 08/09/2020    Prior Therapy yes      Precautions   Precautions Other (comment)    Precaution Comments no ultrasound      AROM   Overall AROM  Within functional limits for tasks performed   but pain   Right Knee Extension 4    Right Knee Flexion 138      PROM   Overall PROM  Within functional limits for tasks performed    Right/Left Knee Right    Right Knee Extension 3    Right Knee Flexion 142                         [facility] Adult PT Treatment/Exercise - 08/07/20 0001      Exercises   Exercises Knee/Hip      Knee/Hip Exercises: Aerobic   Recumbent Bike Seat 4-2 x16 mins; seat 5 to 3      Knee/Hip Exercises: Standing   Step Down Right;Hand Hold: 2;15 reps;Step Height: 6"    Rocker Board 3 minutes      Knee/Hip Exercises: Seated   Long Arc Quad Right;2 sets;10 reps;Weights    Long Arc Quad Weight 4 lbs.    Hamstring Curl Strengthening;Right;2 sets;10 reps    Hamstring Limitations red theraband      Modalities    Modalities Vasopneumatic      Vasopneumatic   Number Minutes Vasopneumatic  15 minutes    Vasopnuematic Location  Knee    Vasopneumatic Pressure Low    Vasopneumatic Temperature  34                       PT Long Term Goals - 08/07/20 1153      PT LONG TERM GOAL #1   Title Patient will be independent with HEP    Period Weeks    Status Achieved      PT LONG TERM GOAL #2   Title Patient will demonstrate 5 degrees or less of right knee extension AROM to improve gait mechanics.    Time 4    Period Weeks    Status Achieved      PT LONG TERM GOAL #3   Title Patient will demonstrate 115+ degrees of right knee flexion AROM to improve functional tasks.    Time 4    Period Weeks    Status Achieved      PT LONG TERM GOAL #4   Title Patient will demonstrate 4/5 or greater right knee MMT in all planes to improve stability during functional tasks.    Time 4    Period Weeks    Status On-going      PT LONG TERM GOAL #5   Time 4    Period Weeks    Status On-going                 Plan - 08/07/20 0913    Clinical Impression Statement Patient was able to progress with weights with minimal complaints. Patient still with palpable distal hamstring tightness and notable adhesions with IASTM to lateral hamstring and ITB. Patient achieved 4-138 degrees of right knee AROM. Patient to see MD for follow up on Wednesday, 08/09/2020. Normal response to vasopneumatic device upon removal.    Personal Factors and Comorbidities Age;Time since onset of injury/illness/exacerbation    Examination-Activity Limitations Locomotion Level;Transfers;Stairs;Stand    Examination-Participation Restrictions Cleaning;Occupation    Stability/Clinical Decision Making Stable/Uncomplicated    Clinical Decision Making Low    Rehab Potential Good    PT Frequency 3x / week    PT Duration 4 weeks    PT Treatment/Interventions ADLs/Self Care Home Management;Iontophoresis 4mg /ml Dexamethasone;Gait  training;Stair training;Functional mobility training;Therapeutic activities;Dealer  Stimulation;Therapeutic exercise;Balance training;Neuromuscular re-education;Manual techniques;Passive range of motion;Patient/family education;Vasopneumatic Device;Moist Heat    PT Next Visit Plan Bike,  R knee ROM in stting, standing and supine, slow progression with functional strengthening modalities PRN for pain relief.    PT Home Exercise Plan emphasize more extension exercises.    Consulted and Agree with Plan of Care Patient           Patient will benefit from skilled therapeutic intervention in order to improve the following deficits and impairments:  Difficulty walking,Decreased balance,Decreased activity tolerance,Decreased strength,Increased edema,Pain,Decreased range of motion,Abnormal gait  Visit Diagnosis: Acute pain of right knee  Muscle weakness (generalized)  Difficulty in walking, not elsewhere classified  Localized edema     Problem List Patient Active Problem List   Diagnosis Date Noted  . Rectal bleeding 05/10/2020  . Pain management contract agreement 05/16/2016  . Insomnia 04/13/2015  . BMI 26.0-26.9,adult 04/13/2015  . Chronic nausea 10/12/2014  . GAD (generalized anxiety disorder) 05/25/2014  . Hyperlipidemia 11/04/2012  . Migraines 11/04/2012  . GERD (gastroesophageal reflux disease) 11/04/2012  . Fibromyalgia 11/04/2012  . TMJ arthropathy 11/04/2012    Gabriela Eves 08/07/2020, 12:43 PM  [facility] [place], [place], [address] Phone: [phone]   Fax:  [phone]  Name: [name] MRN: [id number] Date of Birth: 12-11-1960

## 2020-08-09 ENCOUNTER — Ambulatory Visit: Payer: 59 | Attending: Physician Assistant | Admitting: Physical Therapy

## 2020-08-09 ENCOUNTER — Other Ambulatory Visit: Payer: Self-pay

## 2020-08-09 DIAGNOSIS — M6281 Muscle weakness (generalized): Secondary | ICD-10-CM | POA: Insufficient documentation

## 2020-08-09 DIAGNOSIS — R262 Difficulty in walking, not elsewhere classified: Secondary | ICD-10-CM | POA: Insufficient documentation

## 2020-08-09 DIAGNOSIS — M25561 Pain in right knee: Secondary | ICD-10-CM | POA: Diagnosis present

## 2020-08-09 DIAGNOSIS — R600 Localized edema: Secondary | ICD-10-CM | POA: Diagnosis present

## 2020-08-09 NOTE — Therapy (Signed)
[facility] [place], [place], [address] Phone: [phone]   Fax:  [phone]  Physical Therapy Treatment  Patient Details  Name: [name] MRN: [id number] Date of Birth: 11/15/1960 Referring Provider (PT): Shelle Iron, [place]   Encounter Date: 08/09/2020   PT End of Session - 08/09/20 1355    Visit Number 15    Number of Visits 28    Date for PT Re-Evaluation 09/22/20   per new order   Authorization Type FOTO; Progress note every 10th visit    PT Start Time 1345    PT Stop Time 1434    PT Time Calculation (min) 49 min    Activity Tolerance Patient tolerated treatment well    Behavior During Therapy [facility] for tasks assessed/performed           Past Medical History:  Diagnosis Date  . Arthritis    right hand  . Dyslipidemia   . Family history of anesthesia complication    patients mother has severe nausea and vomiting after  . Fibromyalgia   . GERD (gastroesophageal reflux disease)   . Guillain-Barre syndrome ([place])   . Hiatal hernia   . History of kidney stones   . Kidney stones   . Migraines    one/month  . PONV (postoperative nausea and vomiting)   . TMJ (dislocation of temporomandibular joint)   . UTI (urinary tract infection)    hx of  . Vitamin D deficiency     Past Surgical History:  Procedure Laterality Date  . ABDOMINAL HYSTERECTOMY    . ADNOIDS    . ANTERIOR CERVICAL DECOMP/DISCECTOMY FUSION N/A 10/27/2013   Procedure: ACDF/ANTERIOR CERVICAL DECOMPRESSION/DISCECTOMY FUSION  C5-C7  (2 LEVELS);  Surgeon: Melina Schools, MD;  Location: [place];  Service: Orthopedics;  Laterality: N/A;  . COLONOSCOPY  05/26/2020  . EYE SURGERY Bilateral    cataract removal  . KNEE ARTHROSCOPY Right   . TOTAL KNEE ARTHROPLASTY Right 06/30/2020   Procedure: TOTAL KNEE ARTHROPLASTY;  Surgeon: Netta Cedars, MD;  [facility];  Service: Orthopedics;  Laterality: Right;  . UPPER GASTROINTESTINAL ENDOSCOPY   05/26/2020    There were no vitals filed for this visit.   Subjective Assessment - 08/09/20 1351    Subjective COVID-19 screening performed upon arrival. Patient reports feeling more sore after last session. MD appointment went well, states he wants to see patient achieve 1 degree of extension but is pleased with flexion.    Pertinent History right TKA 06/30/2020; Fibromyalgia, migranes, anterior cervical decompression fusion 10/2013    Limitations Sitting;Standing;Walking;House hold activities    Diagnostic tests x-ray: inflammation    Patient Stated Goals get back to normal    Currently in Pain? Yes    Pain Score 5     Pain Location Knee    Pain Orientation Right    Pain Descriptors / Indicators Sore    Pain Type Surgical pain    Pain Onset 1 to 4 weeks ago    Pain Frequency Constant              [facility] PT Assessment - 08/09/20 0001      Assessment   Medical Diagnosis Pain in right knee    Referring Provider (PT) Shelle Iron, PA-C    Onset Date/Surgical Date 06/30/20    Next MD Visit 09/14/2020    Prior Therapy yes      Precautions   Precautions Other (comment)    Precaution Comments no ultrasound  [facility] Adult PT Treatment/Exercise - 08/09/20 0001      Exercises   Exercises Knee/Hip      Knee/Hip Exercises: Stretches   Hip Flexor Stretch Limitations;Other (comment);Right    Hip Flexor Stretch Limitations with IASTM to R quad      Knee/Hip Exercises: Aerobic   Recumbent Bike Seat 4-2 x15 mins; seat 5 to 3      Knee/Hip Exercises: Machines for Strengthening   Cybex Knee Extension 10# x10    Cybex Knee Flexion 20# x10    Cybex Leg Press plate 1 Y86      Knee/Hip Exercises: Standing   Rocker Board 3 minutes      Knee/Hip Exercises: Prone   Prone Knee Hang 3 minutes    Prone Knee Hang Weights (lbs) attempted 5# but with pain, weight removed    Prone Knee Hang Limitations IASTM to distal hamstring      Modalities    Modalities Vasopneumatic      Vasopneumatic   Number Minutes Vasopneumatic  10 minutes    Vasopnuematic Location  Knee    Vasopneumatic Pressure Low    Vasopneumatic Temperature  34                       PT Long Term Goals - 08/07/20 1153      PT LONG TERM GOAL #1   Title Patient will be independent with HEP    Period Weeks    Status Achieved      PT LONG TERM GOAL #2   Title Patient will demonstrate 5 degrees or less of right knee extension AROM to improve gait mechanics.    Time 4    Period Weeks    Status Achieved      PT LONG TERM GOAL #3   Title Patient will demonstrate 115+ degrees of right knee flexion AROM to improve functional tasks.    Time 4    Period Weeks    Status Achieved      PT LONG TERM GOAL #4   Title Patient will demonstrate 4/5 or greater right knee MMT in all planes to improve stability during functional tasks.    Time 4    Period Weeks    Status On-going      PT LONG TERM GOAL #5   Time 4    Period Weeks    Status On-going                 Plan - 08/09/20 1537    Clinical Impression Statement Patient was able to tolerate treatment well but with significant pain with prone hang with 5# weight therefore terminated. Patient was able to progress to strengthening machines maintaining reps at 10x. Patient with increased pain to distal hamstrings with IASTM but with good erythema response. Patient and PT discussed continuing at 3x per week for next week then 2x per week til follow up. Patient has follow up on 09/14/2020 but may cancel if she's doing well. Normal response to modalities upon remvoal.    Personal Factors and Comorbidities Age;Time since onset of injury/illness/exacerbation    Examination-Activity Limitations Locomotion Level;Transfers;Stairs;Stand    Examination-Participation Restrictions Cleaning;Occupation    Stability/Clinical Decision Making Stable/Uncomplicated    Clinical Decision Making Low    Rehab Potential Good     PT Frequency 3x / week    PT Duration 4 weeks    PT Treatment/Interventions ADLs/Self Care Home Management;Iontophoresis 4mg /ml Dexamethasone;Gait training;Stair training;Functional mobility training;Therapeutic activities;Electrical Stimulation;Therapeutic exercise;Balance  training;Neuromuscular re-education;Manual techniques;Passive range of motion;Patient/family education;Vasopneumatic Device;Moist Heat    PT Next Visit Plan Bike,  R knee ROM in stting, standing and supine, slow progression with functional strengthening modalities PRN for pain relief.    PT Home Exercise Plan emphasize more extension exercises.    Consulted and Agree with Plan of Care Patient           Patient will benefit from skilled therapeutic intervention in order to improve the following deficits and impairments:  Difficulty walking,Decreased balance,Decreased activity tolerance,Decreased strength,Increased edema,Pain,Decreased range of motion,Abnormal gait  Visit Diagnosis: Acute pain of right knee  Muscle weakness (generalized)  Difficulty in walking, not elsewhere classified  Localized edema     Problem List Patient Active Problem List   Diagnosis Date Noted  . Rectal bleeding 05/10/2020  . Pain management contract agreement 05/16/2016  . Insomnia 04/13/2015  . BMI 26.0-26.9,adult 04/13/2015  . Chronic nausea 10/12/2014  . GAD (generalized anxiety disorder) 05/25/2014  . Hyperlipidemia 11/04/2012  . Migraines 11/04/2012  . GERD (gastroesophageal reflux disease) 11/04/2012  . Fibromyalgia 11/04/2012  . TMJ arthropathy 11/04/2012    Gabriela Eves, PT, DPT 08/09/2020, 3:44 PM  [facility] [address] [place], [place], [address] Phone: [phone]   Fax:  [phone]  Name: [name] MRN: [id number] Date of Birth: [birth year]

## 2020-08-11 ENCOUNTER — Other Ambulatory Visit: Payer: Self-pay

## 2020-08-11 ENCOUNTER — Encounter: Payer: Self-pay | Admitting: Physical Therapy

## 2020-08-11 ENCOUNTER — Ambulatory Visit: Payer: 59 | Admitting: Physical Therapy

## 2020-08-11 DIAGNOSIS — M6281 Muscle weakness (generalized): Secondary | ICD-10-CM

## 2020-08-11 DIAGNOSIS — M25561 Pain in right knee: Secondary | ICD-10-CM | POA: Diagnosis not present

## 2020-08-11 DIAGNOSIS — R262 Difficulty in walking, not elsewhere classified: Secondary | ICD-10-CM

## 2020-08-11 DIAGNOSIS — R600 Localized edema: Secondary | ICD-10-CM

## 2020-08-11 NOTE — Therapy (Signed)
[facility] [place], [place], [address] Phone: [phone]   Fax:  [phone]  Physical Therapy Treatment  Patient Details  Name: [name] MRN: [id number] Date of Birth: 03/13/1961 Referring Provider (PT): Shelle Iron, [place]   Encounter Date: 08/11/2020   PT End of Session - 08/11/20 0909    Visit Number 16    Number of Visits 28    Date for PT Re-Evaluation 09/22/20    Authorization Type FOTO; Progress note every 10th visit    PT Start Time 0858    PT Stop Time 0946    PT Time Calculation (min) 48 min    Activity Tolerance Patient tolerated treatment well    Behavior During Therapy [facility] for tasks assessed/performed           Past Medical History:  Diagnosis Date  . Arthritis    right hand  . Dyslipidemia   . Family history of anesthesia complication    patients mother has severe nausea and vomiting after  . Fibromyalgia   . GERD (gastroesophageal reflux disease)   . Guillain-Barre syndrome ([place])   . Hiatal hernia   . History of kidney stones   . Kidney stones   . Migraines    one/month  . PONV (postoperative nausea and vomiting)   . TMJ (dislocation of temporomandibular joint)   . UTI (urinary tract infection)    hx of  . Vitamin D deficiency     Past Surgical History:  Procedure Laterality Date  . ABDOMINAL HYSTERECTOMY    . ADNOIDS    . ANTERIOR CERVICAL DECOMP/DISCECTOMY FUSION N/A 10/27/2013   Procedure: ACDF/ANTERIOR CERVICAL DECOMPRESSION/DISCECTOMY FUSION  C5-C7  (2 LEVELS);  Surgeon: Melina Schools, MD;  Location: [place];  Service: Orthopedics;  Laterality: N/A;  . COLONOSCOPY  05/26/2020  . EYE SURGERY Bilateral    cataract removal  . KNEE ARTHROSCOPY Right   . TOTAL KNEE ARTHROPLASTY Right 06/30/2020   Procedure: TOTAL KNEE ARTHROPLASTY;  Surgeon: Netta Cedars, MD;  [facility];  Service: Orthopedics;  Laterality: Right;  . UPPER GASTROINTESTINAL ENDOSCOPY  05/26/2020    There  were no vitals filed for this visit.   Subjective Assessment - 08/11/20 0908    Subjective COVID-19 screening performed upon arrival. Patient reported feeling more sore swollen after last session.    Pertinent History right TKA 06/30/2020; Fibromyalgia, migranes, anterior cervical decompression fusion 10/2013    Limitations Sitting;Standing;Walking;House hold activities    Diagnostic tests x-ray: inflammation    Patient Stated Goals get back to normal    Currently in Pain? Other (Comment)   minimal pain, more swelling             [facility] PT Assessment - 08/11/20 0001      Assessment   Medical Diagnosis Pain in right knee    Referring Provider (PT) Shelle Iron, PA-C    Onset Date/Surgical Date 06/30/20    Next MD Visit 09/14/2020    Prior Therapy yes      Precautions   Precautions Other (comment)    Precaution Comments no ultrasound                         [facility] Adult PT Treatment/Exercise - 08/11/20 0001      Exercises   Exercises Knee/Hip      Knee/Hip Exercises: Aerobic   Recumbent Bike Seat 7-5 x15 mins; seat 5 to 3      Knee/Hip Exercises:  Standing   Terminal Knee Extension Strengthening;Right;2 sets;10 reps    Theraband Level (Terminal Knee Extension) Level 2 (Red)    Rocker Board 3 minutes      Modalities   Modalities Vasopneumatic      Electrical Stimulation   Electrical Stimulation Location right knee    Electrical Stimulation Action IFC    Electrical Stimulation Parameters 1-10 hz x15 mins    Electrical Stimulation Goals Pain;Edema      Vasopneumatic   Number Minutes Vasopneumatic  15 minutes    Vasopnuematic Location  Knee    Vasopneumatic Pressure Medium    Vasopneumatic Temperature  34      Manual Therapy   Edema Management edema massage distal to proximal for right knee to decrease swelling    Passive ROM very gentle PROM into knee extension while perfroming edema massage                       PT Long Term Goals -  08/07/20 1153      PT LONG TERM GOAL #1   Title Patient will be independent with HEP    Period Weeks    Status Achieved      PT LONG TERM GOAL #2   Title Patient will demonstrate 5 degrees or less of right knee extension AROM to improve gait mechanics.    Time 4    Period Weeks    Status Achieved      PT LONG TERM GOAL #3   Title Patient will demonstrate 115+ degrees of right knee flexion AROM to improve functional tasks.    Time 4    Period Weeks    Status Achieved      PT LONG TERM GOAL #4   Title Patient will demonstrate 4/5 or greater right knee MMT in all planes to improve stability during functional tasks.    Time 4    Period Weeks    Status On-going      PT LONG TERM GOAL #5   Time 4    Period Weeks    Status On-going                 Plan - 08/11/20 0940    Clinical Impression Statement Paitne arrived with more stiffness and swelling in right knee since last session. Patient guided through TEs but with rest breaks secondary to pain. Patient noted with more edema under the patella today. Patient and PT discussed irritability of R knee. Patient educated on right knee distal to proximal edema massage. E-stim performed for edema management along with vasopneumatic device with no adverse affects.    Personal Factors and Comorbidities Age;Time since onset of injury/illness/exacerbation    Examination-Activity Limitations Locomotion Level;Transfers;Stairs;Stand    Examination-Participation Restrictions Cleaning;Occupation    Stability/Clinical Decision Making Stable/Uncomplicated    Clinical Decision Making Low    Rehab Potential Good    PT Frequency 3x / week    PT Duration 4 weeks    PT Treatment/Interventions ADLs/Self Care Home Management;Iontophoresis 4mg /ml Dexamethasone;Gait training;Stair training;Functional mobility training;Therapeutic activities;Electrical Stimulation;Therapeutic exercise;Balance training;Neuromuscular re-education;Manual techniques;Passive  range of motion;Patient/family education;Vasopneumatic Device;Moist Heat    PT Next Visit Plan Bike,  R knee ROM in stting, standing and supine, slow progression with functional strengthening modalities PRN for pain relief.    PT Home Exercise Plan emphasize more extension exercises.    Consulted and Agree with Plan of Care Patient           Patient will  benefit from skilled therapeutic intervention in order to improve the following deficits and impairments:  Difficulty walking,Decreased balance,Decreased activity tolerance,Decreased strength,Increased edema,Pain,Decreased range of motion,Abnormal gait  Visit Diagnosis: Acute pain of right knee  Muscle weakness (generalized)  Difficulty in walking, not elsewhere classified  Localized edema     Problem List Patient Active Problem List   Diagnosis Date Noted  . Rectal bleeding 05/10/2020  . Pain management contract agreement 05/16/2016  . Insomnia 04/13/2015  . BMI 26.0-26.9,adult 04/13/2015  . Chronic nausea 10/12/2014  . GAD (generalized anxiety disorder) 05/25/2014  . Hyperlipidemia 11/04/2012  . Migraines 11/04/2012  . GERD (gastroesophageal reflux disease) 11/04/2012  . Fibromyalgia 11/04/2012  . TMJ arthropathy 11/04/2012    Gabriela Eves, PT, DPT 08/11/2020, 11:32 AM  [facility] [address] [place], [place], [address] Phone: [phone]   Fax:  [phone]  Name: [name] MRN: [id number] Date of Birth: 01/04/1961

## 2020-08-14 ENCOUNTER — Ambulatory Visit: Payer: 59 | Admitting: Physical Therapy

## 2020-08-14 ENCOUNTER — Encounter: Payer: Self-pay | Admitting: Physical Therapy

## 2020-08-14 ENCOUNTER — Other Ambulatory Visit: Payer: Self-pay

## 2020-08-14 DIAGNOSIS — R262 Difficulty in walking, not elsewhere classified: Secondary | ICD-10-CM

## 2020-08-14 DIAGNOSIS — M25561 Pain in right knee: Secondary | ICD-10-CM

## 2020-08-14 DIAGNOSIS — R600 Localized edema: Secondary | ICD-10-CM

## 2020-08-14 DIAGNOSIS — M6281 Muscle weakness (generalized): Secondary | ICD-10-CM

## 2020-08-14 NOTE — Therapy (Signed)
[facility] [place], [place], [address] Phone: [phone]   Fax:  [phone]  Physical Therapy Treatment  Patient Details  Name: [name] MRN: [id number] Date of Birth: [birth year] Referring Provider (PT): Shelle Iron, [place]   Encounter Date: 08/14/2020   PT End of Session - 08/14/20 1542    Visit Number 17    Number of Visits 28    Date for PT Re-Evaluation 09/22/20    Authorization Type FOTO; Progress note every 10th visit    PT Start Time 1303    PT Stop Time 1348    PT Time Calculation (min) 45 min    Activity Tolerance Patient tolerated treatment well    Behavior During Therapy [facility] for tasks assessed/performed           Past Medical History:  Diagnosis Date  . Arthritis    right hand  . Dyslipidemia   . Family history of anesthesia complication    patients mother has severe nausea and vomiting after  . Fibromyalgia   . GERD (gastroesophageal reflux disease)   . Guillain-Barre syndrome ([place])   . Hiatal hernia   . History of kidney stones   . Kidney stones   . Migraines    one/month  . PONV (postoperative nausea and vomiting)   . TMJ (dislocation of temporomandibular joint)   . UTI (urinary tract infection)    hx of  . Vitamin D deficiency     Past Surgical History:  Procedure Laterality Date  . ABDOMINAL HYSTERECTOMY    . ADNOIDS    . ANTERIOR CERVICAL DECOMP/DISCECTOMY FUSION N/A 10/27/2013   Procedure: ACDF/ANTERIOR CERVICAL DECOMPRESSION/DISCECTOMY FUSION  C5-C7  (2 LEVELS);  Surgeon: Melina Schools, MD;  Location: [place];  Service: Orthopedics;  Laterality: N/A;  . COLONOSCOPY  05/26/2020  . EYE SURGERY Bilateral    cataract removal  . KNEE ARTHROSCOPY Right   . TOTAL KNEE ARTHROPLASTY Right 06/30/2020   Procedure: TOTAL KNEE ARTHROPLASTY;  Surgeon: Netta Cedars, MD;  [facility];  Service: Orthopedics;  Laterality: Right;  . UPPER GASTROINTESTINAL ENDOSCOPY  05/26/2020    There  were no vitals filed for this visit.   Subjective Assessment - 08/14/20 1402    Subjective COVID-19 screening performed upon arrival. Patient reports feeling more sore as she started returning to work.    Pertinent History right TKA 06/30/2020; Fibromyalgia, migranes, anterior cervical decompression fusion 10/2013    Limitations Sitting;Standing;Walking;House hold activities    Diagnostic tests x-ray: inflammation    Patient Stated Goals get back to normal    Currently in Pain? Yes   did not provide number on pain scale             [facility] PT Assessment - 08/14/20 0001      Assessment   Medical Diagnosis Pain in right knee    Referring Provider (PT) Shelle Iron, PA-C    Onset Date/Surgical Date 06/30/20    Next MD Visit 09/14/2020    Prior Therapy yes      Precautions   Precautions Other (comment)    Precaution Comments no ultrasound                         [facility] Adult PT Treatment/Exercise - 08/14/20 0001      Ambulation/Gait   Stairs Yes    Stairs Assistance 7: Independent    Stair Management Technique No rails;Alternating pattern    Number of Stairs 4  x5   Height of Stairs 6.5      Exercises   Exercises Knee/Hip      Knee/Hip Exercises: Aerobic   Recumbent Bike Seat 7-5 x15 mins; seat 5 to 3      Knee/Hip Exercises: Machines for Strengthening   Cybex Leg Press plate 1 S34; heel raises 1 plate x10      Knee/Hip Exercises: Standing   Forward Step Up Right;15 reps;Step Height: 8";Hand Hold: 2      Knee/Hip Exercises: Seated   Hamstring Curl Strengthening;Right;2 sets;10 reps    Hamstring Limitations green theraband      Modalities   Modalities Vasopneumatic      Vasopneumatic   Number Minutes Vasopneumatic  10 minutes    Vasopnuematic Location  Knee    Vasopneumatic Pressure Medium    Vasopneumatic Temperature  34                       PT Long Term Goals - 08/07/20 1153      PT LONG TERM GOAL #1   Title Patient will be  independent with HEP    Period Weeks    Status Achieved      PT LONG TERM GOAL #2   Title Patient will demonstrate 5 degrees or less of right knee extension AROM to improve gait mechanics.    Time 4    Period Weeks    Status Achieved      PT LONG TERM GOAL #3   Title Patient will demonstrate 115+ degrees of right knee flexion AROM to improve functional tasks.    Time 4    Period Weeks    Status Achieved      PT LONG TERM GOAL #4   Title Patient will demonstrate 4/5 or greater right knee MMT in all planes to improve stability during functional tasks.    Time 4    Period Weeks    Status On-going      PT LONG TERM GOAL #5   Time 4    Period Weeks    Status On-going                 Plan - 08/14/20 1542    Clinical Impression Statement Patient responded fairly well despite more pain due to returning to work today. Patient guided through strengthening exercises with good response and excellent technique after explanation and demonstration. Patient instructed to continue to ice and get up throughout the work day to prevent stiffness. Patient reported understanding. No adverse affects upon removal of modalities.    Personal Factors and Comorbidities Age;Time since onset of injury/illness/exacerbation    Examination-Activity Limitations Locomotion Level;Transfers;Stairs;Stand    Examination-Participation Restrictions Cleaning;Occupation    Stability/Clinical Decision Making Stable/Uncomplicated    Clinical Decision Making Low    Rehab Potential Good    PT Frequency 3x / week    PT Duration 4 weeks    PT Treatment/Interventions ADLs/Self Care Home Management;Iontophoresis 4mg /ml Dexamethasone;Gait training;Stair training;Functional mobility training;Therapeutic activities;Electrical Stimulation;Therapeutic exercise;Balance training;Neuromuscular re-education;Manual techniques;Passive range of motion;Patient/family education;Vasopneumatic Device;Moist Heat    PT Next Visit Plan  Bike,  R knee ROM in stting, standing and supine, slow progression with functional strengthening modalities PRN for pain relief.    Consulted and Agree with Plan of Care Patient           Patient will benefit from skilled therapeutic intervention in order to improve the following deficits and impairments:  Difficulty walking,Decreased balance,Decreased activity tolerance,Decreased strength,Increased edema,Pain,Decreased  range of motion,Abnormal gait  Visit Diagnosis: Acute pain of right knee  Muscle weakness (generalized)  Difficulty in walking, not elsewhere classified  Localized edema     Problem List Patient Active Problem List   Diagnosis Date Noted  . Rectal bleeding 05/10/2020  . Pain management contract agreement 05/16/2016  . Insomnia 04/13/2015  . BMI 26.0-26.9,adult 04/13/2015  . Chronic nausea 10/12/2014  . GAD (generalized anxiety disorder) 05/25/2014  . Hyperlipidemia 11/04/2012  . Migraines 11/04/2012  . GERD (gastroesophageal reflux disease) 11/04/2012  . Fibromyalgia 11/04/2012  . TMJ arthropathy 11/04/2012    Gabriela Eves, PT, DPT 08/14/2020, 3:49 PM  [facility] [address] [place], [place], [address] Phone: [phone]   Fax:  [phone]  Name: [name] MRN: [id number] Date of Birth: Nov 01, 1960

## 2020-08-16 ENCOUNTER — Ambulatory Visit: Payer: 59 | Admitting: Physical Therapy

## 2020-08-16 ENCOUNTER — Other Ambulatory Visit: Payer: Self-pay

## 2020-08-16 ENCOUNTER — Encounter: Payer: Self-pay | Admitting: Physical Therapy

## 2020-08-16 DIAGNOSIS — M25561 Pain in right knee: Secondary | ICD-10-CM | POA: Diagnosis not present

## 2020-08-16 DIAGNOSIS — M6281 Muscle weakness (generalized): Secondary | ICD-10-CM

## 2020-08-16 DIAGNOSIS — R262 Difficulty in walking, not elsewhere classified: Secondary | ICD-10-CM

## 2020-08-16 DIAGNOSIS — R600 Localized edema: Secondary | ICD-10-CM

## 2020-08-16 NOTE — Therapy (Signed)
[facility] [place], [place], [address] Phone: [phone]   Fax:  [phone]  Physical Therapy Treatment  Patient Details  Name: [name] MRN: [id number] Date of Birth: 01/07/1961 Referring Provider (PT): Shelle Iron, [place]   Encounter Date: 08/16/2020   PT End of Session - 08/16/20 1030    Visit Number 18    Number of Visits 28    Date for PT Re-Evaluation 09/22/20    Authorization Type FOTO; Progress note every 10th visit    PT Start Time 0945    PT Stop Time 1034    PT Time Calculation (min) 49 min    Activity Tolerance Patient tolerated treatment well    Behavior During Therapy [facility] for tasks assessed/performed           Past Medical History:  Diagnosis Date  . Arthritis    right hand  . Dyslipidemia   . Family history of anesthesia complication    patients mother has severe nausea and vomiting after  . Fibromyalgia   . GERD (gastroesophageal reflux disease)   . Guillain-Barre syndrome ([place])   . Hiatal hernia   . History of kidney stones   . Kidney stones   . Migraines    one/month  . PONV (postoperative nausea and vomiting)   . TMJ (dislocation of temporomandibular joint)   . UTI (urinary tract infection)    hx of  . Vitamin D deficiency     Past Surgical History:  Procedure Laterality Date  . ABDOMINAL HYSTERECTOMY    . ADNOIDS    . ANTERIOR CERVICAL DECOMP/DISCECTOMY FUSION N/A 10/27/2013   Procedure: ACDF/ANTERIOR CERVICAL DECOMPRESSION/DISCECTOMY FUSION  C5-C7  (2 LEVELS);  Surgeon: Melina Schools, MD;  Location: [place];  Service: Orthopedics;  Laterality: N/A;  . COLONOSCOPY  05/26/2020  . EYE SURGERY Bilateral    cataract removal  . KNEE ARTHROSCOPY Right   . TOTAL KNEE ARTHROPLASTY Right 06/30/2020   Procedure: TOTAL KNEE ARTHROPLASTY;  Surgeon: Netta Cedars, MD;  [facility];  Service: Orthopedics;  Laterality: Right;  . UPPER GASTROINTESTINAL ENDOSCOPY  05/26/2020    There  were no vitals filed for this visit.   Subjective Assessment - 08/16/20 0958    Subjective COVID-19 screening performed upon arrival. Patient reports more soreness from weather but overall doing fairly well. Needed to do stretching in the morning to stay flexible    Pertinent History right TKA 06/30/2020; Fibromyalgia, migranes, anterior cervical decompression fusion 10/2013    Limitations Sitting;Standing;Walking;House hold activities    Diagnostic tests x-ray: inflammation    Patient Stated Goals get back to normal    Currently in Pain? Yes   did not provide number on pain scale   Pain Location Knee    Pain Orientation Right    Pain Descriptors / Indicators Sore    Pain Type Surgical pain    Pain Onset 1 to 4 weeks ago    Pain Frequency Constant              [facility] PT Assessment - 08/16/20 0001      Assessment   Medical Diagnosis Pain in right knee    Referring Provider (PT) Shelle Iron, PA-C    Onset Date/Surgical Date 06/30/20    Next MD Visit 09/14/2020    Prior Therapy yes      Precautions   Precautions Other (comment)    Precaution Comments no ultrasound      AROM   Right Knee Extension  2    Right Knee Flexion 140                         [facility] Adult PT Treatment/Exercise - 08/16/20 0001      Exercises   Exercises Knee/Hip      Knee/Hip Exercises: Aerobic   Recumbent Bike Level 4 Seat 5-2 x15 mins;      Knee/Hip Exercises: Machines for Strengthening   Cybex Leg Press plate 1 1U27; heel raises 1 plate 2x10      Knee/Hip Exercises: Standing   Other Standing Knee Exercises mini split squat to 4" step and airex x10; followed quarter split squat for technique bilaterally x10 each      Vasopneumatic   Number Minutes Vasopneumatic  10 minutes    Vasopnuematic Location  Knee    Vasopneumatic Pressure Medium    Vasopneumatic Temperature  34      Manual Therapy   Passive ROM PROM into knee extension with overpressure                        PT Long Term Goals - 08/07/20 1153      PT LONG TERM GOAL #1   Title Patient will be independent with HEP    Period Weeks    Status Achieved      PT LONG TERM GOAL #2   Title Patient will demonstrate 5 degrees or less of right knee extension AROM to improve gait mechanics.    Time 4    Period Weeks    Status Achieved      PT LONG TERM GOAL #3   Title Patient will demonstrate 115+ degrees of right knee flexion AROM to improve functional tasks.    Time 4    Period Weeks    Status Achieved      PT LONG TERM GOAL #4   Title Patient will demonstrate 4/5 or greater right knee MMT in all planes to improve stability during functional tasks.    Time 4    Period Weeks    Status On-going      PT LONG TERM GOAL #5   Time 4    Period Weeks    Status On-going                 Plan - 08/16/20 1031    Clinical Impression Statement Patient responded well to therapy session but with some reports of pain with lunge and going into a half kneel. Patient provided with verbal and tactile cues for knee alignment and hip abduction activation for proper knee/toe alignment. Patient responded much better with technique when performed with quarter split squats. Patient is much more tolerable to knee extension PROM. AROM measured at 2-140 degrees. Normal response to modalities upon removal.    Personal Factors and Comorbidities Age;Time since onset of injury/illness/exacerbation    Examination-Activity Limitations Locomotion Level;Transfers;Stairs;Stand    Examination-Participation Restrictions Cleaning;Occupation    Stability/Clinical Decision Making Stable/Uncomplicated    Clinical Decision Making Low    Rehab Potential Good    PT Frequency 3x / week    PT Duration 4 weeks    PT Treatment/Interventions ADLs/Self Care Home Management;Iontophoresis 4mg /ml Dexamethasone;Gait training;Stair training;Functional mobility training;Therapeutic activities;Electrical  Stimulation;Therapeutic exercise;Balance training;Neuromuscular re-education;Manual techniques;Passive range of motion;Patient/family education;Vasopneumatic Device;Moist Heat    PT Next Visit Plan Bike,  R knee ROM in stting, standing and supine, slow progression with functional strengthening modalities PRN for pain relief.  PT Home Exercise Plan emphasize more extension exercises.    Consulted and Agree with Plan of Care Patient           Patient will benefit from skilled therapeutic intervention in order to improve the following deficits and impairments:  Difficulty walking,Decreased balance,Decreased activity tolerance,Decreased strength,Increased edema,Pain,Decreased range of motion,Abnormal gait  Visit Diagnosis: Acute pain of right knee  Muscle weakness (generalized)  Difficulty in walking, not elsewhere classified  Localized edema     Problem List Patient Active Problem List   Diagnosis Date Noted  . Rectal bleeding 05/10/2020  . Pain management contract agreement 05/16/2016  . Insomnia 04/13/2015  . BMI 26.0-26.9,adult 04/13/2015  . Chronic nausea 10/12/2014  . GAD (generalized anxiety disorder) 05/25/2014  . Hyperlipidemia 11/04/2012  . Migraines 11/04/2012  . GERD (gastroesophageal reflux disease) 11/04/2012  . Fibromyalgia 11/04/2012  . TMJ arthropathy 11/04/2012    Gabriela Eves 08/16/2020, 11:51 AM  [facility] [address] [place], [place], [address] Phone: [phone]   Fax:  [phone]  Name: [name] MRN: [id number] Date of Birth: [birth year]

## 2020-08-18 ENCOUNTER — Other Ambulatory Visit: Payer: Self-pay

## 2020-08-18 ENCOUNTER — Encounter: Payer: 59 | Admitting: Physical Therapy

## 2020-08-18 ENCOUNTER — Ambulatory Visit: Payer: 59 | Admitting: Physical Therapy

## 2020-08-18 DIAGNOSIS — M25561 Pain in right knee: Secondary | ICD-10-CM

## 2020-08-18 DIAGNOSIS — M6281 Muscle weakness (generalized): Secondary | ICD-10-CM

## 2020-08-18 DIAGNOSIS — R600 Localized edema: Secondary | ICD-10-CM

## 2020-08-18 DIAGNOSIS — R262 Difficulty in walking, not elsewhere classified: Secondary | ICD-10-CM

## 2020-08-18 NOTE — Therapy (Signed)
[facility] [place], [place], [address] Phone: [phone]   Fax:  [phone]  Physical Therapy Treatment  Patient Details  Name: [name] MRN: [id number] Date of Birth: [birth year] Referring Provider (PT): Shelle Iron, [place]   Encounter Date: 08/18/2020   PT End of Session - 08/18/20 1153    Visit Number 19    Number of Visits 28    Date for PT Re-Evaluation 09/22/20    Authorization Type FOTO; Progress note every 10th visit    PT Start Time 1115    PT Stop Time 1203    PT Time Calculation (min) 48 min    Activity Tolerance Patient tolerated treatment well    Behavior During Therapy [facility] for tasks assessed/performed           Past Medical History:  Diagnosis Date  . Arthritis    right hand  . Dyslipidemia   . Family history of anesthesia complication    patients mother has severe nausea and vomiting after  . Fibromyalgia   . GERD (gastroesophageal reflux disease)   . Guillain-Barre syndrome ([place])   . Hiatal hernia   . History of kidney stones   . Kidney stones   . Migraines    one/month  . PONV (postoperative nausea and vomiting)   . TMJ (dislocation of temporomandibular joint)   . UTI (urinary tract infection)    hx of  . Vitamin D deficiency     Past Surgical History:  Procedure Laterality Date  . ABDOMINAL HYSTERECTOMY    . ADNOIDS    . ANTERIOR CERVICAL DECOMP/DISCECTOMY FUSION N/A 10/27/2013   Procedure: ACDF/ANTERIOR CERVICAL DECOMPRESSION/DISCECTOMY FUSION  C5-C7  (2 LEVELS);  Surgeon: Melina Schools, MD;  Location: [place];  Service: Orthopedics;  Laterality: N/A;  . COLONOSCOPY  05/26/2020  . EYE SURGERY Bilateral    cataract removal  . KNEE ARTHROSCOPY Right   . TOTAL KNEE ARTHROPLASTY Right 06/30/2020   Procedure: TOTAL KNEE ARTHROPLASTY;  Surgeon: Netta Cedars, MD;  [facility];  Service: Orthopedics;  Laterality: Right;  . UPPER GASTROINTESTINAL ENDOSCOPY  05/26/2020    There  were no vitals filed for this visit.   Subjective Assessment - 08/18/20 1209    Subjective COVID-19 screening performed upon arrival.Patient reports having a flare up of fibromyalgia but states she feels like she has made a turning point with balance this past week.    Pertinent History right TKA 06/30/2020; Fibromyalgia, migranes, anterior cervical decompression fusion 10/2013    Limitations Sitting;Standing;Walking;House hold activities    Diagnostic tests x-ray: inflammation    Patient Stated Goals get back to normal    Currently in Pain? Yes   "sore"             [facility] PT Assessment - 08/18/20 0001      Assessment   Medical Diagnosis Pain in right knee    Referring Provider (PT) Shelle Iron, PA-C    Onset Date/Surgical Date 06/30/20    Next MD Visit 09/14/2020    Prior Therapy yes      Precautions   Precautions Other (comment)    Precaution Comments no ultrasound                         [facility] Adult PT Treatment/Exercise - 08/18/20 0001      Exercises   Exercises Knee/Hip      Knee/Hip Exercises: Aerobic   Recumbent Bike Level 4 Seat 5-2 x12  mins;      Knee/Hip Exercises: Machines for Strengthening   Cybex Leg Press plate 1 A35 eccentric control      Knee/Hip Exercises: Standing   Forward Step Up Right;20 reps;Hand Hold: 2;Step Height: 8"    Step Down Right;1 set;Hand Hold: 2;Hand Hold: 1;Hand Hold: 0;Step Height: 6";10 reps      Modalities   Modalities Vasopneumatic      Vasopneumatic   Number Minutes Vasopneumatic  10 minutes    Vasopnuematic Location  Knee    Vasopneumatic Pressure Medium    Vasopneumatic Temperature  34      Manual Therapy   Soft tissue mobilization IASTM to distal hamstring decrease tone and pain                       PT Long Term Goals - 08/07/20 1153      PT LONG TERM GOAL #1   Title Patient will be independent with HEP    Period Weeks    Status Achieved      PT LONG TERM GOAL #2   Title Patient will  demonstrate 5 degrees or less of right knee extension AROM to improve gait mechanics.    Time 4    Period Weeks    Status Achieved      PT LONG TERM GOAL #3   Title Patient will demonstrate 115+ degrees of right knee flexion AROM to improve functional tasks.    Time 4    Period Weeks    Status Achieved      PT LONG TERM GOAL #4   Title Patient will demonstrate 4/5 or greater right knee MMT in all planes to improve stability during functional tasks.    Time 4    Period Weeks    Status On-going      PT LONG TERM GOAL #5   Time 4    Period Weeks    Status On-going                 Plan - 08/18/20 1206    Clinical Impression Statement Patient was able to complete treatment with some reports of pain but stated she was having a flare up of fibromyalgia. Patient demonstrated good technique with step ups and step downs. Patient was able to complete eccentric leg press with tactile cue to prevent dynamic knee valgus. Patient with ongoing tightness and tone in distal hamstring but released after STW/M and IASTM. No adverse affects upon removal of modalities.    Personal Factors and Comorbidities Age;Time since onset of injury/illness/exacerbation    Examination-Activity Limitations Locomotion Level;Transfers;Stairs;Stand    Examination-Participation Restrictions Cleaning;Occupation    Stability/Clinical Decision Making Stable/Uncomplicated    Clinical Decision Making Low    Rehab Potential Good    PT Frequency 3x / week    PT Duration 4 weeks    PT Treatment/Interventions ADLs/Self Care Home Management;Iontophoresis 4mg /ml Dexamethasone;Gait training;Stair training;Functional mobility training;Therapeutic activities;Electrical Stimulation;Therapeutic exercise;Balance training;Neuromuscular re-education;Manual techniques;Passive range of motion;Patient/family education;Vasopneumatic Device;Moist Heat    PT Next Visit Plan Bike,  R knee ROM in stting, standing and supine, slow  progression with functional strengthening modalities PRN for pain relief.    Consulted and Agree with Plan of Care Patient           Patient will benefit from skilled therapeutic intervention in order to improve the following deficits and impairments:  Difficulty walking,Decreased balance,Decreased activity tolerance,Decreased strength,Increased edema,Pain,Decreased range of motion,Abnormal gait  Visit Diagnosis: Acute pain of  right knee  Muscle weakness (generalized)  Difficulty in walking, not elsewhere classified  Localized edema     Problem List Patient Active Problem List   Diagnosis Date Noted  . Rectal bleeding 05/10/2020  . Pain management contract agreement 05/16/2016  . Insomnia 04/13/2015  . BMI 26.0-26.9,adult 04/13/2015  . Chronic nausea 10/12/2014  . GAD (generalized anxiety disorder) 05/25/2014  . Hyperlipidemia 11/04/2012  . Migraines 11/04/2012  . GERD (gastroesophageal reflux disease) 11/04/2012  . Fibromyalgia 11/04/2012  . TMJ arthropathy 11/04/2012    Gabriela Eves, PT, DPT 08/18/2020, 12:11 PM  [facility] [address] [place], [place], [address] Phone: [phone]   Fax:  [phone]  Name: KARNA ABED MRN: [id number] Date of Birth: June 15, 1960

## 2020-08-21 ENCOUNTER — Encounter: Payer: 59 | Admitting: Physical Therapy

## 2020-08-22 ENCOUNTER — Other Ambulatory Visit: Payer: Self-pay

## 2020-08-22 ENCOUNTER — Encounter: Payer: Self-pay | Admitting: Physical Therapy

## 2020-08-22 ENCOUNTER — Ambulatory Visit: Payer: 59 | Admitting: Physical Therapy

## 2020-08-22 DIAGNOSIS — M25561 Pain in right knee: Secondary | ICD-10-CM | POA: Diagnosis not present

## 2020-08-22 DIAGNOSIS — M6281 Muscle weakness (generalized): Secondary | ICD-10-CM

## 2020-08-22 DIAGNOSIS — R262 Difficulty in walking, not elsewhere classified: Secondary | ICD-10-CM

## 2020-08-22 DIAGNOSIS — R600 Localized edema: Secondary | ICD-10-CM

## 2020-08-22 NOTE — Therapy (Signed)
[facility] [place], [place], [address] Phone: [phone]   Fax:  [phone]  Physical Therapy Treatment  Progress Note Reporting Period 07/31/2020 to 08/22/2020  See note below for Objective Data and Assessment of Progress/Goals. Patient is making significant gains towards goals but still limited with strength and endurance. Gabriela Eves, PT, DPT     Patient Details  Name: GODDESS GEBBIA MRN: [id number] Date of Birth: 1961/06/08 Referring Provider (PT): Shelle Iron, [place]   Encounter Date: 08/22/2020   PT End of Session - 08/22/20 0826    Visit Number 20    Number of Visits 28    Date for PT Re-Evaluation 09/22/20    Authorization Type FOTO; Progress note every 10th visit    PT Start Time 0815    PT Stop Time 0904    PT Time Calculation (min) 49 min    Activity Tolerance Patient tolerated treatment well    Behavior During Therapy [facility] for tasks assessed/performed           Past Medical History:  Diagnosis Date  . Arthritis    right hand  . Dyslipidemia   . Family history of anesthesia complication    patients mother has severe nausea and vomiting after  . Fibromyalgia   . GERD (gastroesophageal reflux disease)   . Guillain-Barre syndrome ([place])   . Hiatal hernia   . History of kidney stones   . Kidney stones   . Migraines    one/month  . PONV (postoperative nausea and vomiting)   . TMJ (dislocation of temporomandibular joint)   . UTI (urinary tract infection)    hx of  . Vitamin D deficiency     Past Surgical History:  Procedure Laterality Date  . ABDOMINAL HYSTERECTOMY    . ADNOIDS    . ANTERIOR CERVICAL DECOMP/DISCECTOMY FUSION N/A 10/27/2013   Procedure: ACDF/ANTERIOR CERVICAL DECOMPRESSION/DISCECTOMY FUSION  C5-C7  (2 LEVELS);  Surgeon: Melina Schools, MD;  Location: [place];  Service: Orthopedics;  Laterality: N/A;  . COLONOSCOPY  05/26/2020  . EYE SURGERY Bilateral    cataract removal  .  KNEE ARTHROSCOPY Right   . TOTAL KNEE ARTHROPLASTY Right 06/30/2020   Procedure: TOTAL KNEE ARTHROPLASTY;  Surgeon: Netta Cedars, MD;  [facility];  Service: Orthopedics;  Laterality: Right;  . UPPER GASTROINTESTINAL ENDOSCOPY  05/26/2020    There were no vitals filed for this visit.   Subjective Assessment - 08/22/20 0824    Subjective COVID-19 screening performed upon arrival. Patient reports having a long day at work with a lot of walking but no pain in the knee just soreness in calf and thigh muscles. States she had a difficult time getting comfortable trying to sleep.    Pertinent History right TKA 06/30/2020; Fibromyalgia, migranes, anterior cervical decompression fusion 10/2013    Limitations Sitting;Standing;Walking;House hold activities    Diagnostic tests x-ray: inflammation    Patient Stated Goals get back to normal    Currently in Pain? Yes   "sore" did not provide number on pain scale             [facility] PT Assessment - 08/22/20 0001      Assessment   Medical Diagnosis Pain in right knee    Referring Provider (PT) Shelle Iron, PA-C    Onset Date/Surgical Date 06/30/20    Next MD Visit 09/14/2020    Prior Therapy yes      Precautions   Precautions Other (comment)    Precaution  Comments no ultrasound      Strength   Right Knee Flexion 4/5    Right Knee Extension 4/5                         [facility] Adult PT Treatment/Exercise - 08/22/20 0001      Exercises   Exercises Knee/Hip      Knee/Hip Exercises: Aerobic   Recumbent Bike Level 4 Seat 3 x15 mins      Knee/Hip Exercises: Machines for Strengthening   Cybex Leg Press --      Knee/Hip Exercises: Standing   Rocker Board 3 minutes      Knee/Hip Exercises: Seated   Long Arc Quad Right;Weights;15 reps    Long Arc Quad Weight 4 lbs.    Sit to Sand without UE support;10 reps   5# bilateral weights     Modalities   Modalities Vasopneumatic      Vasopneumatic   Number Minutes Vasopneumatic   10 minutes    Vasopnuematic Location  Knee    Vasopneumatic Pressure Medium    Vasopneumatic Temperature  34                       PT Long Term Goals - 08/22/20 0913      PT LONG TERM GOAL #1   Title Patient will be independent with HEP    Time 4    Period Weeks    Status Achieved      PT LONG TERM GOAL #2   Title Patient will demonstrate 5 degrees or less of right knee extension AROM to improve gait mechanics.    Time 4    Period Weeks    Status Achieved      PT LONG TERM GOAL #3   Title Patient will demonstrate 115+ degrees of right knee flexion AROM to improve functional tasks.    Time 4    Period Weeks    Status Achieved      PT LONG TERM GOAL #4   Title Patient will demonstrate 4+/5 or greater right knee MMT in all planes to improve stability during functional tasks.    Time 4    Period Weeks    Status Revised      PT LONG TERM GOAL #5   Title Patient will report ability to perform ADLs, home activities, and work activites with right knee pain less than or equal to 3/10.    Time 4    Period Weeks    Status Achieved                 Plan - 08/22/20 0841    Clinical Impression Statement Patient responded well to therapy session though sore secondary to doing a lot a walking yesterday. Patient guided through strengthening and stetching with good technique. Patient and PT discussed soreness as a normal process of strengthening to which she reported understanding. Patient is making progress toward strength goal and patient and PT discussed building endurance for long distance walking. Normal response to modalities upon removal.    Personal Factors and Comorbidities Age;Time since onset of injury/illness/exacerbation    Examination-Activity Limitations Locomotion Level;Transfers;Stairs;Stand    Examination-Participation Restrictions Cleaning;Occupation    Stability/Clinical Decision Making Stable/Uncomplicated    Clinical Decision Making Low    Rehab  Potential Good    PT Frequency 3x / week    PT Duration 4 weeks    PT Treatment/Interventions ADLs/Self Care Home Management;Iontophoresis  4mg /ml Dexamethasone;Gait training;Stair training;Functional mobility training;Therapeutic activities;Electrical Stimulation;Therapeutic exercise;Balance training;Neuromuscular re-education;Manual techniques;Passive range of motion;Patient/family education;Vasopneumatic Device;Moist Heat    PT Next Visit Plan Bike, continue with functional strengthening and balance    Consulted and Agree with Plan of Care Patient           Patient will benefit from skilled therapeutic intervention in order to improve the following deficits and impairments:  Difficulty walking,Decreased balance,Decreased activity tolerance,Decreased strength,Increased edema,Pain,Decreased range of motion,Abnormal gait  Visit Diagnosis: Acute pain of right knee  Muscle weakness (generalized)  Difficulty in walking, not elsewhere classified  Localized edema     Problem List Patient Active Problem List   Diagnosis Date Noted  . Rectal bleeding 05/10/2020  . Pain management contract agreement 05/16/2016  . Insomnia 04/13/2015  . BMI 26.0-26.9,adult 04/13/2015  . Chronic nausea 10/12/2014  . GAD (generalized anxiety disorder) 05/25/2014  . Hyperlipidemia 11/04/2012  . Migraines 11/04/2012  . GERD (gastroesophageal reflux disease) 11/04/2012  . Fibromyalgia 11/04/2012  . TMJ arthropathy 11/04/2012    Gabriela Eves, PT, DPT 08/22/2020, 10:38 AM  [facility] [address] [place], [place], [address] Phone: [phone]   Fax:  [phone]  Name: CHERYLEE RAWLINSON MRN: [id number] Date of Birth: 07-10-1960

## 2020-08-23 ENCOUNTER — Encounter: Payer: 59 | Admitting: Physical Therapy

## 2020-08-25 ENCOUNTER — Other Ambulatory Visit: Payer: Self-pay

## 2020-08-25 ENCOUNTER — Ambulatory Visit: Payer: 59 | Admitting: Physical Therapy

## 2020-08-25 ENCOUNTER — Encounter: Payer: 59 | Admitting: Physical Therapy

## 2020-08-25 DIAGNOSIS — R600 Localized edema: Secondary | ICD-10-CM

## 2020-08-25 DIAGNOSIS — M25561 Pain in right knee: Secondary | ICD-10-CM

## 2020-08-25 DIAGNOSIS — M6281 Muscle weakness (generalized): Secondary | ICD-10-CM

## 2020-08-25 DIAGNOSIS — R262 Difficulty in walking, not elsewhere classified: Secondary | ICD-10-CM

## 2020-08-25 NOTE — Therapy (Signed)
[facility] [place], [place], [address] Phone: [phone]   Fax:  [phone]  Physical Therapy Treatment  Patient Details  Name: [name] MRN: [id number] Date of Birth: Jul 26, 1960 Referring Provider (PT): Shelle Iron, [place]   Encounter Date: 08/25/2020   PT End of Session - 08/25/20 1156    Visit Number 21    Number of Visits 28    Date for PT Re-Evaluation 09/22/20    Authorization Type FOTO; Progress note every 10th visit    PT Start Time 1115    PT Stop Time 1208    PT Time Calculation (min) 53 min    Activity Tolerance Patient tolerated treatment well    Behavior During Therapy [facility] for tasks assessed/performed           Past Medical History:  Diagnosis Date  . Arthritis    right hand  . Dyslipidemia   . Family history of anesthesia complication    patients mother has severe nausea and vomiting after  . Fibromyalgia   . GERD (gastroesophageal reflux disease)   . Guillain-Barre syndrome ([place])   . Hiatal hernia   . History of kidney stones   . Kidney stones   . Migraines    one/month  . PONV (postoperative nausea and vomiting)   . TMJ (dislocation of temporomandibular joint)   . UTI (urinary tract infection)    hx of  . Vitamin D deficiency     Past Surgical History:  Procedure Laterality Date  . ABDOMINAL HYSTERECTOMY    . ADNOIDS    . ANTERIOR CERVICAL DECOMP/DISCECTOMY FUSION N/A 10/27/2013   Procedure: ACDF/ANTERIOR CERVICAL DECOMPRESSION/DISCECTOMY FUSION  C5-C7  (2 LEVELS);  Surgeon: Melina Schools, MD;  Location: [place];  Service: Orthopedics;  Laterality: N/A;  . COLONOSCOPY  05/26/2020  . EYE SURGERY Bilateral    cataract removal  . KNEE ARTHROSCOPY Right   . TOTAL KNEE ARTHROPLASTY Right 06/30/2020   Procedure: TOTAL KNEE ARTHROPLASTY;  Surgeon: Netta Cedars, MD;  [facility];  Service: Orthopedics;  Laterality: Right;  . UPPER GASTROINTESTINAL ENDOSCOPY  05/26/2020    There  were no vitals filed for this visit.   Subjective Assessment - 08/25/20 1202    Subjective COVID-19 screening performed upon arrival. Patient reports having a difficult week since returning to work.    Pertinent History right TKA 06/30/2020; Fibromyalgia, migranes, anterior cervical decompression fusion 10/2013    Limitations Sitting;Standing;Walking;House hold activities    Diagnostic tests x-ray: inflammation    Patient Stated Goals get back to normal    Currently in Pain? Yes   "not pain just discomfort"   Pain Location Knee    Pain Orientation Right    Pain Descriptors / Indicators Discomfort    Pain Type Surgical pain              [facility] PT Assessment - 08/25/20 0001      Assessment   Medical Diagnosis Pain in right knee    Referring Provider (PT) Shelle Iron, PA-C    Onset Date/Surgical Date 06/30/20    Next MD Visit 09/14/2020    Prior Therapy yes      Precautions   Precautions Other (comment)    Precaution Comments no ultrasound                         [facility] Adult PT Treatment/Exercise - 08/25/20 0001      Exercises   Exercises Knee/Hip  Knee/Hip Exercises: Aerobic   Recumbent Bike Level 4 Seat 3 x15 mins      Knee/Hip Exercises: Standing   Rocker Board 3 minutes      Knee/Hip Exercises: Seated   Long Arc Quad Right;20 reps;Weights    Long Arc Quad Weight 4 lbs.      Modalities   Modalities Vasopneumatic      Vasopneumatic   Number Minutes Vasopneumatic  15 minutes    Vasopnuematic Location  Knee    Vasopneumatic Pressure Medium    Vasopneumatic Temperature  34      Manual Therapy   Soft tissue mobilization STW/M to distal ITB to decrease tone and pain                       PT Long Term Goals - 08/22/20 0913      PT LONG TERM GOAL #1   Title Patient will be independent with HEP    Time 4    Period Weeks    Status Achieved      PT LONG TERM GOAL #2   Title Patient will demonstrate 5 degrees or less of right knee  extension AROM to improve gait mechanics.    Time 4    Period Weeks    Status Achieved      PT LONG TERM GOAL #3   Title Patient will demonstrate 115+ degrees of right knee flexion AROM to improve functional tasks.    Time 4    Period Weeks    Status Achieved      PT LONG TERM GOAL #4   Title Patient will demonstrate 4+/5 or greater right knee MMT in all planes to improve stability during functional tasks.    Time 4    Period Weeks    Status Revised      PT LONG TERM GOAL #5   Title Patient will report ability to perform ADLs, home activities, and work activites with right knee pain less than or equal to 3/10.    Time 4    Period Weeks    Status Achieved                 Plan - 08/25/20 1203    Clinical Impression Statement Patient responded fair with therapy session today. Patient require intermittent rest breaks on the bike secondary to tightness and discomfort. STW/M provided to ITB with excellent response. Patient and PT discussed talking with boss to reduce work load for slow return into work activities if possible. Patient reported understanding. No adverse affects upon removal of modalities.    Personal Factors and Comorbidities Age;Time since onset of injury/illness/exacerbation    Examination-Activity Limitations Locomotion Level;Transfers;Stairs;Stand    Examination-Participation Restrictions Cleaning;Occupation    Stability/Clinical Decision Making Stable/Uncomplicated    Clinical Decision Making Low    Rehab Potential Good    PT Frequency 3x / week    PT Duration 4 weeks    PT Treatment/Interventions ADLs/Self Care Home Management;Iontophoresis 4mg /ml Dexamethasone;Gait training;Stair training;Functional mobility training;Therapeutic activities;Electrical Stimulation;Therapeutic exercise;Balance training;Neuromuscular re-education;Manual techniques;Passive range of motion;Patient/family education;Vasopneumatic Device;Moist Heat    PT Next Visit Plan Bike,  continue with functional strengthening and balance    PT Home Exercise Plan ITB STW/M    Consulted and Agree with Plan of Care Patient           Patient will benefit from skilled therapeutic intervention in order to improve the following deficits and impairments:  Difficulty walking,Decreased balance,Decreased activity tolerance,Decreased strength,Increased edema,Pain,Decreased  range of motion,Abnormal gait  Visit Diagnosis: Acute pain of right knee  Muscle weakness (generalized)  Difficulty in walking, not elsewhere classified  Localized edema     Problem List Patient Active Problem List   Diagnosis Date Noted  . Rectal bleeding 05/10/2020  . Pain management contract agreement 05/16/2016  . Insomnia 04/13/2015  . BMI 26.0-26.9,adult 04/13/2015  . Chronic nausea 10/12/2014  . GAD (generalized anxiety disorder) 05/25/2014  . Hyperlipidemia 11/04/2012  . Migraines 11/04/2012  . GERD (gastroesophageal reflux disease) 11/04/2012  . Fibromyalgia 11/04/2012  . TMJ arthropathy 11/04/2012    Gabriela Eves, PT, DPT 08/25/2020, 12:17 PM  [facility] [address] [place], [place], [address] Phone: [phone]   Fax:  [phone]  Name: [name] MRN: [id number] Date of Birth: March 11, 1961

## 2020-08-28 ENCOUNTER — Encounter: Payer: 59 | Admitting: Physical Therapy

## 2020-08-29 ENCOUNTER — Ambulatory Visit: Payer: 59 | Admitting: Physical Therapy

## 2020-08-29 ENCOUNTER — Other Ambulatory Visit: Payer: Self-pay

## 2020-08-29 DIAGNOSIS — R600 Localized edema: Secondary | ICD-10-CM

## 2020-08-29 DIAGNOSIS — M25561 Pain in right knee: Secondary | ICD-10-CM

## 2020-08-29 DIAGNOSIS — M6281 Muscle weakness (generalized): Secondary | ICD-10-CM

## 2020-08-29 DIAGNOSIS — R262 Difficulty in walking, not elsewhere classified: Secondary | ICD-10-CM

## 2020-08-29 NOTE — Therapy (Signed)
[facility] [place], [place], [address] Phone: [phone]   Fax:  [phone]  Physical Therapy Treatment  Patient Details  Name: [name] MRN: [id number] Date of Birth: 07-Sep-1960 Referring Provider (PT): Shelle Iron, [place]   Encounter Date: 08/29/2020   PT End of Session - 08/29/20 9242    Visit Number 22    Number of Visits 28    Date for PT Re-Evaluation 09/22/20    Authorization Type FOTO; Progress note every 10th visit    PT Start Time 1515    PT Stop Time 1604    PT Time Calculation (min) 49 min    Activity Tolerance Patient tolerated treatment well    Behavior During Therapy [facility] for tasks assessed/performed           Past Medical History:  Diagnosis Date  . Arthritis    right hand  . Dyslipidemia   . Family history of anesthesia complication    patients mother has severe nausea and vomiting after  . Fibromyalgia   . GERD (gastroesophageal reflux disease)   . Guillain-Barre syndrome ([place])   . Hiatal hernia   . History of kidney stones   . Kidney stones   . Migraines    one/month  . PONV (postoperative nausea and vomiting)   . TMJ (dislocation of temporomandibular joint)   . UTI (urinary tract infection)    hx of  . Vitamin D deficiency     Past Surgical History:  Procedure Laterality Date  . ABDOMINAL HYSTERECTOMY    . ADNOIDS    . ANTERIOR CERVICAL DECOMP/DISCECTOMY FUSION N/A 10/27/2013   Procedure: ACDF/ANTERIOR CERVICAL DECOMPRESSION/DISCECTOMY FUSION  C5-C7  (2 LEVELS);  Surgeon: Melina Schools, MD;  Location: [place];  Service: Orthopedics;  Laterality: N/A;  . COLONOSCOPY  05/26/2020  . EYE SURGERY Bilateral    cataract removal  . KNEE ARTHROSCOPY Right   . TOTAL KNEE ARTHROPLASTY Right 06/30/2020   Procedure: TOTAL KNEE ARTHROPLASTY;  Surgeon: Netta Cedars, MD;  [facility];  Service: Orthopedics;  Laterality: Right;  . UPPER GASTROINTESTINAL ENDOSCOPY  05/26/2020    There  were no vitals filed for this visit.   Subjective Assessment - 08/29/20 1608    Subjective COVID-19 screening performed upon arrival. Patient arrived with just fatigue and soreness.    Pertinent History right TKA 06/30/2020; Fibromyalgia, migranes, anterior cervical decompression fusion 10/2013    Limitations Sitting;Standing;Walking;House hold activities    Diagnostic tests x-ray: inflammation    Patient Stated Goals get back to normal    Currently in Pain? Yes   did not provide number on pain scale             [facility] PT Assessment - 08/29/20 0001      Assessment   Medical Diagnosis Pain in right knee    Referring Provider (PT) Shelle Iron, PA-C    Onset Date/Surgical Date 06/30/20    Next MD Visit 09/14/2020    Prior Therapy yes      Precautions   Precautions Other (comment)    Precaution Comments no ultrasound                         [facility] Adult PT Treatment/Exercise - 08/29/20 0001      Exercises   Exercises Knee/Hip      Knee/Hip Exercises: Aerobic   Recumbent Bike Level 3 Seat 3 x15 mins      Knee/Hip Exercises: Machines for  Strengthening   Cybex Knee Flexion 20# 2x10    Cybex Leg Press plate 2 M19      Knee/Hip Exercises: Seated   Long Arc Quad Right;20 reps;Weights    Long Arc Quad Weight 4 lbs.    Clamshell with TheraBand Green   single leg x10 bilaterally   Sit to Sand without UE support;10 reps   x10 staggered stance x10 with green theraband     Modalities   Modalities Vasopneumatic      Vasopneumatic   Number Minutes Vasopneumatic  10 minutes    Vasopnuematic Location  Knee    Vasopneumatic Pressure Medium    Vasopneumatic Temperature  34                       PT Long Term Goals - 08/22/20 0913      PT LONG TERM GOAL #1   Title Patient will be independent with HEP    Time 4    Period Weeks    Status Achieved      PT LONG TERM GOAL #2   Title Patient will demonstrate 5 degrees or less of right knee extension AROM to  improve gait mechanics.    Time 4    Period Weeks    Status Achieved      PT LONG TERM GOAL #3   Title Patient will demonstrate 115+ degrees of right knee flexion AROM to improve functional tasks.    Time 4    Period Weeks    Status Achieved      PT LONG TERM GOAL #4   Title Patient will demonstrate 4+/5 or greater right knee MMT in all planes to improve stability during functional tasks.    Time 4    Period Weeks    Status Revised      PT LONG TERM GOAL #5   Title Patient will report ability to perform ADLs, home activities, and work activites with right knee pain less than or equal to 3/10.    Time 4    Period Weeks    Status Achieved                 Plan - 08/29/20 1604    Clinical Impression Statement Patient arrived doing well but fatigued. Patient provided with intermittent rest  breaks throughout session. Patient was able to progress with weight with leg press with minimal complaints. Patient and PT discussed it takes time to build strength and endurance and as long as she continues to make gains and progress either weight or time/reps she is building muscular endurance. No adverse affects upon removal of modalities.    Personal Factors and Comorbidities Age;Time since onset of injury/illness/exacerbation    Examination-Activity Limitations Locomotion Level;Transfers;Stairs;Stand    Examination-Participation Restrictions Cleaning;Occupation    Stability/Clinical Decision Making Stable/Uncomplicated    Clinical Decision Making Low    Rehab Potential Good    PT Frequency 3x / week    PT Duration 4 weeks    PT Treatment/Interventions ADLs/Self Care Home Management;Iontophoresis 4mg /ml Dexamethasone;Gait training;Stair training;Functional mobility training;Therapeutic activities;Electrical Stimulation;Therapeutic exercise;Balance training;Neuromuscular re-education;Manual techniques;Passive range of motion;Patient/family education;Vasopneumatic Device;Moist Heat    PT  Next Visit Plan Bike, continue with functional strengthening and balance    PT Home Exercise Plan ITB STW/M    Consulted and Agree with Plan of Care Patient           Patient will benefit from skilled therapeutic intervention in order to improve the following deficits  and impairments:  Difficulty walking,Decreased balance,Decreased activity tolerance,Decreased strength,Increased edema,Pain,Decreased range of motion,Abnormal gait  Visit Diagnosis: Acute pain of right knee  Muscle weakness (generalized)  Difficulty in walking, not elsewhere classified  Localized edema     Problem List Patient Active Problem List   Diagnosis Date Noted  . Rectal bleeding 05/10/2020  . Pain management contract agreement 05/16/2016  . Insomnia 04/13/2015  . BMI 26.0-26.9,adult 04/13/2015  . Chronic nausea 10/12/2014  . GAD (generalized anxiety disorder) 05/25/2014  . Hyperlipidemia 11/04/2012  . Migraines 11/04/2012  . GERD (gastroesophageal reflux disease) 11/04/2012  . Fibromyalgia 11/04/2012  . TMJ arthropathy 11/04/2012    Gabriela Eves, PT, DPT 08/29/2020, 4:09 PM  [facility] [address] [place], [place], [address] Phone: [phone]   Fax:  [phone]  Name: [name] MRN: [id number] Date of Birth: Feb 09, 1961

## 2020-08-30 ENCOUNTER — Encounter: Payer: 59 | Admitting: Physical Therapy

## 2020-09-01 ENCOUNTER — Other Ambulatory Visit: Payer: Self-pay

## 2020-09-01 ENCOUNTER — Ambulatory Visit: Payer: 59 | Admitting: Physical Therapy

## 2020-09-01 ENCOUNTER — Encounter: Payer: 59 | Admitting: Physical Therapy

## 2020-09-01 ENCOUNTER — Encounter: Payer: Self-pay | Admitting: Physical Therapy

## 2020-09-01 DIAGNOSIS — R262 Difficulty in walking, not elsewhere classified: Secondary | ICD-10-CM

## 2020-09-01 DIAGNOSIS — M25561 Pain in right knee: Secondary | ICD-10-CM | POA: Diagnosis not present

## 2020-09-01 DIAGNOSIS — M6281 Muscle weakness (generalized): Secondary | ICD-10-CM

## 2020-09-01 DIAGNOSIS — R600 Localized edema: Secondary | ICD-10-CM

## 2020-09-01 NOTE — Therapy (Signed)
[facility] [place], [place], [address] Phone: [phone]   Fax:  [phone]  Physical Therapy Treatment  Patient Details  Name: [name] MRN: [id number] Date of Birth: 09/15/1960 Referring Provider (PT): Shelle Iron, [place]   Encounter Date: 09/01/2020   PT End of Session - 09/01/20 0853    Visit Number 23    Number of Visits 28    Date for PT Re-Evaluation 09/22/20    Authorization Type FOTO; Progress note every 10th visit    PT Start Time 0815    PT Stop Time 0855    PT Time Calculation (min) 40 min    Activity Tolerance Patient tolerated treatment well    Behavior During Therapy [facility] for tasks assessed/performed           Past Medical History:  Diagnosis Date  . Arthritis    right hand  . Dyslipidemia   . Family history of anesthesia complication    patients mother has severe nausea and vomiting after  . Fibromyalgia   . GERD (gastroesophageal reflux disease)   . Guillain-Barre syndrome ([place])   . Hiatal hernia   . History of kidney stones   . Kidney stones   . Migraines    one/month  . PONV (postoperative nausea and vomiting)   . TMJ (dislocation of temporomandibular joint)   . UTI (urinary tract infection)    hx of  . Vitamin D deficiency     Past Surgical History:  Procedure Laterality Date  . ABDOMINAL HYSTERECTOMY    . ADNOIDS    . ANTERIOR CERVICAL DECOMP/DISCECTOMY FUSION N/A 10/27/2013   Procedure: ACDF/ANTERIOR CERVICAL DECOMPRESSION/DISCECTOMY FUSION  C5-C7  (2 LEVELS);  Surgeon: Melina Schools, MD;  Location: [place];  Service: Orthopedics;  Laterality: N/A;  . COLONOSCOPY  05/26/2020  . EYE SURGERY Bilateral    cataract removal  . KNEE ARTHROSCOPY Right   . TOTAL KNEE ARTHROPLASTY Right 06/30/2020   Procedure: TOTAL KNEE ARTHROPLASTY;  Surgeon: Netta Cedars, MD;  [facility];  Service: Orthopedics;  Laterality: Right;  . UPPER GASTROINTESTINAL ENDOSCOPY  05/26/2020    There  were no vitals filed for this visit.   Subjective Assessment - 09/01/20 0848    Subjective COVID-19 screening performed upon arrival. Patient arrived left hip and knee soreness.    Pertinent History right TKA 06/30/2020; Fibromyalgia, migranes, anterior cervical decompression fusion 10/2013    Limitations Sitting;Standing;Walking;House hold activities    Diagnostic tests x-ray: inflammation    Patient Stated Goals get back to normal    Currently in Pain? Yes              [facility] PT Assessment - 09/01/20 0001      Assessment   Medical Diagnosis Pain in right knee    Referring Provider (PT) Shelle Iron, PA-C    Onset Date/Surgical Date 06/30/20    Next MD Visit 09/14/2020    Prior Therapy yes      Precautions   Precautions Other (comment)    Precaution Comments no ultrasound                         [facility] Adult PT Treatment/Exercise - 09/01/20 0001      Knee/Hip Exercises: Aerobic   Recumbent Bike Level 3 Seat 3 x15 mins      Knee/Hip Exercises: Standing   Rocker Board 3 minutes    Walking with Sports Cord resisted walking blue XTS x5 reps  4 directions      Modalities   Modalities Vasopneumatic      Vasopneumatic   Number Minutes Vasopneumatic  10 minutes    Vasopnuematic Location  Knee    Vasopneumatic Pressure Medium    Vasopneumatic Temperature  34      Manual Therapy   Manual therapy comments patella joint mobs in all planes to improve superior/inferior mobility    Edema Management edema massage distal to proximal for right knee to decrease swelling    Passive ROM PROM into knee extension and flexion with gentle holds                       PT Long Term Goals - 08/22/20 0913      PT LONG TERM GOAL #1   Title Patient will be independent with HEP    Time 4    Period Weeks    Status Achieved      PT LONG TERM GOAL #2   Title Patient will demonstrate 5 degrees or less of right knee extension AROM to improve gait mechanics.    Time 4     Period Weeks    Status Achieved      PT LONG TERM GOAL #3   Title Patient will demonstrate 115+ degrees of right knee flexion AROM to improve functional tasks.    Time 4    Period Weeks    Status Achieved      PT LONG TERM GOAL #4   Title Patient will demonstrate 4+/5 or greater right knee MMT in all planes to improve stability during functional tasks.    Time 4    Period Weeks    Status Revised      PT LONG TERM GOAL #5   Title Patient will report ability to perform ADLs, home activities, and work activites with right knee pain less than or equal to 3/10.    Time 4    Period Weeks    Status Achieved                 Plan - 09/01/20 0853    Clinical Impression Statement Patient arrived to physical therapy with more soreness in left hip and knee but was able to complete treatment. Patient and PT discussed reducing speed during bike as well to limit LE soreness and fatigue. Resisted walking initiated today with excellent response. No adverse affects upon removal.    Personal Factors and Comorbidities Age;Time since onset of injury/illness/exacerbation    Examination-Activity Limitations Locomotion Level;Transfers;Stairs;Stand    Examination-Participation Restrictions Cleaning;Occupation    Stability/Clinical Decision Making Stable/Uncomplicated    Clinical Decision Making Low    Rehab Potential Good    PT Frequency 3x / week    PT Duration 4 weeks    PT Treatment/Interventions ADLs/Self Care Home Management;Iontophoresis 4mg /ml Dexamethasone;Gait training;Stair training;Functional mobility training;Therapeutic activities;Electrical Stimulation;Therapeutic exercise;Balance training;Neuromuscular re-education;Manual techniques;Passive range of motion;Patient/family education;Vasopneumatic Device;Moist Heat    PT Next Visit Plan Bike, continue with functional strengthening and balance    PT Home Exercise Plan ITB STW/M    Consulted and Agree with Plan of Care Patient            Patient will benefit from skilled therapeutic intervention in order to improve the following deficits and impairments:  Difficulty walking,Decreased balance,Decreased activity tolerance,Decreased strength,Increased edema,Pain,Decreased range of motion,Abnormal gait  Visit Diagnosis: Acute pain of right knee  Muscle weakness (generalized)  Difficulty in walking, not elsewhere classified  Localized edema  Problem List Patient Active Problem List   Diagnosis Date Noted  . Rectal bleeding 05/10/2020  . Pain management contract agreement 05/16/2016  . Insomnia 04/13/2015  . BMI 26.0-26.9,adult 04/13/2015  . Chronic nausea 10/12/2014  . GAD (generalized anxiety disorder) 05/25/2014  . Hyperlipidemia 11/04/2012  . Migraines 11/04/2012  . GERD (gastroesophageal reflux disease) 11/04/2012  . Fibromyalgia 11/04/2012  . TMJ arthropathy 11/04/2012    Gabriela Eves, PT, DPT 09/01/2020, 9:14 AM  [facility] [address] [place], [place], [address] Phone: [phone]   Fax:  [phone]  Name: [name] MRN: [id number] Date of Birth: [birth year]

## 2020-09-04 ENCOUNTER — Encounter: Payer: 59 | Admitting: Physical Therapy

## 2020-09-05 ENCOUNTER — Ambulatory Visit: Payer: 59 | Admitting: Physical Therapy

## 2020-09-05 ENCOUNTER — Other Ambulatory Visit: Payer: Self-pay

## 2020-09-05 DIAGNOSIS — R262 Difficulty in walking, not elsewhere classified: Secondary | ICD-10-CM

## 2020-09-05 DIAGNOSIS — R600 Localized edema: Secondary | ICD-10-CM

## 2020-09-05 DIAGNOSIS — M6281 Muscle weakness (generalized): Secondary | ICD-10-CM

## 2020-09-05 DIAGNOSIS — M25561 Pain in right knee: Secondary | ICD-10-CM | POA: Diagnosis not present

## 2020-09-05 NOTE — Therapy (Addendum)
[facility] [place], [place], [address] Phone: [phone]   Fax:  [phone]  Physical Therapy Treatment  Patient Details  Name: [name] MRN: [id number] Date of Birth: 28-Jan-1961 Referring Provider (PT): Shelle Iron, [place]   Encounter Date: 09/05/2020   PT End of Session - 09/05/20 0838    Visit Number 24    Number of Visits 28    Date for PT Re-Evaluation 09/22/20    Authorization Type FOTO; Progress note every 10th visit    PT Start Time 0815    PT Stop Time 0901    PT Time Calculation (min) 46 min    Activity Tolerance Patient tolerated treatment well    Behavior During Therapy [facility] for tasks assessed/performed           Past Medical History:  Diagnosis Date  . Arthritis    right hand  . Dyslipidemia   . Family history of anesthesia complication    patients mother has severe nausea and vomiting after  . Fibromyalgia   . GERD (gastroesophageal reflux disease)   . Guillain-Barre syndrome ([place])   . Hiatal hernia   . History of kidney stones   . Kidney stones   . Migraines    one/month  . PONV (postoperative nausea and vomiting)   . TMJ (dislocation of temporomandibular joint)   . UTI (urinary tract infection)    hx of  . Vitamin D deficiency     Past Surgical History:  Procedure Laterality Date  . ABDOMINAL HYSTERECTOMY    . ADNOIDS    . ANTERIOR CERVICAL DECOMP/DISCECTOMY FUSION N/A 10/27/2013   Procedure: ACDF/ANTERIOR CERVICAL DECOMPRESSION/DISCECTOMY FUSION  C5-C7  (2 LEVELS);  Surgeon: Melina Schools, MD;  Location: [facility];  Service: Orthopedics;  Laterality: N/A;  . COLONOSCOPY  05/26/2020  . EYE SURGERY Bilateral    cataract removal  . KNEE ARTHROSCOPY Right   . TOTAL KNEE ARTHROPLASTY Right 06/30/2020   Procedure: TOTAL KNEE ARTHROPLASTY;  Surgeon: Netta Cedars, MD;  Location[facility];  Service: Orthopedics;  Laterality: Right;  . UPPER GASTROINTESTINAL ENDOSCOPY  05/26/2020    There  were no vitals filed for this visit.   Subjective Assessment - 09/05/20 0818    Subjective COVID-19 screening performed upon arrival. Patient feeling fine discomfort in R knee, worst thing is pain is waking her up at 4 in the morning.    Pertinent History right TKA 06/30/2020; Fibromyalgia, migranes, anterior cervical decompression fusion 10/2013    Limitations Sitting;Standing;Walking;House hold activities    Diagnostic tests x-ray: inflammation    Patient Stated Goals get back to normal    Currently in Pain? No/denies              [facility] PT Assessment - 09/05/20 0001      Assessment   Medical Diagnosis Pain in right knee    Referring Provider (PT) Shelle Iron, PA-C    Onset Date/Surgical Date 06/30/20    Next MD Visit 09/14/2020    Prior Therapy yes      Precautions   Precautions Other (comment)    Precaution Comments no ultrasound                         [facility] Adult PT Treatment/Exercise - 09/05/20 0001      Therapeutic Activites    Therapeutic Activities Lifting    Lifting 20# box lift from low plinth to floor x5      Exercises  Exercises Knee/Hip      Knee/Hip Exercises: Aerobic   Recumbent Bike Level 3 Seat 3 x10 mins      Knee/Hip Exercises: Machines for Strengthening   Cybex Leg Press 1.5 plates 2x10      Knee/Hip Exercises: Standing   Forward Lunges Both;2 sets;10 reps;3 seconds    Forward Lunges Limitations on 14" box    Other Standing Knee Exercises lateral stepping x3 minutes      Modalities   Modalities Vasopneumatic      Vasopneumatic   Number Minutes Vasopneumatic  10 minutes    Vasopnuematic Location  Knee    Vasopneumatic Pressure Medium    Vasopneumatic Temperature  34                       PT Long Term Goals - 08/22/20 0913      PT LONG TERM GOAL #1   Title Patient will be independent with HEP    Time 4    Period Weeks    Status Achieved      PT LONG TERM GOAL #2   Title Patient will demonstrate 5 degrees  or less of right knee extension AROM to improve gait mechanics.    Time 4    Period Weeks    Status Achieved      PT LONG TERM GOAL #3   Title Patient will demonstrate 115+ degrees of right knee flexion AROM to improve functional tasks.    Time 4    Period Weeks    Status Achieved      PT LONG TERM GOAL #4   Title Patient will demonstrate 4+/5 or greater right knee MMT in all planes to improve stability during functional tasks.    Time 4    Period Weeks    Status Revised      PT LONG TERM GOAL #5   Title Patient will report ability to perform ADLs, home activities, and work activites with right knee pain less than or equal to 3/10.    Time 4    Period Weeks    Status Achieved                 Plan - 09/05/20 4081    Clinical Impression Statement Patient arrives with discomfort in L knee but overall doing fairly well. Patient with palpable click in inferio-lateral aspect of the right knee when performing leg press machine with some discomfort. Patient preferred to continue rather than terminate exercise. Patient and PT discussed body mechanics for lifting especially for 28# grandson. Vasopneumatic device performed with no adverse affects upon removal.    Personal Factors and Comorbidities Age;Time since onset of injury/illness/exacerbation    Examination-Activity Limitations Locomotion Level;Transfers;Stairs;Stand    Examination-Participation Restrictions Cleaning;Occupation    Stability/Clinical Decision Making Stable/Uncomplicated    Clinical Decision Making Low    Rehab Potential Good    PT Frequency 3x / week    PT Duration 4 weeks    PT Treatment/Interventions ADLs/Self Care Home Management;Iontophoresis 4mg /ml Dexamethasone;Gait training;Stair training;Functional mobility training;Therapeutic activities;Electrical Stimulation;Therapeutic exercise;Balance training;Neuromuscular re-education;Manual techniques;Passive range of motion;Patient/family education;Vasopneumatic  Device;Moist Heat    PT Next Visit Plan Bike, continue with functional strengthening and balance    Consulted and Agree with Plan of Care Patient           Patient will benefit from skilled therapeutic intervention in order to improve the following deficits and impairments:  Difficulty walking,Decreased balance,Decreased activity tolerance,Decreased strength,Increased edema,Pain,Decreased range of motion,Abnormal  gait  Visit Diagnosis: Acute pain of right knee  Muscle weakness (generalized)  Difficulty in walking, not elsewhere classified  Localized edema     Problem List Patient Active Problem List   Diagnosis Date Noted  . Rectal bleeding 05/10/2020  . Pain management contract agreement 05/16/2016  . Insomnia 04/13/2015  . BMI 26.0-26.9,adult 04/13/2015  . Chronic nausea 10/12/2014  . GAD (generalized anxiety disorder) 05/25/2014  . Hyperlipidemia 11/04/2012  . Migraines 11/04/2012  . GERD (gastroesophageal reflux disease) 11/04/2012  . Fibromyalgia 11/04/2012  . TMJ arthropathy 11/04/2012    Gabriela Eves, PT, DPT 09/05/2020, 9:07 AM  [facility] [address] [place], [place], [address] Phone: [phone]   Fax:  [phone]  Name: [name] MRN: [id number] Date of Birth: 12-06-1960

## 2020-09-06 ENCOUNTER — Encounter: Payer: 59 | Admitting: Physical Therapy

## 2020-09-08 ENCOUNTER — Other Ambulatory Visit: Payer: Self-pay

## 2020-09-08 ENCOUNTER — Encounter: Payer: Self-pay | Admitting: Physical Therapy

## 2020-09-08 ENCOUNTER — Encounter: Payer: 59 | Admitting: Physical Therapy

## 2020-09-08 ENCOUNTER — Ambulatory Visit: Payer: 59 | Attending: Physician Assistant | Admitting: Physical Therapy

## 2020-09-08 DIAGNOSIS — M25561 Pain in right knee: Secondary | ICD-10-CM | POA: Insufficient documentation

## 2020-09-08 DIAGNOSIS — R600 Localized edema: Secondary | ICD-10-CM | POA: Diagnosis present

## 2020-09-08 DIAGNOSIS — R262 Difficulty in walking, not elsewhere classified: Secondary | ICD-10-CM | POA: Diagnosis present

## 2020-09-08 DIAGNOSIS — M6281 Muscle weakness (generalized): Secondary | ICD-10-CM | POA: Insufficient documentation

## 2020-09-08 NOTE — Therapy (Signed)
[facility] [place], [place], [address] Phone: [phone]   Fax:  [phone]  Physical Therapy Treatment  Patient Details  Name: [name] MRN: [id number] Date of Birth: Nov 17, 1960 Referring Provider (PT): Shelle Iron, [place]   Encounter Date: 09/08/2020   PT End of Session - 09/08/20 1217    Visit Number 25    Number of Visits 28    Date for PT Re-Evaluation 09/22/20    Authorization Type FOTO; Progress note every 10th visit    PT Start Time 1115    PT Stop Time 1201    PT Time Calculation (min) 46 min    Activity Tolerance Patient tolerated treatment well    Behavior During Therapy [facility] for tasks assessed/performed           Past Medical History:  Diagnosis Date  . Arthritis    right hand  . Dyslipidemia   . Family history of anesthesia complication    patients mother has severe nausea and vomiting after  . Fibromyalgia   . GERD (gastroesophageal reflux disease)   . Guillain-Barre syndrome ([place])   . Hiatal hernia   . History of kidney stones   . Kidney stones   . Migraines    one/month  . PONV (postoperative nausea and vomiting)   . TMJ (dislocation of temporomandibular joint)   . UTI (urinary tract infection)    hx of  . Vitamin D deficiency     Past Surgical History:  Procedure Laterality Date  . ABDOMINAL HYSTERECTOMY    . ADNOIDS    . ANTERIOR CERVICAL DECOMP/DISCECTOMY FUSION N/A 10/27/2013   Procedure: ACDF/ANTERIOR CERVICAL DECOMPRESSION/DISCECTOMY FUSION  C5-C7  (2 LEVELS);  Surgeon: Melina Schools, MD;  Location: [facility];  Service: Orthopedics;  Laterality: N/A;  . COLONOSCOPY  05/26/2020  . EYE SURGERY Bilateral    cataract removal  . KNEE ARTHROSCOPY Right   . TOTAL KNEE ARTHROPLASTY Right 06/30/2020   Procedure: TOTAL KNEE ARTHROPLASTY;  Surgeon: Netta Cedars, MD;  [facility];  Service: Orthopedics;  Laterality: Right;  . UPPER GASTROINTESTINAL ENDOSCOPY  05/26/2020    There  were no vitals filed for this visit.   Subjective Assessment - 09/08/20 1217    Subjective COVID-19 screening performed upon arrival. Patient reports feeling tired and global fatigue. Reports she was able to sleep more restfully last night.    Pertinent History right TKA 06/30/2020; Fibromyalgia, migranes, anterior cervical decompression fusion 10/2013    Limitations Sitting;Standing;Walking;House hold activities    Diagnostic tests x-ray: inflammation    Patient Stated Goals get back to normal    Currently in Pain? No/denies              [facility] PT Assessment - 09/08/20 0001      Assessment   Medical Diagnosis Pain in right knee    Referring Provider (PT) Shelle Iron, PA-C    Onset Date/Surgical Date 06/30/20    Next MD Visit 09/14/2020    Prior Therapy yes      AROM   Right Knee Flexion 144                         [facility] Adult PT Treatment/Exercise - 09/08/20 0001      Exercises   Exercises Knee/Hip      Knee/Hip Exercises: Aerobic   Recumbent Bike Level 3 Seat 3 x12 mins      Knee/Hip Exercises: Machines for Strengthening   Cybex  Knee Extension 10# x10    Cybex Knee Flexion 20# to fatigue ~30 reps    Cybex Leg Press 2 plates to  fatigue x2      Knee/Hip Exercises: Supine   Bridges AROM;10 reps      Knee/Hip Exercises: Sidelying   Hip ABduction AROM;Right;2 sets;10 reps    Clams right hip x20; reverse clams x20    Other Sidelying Knee/Hip Exercises R hip extension x20      Modalities   Modalities Vasopneumatic      Vasopneumatic   Number Minutes Vasopneumatic  10 minutes    Vasopnuematic Location  Knee    Vasopneumatic Pressure Medium    Vasopneumatic Temperature  34                       PT Long Term Goals - 08/22/20 0913      PT LONG TERM GOAL #1   Title Patient will be independent with HEP    Time 4    Period Weeks    Status Achieved      PT LONG TERM GOAL #2   Title Patient will demonstrate 5 degrees or less of right  knee extension AROM to improve gait mechanics.    Time 4    Period Weeks    Status Achieved      PT LONG TERM GOAL #3   Title Patient will demonstrate 115+ degrees of right knee flexion AROM to improve functional tasks.    Time 4    Period Weeks    Status Achieved      PT LONG TERM GOAL #4   Title Patient will demonstrate 4+/5 or greater right knee MMT in all planes to improve stability during functional tasks.    Time 4    Period Weeks    Status Revised      PT LONG TERM GOAL #5   Title Patient will report ability to perform ADLs, home activities, and work activites with right knee pain less than or equal to 3/10.    Time 4    Period Weeks    Status Achieved                 Plan - 09/08/20 1218    Clinical Impression Statement Patient was able to tolerate treatment well though fatigued. Patient was able to tolerate leg press to fatigue but reported some pain at the end of the repetitions. Hip exercises performed and reviewed for HEP with good form after demonstration and tactile cuing. No adverse affects upon removal of modalities.    Personal Factors and Comorbidities Age;Time since onset of injury/illness/exacerbation    Examination-Activity Limitations Locomotion Level;Transfers;Stairs;Stand    Examination-Participation Restrictions Cleaning;Occupation    Stability/Clinical Decision Making Stable/Uncomplicated    Clinical Decision Making Low    Rehab Potential Good    PT Frequency 3x / week    PT Duration 4 weeks    PT Treatment/Interventions ADLs/Self Care Home Management;Iontophoresis 4mg /ml Dexamethasone;Gait training;Stair training;Functional mobility training;Therapeutic activities;Electrical Stimulation;Therapeutic exercise;Balance training;Neuromuscular re-education;Manual techniques;Passive range of motion;Patient/family education;Vasopneumatic Device;Moist Heat    PT Next Visit Plan Bike, continue with functional strengthening and balance    PT Home Exercise  Plan ITB STW/M    Consulted and Agree with Plan of Care Patient           Patient will benefit from skilled therapeutic intervention in order to improve the following deficits and impairments:  Difficulty walking,Decreased balance,Decreased activity tolerance,Decreased strength,Increased edema,Pain,Decreased range of  motion,Abnormal gait  Visit Diagnosis: Acute pain of right knee  Muscle weakness (generalized)  Difficulty in walking, not elsewhere classified  Localized edema     Problem List Patient Active Problem List   Diagnosis Date Noted  . Rectal bleeding 05/10/2020  . Pain management contract agreement 05/16/2016  . Insomnia 04/13/2015  . BMI 26.0-26.9,adult 04/13/2015  . Chronic nausea 10/12/2014  . GAD (generalized anxiety disorder) 05/25/2014  . Hyperlipidemia 11/04/2012  . Migraines 11/04/2012  . GERD (gastroesophageal reflux disease) 11/04/2012  . Fibromyalgia 11/04/2012  . TMJ arthropathy 11/04/2012    Gabriela Eves, PT, DPT 09/08/2020, 12:23 PM  [facility] [address] [place], [place], [address] Phone: [phone]   Fax:  [phone]  Name: [name] MRN: [id number] Date of Birth: [birth year]

## 2020-09-11 ENCOUNTER — Encounter: Payer: 59 | Admitting: Physical Therapy

## 2020-09-12 ENCOUNTER — Ambulatory Visit: Payer: 59 | Admitting: Physical Therapy

## 2020-09-12 ENCOUNTER — Other Ambulatory Visit: Payer: Self-pay

## 2020-09-12 DIAGNOSIS — R600 Localized edema: Secondary | ICD-10-CM

## 2020-09-12 DIAGNOSIS — R262 Difficulty in walking, not elsewhere classified: Secondary | ICD-10-CM

## 2020-09-12 DIAGNOSIS — M6281 Muscle weakness (generalized): Secondary | ICD-10-CM

## 2020-09-12 DIAGNOSIS — M25561 Pain in right knee: Secondary | ICD-10-CM

## 2020-09-12 NOTE — Therapy (Signed)
[facility] [place], [place], [address] Phone: [phone]   Fax:  [phone]  Physical Therapy Treatment  Patient Details  Name: [name] MRN: [id number] Date of Birth: May 07, 1961 Referring Provider (PT): Shelle Iron, [place]   Encounter Date: 09/12/2020   PT End of Session - 09/12/20 1723    Visit Number 26    Number of Visits 28    Date for PT Re-Evaluation 09/22/20    Authorization Type FOTO; Progress note every 10th visit    PT Start Time 1600    PT Stop Time 1648    PT Time Calculation (min) 48 min    Activity Tolerance Patient tolerated treatment well    Behavior During Therapy [facility] for tasks assessed/performed           Past Medical History:  Diagnosis Date  . Arthritis    right hand  . Dyslipidemia   . Family history of anesthesia complication    patients mother has severe nausea and vomiting after  . Fibromyalgia   . GERD (gastroesophageal reflux disease)   . Guillain-Barre syndrome ([place])   . Hiatal hernia   . History of kidney stones   . Kidney stones   . Migraines    one/month  . PONV (postoperative nausea and vomiting)   . TMJ (dislocation of temporomandibular joint)   . UTI (urinary tract infection)    hx of  . Vitamin D deficiency     Past Surgical History:  Procedure Laterality Date  . ABDOMINAL HYSTERECTOMY    . ADNOIDS    . ANTERIOR CERVICAL DECOMP/DISCECTOMY FUSION N/A 10/27/2013   Procedure: ACDF/ANTERIOR CERVICAL DECOMPRESSION/DISCECTOMY FUSION  C5-C7  (2 LEVELS);  Surgeon: Melina Schools, MD;  Location: [facility];  Service: Orthopedics;  Laterality: N/A;  . COLONOSCOPY  05/26/2020  . EYE SURGERY Bilateral    cataract removal  . KNEE ARTHROSCOPY Right   . TOTAL KNEE ARTHROPLASTY Right 06/30/2020   Procedure: TOTAL KNEE ARTHROPLASTY;  Surgeon: Netta Cedars, MD;  [facility];  Service: Orthopedics;  Laterality: Right;  . UPPER GASTROINTESTINAL ENDOSCOPY  05/26/2020    There  were no vitals filed for this visit.   Subjective Assessment - 09/12/20 1719    Subjective COVID-19 screening performed upon arrival. Patient more bilateral groin and hip adductor pain and strain after last session. Patient also with more swelling in R knee as well.    Pertinent History right TKA 06/30/2020; Fibromyalgia, migranes, anterior cervical decompression fusion 10/2013    Limitations Sitting;Standing;Walking;House hold activities    Diagnostic tests x-ray: inflammation    Patient Stated Goals get back to normal    Currently in Pain? --   did not provide number on pain scale             [facility] PT Assessment - 09/12/20 0001      Assessment   Medical Diagnosis Pain in right knee    Referring Provider (PT) Shelle Iron, PA-C    Onset Date/Surgical Date 06/30/20    Next MD Visit 09/14/2020    Prior Therapy yes                         [facility] Adult PT Treatment/Exercise - 09/12/20 0001      Exercises   Exercises Knee/Hip      Knee/Hip Exercises: Aerobic   Recumbent Bike Level 3 Seat 4 x8 mins      Modalities   Modalities Vasopneumatic;Electrical Stimulation  Acupuncturist Location right knee    Chartered certified accountant IFC    Electrical Stimulation Parameters 80-150 hz x10 mins    Electrical Stimulation Goals Pain;Edema      Vasopneumatic   Number Minutes Vasopneumatic  10 minutes    Vasopnuematic Location  Knee    Vasopneumatic Pressure Medium    Vasopneumatic Temperature  34      Manual Therapy   Manual Therapy Soft tissue mobilization;Edema management    Edema Management edema massage distal to proximal for right knee to decrease swelling    Soft tissue mobilization STW/M to distal ITB and adductors to decrease tone and pain                       PT Long Term Goals - 08/22/20 0913      PT LONG TERM GOAL #1   Title Patient will be independent with HEP    Time 4    Period Weeks    Status  Achieved      PT LONG TERM GOAL #2   Title Patient will demonstrate 5 degrees or less of right knee extension AROM to improve gait mechanics.    Time 4    Period Weeks    Status Achieved      PT LONG TERM GOAL #3   Title Patient will demonstrate 115+ degrees of right knee flexion AROM to improve functional tasks.    Time 4    Period Weeks    Status Achieved      PT LONG TERM GOAL #4   Title Patient will demonstrate 4+/5 or greater right knee MMT in all planes to improve stability during functional tasks.    Time 4    Period Weeks    Status Revised      PT LONG TERM GOAL #5   Title Patient will report ability to perform ADLs, home activities, and work activites with right knee pain less than or equal to 3/10.    Time 4    Period Weeks    Status Achieved                 Plan - 09/12/20 1723    Clinical Impression Statement Patient arrived with more bilateral groin pain today. Patient was able to complete about 8 minutes on bike before exercise terminated. STW/M to R thigh, adductors and ITB performed with reports tenderness throughout. Edema massage performed as well to right knee to decrease swelling. IFC for edema control performed with vasopneumatic device with no adverse affects upon removal. FOTO and DC next visit.    Personal Factors and Comorbidities Age;Time since onset of injury/illness/exacerbation    Examination-Activity Limitations Locomotion Level;Transfers;Stairs;Stand    Examination-Participation Restrictions Cleaning;Occupation    Stability/Clinical Decision Making Stable/Uncomplicated    Clinical Decision Making Low    Rehab Potential Good    PT Frequency 3x / week    PT Duration 4 weeks    PT Treatment/Interventions ADLs/Self Care Home Management;Iontophoresis 4mg /ml Dexamethasone;Gait training;Stair training;Functional mobility training;Therapeutic activities;Electrical Stimulation;Therapeutic exercise;Balance training;Neuromuscular re-education;Manual  techniques;Passive range of motion;Patient/family education;Vasopneumatic Device;Moist Heat    PT Next Visit Plan FOTO and DC next visit    PT Home Exercise Plan ITB STW/M    Consulted and Agree with Plan of Care Patient           Patient will benefit from skilled therapeutic intervention in order to improve the following deficits and impairments:  Difficulty walking,Decreased balance,Decreased  activity tolerance,Decreased strength,Increased edema,Pain,Decreased range of motion,Abnormal gait  Visit Diagnosis: Acute pain of right knee  Muscle weakness (generalized)  Difficulty in walking, not elsewhere classified  Localized edema     Problem List Patient Active Problem List   Diagnosis Date Noted  . Rectal bleeding 05/10/2020  . Pain management contract agreement 05/16/2016  . Insomnia 04/13/2015  . BMI 26.0-26.9,adult 04/13/2015  . Chronic nausea 10/12/2014  . GAD (generalized anxiety disorder) 05/25/2014  . Hyperlipidemia 11/04/2012  . Migraines 11/04/2012  . GERD (gastroesophageal reflux disease) 11/04/2012  . Fibromyalgia 11/04/2012  . TMJ arthropathy 11/04/2012    Gabriela Eves, PT, DPT 09/12/2020, 5:40 PM  [facility] [address] [place], [place], [address] Phone: [phone]   Fax:  [phone]  Name: TRICHA RUGGIRELLO MRN: [id number] Date of Birth: August 07, 1960

## 2020-09-15 ENCOUNTER — Other Ambulatory Visit: Payer: Self-pay

## 2020-09-15 ENCOUNTER — Ambulatory Visit: Payer: 59 | Admitting: Physical Therapy

## 2020-09-15 DIAGNOSIS — M25561 Pain in right knee: Secondary | ICD-10-CM

## 2020-09-15 DIAGNOSIS — R600 Localized edema: Secondary | ICD-10-CM

## 2020-09-15 DIAGNOSIS — R262 Difficulty in walking, not elsewhere classified: Secondary | ICD-10-CM

## 2020-09-15 DIAGNOSIS — M6281 Muscle weakness (generalized): Secondary | ICD-10-CM

## 2020-09-15 NOTE — Therapy (Signed)
[facility] [place], [place], [address] Phone: [phone]   Fax:  [phone]  Physical Therapy Treatment PHYSICAL THERAPY DISCHARGE SUMMARY  Visits from Start of Care: 27  Current functional level related to goals / functional outcomes: See below   Remaining deficits: See goals   Education / Equipment: HEP Plan: Patient agrees to discharge.  Patient goals were met. Patient is being discharged due to meeting the stated rehab goals.  ?????     Patient Details  Name: [name] MRN: [id number] Date of Birth: 1960-10-15 Referring Provider (PT): Shelle Iron, [place]   Encounter Date: 09/15/2020   PT End of Session - 09/15/20 1119    Visit Number 27    Number of Visits 28    Date for PT Re-Evaluation 09/22/20    Authorization Type FOTO; Progress note every 10th visit    PT Start Time 1115    PT Stop Time 1206    PT Time Calculation (min) 51 min    Activity Tolerance Patient tolerated treatment well    Behavior During Therapy [facility] for tasks assessed/performed           Past Medical History:  Diagnosis Date  . Arthritis    right hand  . Dyslipidemia   . Family history of anesthesia complication    patients mother has severe nausea and vomiting after  . Fibromyalgia   . GERD (gastroesophageal reflux disease)   . Guillain-Barre syndrome ([place])   . Hiatal hernia   . History of kidney stones   . Kidney stones   . Migraines    one/month  . PONV (postoperative nausea and vomiting)   . TMJ (dislocation of temporomandibular joint)   . UTI (urinary tract infection)    hx of  . Vitamin D deficiency     Past Surgical History:  Procedure Laterality Date  . ABDOMINAL HYSTERECTOMY    . ADNOIDS    . ANTERIOR CERVICAL DECOMP/DISCECTOMY FUSION N/A 10/27/2013   Procedure: ACDF/ANTERIOR CERVICAL DECOMPRESSION/DISCECTOMY FUSION  C5-C7  (2 LEVELS);  Surgeon: Melina Schools, MD;  Location: [facility];  Service: Orthopedics;   Laterality: N/A;  . COLONOSCOPY  05/26/2020  . EYE SURGERY Bilateral    cataract removal  . KNEE ARTHROSCOPY Right   . TOTAL KNEE ARTHROPLASTY Right 06/30/2020   Procedure: TOTAL KNEE ARTHROPLASTY;  Surgeon: Netta Cedars, MD;  Location: [facility];  Service: Orthopedics;  Laterality: Right;  . UPPER GASTROINTESTINAL ENDOSCOPY  05/26/2020    There were no vitals filed for this visit.   Subjective Assessment - 09/15/20 1217    Subjective COVID-19 screening performed upon arrival. Patient has less adductor/groin pain and has been massaging distal ITB and lateral quad with less discomfort. Patient is ready for DC.    Pertinent History right TKA 06/30/2020; Fibromyalgia, migranes, anterior cervical decompression fusion 10/2013    Limitations Sitting;Standing;Walking;House hold activities    Diagnostic tests x-ray: inflammation    Patient Stated Goals get back to normal    Currently in Pain? No/denies              [facility] PT Assessment - 09/15/20 0001      Assessment   Medical Diagnosis Pain in right knee    Referring Provider (PT) Shelle Iron, PA-C    Onset Date/Surgical Date 06/30/20    Next MD Visit 09/14/2020    Prior Therapy yes  [facility] Adult PT Treatment/Exercise - 09/15/20 0001      Exercises   Exercises Knee/Hip      Knee/Hip Exercises: Aerobic   Recumbent Bike Level 3 Seat 4 x10 mins      Knee/Hip Exercises: Standing   Heel Raises Both;2 sets;10 reps      Modalities   Modalities Vasopneumatic;Electrical Stimulation      Vasopneumatic   Number Minutes Vasopneumatic  10 minutes    Vasopnuematic Location  Knee    Vasopneumatic Pressure Medium    Vasopneumatic Temperature  34      Manual Therapy   Manual Therapy Soft tissue mobilization;Edema management    Edema Management edema massage distal to proximal for right knee to decrease swelling    Soft tissue mobilization STW/M to distal ITB and adductors to decrease tone and pain                        PT Long Term Goals - 09/15/20 1155      PT LONG TERM GOAL #1   Title Patient will be independent with HEP    Time 4    Period Weeks    Status Achieved      PT LONG TERM GOAL #2   Title Patient will demonstrate 5 degrees or less of right knee extension AROM to improve gait mechanics.    Time 4    Period Weeks    Status Achieved      PT LONG TERM GOAL #3   Title Patient will demonstrate 115+ degrees of right knee flexion AROM to improve functional tasks.    Time 4    Status Achieved      PT LONG TERM GOAL #4   Title Patient will demonstrate 4+/5 or greater right knee MMT in all planes to improve stability during functional tasks.    Time 4    Period Weeks    Status Achieved      PT LONG TERM GOAL #5   Title Patient will report ability to perform ADLs, home activities, and work activites with right knee pain less than or equal to 3/10.    Time 4    Period Weeks    Status Achieved                 Plan - 09/15/20 1213    Clinical Impression Statement Patient arrived with less groin pain with minimal R knee pain. Patient was able to complete all TEs with fairly well despite pain. IASTM provided to lateral thigh and distal ITB with good response and moderate eryethema. All goals have been met. No adverse affects upon removal of modalities.    Personal Factors and Comorbidities Age;Time since onset of injury/illness/exacerbation    Examination-Activity Limitations Locomotion Level;Transfers;Stairs;Stand    Examination-Participation Restrictions Cleaning;Occupation    Stability/Clinical Decision Making Stable/Uncomplicated    Clinical Decision Making Low    Rehab Potential Good    PT Frequency 3x / week    PT Duration 4 weeks    PT Treatment/Interventions ADLs/Self Care Home Management;Iontophoresis 55m/ml Dexamethasone;Gait training;Stair training;Functional mobility training;Therapeutic activities;Electrical Stimulation;Therapeutic  exercise;Balance training;Neuromuscular re-education;Manual techniques;Passive range of motion;Patient/family education;Vasopneumatic Device;Moist Heat    PT Next Visit Plan DC    PT Home Exercise Plan ITB STW/M    Consulted and Agree with Plan of Care Patient           Patient will benefit from skilled therapeutic intervention in order to improve the following deficits and  impairments:  Difficulty walking,Decreased balance,Decreased activity tolerance,Decreased strength,Increased edema,Pain,Decreased range of motion,Abnormal gait  Visit Diagnosis: Acute pain of right knee  Muscle weakness (generalized)  Difficulty in walking, not elsewhere classified  Localized edema     Problem List Patient Active Problem List   Diagnosis Date Noted  . Rectal bleeding 05/10/2020  . Pain management contract agreement 05/16/2016  . Insomnia 04/13/2015  . BMI 26.0-26.9,adult 04/13/2015  . Chronic nausea 10/12/2014  . GAD (generalized anxiety disorder) 05/25/2014  . Hyperlipidemia 11/04/2012  . Migraines 11/04/2012  . GERD (gastroesophageal reflux disease) 11/04/2012  . Fibromyalgia 11/04/2012  . TMJ arthropathy 11/04/2012    Gabriela Eves, PT, DPT 09/15/2020, 12:22 PM  [facility] [address] [place], [place], [address] Phone: [phone]   Fax:  [phone]  Name: [name] MRN: [id number] Date of Birth: January 19, 1961

## 2020-10-13 ENCOUNTER — Other Ambulatory Visit: Payer: Self-pay

## 2020-10-13 ENCOUNTER — Encounter: Payer: Self-pay | Admitting: Nurse Practitioner

## 2020-10-13 ENCOUNTER — Ambulatory Visit: Payer: 59 | Admitting: Nurse Practitioner

## 2020-10-13 VITALS — BP 151/95 | HR 104 | Temp 97.3°F | Resp 20 | Ht 68.0 in | Wt 182.0 lb

## 2020-10-13 DIAGNOSIS — I10 Essential (primary) hypertension: Secondary | ICD-10-CM

## 2020-10-13 DIAGNOSIS — R5382 Chronic fatigue, unspecified: Secondary | ICD-10-CM | POA: Diagnosis not present

## 2020-10-13 MED ORDER — LISINOPRIL 10 MG PO TABS: 10.0000 mg | ORAL_TABLET | Freq: Every day | ORAL | 3 refills | Status: DC

## 2020-10-13 NOTE — Progress Notes (Signed)
   Subjective:    Patient ID: [name], female    DOB: March 31, 1961, 60 y.o.   MRN: [id number]   Chief Complaint: weak   HPI Patient comes in c/o weakness and fatigue. She says she feels need to take nap daily. She has been going to bed as early as 5pm and sleeping all night. She says her right leg feels weak, like it did when she had Guillian Barre syndrome years ago. She denise depression. She says this has been going on for about 3 weeks. BP Readings from Last 3 Encounters:  10/13/20 (!) 151/95  06/30/20 (!) 142/87  06/23/20 115/72     Review of Systems  Constitutional: Negative for diaphoresis.  Eyes: Negative for pain.  Respiratory: Negative for shortness of breath.   Cardiovascular: Negative for chest pain, palpitations and leg swelling.  Gastrointestinal: Negative for abdominal pain.  Endocrine: Negative for polydipsia.  Musculoskeletal: Negative for gait problem.  Skin: Negative for rash.  Neurological: Negative for dizziness, weakness and headaches.  Hematological: Does not bruise/bleed easily.  All other systems reviewed and are negative.      Objective:   Physical Exam Vitals and nursing note reviewed.  Constitutional:      Appearance: Normal appearance.  Cardiovascular:     Rate and Rhythm: Regular rhythm. Tachycardia present.     Heart sounds: Normal heart sounds.  Pulmonary:     Effort: Pulmonary effort is normal.     Breath sounds: Normal breath sounds.  Skin:    General: Skin is warm.  Neurological:     General: No focal deficit present.     Mental Status: She is alert and oriented to person, place, and time.  Psychiatric:        Mood and Affect: Mood normal.        Behavior: Behavior normal.     BP (!) 151/95   Pulse (!) 104   Temp (!) 97.3 F (36.3 C) (Temporal)   Resp 20   Ht $R'5\' 8"'uz$  (1.727 m)   Wt 182 lb (82.6 kg)   SpO2 99%   BMI 27.67 kg/m   EKG- sinus Merideth Abbey, FNP        Assessment & Plan:  [name] in today with chief complaint of Leg Pain   1. Chronic fatigue Labs pending- will decide course of action once  Labs are back - Anemia Profile B - Thyroid Panel With TSH - Vitamin B12 - EKG 12-Lead - CMP14+EGFR  2. Primary hypertension Low sodium diet Keep diary of blood pressure at home. - lisinopril (ZESTRIL) 10 MG tablet; Take 1 tablet (10 mg total) by mouth daily.  Dispense: 90 tablet; Refill: 3  3 tachycardia Keep check of heart rate the next few days Avoid caffeine   The above assessment and management plan was discussed with the patient. The patient verbalized understanding of and has agreed to the management plan. Patient is aware to call the clinic if symptoms persist or worsen. Patient is aware when to return to the clinic for a follow-up visit. Patient educated on when it is appropriate to go to the emergency department.   Mary-Margaret Hassell Done, FNP

## 2020-10-13 NOTE — Patient Instructions (Signed)
Sinus Tachycardia  Sinus tachycardia is a kind of fast heartbeat. In sinus tachycardia, the heart beats more than 100 times a minute. Sinus tachycardia starts in a part of the heart called the sinus node. Sinus tachycardia may be harmless, or it may be a sign of a serious condition. What are the causes? This condition may be caused by:  Exercise or exertion.  A fever.  Pain.  Loss of body fluids (dehydration).  Severe bleeding (hemorrhage).  Anxiety and stress.  Certain substances, including: ? Alcohol. ? Caffeine. ? Tobacco and nicotine products. ? Cold medicines. ? Illegal drugs.  Medical conditions including: ? Heart disease. ? An infection. ? An overactive thyroid (hyperthyroidism). ? A lack of red blood cells (anemia). What are the signs or symptoms? Symptoms of this condition include:  A feeling that the heart is beating quickly (palpitations).  Suddenly noticing your heartbeat (cardiac awareness).  Dizziness.  Tiredness (fatigue).  Shortness of breath.  Chest pain.  Nausea.  Fainting. How is this diagnosed? This condition is diagnosed with:  A physical exam.  Other tests, such as: ? Blood tests. ? An electrocardiogram (ECG). This test measures the electrical activity of the heart. ? Ambulatory cardiac monitor. This records your heartbeats for 24 hours or more. You may be referred to a heart specialist (cardiologist). How is this treated? Treatment for this condition depends on the cause or the underlying condition. Treatment may involve:  Treating the underlying condition.  Taking new medicines or changing your current medicines as told by your health care provider.  Making changes to your diet or lifestyle. Follow these instructions at home: Lifestyle  Do not use any products that contain nicotine or tobacco, such as cigarettes and e-cigarettes. If you need help quitting, ask your health care provider.  Do not use illegal drugs, such as  cocaine.  Learn relaxation methods to help you when you get stressed or anxious. These include deep breathing.  Avoid caffeine or other stimulants.   Alcohol use  Do not drink alcohol if: ? Your health care provider tells you not to drink. ? You are pregnant, may be pregnant, or are planning to become pregnant.  If you drink alcohol, limit how much you have: ? 0-1 drink a day for women. ? 0-2 drinks a day for men.  Be aware of how much alcohol is in your drink. In the [place] one drink equals one typical bottle of beer (12 oz), one-half glass of wine (5 oz), or one shot of hard liquor (1 oz).   General instructions  Drink enough fluids to keep your urine pale yellow.  Take over-the-counter and prescription medicines only as told by your health care provider.  Keep all follow-up visits as told by your health care provider. This is important. Contact a health care provider if you have:  A fever.  Vomiting or diarrhea that does not go away. Get help right away if you:  Have pain in your chest, upper arms, jaw, or neck.  Become weak or dizzy.  Feel faint.  Have palpitations that do not go away. Summary  In sinus tachycardia, the heart beats more than 100 times a minute.  Sinus tachycardia may be harmless, or it may be a sign of a serious condition.  Treatment for this condition depends on the cause or the underlying condition.  Get help right away if you have pain in your chest, upper arms, jaw, or neck. This information is not intended to replace advice given to   you by your health care provider. Make sure you discuss any questions you have with your health care provider. Document Revised: 07/16/2017 Document Reviewed: 07/16/2017 Elsevier Patient Education  2021 Elsevier Inc.  

## 2020-10-14 LAB — CMP14+EGFR
ALT: 35 IU/L — ABNORMAL HIGH (ref 0–32)
AST: 32 IU/L (ref 0–40)
Albumin/Globulin Ratio: 1.7 (ref 1.2–2.2)
Albumin: 4.2 g/dL (ref 3.8–4.8)
Alkaline Phosphatase: 84 IU/L (ref 44–121)
BUN/Creatinine Ratio: 18 (ref 12–28)
BUN: 11 mg/dL (ref 8–27)
Bilirubin Total: 0.2 mg/dL (ref 0.0–1.2)
CO2: 24 mmol/L (ref 20–29)
Calcium: 9.4 mg/dL (ref 8.7–10.3)
Chloride: 97 mmol/L (ref 96–106)
Creatinine, Ser: 0.61 mg/dL (ref 0.57–1.00)
Globulin, Total: 2.5 g/dL (ref 1.5–4.5)
Glucose: 81 mg/dL (ref 65–99)
Potassium: 4 mmol/L (ref 3.5–5.2)
Sodium: 139 mmol/L (ref 134–144)
Total Protein: 6.7 g/dL (ref 6.0–8.5)
eGFR: 101 mL/min/{1.73_m2} (ref >59)

## 2020-10-14 LAB — ANEMIA PROFILE B
Basophils Absolute: 0 10*3/uL (ref 0.0–0.2)
Basos: 0 %
EOS (ABSOLUTE): 0.2 10*3/uL (ref 0.0–0.4)
Eos: 2 %
Ferritin: 208 ng/mL — ABNORMAL HIGH (ref 15–150)
Folate: 12.3 ng/mL (ref >3.0)
Hematocrit: 43.7 % (ref 34.0–46.6)
Hemoglobin: 14.3 g/dL (ref 11.1–15.9)
Immature Grans (Abs): 0 10*3/uL (ref 0.0–0.1)
Immature Granulocytes: 0 %
Iron Saturation: 32 % (ref 15–55)
Iron: 97 ug/dL (ref 27–139)
Lymphocytes Absolute: 2.4 10*3/uL (ref 0.7–3.1)
Lymphs: 29 %
MCH: 28.8 pg (ref 26.6–33.0)
MCHC: 32.7 g/dL (ref 31.5–35.7)
MCV: 88 fL (ref 79–97)
Monocytes Absolute: 0.7 10*3/uL (ref 0.1–0.9)
Monocytes: 8 %
Neutrophils Absolute: 5.2 10*3/uL (ref 1.4–7.0)
Neutrophils: 61 %
Platelets: 230 10*3/uL (ref 150–450)
RBC: 4.97 x10E6/uL (ref 3.77–5.28)
RDW: 12.5 % (ref 11.7–15.4)
Retic Ct Pct: 1.3 % (ref 0.6–2.6)
Total Iron Binding Capacity: 300 ug/dL (ref 250–450)
UIBC: 203 ug/dL (ref 118–369)
Vitamin B-12: 637 pg/mL (ref 232–1245)
WBC: 8.6 10*3/uL (ref 3.4–10.8)

## 2020-10-14 LAB — THYROID PANEL WITH TSH
Free Thyroxine Index: 1.5 (ref 1.2–4.9)
T3 Uptake Ratio: 20 % — ABNORMAL LOW (ref 24–39)
T4, Total: 7.5 ug/dL (ref 4.5–12.0)
TSH: 2.17 u[IU]/mL (ref 0.450–4.500)

## 2020-11-07 ENCOUNTER — Other Ambulatory Visit: Payer: Self-pay | Admitting: Nurse Practitioner

## 2020-11-08 ENCOUNTER — Telehealth: Payer: Self-pay | Admitting: Nurse Practitioner

## 2020-11-08 NOTE — Telephone Encounter (Signed)
  Prescription Request  11/08/2020  What is the name of the medication or equipment? Hydrocodone  Have you contacted your pharmacy to request a refill? (if applicable) Yes  Which pharmacy would you like this sent to? [facility]  Pt called stating that she spoke with pharmacy and was told that we denied refill request because pt needed to make an appt. Pt said she just saw [facility] recently and was told that she didn't need to come back until July. Pt is completely out of her medicine and needs refills called in ASAP.  Please advise and call patient.

## 2020-11-09 NOTE — Telephone Encounter (Signed)
Patient needs to be seen.

## 2020-11-09 NOTE — Telephone Encounter (Signed)
Left detailed message on patients phone to contact office about an appointment. Advised that she was recently seen but it was for an acute visit and needed follow up for pain meds. Also told patient we can work her in [place] schedule if that works for her

## 2020-11-10 ENCOUNTER — Encounter: Payer: Self-pay | Admitting: Nurse Practitioner

## 2020-11-10 ENCOUNTER — Ambulatory Visit: Payer: 59 | Admitting: Nurse Practitioner

## 2020-11-10 ENCOUNTER — Other Ambulatory Visit: Payer: Self-pay

## 2020-11-10 ENCOUNTER — Other Ambulatory Visit: Payer: Self-pay | Admitting: Nurse Practitioner

## 2020-11-10 VITALS — BP 141/86 | HR 109 | Temp 97.5°F | Resp 20 | Ht 68.0 in | Wt 176.0 lb

## 2020-11-10 DIAGNOSIS — M797 Fibromyalgia: Secondary | ICD-10-CM

## 2020-11-10 DIAGNOSIS — E7800 Pure hypercholesterolemia, unspecified: Secondary | ICD-10-CM | POA: Diagnosis not present

## 2020-11-10 DIAGNOSIS — N951 Menopausal and female climacteric states: Secondary | ICD-10-CM

## 2020-11-10 MED ORDER — KETOROLAC TROMETHAMINE 30 MG/ML IJ SOLN
60.0000 mg | Freq: Once | INTRAMUSCULAR | Status: AC
  Administered 2020-11-10: 60 mg via INTRAMUSCULAR

## 2020-11-10 MED ORDER — PRAVASTATIN SODIUM 40 MG PO TABS
40.0000 mg | ORAL_TABLET | Freq: Every day | ORAL | 1 refills | Status: DC
Start: 2020-11-10 — End: 2021-02-19

## 2020-11-10 MED ORDER — HYDROCODONE-ACETAMINOPHEN 10-325 MG PO TABS
1.0000 | ORAL_TABLET | Freq: Three times a day (TID) | ORAL | 0 refills | Status: DC
Start: 2020-12-10 — End: 2021-02-19

## 2020-11-10 MED ORDER — KETOROLAC TROMETHAMINE 60 MG/2ML IM SOLN: 60.0000 mg | Freq: Once | INTRAMUSCULAR | Status: DC

## 2020-11-10 MED ORDER — HYDROCODONE-ACETAMINOPHEN 10-325 MG PO TABS: 1.0000 | ORAL_TABLET | Freq: Three times a day (TID) | ORAL | 0 refills | Status: DC

## 2020-11-10 MED ORDER — FUROSEMIDE 20 MG PO TABS: 20.0000 mg | ORAL_TABLET | Freq: Every day | ORAL | 1 refills | Status: DC

## 2020-11-10 MED ORDER — ESTRADIOL 1 MG PO TABS: 1.0000 mg | ORAL_TABLET | Freq: Every day | ORAL | 1 refills | Status: DC

## 2020-11-10 MED ORDER — HYDROCODONE-ACETAMINOPHEN 10-325 MG PO TABS
1.0000 | ORAL_TABLET | Freq: Three times a day (TID) | ORAL | 0 refills | Status: DC
Start: 2020-11-10 — End: 2021-02-19

## 2020-11-10 NOTE — Patient Instructions (Signed)
Myofascial Pain Syndrome and Fibromyalgia Myofascial pain syndrome and fibromyalgia are both pain disorders. This pain may be felt mainly in your muscles.  Myofascial pain syndrome: ? Always has tender points in the muscle that will cause pain when pressed (trigger points). The pain may come and go. ? Usually affects your neck, upper back, and shoulder areas. The pain often radiates into your arms and hands.  Fibromyalgia: ? Has muscle pains and tenderness that come and go. ? Is often associated with fatigue and sleep problems. ? Has trigger points. ? Tends to be long-lasting (chronic), but is not life-threatening. Fibromyalgia and myofascial pain syndrome are not the same. However, they often occur together. If you have both conditions, each can make the other worse. Both are common and can cause enough pain and fatigue to make day-to-day activities difficult. Both can be hard to diagnose because their symptoms are common in many other conditions. What are the causes? The exact causes of these conditions are not known. What increases the risk? You are more likely to develop this condition if:  You have a family history of the condition.  You have certain triggers, such as: ? Spine disorders. ? An injury (trauma) or other physical stressors. ? Being under a lot of stress. ? Medical conditions such as osteoarthritis, rheumatoid arthritis, or lupus. What are the signs or symptoms? Fibromyalgia The main symptom of fibromyalgia is widespread pain and tenderness in your muscles. Pain is sometimes described as stabbing, shooting, or burning. You may also have:  Tingling or numbness.  Sleep problems and fatigue.  Problems with attention and concentration (fibro fog). Other symptoms may include:  Bowel and bladder problems.  Headaches.  Visual problems.  Problems with odors and noises.  Depression or mood changes.  Painful menstrual periods (dysmenorrhea).  Dry skin or  eyes. These symptoms can vary over time. Myofascial pain syndrome Symptoms of myofascial pain syndrome include:  Tight, ropy bands of muscle.  Uncomfortable sensations in muscle areas. These may include aching, cramping, burning, numbness, tingling, and weakness.  Difficulty moving certain parts of the body freely (poor range of motion). How is this diagnosed? This condition may be diagnosed by your symptoms and medical history. You will also have a physical exam. In general:  Fibromyalgia is diagnosed if you have pain, fatigue, and other symptoms for more than 3 months, and symptoms cannot be explained by another condition.  Myofascial pain syndrome is diagnosed if you have trigger points in your muscles, and those trigger points are tender and cause pain elsewhere in your body (referred pain). How is this treated? Treatment for these conditions depends on the type that you have.  For fibromyalgia: ? Pain medicines, such as NSAIDs. ? Medicines for treating depression. ? Medicines for treating seizures. ? Medicines that relax the muscles.  For myofascial pain: ? Pain medicines, such as NSAIDs. ? Cooling and stretching of muscles. ? Trigger point injections. ? Sound wave (ultrasound) treatments to stimulate muscles. Treating these conditions often requires a team of health care providers. These may include:  Your primary care provider.  Physical therapist.  Complementary health care providers, such as massage therapists or acupuncturists.  Psychiatrist for cognitive behavioral therapy.   Follow these instructions at home: Medicines  Take over-the-counter and prescription medicines only as told by your health care provider.  Do not drive or use heavy machinery while taking prescription pain medicine.  If you are taking prescription pain medicine, take actions to prevent or treat constipation. Your  health care provider may recommend that you: ? Drink enough fluid to keep  your urine pale yellow. ? Eat foods that are high in fiber, such as fresh fruits and vegetables, whole grains, and beans. ? Limit foods that are high in fat and processed sugars, such as fried or sweet foods. ? Take an over-the-counter or prescription medicine for constipation. Lifestyle  Exercise as directed by your health care provider or physical therapist.  Practice relaxation techniques to control your stress. You may want to try: ? Biofeedback. ? Visual imagery. ? Hypnosis. ? Muscle relaxation. ? Yoga. ? Meditation.  Maintain a healthy lifestyle. This includes eating a healthy diet and getting enough sleep.  Do not use any products that contain nicotine or tobacco, such as cigarettes and e-cigarettes. If you need help quitting, ask your health care provider.   General instructions  Talk to your health care provider about complementary treatments, such as acupuncture or massage.  Consider joining a support group with others who are diagnosed with this condition.  Do not do activities that stress or strain your muscles. This includes repetitive motions and heavy lifting.  Keep all follow-up visits as told by your health care provider. This is important. Where to find more information  National Fibromyalgia Association: www.fmaware.[place]: www.arthritis.org  American Chronic Pain Association: www.theacpa.org Contact a health care provider if:  You have new symptoms.  Your symptoms get worse or your pain is severe.  You have side effects from your medicines.  You have trouble sleeping.  Your condition is causing depression or anxiety. Summary  Myofascial pain syndrome and fibromyalgia are pain disorders.  Myofascial pain syndrome has tender points in the muscle that will cause pain when pressed (trigger points). Fibromyalgia also has muscle pains and tenderness that come and go, but this condition is often associated with fatigue and sleep  disturbances.  Fibromyalgia and myofascial pain syndrome are not the same but often occur together, causing pain and fatigue that make day-to-day activities difficult.  Treatment for fibromyalgia includes taking medicines to relax the muscles and medicines for pain, depression, or seizures. Treatment for myofascial pain syndrome includes taking medicines for pain, cooling and stretching of muscles, and injecting medicines into trigger points.  Follow your health care provider's instructions for taking medicines and maintaining a healthy lifestyle. This information is not intended to replace advice given to you by your health care provider. Make sure you discuss any questions you have with your health care provider. Document Revised: 09/18/2018 Document Reviewed: 06/11/2017 Elsevier Patient Education  2021 [employer].

## 2020-11-10 NOTE — Progress Notes (Signed)
Subjective:    Patient ID: [name], female    DOB: January 09, 1961, 60 y.o.   MRN: [id number]   Chief Complaint: pain management   HPI Patient has fibromyalgia and is on norco 10/325 which she takes 2-3x a day. Pain assessment: Cause of pain- fibromyalgia Pain location- varies from day to day Pain on scale of 1-10- 5/10 Frequency- daily What increases pain-nothing really What makes pain Better-rest helps Effects on ADL - none Any change in general medical condition-none  Current opioids rx- norco 10/325 2-3 x a day # meds rx- 3 Effectiveness of current meds-helps make pain tolerable Adverse reactions from pain meds-none Morphine equivalent- 30MME  Pill count performed-No Last drug screen -3/ 2021 ( high risk q24m, moderate risk q65m, low risk yearly ) Urine drug screen today- Yes Was the [place] reviewed- yes  If yes were their any concerning findings? - no   Overdose risk: 1 Opioid Risk  08/12/2019  Alcohol 0  Illegal Drugs 0  Rx Drugs 0  Alcohol 0  Illegal Drugs 0  Rx Drugs 0  Age between 16-45 years  0  History of Preadolescent Sexual Abuse 0  Psychological Disease 0  Depression 0  Opioid Risk Tool Scoring 0  Opioid Risk Interpretation Low Risk     Pain contract signed on:11/10/20  * [place] [facility] [place] [facility] to get flexibilty and strength back. Her weight is down 6 lbs since last visit. Wt Readings from Last 3 Encounters:  11/10/20 176 lb (79.8 kg)  10/13/20 182 lb (82.6 kg)  06/30/20 182 lb (82.6 kg)     Review of Systems  Constitutional: Negative for diaphoresis.  Eyes: Negative for pain.  Respiratory: Negative for shortness of breath.   Cardiovascular: Negative for chest pain, palpitations and leg swelling.  Gastrointestinal: Negative for abdominal pain.  Endocrine: Negative for polydipsia.  Skin: Negative for rash.  Neurological: Negative for dizziness, weakness and headaches.  Hematological: Does not bruise/bleed  easily.  All other systems reviewed and are negative.      Objective:   Physical Exam Vitals and nursing note reviewed.  Constitutional:      Appearance: Normal appearance.  Cardiovascular:     Rate and Rhythm: Normal rate and regular rhythm.     Heart sounds: Normal heart sounds.  Pulmonary:     Effort: Pulmonary effort is normal.     Breath sounds: Normal breath sounds.  Skin:    General: Skin is warm.  Neurological:     General: No focal deficit present.     Mental Status: She is alert and oriented to person, place, and time.  Psychiatric:        Mood and Affect: Mood normal.        Behavior: Behavior normal.     BP (!) 141/86   Pulse (!) 109   Temp (!) 97.5 F (36.4 C) (Temporal)   Resp 20   Ht 5\' 8"  (1.727 m)   Wt 176 lb (79.8 kg)   SpO2 94%   BMI 26.76 kg/m        Assessment & Plan:  [name] in today with chief complaint of No chief complaint on file.   1. Fibromyalgia Continue to exercise - ToxASSURE Select 13 (MW), Urine - HYDROcodone-acetaminophen (NORCO) 10-325 MG tablet; Take 1 tablet by mouth 3 (three) times daily.  Dispense: 90 tablet; Refill: 0 - HYDROcodone-acetaminophen (NORCO) 10-325 MG tablet; Take 1 tablet by mouth 3 (three)  times daily.  Dispense: 90 tablet; Refill: 0 - HYDROcodone-acetaminophen (NORCO) 10-325 MG tablet; Take 1 tablet by mouth 3 (three) times daily.  Dispense: 90 tablet; Refill: 0 - ketorolac (TORADOL) injection 60 mg    The above assessment and management plan was discussed with the patient. The patient verbalized understanding of and has agreed to the management plan. Patient is aware to call the clinic if symptoms persist or worsen. Patient is aware when to return to the clinic for a follow-up visit. Patient educated on when it is appropriate to go to the emergency department.   Mary-Margaret Hassell Done, FNP

## 2020-11-10 NOTE — Addendum Note (Signed)
Addended by: Chevis Pretty on: 11/10/2020 10:12 AM   Modules accepted: Orders

## 2020-11-10 NOTE — Addendum Note (Signed)
Addended by: Chevis Pretty on: 11/10/2020 10:08 AM   Modules accepted: Orders

## 2020-11-17 LAB — TOXASSURE SELECT 13 (MW), URINE

## 2020-12-01 ENCOUNTER — Telehealth: Payer: Self-pay | Admitting: Nurse Practitioner

## 2020-12-01 DIAGNOSIS — M26659 Arthropathy of unspecified temporomandibular joint: Secondary | ICD-10-CM

## 2020-12-01 MED ORDER — CYCLOBENZAPRINE HCL 10 MG PO TABS: ORAL_TABLET | ORAL | 1 refills | Status: DC

## 2020-12-01 MED ORDER — SAVELLA 50 MG PO TABS: 50.0000 mg | ORAL_TABLET | Freq: Two times a day (BID) | ORAL | 0 refills | Status: DC

## 2020-12-01 NOTE — Telephone Encounter (Signed)
Patient aware.

## 2020-12-01 NOTE — Telephone Encounter (Signed)
Medications refilled

## 2020-12-01 NOTE — Telephone Encounter (Signed)
Last office visit 11/10/20 [name] is on med history list but not on current list, Flexeril is on list

## 2020-12-16 ENCOUNTER — Other Ambulatory Visit: Payer: Self-pay | Admitting: Family Medicine

## 2020-12-16 DIAGNOSIS — M26659 Arthropathy of unspecified temporomandibular joint: Secondary | ICD-10-CM

## 2021-01-04 ENCOUNTER — Telehealth: Payer: Self-pay | Admitting: Nurse Practitioner

## 2021-01-04 MED ORDER — MOLNUPIRAVIR EUA 200MG CAPSULE: 4.0000 | ORAL_CAPSULE | Freq: Two times a day (BID) | ORAL | 0 refills | Status: AC

## 2021-01-04 NOTE — Telephone Encounter (Signed)
Patient tested positive for covid again this morning Meds ordered this encounter  Medications   molnupiravir EUA 200 mg CAPS    Sig: Take 4 capsules (800 mg total) by mouth 2 (two) times daily for 5 days.    Dispense:  40 capsule    Refill:  0    Order Specific Question:   Supervising Provider    Answer:   Caryl Pina A [id number]   Medication discussed.  Mary-Margaret Hassell Done, FNP

## 2021-01-17 ENCOUNTER — Other Ambulatory Visit: Payer: Self-pay | Admitting: Nurse Practitioner

## 2021-01-17 MED ORDER — TRAZODONE HCL 50 MG PO TABS: 25.0000 mg | ORAL_TABLET | Freq: Every evening | ORAL | 3 refills | Status: DC | PRN

## 2021-01-17 NOTE — Progress Notes (Unsigned)
Prescription for trazadone sent to pharmacy

## 2021-02-01 ENCOUNTER — Other Ambulatory Visit: Payer: Self-pay | Admitting: Family Medicine

## 2021-02-19 ENCOUNTER — Ambulatory Visit: Payer: 59 | Admitting: Nurse Practitioner

## 2021-02-19 ENCOUNTER — Other Ambulatory Visit: Payer: Self-pay

## 2021-02-19 ENCOUNTER — Encounter: Payer: Self-pay | Admitting: Nurse Practitioner

## 2021-02-19 VITALS — BP 141/91 | HR 106 | Temp 97.8°F | Resp 20 | Ht 68.0 in | Wt 184.0 lb

## 2021-02-19 DIAGNOSIS — G43109 Migraine with aura, not intractable, without status migrainosus: Secondary | ICD-10-CM | POA: Diagnosis not present

## 2021-02-19 DIAGNOSIS — E7800 Pure hypercholesterolemia, unspecified: Secondary | ICD-10-CM | POA: Diagnosis not present

## 2021-02-19 DIAGNOSIS — F411 Generalized anxiety disorder: Secondary | ICD-10-CM

## 2021-02-19 DIAGNOSIS — K219 Gastro-esophageal reflux disease without esophagitis: Secondary | ICD-10-CM | POA: Diagnosis not present

## 2021-02-19 DIAGNOSIS — M797 Fibromyalgia: Secondary | ICD-10-CM

## 2021-02-19 DIAGNOSIS — I10 Essential (primary) hypertension: Secondary | ICD-10-CM | POA: Diagnosis not present

## 2021-02-19 DIAGNOSIS — F5101 Primary insomnia: Secondary | ICD-10-CM

## 2021-02-19 DIAGNOSIS — R110 Nausea: Secondary | ICD-10-CM

## 2021-02-19 DIAGNOSIS — Z6826 Body mass index (BMI) 26.0-26.9, adult: Secondary | ICD-10-CM

## 2021-02-19 MED ORDER — TRAZODONE HCL 50 MG PO TABS: 25.0000 mg | ORAL_TABLET | Freq: Every evening | ORAL | 3 refills | Status: DC | PRN

## 2021-02-19 MED ORDER — SUMATRIPTAN SUCCINATE 100 MG PO TABS: ORAL_TABLET | ORAL | 2 refills | Status: DC

## 2021-02-19 MED ORDER — HYDROCODONE-ACETAMINOPHEN 10-325 MG PO TABS
1.0000 | ORAL_TABLET | Freq: Three times a day (TID) | ORAL | 0 refills | Status: DC
Start: 2021-03-21 — End: 2021-05-21

## 2021-02-19 MED ORDER — KETOROLAC TROMETHAMINE 60 MG/2ML IM SOLN
60.0000 mg | Freq: Once | INTRAMUSCULAR | Status: AC
  Administered 2021-02-19: 60 mg via INTRAMUSCULAR

## 2021-02-19 MED ORDER — FUROSEMIDE 20 MG PO TABS: 20.0000 mg | ORAL_TABLET | Freq: Every day | ORAL | 1 refills | Status: DC

## 2021-02-19 MED ORDER — HYDROCODONE-ACETAMINOPHEN 10-325 MG PO TABS: 1.0000 | ORAL_TABLET | Freq: Three times a day (TID) | ORAL | 0 refills | Status: DC

## 2021-02-19 MED ORDER — PRAVASTATIN SODIUM 40 MG PO TABS
40.0000 mg | ORAL_TABLET | Freq: Every day | ORAL | 1 refills | Status: DC
Start: 2021-02-19 — End: 2021-05-21

## 2021-02-19 MED ORDER — SAVELLA 50 MG PO TABS
50.0000 mg | ORAL_TABLET | Freq: Two times a day (BID) | ORAL | 1 refills | Status: DC
Start: 2021-02-19 — End: 2021-05-21

## 2021-02-19 MED ORDER — LISINOPRIL 10 MG PO TABS
10.0000 mg | ORAL_TABLET | Freq: Every day | ORAL | 3 refills | Status: DC
Start: 2021-02-19 — End: 2021-04-12

## 2021-02-19 MED ORDER — HYDROCODONE-ACETAMINOPHEN 10-325 MG PO TABS
1.0000 | ORAL_TABLET | Freq: Three times a day (TID) | ORAL | 0 refills | Status: DC
Start: 2021-02-19 — End: 2021-05-21

## 2021-02-19 NOTE — Patient Instructions (Signed)

## 2021-02-19 NOTE — Progress Notes (Signed)
Subjective:    Patient ID: [name], female    DOB: 1960-12-19, 60 y.o.   MRN: [id number]    Chief Complaint: medical management of chronic issues      HPI:  1. Primary hypertension No c/o chest pain,sob or headache. Does not check blood pressure at home. BP Readings from Last 3 Encounters:  02/19/21 (!) 141/91  11/10/20 (!) 141/86  10/13/20 (!) 151/95      2. Pure hypercholesterolemia Does try to watch diet. Has done little to no exercise as of late. Lab Results  Component Value Date   CHOL 194 06/16/2020   HDL 41 06/16/2020   LDLCALC 115 (H) 06/16/2020   TRIG 217 (H) 06/16/2020   CHOLHDL 4.7 (H) 06/16/2020     3. Gastroesophageal reflux disease, unspecified whether esophagitis present Only takes oTC meds as needed.  4. Migraine with aura and without status migrainosus, not intractable Having 2-3 a month. Takes imitrex which usually works  5. Chronic nausea Uses zofran as needed  6. Fibromyalgia Pain assessment: Cause of pain- fibromyalgia Pain location- varies from day to day Pain on scale of 1-10- 5/10 curently Frequency- dialy What increases pain-nothing really What makes pain Better-rest and pain meds hlep Effects on ADL - none Any change in general medical condition-none  Current opioids rx- norco 10/325 tid # meds rx- 90 Effectiveness of current meds-helps Adverse reactions from pain meds-none Morphine equivalent- 20MME  Pill count performed-No Last drug screen - 11/10/20 ( high risk q30m moderate risk q658mlow risk yearly ) Urine drug screen today- No [place]  If yes were their any concerning findings? - none   Overdose risk: 1 Opioid Risk  08/12/2019  Alcohol 0  Illegal Drugs 0  Rx Drugs 0  Alcohol 0  Illegal Drugs 0  Rx Drugs 0  Age between 16-45 years  0  History of Preadolescent Sexual Abuse 0  Psychological Disease 0  Depression 0  Opioid Risk Tool Scoring 0  Opioid Risk Interpretation Low Risk      Pain contract signed on:11/13/20   7. GAD (generalized anxiety disorder) Is actually doing well. She is on savella which helps some with her anxiety GAD 7 : Generalized Anxiety Score 02/19/2021 11/10/2020 03/06/2020 11/18/2019  Nervous, Anxious, on Edge 1 1 0 3  Control/stop worrying 1 0 1 3  Worry too much - different things 1 0 1 3  Trouble relaxing 1 1 0 3  Restless 0 0 0 1  Easily annoyed or irritable 1 1 0 3  Afraid - awful might happen 0 0 0 1  Total GAD 7 Score '5 3 2 17  '$ Anxiety Difficulty Not difficult at all Somewhat difficult Somewhat difficult Not difficult at all      8. Primary insomnia Is on trazadone. Sleeps well some nights.  9. BMI 26.0-26.9,adult No recent weight changes Wt Readings from Last 3 Encounters:  11/10/20 176 lb (79.8 kg)  10/13/20 182 lb (82.6 kg)  06/30/20 182 lb (82.6 kg)   BMI Readings from Last 3 Encounters:  11/10/20 26.76 kg/m  10/13/20 27.67 kg/m  06/30/20 27.67 kg/m       Outpatient Encounter Medications as of 02/19/2021  Medication Sig   calcium carbonate (OSCAL) 1500 (600 Ca) MG TABS tablet Take 600 mg of elemental calcium by mouth daily with breakfast.   Cholecalciferol (VITAMIN D3) 250 MCG (10000 UT) capsule Take 10,000 Units by mouth daily.   cyclobenzaprine (FLEXERIL) 10 MG tablet  TAKE ONE TABLET THREE TIMES DAILY   EPINEPHrine 0.3 mg/0.3 mL IJ SOAJ injection Inject 0.3 mLs (0.3 mg total) into the muscle once.   estradiol (ESTRACE) 1 MG tablet Take 1 tablet (1 mg total) by mouth daily.   furosemide (LASIX) 20 MG tablet Take 1 tablet (20 mg total) by mouth daily.   HYDROcodone-acetaminophen (NORCO) 10-325 MG tablet Take 1 tablet by mouth 3 (three) times daily.   HYDROcodone-acetaminophen (NORCO) 10-325 MG tablet Take 1 tablet by mouth 3 (three) times daily.   HYDROcodone-acetaminophen (NORCO) 10-325 MG tablet Take 1 tablet by mouth 3 (three) times daily.   lisinopril (ZESTRIL) 10 MG tablet Take 1 tablet (10 mg total) by  mouth daily.   loratadine (CLARITIN) 10 MG tablet Take 10 mg by mouth daily.   Multiple Vitamin (MULTIVITAMIN) tablet Take 1 tablet by mouth daily.   ondansetron (ZOFRAN ODT) 4 MG disintegrating tablet Take 1 tablet (4 mg total) by mouth every 8 (eight) hours as needed for nausea or vomiting.   pravastatin (PRAVACHOL) 40 MG tablet Take 1 tablet (40 mg total) by mouth daily.   SAVELLA 50 MG TABS tablet TAKE ONE TABLET TWICE DAILY   SUMAtriptan (IMITREX) 100 MG tablet TAKE 1 TABLET AT ONSET OF HEADACHE, MAY REPEAT ONCE IN 2HOURS   traZODone (DESYREL) 50 MG tablet Take 0.5-1 tablets (25-50 mg total) by mouth at bedtime as needed for sleep.   No facility-administered encounter medications on file as of 02/19/2021.    Past Surgical History:  Procedure Laterality Date   ABDOMINAL HYSTERECTOMY     ADNOIDS     ANTERIOR CERVICAL DECOMP/DISCECTOMY FUSION N/A 10/27/2013   Procedure: ACDF/ANTERIOR CERVICAL DECOMPRESSION/DISCECTOMY FUSION  C5-C7  (2 LEVELS);  Surgeon: Melina Schools, MD;  Location: [facility];  Service: Orthopedics;  Laterality: N/A;   COLONOSCOPY  05/26/2020   EYE SURGERY Bilateral    cataract removal   KNEE ARTHROSCOPY Right    TOTAL KNEE ARTHROPLASTY Right 06/30/2020   Procedure: TOTAL KNEE ARTHROPLASTY;  Surgeon: Netta Cedars, MD;  Location: [facility];  Service: Orthopedics;  Laterality: Right;   UPPER GASTROINTESTINAL ENDOSCOPY  05/26/2020    Family History  Problem Relation Age of Onset   Heart disease Mother    Cancer Father        STOMACH CANCER   Stomach cancer Father    Cancer Maternal Grandfather    Diabetes Paternal Grandmother    Rectal cancer Neg Hx    Esophageal cancer Neg Hx    Colon cancer Neg Hx     New complaints: - is having total hip replacement on right in November.  Social history: Lives by herself. Her mom passed away several weeks ago.  Controlled substance contract: 11/13/20   Review of Systems  Constitutional:  Negative for diaphoresis.  Eyes:   Negative for pain.  Respiratory:  Negative for shortness of breath.   Cardiovascular:  Negative for chest pain, palpitations and leg swelling.  Gastrointestinal:  Negative for abdominal pain.  Endocrine: Negative for polydipsia.  Skin:  Negative for rash.  Neurological:  Negative for dizziness, weakness and headaches.  Hematological:  Does not bruise/bleed easily.  All other systems reviewed and are negative.     Objective:   Physical Exam Vitals and nursing note reviewed.  Constitutional:      General: She is not in acute distress.    Appearance: Normal appearance. She is well-developed.  HENT:     Head: Normocephalic.     Right Ear: Tympanic membrane  normal.     Left Ear: Tympanic membrane normal.     Nose: Nose normal.     Mouth/Throat:     Mouth: Mucous membranes are moist.  Eyes:     Pupils: Pupils are equal, round, and reactive to light.  Neck:     Vascular: No carotid bruit or JVD.  Cardiovascular:     Rate and Rhythm: Normal rate and regular rhythm.     Heart sounds: Normal heart sounds.  Pulmonary:     Effort: Pulmonary effort is normal. No respiratory distress.     Breath sounds: Normal breath sounds. No wheezing or rales.  Chest:     Chest wall: No tenderness.  Abdominal:     General: Bowel sounds are normal. There is no distension or abdominal bruit.     Palpations: Abdomen is soft. There is no hepatomegaly, splenomegaly, mass or pulsatile mass.     Tenderness: There is no abdominal tenderness.  Musculoskeletal:        General: Normal range of motion.     Cervical back: Normal range of motion and neck supple.  Lymphadenopathy:     Cervical: No cervical adenopathy.  Skin:    General: Skin is warm and dry.  Neurological:     Mental Status: She is alert and oriented to person, place, and time.     Deep Tendon Reflexes: Reflexes are normal and symmetric.  Psychiatric:        Behavior: Behavior normal.        Thought Content: Thought content normal.         Judgment: Judgment normal.    BP (!) 141/91   Pulse (!) 106   Temp 97.8 F (36.6 C) (Temporal)   Resp 20   Ht '5\' 8"'$  (1.727 m)   Wt 184 lb (83.5 kg)   SpO2 97%   BMI 27.98 kg/m        Assessment & Plan:  SHANTE BLOUGH comes in today with chief complaint of Medical Management of Chronic Issues   Diagnosis and orders addressed:  1. Primary hypertension Low sodium diet - lisinopril (ZESTRIL) 10 MG tablet; Take 1 tablet (10 mg total) by mouth daily.  Dispense: 90 tablet; Refill: 3 - furosemide (LASIX) 20 MG tablet; Take 1 tablet (20 mg total) by mouth daily.  Dispense: 90 tablet; Refill: 1  2. Pure hypercholesterolemia Low fat diet - pravastatin (PRAVACHOL) 40 MG tablet; Take 1 tablet (40 mg total) by mouth daily.  Dispense: 90 tablet; Refill: 1  3. Gastroesophageal reflux disease, unspecified whether esophagitis present Avoid spicy foods Do not eat 2 hours prior to bedtime  4. Migraine with aura and without status migrainosus, not intractable Avoid caffeine - SUMAtriptan (IMITREX) 100 MG tablet; TAKE 1 TABLET AT ONSET OF HEADACHE, MAY REPEAT ONCE IN 2HOURS  Dispense: 9 tablet; Refill: 2  5. Chronic nausea Zofran as needed  6. Fibromyalgia Moist heat exercise - HYDROcodone-acetaminophen (NORCO) 10-325 MG tablet; Take 1 tablet by mouth 3 (three) times daily.  Dispense: 90 tablet; Refill: 0 - HYDROcodone-acetaminophen (NORCO) 10-325 MG tablet; Take 1 tablet by mouth 3 (three) times daily.  Dispense: 90 tablet; Refill: 0 - HYDROcodone-acetaminophen (NORCO) 10-325 MG tablet; Take 1 tablet by mouth 3 (three) times daily.  Dispense: 90 tablet; Refill: 0 - Milnacipran (SAVELLA) 50 MG TABS tablet; Take 1 tablet (50 mg total) by mouth 2 (two) times daily.  Dispense: 180 tablet; Refill: 1 - ketorolac (TORADOL) injection 60 mg  7. GAD (generalized anxiety  disorder) Stress management  8. Primary insomnia Bedtime routine - traZODone (DESYREL) 50 MG tablet; Take 0.5-1 tablets  (25-50 mg total) by mouth at bedtime as needed for sleep.  Dispense: 30 tablet; Refill: 3  9. BMI 26.0-26.9,adult Discussed diet and exercise for person with BMI >25 Will recheck weight in 3-6 months    Labs pending Health Maintenance reviewed Diet and exercise encouraged  Follow up plan: 3 months   Mary-Margaret Hassell Done, FNP

## 2021-03-13 ENCOUNTER — Other Ambulatory Visit: Payer: Self-pay | Admitting: Nurse Practitioner

## 2021-03-13 DIAGNOSIS — K219 Gastro-esophageal reflux disease without esophagitis: Secondary | ICD-10-CM

## 2021-03-13 MED ORDER — PANTOPRAZOLE SODIUM 40 MG PO TBEC: 40.0000 mg | DELAYED_RELEASE_TABLET | Freq: Two times a day (BID) | ORAL | 1 refills | Status: DC

## 2021-03-13 NOTE — Telephone Encounter (Signed)
Call from pharmacy Medication no longer on med list. She tried the [place] she was unable to take.  Was last seen 02/19/21, Next OV 04/12/21 Please advise

## 2021-03-13 NOTE — Addendum Note (Signed)
Addended by: Antonietta Barcelona D on: 03/13/2021 03:33 PM   Modules accepted: Orders

## 2021-04-10 ENCOUNTER — Ambulatory Visit: Payer: 59 | Admitting: Nurse Practitioner

## 2021-04-12 ENCOUNTER — Ambulatory Visit (INDEPENDENT_AMBULATORY_CARE_PROVIDER_SITE_OTHER): Payer: 59 | Admitting: Nurse Practitioner

## 2021-04-12 ENCOUNTER — Encounter: Payer: Self-pay | Admitting: Nurse Practitioner

## 2021-04-12 ENCOUNTER — Other Ambulatory Visit: Payer: Self-pay

## 2021-04-12 VITALS — BP 127/87 | HR 103 | Temp 97.9°F | Resp 20 | Ht 68.0 in | Wt 183.0 lb

## 2021-04-12 DIAGNOSIS — Z01818 Encounter for other preprocedural examination: Secondary | ICD-10-CM

## 2021-04-12 NOTE — Progress Notes (Signed)
Subjective:    Patient ID: [name], female    DOB: January 03, 1961, 60 y.o.   MRN: [id number]   Chief Complaint: surgical clearance   HPI Patient is scheduled to have a right total hip replacement the week of Thansgiving. Needs surgical clearance.    Review of Systems  Constitutional:  Negative for diaphoresis.  Eyes:  Negative for pain.  Respiratory:  Negative for shortness of breath.   Cardiovascular:  Negative for chest pain, palpitations and leg swelling.  Gastrointestinal:  Negative for abdominal pain.  Endocrine: Negative for polydipsia.  Skin:  Negative for rash.  Neurological:  Negative for dizziness, weakness and headaches.  Hematological:  Does not bruise/bleed easily.  All other systems reviewed and are negative.     Objective:   Physical Exam Vitals and nursing note reviewed.  Constitutional:      General: She is not in acute distress.    Appearance: Normal appearance. She is well-developed.  HENT:     Head: Normocephalic.     Right Ear: Tympanic membrane normal.     Left Ear: Tympanic membrane normal.     Nose: Nose normal.     Mouth/Throat:     Mouth: Mucous membranes are moist.  Eyes:     Pupils: Pupils are equal, round, and reactive to light.  Neck:     Vascular: No carotid bruit or JVD.  Cardiovascular:     Rate and Rhythm: Normal rate and regular rhythm.     Heart sounds: Normal heart sounds.  Pulmonary:     Effort: Pulmonary effort is normal. No respiratory distress.     Breath sounds: Normal breath sounds. No wheezing or rales.  Chest:     Chest wall: No tenderness.  Abdominal:     General: Bowel sounds are normal. There is no distension or abdominal bruit.     Palpations: Abdomen is soft. There is no hepatomegaly, splenomegaly, mass or pulsatile mass.     Tenderness: There is no abdominal tenderness.  Musculoskeletal:        General: Normal range of motion.     Cervical back: Normal range of motion and neck supple.  Lymphadenopathy:      Cervical: No cervical adenopathy.  Skin:    General: Skin is warm and dry.  Neurological:     Mental Status: She is alert and oriented to person, place, and time.     Deep Tendon Reflexes: Reflexes are normal and symmetric.  Psychiatric:        Behavior: Behavior normal.        Thought Content: Thought content normal.        Judgment: Judgment normal.    BP 127/87   Pulse (!) 103   Temp 97.9 F (36.6 C) (Temporal)   Resp 20   Ht 5\' 8"  (1.727 m)   Wt 183 lb (83 kg)   SpO2 99%   BMI 27.83 kg/m   Last EKG was done on 10/13/20- [facility]- Mary-Margaret Hassell Done, FNP       Assessment & Plan:   [name] in today with chief complaint of surgical clearance   1. Preoperative clearance Cleared for surgery  The above assessment and management plan was discussed with the patient. The patient verbalized understanding of and has agreed to the management plan. Patient is aware to call the clinic if symptoms persist or worsen. Patient is aware when to return to the clinic for a follow-up visit. Patient educated on when it is appropriate  to go to the emergency department.   Mary-Margaret Hassell Done, FNP

## 2021-04-12 NOTE — Patient Instructions (Signed)
Total Hip Replacement Total hip replacement is a surgery to replace your damaged hip joint. You may have this surgery to lessen your pain and to help your hip move better. Tell your doctor about: Any allergies you have. All medicines you are taking. This includes vitamins, herbs, eye drops, creams, and over-the-counter medicines. Any problems you or family members have had with anesthetic medicines. These medicines numb areas of your body or make you fall asleep for surgery. Any blood disorders you have. Any surgeries you have had. Any medical conditions you have. Whether you are pregnant or may be pregnant. What are the risks? In general, this is a safe surgery. But problems may occur, such as: Infection. Bleeding. Allergic reactions to medicines. Damage to nerves or other parts. Problems with your man-made joint (prosthesis). These may include: Your joint moving out of place or getting loose. Swelling and pressure buildup. A blood clot that can form in your leg and break loose and move to your lungs. A bone break, loss of movement, or lasting stiffness or pain. What happens before the procedure? Staying hydrated Follow instructions from your doctor about hydration. These may include: Up to 2 hours before the procedure - you may continue to drink clear liquids. These include water, clear fruit juice, black coffee, and plain tea.  Eating and drinking restrictions Follow instructions from your doctor about eating and drinking. These may include: 8 hours before the procedure - stop eating heavy meals or foods. These include meat, fried foods, or fatty foods. 6 hours before the procedure - stop eating light meals or foods. These include toast or cereal. 6 hours before the procedure - stop drinking milk or drinks that contain milk. 2 hours before the procedure - stop drinking clear liquids. Do not drink alcohol for 48 hours before your surgery. Medicines Ask your doctor about changing  or stopping: Your normal medicines. Vitamins, herbs, and supplements. Over-the-counter medicines. Do not take aspirin or ibuprofen unless you are told to. Tests You may have a physical exam. You may have tests, such as: X-rays or an MRI. An electrocardiogram (EKG). Blood or pee (urine) tests. General instructions Plan to have a responsible adult take you home from the hospital or clinic. Plan to have a responsible adult care for you for the time you are told after you leave the hospital or clinic. This is important. Get your home ready so you can be safe while you heal. Be sure you can reach all the things you need. Keep your body and teeth clean. Germs from anywhere in your body can infect your new joint. Tell your doctor: If you plan to have dental care and routine cleanings. If you get any skin infections. Avoid shaving your legs just before surgery. For your safety, your doctor may: [place] the area of surgery. Remove hair at the surgery site. Ask you to wash with a soap that kills germs. Give you antibiotic medicine. What happens during the procedure?  An IV tube will be put into one of your veins. You may be given: A sedative. This medicine helps you relax. A medicine to: Numb certain areas of your body. Make you fall asleep for surgery. Your doctor will make a cut (incision) in your hip. Then, your doctor will: Cut and take out damaged parts of your hip joint. Put a man-made hip joint into place. Do an X-ray of the new hip joint to make sure it is in the right place. Place a drain to take away  extra fluid, if needed. Close the incision and place a bandage over it. The procedure may vary among doctors and hospitals. What happens after the procedure? You will be monitored until you leave the hospital or clinic. This includes checking your blood pressure, heart rate, breathing rate, and blood oxygen level. Your health care team will check if you can move your foot and can  feel sensations in it. You will receive pain medicine. You may need to: Wear a type of socks that are tight (compression stockings). Take medicines to thin your blood (anticoagulants). You will do exercises to help your hip move better and get stronger until you are well enough to go home. You may need to use a walker, crutches, or a cane. You may need to use a wedge pillow (hip abduction pillow) when you are in bed. Summary Total hip replacement is a surgery to replace your damaged hip joint. Follow instructions from your doctor about eating and drinking before the procedure. Plan to have a responsible adult take you home from the hospital or clinic. You may need to wear a type of socks that are tight (compression stockings) after surgery. This information is not intended to replace advice given to you by your health care provider. Make sure you discuss any questions you have with your health care provider. Document Revised: 10/21/2019 Document Reviewed: 10/21/2019 Elsevier Patient Education  [place].

## 2021-05-21 ENCOUNTER — Ambulatory Visit (INDEPENDENT_AMBULATORY_CARE_PROVIDER_SITE_OTHER): Payer: 59 | Admitting: Nurse Practitioner

## 2021-05-21 ENCOUNTER — Encounter: Payer: Self-pay | Admitting: Nurse Practitioner

## 2021-05-21 VITALS — BP 137/85 | HR 110 | Temp 96.8°F | Resp 20 | Ht 68.0 in | Wt 184.0 lb

## 2021-05-21 DIAGNOSIS — E7800 Pure hypercholesterolemia, unspecified: Secondary | ICD-10-CM | POA: Diagnosis not present

## 2021-05-21 DIAGNOSIS — K219 Gastro-esophageal reflux disease without esophagitis: Secondary | ICD-10-CM | POA: Diagnosis not present

## 2021-05-21 DIAGNOSIS — I10 Essential (primary) hypertension: Secondary | ICD-10-CM

## 2021-05-21 DIAGNOSIS — M797 Fibromyalgia: Secondary | ICD-10-CM | POA: Diagnosis not present

## 2021-05-21 DIAGNOSIS — Z6826 Body mass index (BMI) 26.0-26.9, adult: Secondary | ICD-10-CM

## 2021-05-21 DIAGNOSIS — F5101 Primary insomnia: Secondary | ICD-10-CM

## 2021-05-21 DIAGNOSIS — F411 Generalized anxiety disorder: Secondary | ICD-10-CM

## 2021-05-21 MED ORDER — PRAVASTATIN SODIUM 40 MG PO TABS
40.0000 mg | ORAL_TABLET | Freq: Every day | ORAL | 1 refills | Status: DC
Start: 2021-05-21 — End: 2021-08-28

## 2021-05-21 MED ORDER — HYDROCODONE-ACETAMINOPHEN 10-325 MG PO TABS
1.0000 | ORAL_TABLET | Freq: Three times a day (TID) | ORAL | 0 refills | Status: DC
Start: 2021-05-21 — End: 2021-08-28

## 2021-05-21 MED ORDER — HYDROCODONE-ACETAMINOPHEN 10-325 MG PO TABS
1.0000 | ORAL_TABLET | Freq: Three times a day (TID) | ORAL | 0 refills | Status: DC
Start: 2021-07-20 — End: 2021-08-28

## 2021-05-21 MED ORDER — HYDROCODONE-ACETAMINOPHEN 10-325 MG PO TABS
1.0000 | ORAL_TABLET | Freq: Three times a day (TID) | ORAL | 0 refills | Status: DC
Start: 2021-06-20 — End: 2021-08-28

## 2021-05-21 MED ORDER — PANTOPRAZOLE SODIUM 40 MG PO TBEC: 40.0000 mg | DELAYED_RELEASE_TABLET | Freq: Two times a day (BID) | ORAL | 1 refills | Status: DC

## 2021-05-21 MED ORDER — SAVELLA 50 MG PO TABS
50.0000 mg | ORAL_TABLET | Freq: Two times a day (BID) | ORAL | 1 refills | Status: DC
Start: 2021-05-21 — End: 2021-08-28

## 2021-05-21 MED ORDER — TRAZODONE HCL 50 MG PO TABS: 25.0000 mg | ORAL_TABLET | Freq: Every evening | ORAL | 3 refills | Status: DC | PRN

## 2021-05-21 MED ORDER — FUROSEMIDE 20 MG PO TABS: 20.0000 mg | ORAL_TABLET | Freq: Every day | ORAL | 1 refills | Status: DC

## 2021-05-21 NOTE — Patient Instructions (Signed)
Muscle Pain, Adult ?Muscle pain, also called myalgia, is a condition in which a person has pain in one or more muscles in the body. Muscle pain may be mild, moderate, or severe. It may feel sharp, achy, or burning. In most cases, the pain lasts only a short time and goes away without treatment. ?Muscle pain can result from using muscles in a new or different way or after a period of inactivity. It is normal to feel some muscle pain after starting an exercise program. Muscles that have not been used often will be sore at first. ?What are the causes? ?This condition is caused by using muscles in a new or different way after a period of inactivity. Other causes may include: ?Overuse or muscle strain, especially if you are not in shape. This is the most common cause of muscle pain. ?Injury or bruising. ?Infectious diseases, including diseases caused by viruses, such as the flu (influenza). ?Fibromyalgia.This is a long-term, or chronic, condition that causes muscle tenderness, tiredness (fatigue), and headache. ?Autoimmune or rheumatologic diseases. These are conditions, such as lupus, in which the body's defense system (immunesystem) attacks areas in the body. ?Certain medicines, including ACE inhibitors and statins. ?What are the signs or symptoms? ?The main symptom of this condition is sore or painful muscles, including during activity and when stretching. You may also have slight swelling. ?How is this diagnosed? ?This condition is diagnosed with a physical exam. Your health care provider will ask questions about your pain and when it began. If you have not had muscle pain for very long, your health care provider may want to wait before doing much testing. If your muscle pain has lasted a long time, tests may be done right away. In some cases, this may include tests to rule out certain conditions or illnesses. ?How is this treated? ?Treatment for this condition depends on the cause. Home care is often enough to  relieve muscle pain. Your health care provider may also prescribe NSAIDs, such as ibuprofen. ?Follow these instructions at home: ?Medicines ?Take over-the-counter and prescription medicines only as told by your health care provider. ?Ask your health care provider if the medicine prescribed to you requires you to avoid driving or using machinery. ?Managing pain, swelling, and discomfort ?  ?If directed, put ice on the painful area. To do this: ?Put ice in a plastic bag. ?Place a towel between your skin and the bag. ?Leave the ice on for 20 minutes, 2-3 times a day. ?For the first 2 days of muscle soreness, or if there is swelling: ?Do not soak in hot baths. ?Do not use a hot tub, steam room, sauna, heating pad, or other heat source. ?After 48-72 hours, you may alternate between applying ice and applying heat as told by your health care provider. If directed, apply heat to the affected area as often as told by your health care provider. Use the heat source that your health care provider recommends, such as a moist heat pack or a heating pad. ?Place a towel between your skin and the heat source. ?Leave the heat on for 20-30 minutes. ?Remove the heat if your skin turns bright red. This is especially important if you are unable to feel pain, heat, or cold. You may have a greater risk of getting burned. ?If you have an injury, raise (elevate) the injured area above the level of your heart while you are sitting or lying down. ?Activity ? ?If overuse is causing your muscle pain: ?Slow down your activities   until the pain goes away. ?Do regular, gentle exercises if you are not usually active. ?Warm up before exercising. Stretch before and after exercising. This can help lower the risk of muscle pain. ?Do not continue working out if the pain is severe. Severe pain could mean that you have injured a muscle. ?Do not lift anything that is heavier than 5-10 lb (2.3-4.5 kg), or the limit that you are told, until your health care  provider says that it is safe. ?Return to your normal activities as told by your health care provider. Ask your health care provider what activities are safe for you. ?General instructions ?Do not use any products that contain nicotine or tobacco, such as cigarettes, e-cigarettes, and chewing tobacco. These can delay healing. If you need help quitting, ask your health care provider. ?Keep all follow-up visits as told by your health care provider. This is important. ?Contact a health care provider if you have: ?Muscle pain that gets worse and medicines do not help. ?Muscle pain that lasts longer than 3 days. ?A rash or fever along with muscle pain. ?Muscle pain after a tick bite. ?Muscle pain while working out, even though you are in good physical condition. ?Redness, soreness, or swelling along with muscle pain. ?Muscle pain after starting a new medicine or changing the dose of a medicine. ?Get help right away if you have: ?Trouble breathing. ?Trouble swallowing. ?Muscle pain along with a stiff neck, fever, and vomiting. ?Severe muscle weakness or you cannot move part of your body. ?These symptoms may represent a serious problem that is an emergency. Do not wait to see if the symptoms will go away. Get medical help right away. Call your local emergency services (911 in the [place] Do not drive yourself to the hospital. ?Summary ?Muscle pain usually lasts only a short time and goes away without treatment. ?This condition is caused by using muscles in a new or different way after a period of inactivity. ?If your muscle pain lasts longer than 3 days, tell your health care provider. ?This information is not intended to replace advice given to you by your health care provider. Make sure you discuss any questions you have with your health care provider. ?Document Revised: 12/15/2020 Document Reviewed: 12/15/2020 ?Elsevier Patient Education ? 2022 [employer]vier Inc[employer] ? ?

## 2021-05-21 NOTE — Progress Notes (Signed)
Subjective:    Patient ID: [name], female    DOB: 06/17/1960, 60 y.o.   MRN: [id number]   Chief Complaint: Medical Management of Chronic Issues    HPI:  1. Primary hypertension No c/o chest pain, sob or headache. Does not check blood pressure at home. BP Readings from Last 3 Encounters:  05/21/21 137/85  04/12/21 127/87  02/19/21 (!) 141/91     2. Pure hypercholesterolemia Does try to watch diet. Has been doing exercise daily for rehab from total knee replacement. Lab Results  Component Value Date   CHOL 194 06/16/2020   HDL 41 06/16/2020   LDLCALC 115 (H) 06/16/2020   TRIG 217 (H) 06/16/2020   CHOLHDL 4.7 (H) 06/16/2020     3. Gastroesophageal reflux disease, unspecified whether esophagitis present Is on protonix daily and is doing well.  4. Fibromyalgia Has daily pain but is doing well. Pain assessment: Cause of pain- fibromyalgia and S/P total hip replacement Pain location- right now now Pain on scale of 1-10- 6/10 Frequency- daily What increases pain-to much activity What makes pain Better-rest  Effects on ADL - none Any change in general medical condition-none  Current opioids rx- norco 10/325 TID # meds rx- 90 Effectiveness of current meds-helps Adverse reactions from pain meds-none Morphine equivalent- 30MME  Pill count performed-No Last drug screen - 11/10/20 ( high risk q74m, moderate risk q34m, low risk yearly ) Urine drug screen today- No Was the [place] reviewed- yes  If yes were their any concerning findings? - no   Overdose risk: 1 Opioid Risk  08/12/2019  Alcohol 0  Illegal Drugs 0  Rx Drugs 0  Alcohol 0  Illegal Drugs 0  Rx Drugs 0  Age between 16-45 years  0  History of Preadolescent Sexual Abuse 0  Psychological Disease 0  Depression 0  Opioid Risk Tool Scoring 0  Opioid Risk Interpretation Low Risk     Pain contract signed on:11/13/20   5. GAD (generalized anxiety disorder) GAD 7 : Generalized Anxiety Score 04/12/2021  02/19/2021 11/10/2020 03/06/2020  Nervous, Anxious, on Edge 0 1 1 0  Control/stop worrying 0 1 0 1  Worry too much - different things 0 1 0 1  Trouble relaxing 0 1 1 0  Restless 0 0 0 0  Easily annoyed or irritable 0 1 1 0  Afraid - awful might happen 0 0 0 0  Total GAD 7 Score 0 5 3 2   Anxiety Difficulty Not difficult at all Not difficult at all Somewhat difficult Somewhat difficult      6. Primary insomnia Trazadone helps her sleep. Sleep sabout 7-8 hours a night  7. BMI 26.0-26.9,adult No recent weight changes Wt Readings from Last 3 Encounters:  05/21/21 184 lb (83.5 kg)  04/12/21 183 lb (83 kg)  02/19/21 184 lb (83.5 kg)   BMI Readings from Last 3 Encounters:  05/21/21 27.98 kg/m  04/12/21 27.83 kg/m  02/19/21 27.98 kg/m       Outpatient Encounter Medications as of 05/21/2021  Medication Sig   calcium carbonate (OSCAL) 1500 (600 Ca) MG TABS tablet Take 600 mg of elemental calcium by mouth daily with breakfast.   Cholecalciferol (VITAMIN D3) 250 MCG (10000 UT) capsule Take 10,000 Units by mouth daily.   cyclobenzaprine (FLEXERIL) 10 MG tablet TAKE ONE TABLET THREE TIMES DAILY   EPINEPHrine 0.3 mg/0.3 mL IJ SOAJ injection Inject 0.3 mLs (0.3 mg total) into the muscle once.   estradiol (ESTRACE) 1 MG tablet Take  1 tablet (1 mg total) by mouth daily.   furosemide (LASIX) 20 MG tablet Take 1 tablet (20 mg total) by mouth daily.   loratadine (CLARITIN) 10 MG tablet Take 10 mg by mouth daily.   Milnacipran (SAVELLA) 50 MG TABS tablet Take 1 tablet (50 mg total) by mouth 2 (two) times daily.   Multiple Vitamin (MULTIVITAMIN) tablet Take 1 tablet by mouth daily.   ondansetron (ZOFRAN ODT) 4 MG disintegrating tablet Take 1 tablet (4 mg total) by mouth every 8 (eight) hours as needed for nausea or vomiting.   pantoprazole (PROTONIX) 40 MG tablet Take 1 tablet (40 mg total) by mouth 2 (two) times daily.   pravastatin (PRAVACHOL) 40 MG tablet Take 1 tablet (40 mg total) by mouth  daily.   SUMAtriptan (IMITREX) 100 MG tablet TAKE 1 TABLET AT ONSET OF HEADACHE, MAY REPEAT ONCE IN 2HOURS   traZODone (DESYREL) 50 MG tablet Take 0.5-1 tablets (25-50 mg total) by mouth at bedtime as needed for sleep.   HYDROcodone-acetaminophen (NORCO) 10-325 MG tablet Take 1 tablet by mouth 3 (three) times daily.   HYDROcodone-acetaminophen (NORCO) 10-325 MG tablet Take 1 tablet by mouth 3 (three) times daily.   HYDROcodone-acetaminophen (NORCO) 10-325 MG tablet Take 1 tablet by mouth 3 (three) times daily.   No facility-administered encounter medications on file as of 05/21/2021.    Past Surgical History:  Procedure Laterality Date   ABDOMINAL HYSTERECTOMY     ADNOIDS     ANTERIOR CERVICAL DECOMP/DISCECTOMY FUSION N/A 10/27/2013   Procedure: ACDF/ANTERIOR CERVICAL DECOMPRESSION/DISCECTOMY FUSION  C5-C7  (2 LEVELS);  Surgeon: Melina Schools, MD;  Location: [facility];  Service: Orthopedics;  Laterality: N/A;   COLONOSCOPY  05/26/2020   EYE SURGERY Bilateral    cataract removal   KNEE ARTHROSCOPY Right    TOTAL KNEE ARTHROPLASTY Right 06/30/2020   Procedure: TOTAL KNEE ARTHROPLASTY;  Surgeon: Netta Cedars, MD;  Location: [facility];  Service: Orthopedics;  Laterality: Right;   UPPER GASTROINTESTINAL ENDOSCOPY  05/26/2020    Family History  Problem Relation Age of Onset   Heart disease Mother    Cancer Father        STOMACH CANCER   Stomach cancer Father    Cancer Maternal Grandfather    Diabetes Paternal Grandmother    Rectal cancer Neg Hx    Esophageal cancer Neg Hx    Colon cancer Neg Hx     New complaints: None today  Social history: Lives by herself     Review of Systems  Constitutional:  Negative for diaphoresis.  Eyes:  Negative for pain.  Respiratory:  Negative for shortness of breath.   Cardiovascular:  Negative for chest pain, palpitations and leg swelling.  Gastrointestinal:  Negative for abdominal pain.  Endocrine: Negative for polydipsia.  Skin:  Negative for  rash.  Neurological:  Negative for dizziness, weakness and headaches.  Hematological:  Does not bruise/bleed easily.  All other systems reviewed and are negative.     Objective:   Physical Exam Vitals and nursing note reviewed.  Constitutional:      General: She is not in acute distress.    Appearance: Normal appearance. She is well-developed.  HENT:     Head: Normocephalic.     Right Ear: Tympanic membrane normal.     Left Ear: Tympanic membrane normal.     Nose: Nose normal.     Mouth/Throat:     Mouth: Mucous membranes are moist.  Eyes:     Pupils: Pupils are  equal, round, and reactive to light.  Neck:     Vascular: No carotid bruit or JVD.  Cardiovascular:     Rate and Rhythm: Normal rate and regular rhythm.     Heart sounds: Normal heart sounds.  Pulmonary:     Effort: Pulmonary effort is normal. No respiratory distress.     Breath sounds: Normal breath sounds. No wheezing or rales.  Chest:     Chest wall: No tenderness.  Abdominal:     General: Bowel sounds are normal. There is no distension or abdominal bruit.     Palpations: Abdomen is soft. There is no hepatomegaly, splenomegaly, mass or pulsatile mass.     Tenderness: There is no abdominal tenderness.  Musculoskeletal:        General: Normal range of motion.     Cervical back: Normal range of motion and neck supple.  Lymphadenopathy:     Cervical: No cervical adenopathy.  Skin:    General: Skin is warm and dry.  Neurological:     Mental Status: She is alert and oriented to person, place, and time.     Deep Tendon Reflexes: Reflexes are normal and symmetric.  Psychiatric:        Behavior: Behavior normal.        Thought Content: Thought content normal.        Judgment: Judgment normal.   BP 137/85   Pulse (!) 110   Temp (!) 96.8 F (36 C) (Temporal)   Resp 20   Ht 5\' 8"  (1.727 m)   Wt 184 lb (83.5 kg)   SpO2 100%   BMI 27.98 kg/m         Assessment & Plan:  SHELMA EIBEN comes in today with  chief complaint of Medical Management of Chronic Issues   Diagnosis and orders addressed:  1. Primary hypertension Low sodium diet - furosemide (LASIX) 20 MG tablet; Take 1 tablet (20 mg total) by mouth daily.  Dispense: 90 tablet; Refill: 1  2. Pure hypercholesterolemia Low fat diet - pravastatin (PRAVACHOL) 40 MG tablet; Take 1 tablet (40 mg total) by mouth daily.  Dispense: 90 tablet; Refill: 1  3. Gastroesophageal reflux disease, unspecified whether esophagitis present Avoid spicy foods Do not eat 2 hours prior to bedtime - pantoprazole (PROTONIX) 40 MG tablet; Take 1 tablet (40 mg total) by mouth 2 (two) times daily.  Dispense: 180 tablet; Refill: 1  4. Fibromyalgia Keep muscles warm - HYDROcodone-acetaminophen (NORCO) 10-325 MG tablet; Take 1 tablet by mouth 3 (three) times daily.  Dispense: 90 tablet; Refill: 0 - HYDROcodone-acetaminophen (NORCO) 10-325 MG tablet; Take 1 tablet by mouth 3 (three) times daily.  Dispense: 90 tablet; Refill: 0 - HYDROcodone-acetaminophen (NORCO) 10-325 MG tablet; Take 1 tablet by mouth 3 (three) times daily.  Dispense: 90 tablet; Refill: 0 - Milnacipran (SAVELLA) 50 MG TABS tablet; Take 1 tablet (50 mg total) by mouth 2 (two) times daily.  Dispense: 180 tablet; Refill: 1  5. GAD (generalized anxiety disorder) Stress management  6. Primary insomnia Bedtime routine - traZODone (DESYREL) 50 MG tablet; Take 0.5-1 tablets (25-50 mg total) by mouth at bedtime as needed for sleep.  Dispense: 30 tablet; Refill: 3  7. BMI 26.0-26.9,adult Discussed diet and exercise for person with BMI >25 Will recheck weight in 3-6 months     Labs pending Health Maintenance reviewed Diet and exercise encouraged  Follow up plan: 3 months   Mary-Margaret Hassell Done, FNP

## 2021-06-07 ENCOUNTER — Ambulatory Visit (INDEPENDENT_AMBULATORY_CARE_PROVIDER_SITE_OTHER): Payer: 59 | Admitting: Nurse Practitioner

## 2021-06-07 ENCOUNTER — Encounter: Payer: Self-pay | Admitting: Nurse Practitioner

## 2021-06-07 VITALS — BP 155/90 | HR 111 | Temp 96.1°F | Ht 68.0 in | Wt 184.6 lb

## 2021-06-07 DIAGNOSIS — H1033 Unspecified acute conjunctivitis, bilateral: Secondary | ICD-10-CM

## 2021-06-07 MED ORDER — POLYMYXIN B-TRIMETHOPRIM 10000-0.1 UNIT/ML-% OP SOLN: 2.0000 [drp] | OPHTHALMIC | 0 refills | Status: DC

## 2021-06-07 NOTE — Patient Instructions (Signed)
Bacterial Conjunctivitis, Adult Bacterial conjunctivitis is an infection of your conjunctiva. This is the clear membrane that covers the white part of your eye and the inner part of your eyelid. This infection can make your eye: Red or pink. Itchy or irritated. This condition spreads easily from person to person (is contagious) and from one eye to the other eye. What are the causes? This condition is caused by germs (bacteria). You may get the infection if you come into close contact with: A person who has the infection. Items that have germs on them (are contaminated), such as face towels, contact lens solution, or eye makeup. What increases the risk? You are more likely to get this condition if: You have contact with people who have the infection. You wear contact lenses. You have a sinus infection. You have had a recent eye injury or surgery. You have a weak body defense system (immune system). You have dry eyes. What are the signs or symptoms?  Thick, yellowish discharge from the eye. Tearing or watery eyes. Itchy eyes. Burning feeling in your eyes. Eye redness. Swollen eyelids. Blurred vision. How is this treated?  Antibiotic eye drops or ointment. Antibiotic medicine taken by mouth. This is used for infections that do not get better with drops or ointment or that last more than 10 days. Cool, wet cloths placed on the eyes. Artificial tears used 2-6 times a day. Follow these instructions at home: Medicines Take or apply your antibiotic medicine as told by your doctor. Do not stop using it even if you start to feel better. Take or apply over-the-counter and prescription medicines only as told by your doctor. Do not touch your eyelid with the eye-drop bottle or the ointment tube. Managing discomfort Wipe any fluid from your eye with a warm, wet washcloth or a cotton ball. Place a clean, cool, wet cloth on your eye. Do this for 10-20 minutes, 3-4 times a day. General  instructions Do not wear contacts until the infection is gone. Wear glasses until your doctor says it is okay to wear contacts again. Do not wear eye makeup until the infection is gone. Throw away old eye makeup. Change or wash your pillowcase every day. Do not share towels or washcloths. Wash your hands often with soap and water for at least 20 seconds and especially before touching your face or eyes. Use paper towels to dry your hands. Do not touch or rub your eyes. Do not drive or use heavy machinery if your vision is blurred. Contact a doctor if: You have a fever. You do not get better after 10 days. Get help right away if: You have a fever and your symptoms get worse all of a sudden. You have very bad pain when you move your eye. Your face: Hurts. Is red. Is swollen. You have sudden loss of vision. Summary Bacterial conjunctivitis is an infection of your conjunctiva. This infection spreads easily from person to person. Wash your hands often with soap and water for at least 20 seconds and especially before touching your face or eyes. Use paper towels to dry your hands. Take or apply your antibiotic medicine as told by your doctor. Contact a doctor if you have a fever or you do not get better after 10 days. This information is not intended to replace advice given to you by your health care provider. Make sure you discuss any questions you have with your health care provider. Document Revised: 09/06/2020 Document Reviewed: 09/06/2020 Elsevier Patient Education    2022 Elsevier Inc.  

## 2021-06-07 NOTE — Progress Notes (Signed)
° °  Subjective:    Patient ID: [name], female    DOB: [birth year], 60 y.o.   MRN: [id number]   Chief Complaint: Conjunctivitis (X 4 days - bilateral )   HPI Patient comes into today c/o bil red eye with eyes matted together. Has had for over 4 days.    Review of Systems  Constitutional:  Negative for diaphoresis.  Eyes:  Positive for photophobia, pain, discharge, redness and itching. Negative for visual disturbance.  Respiratory:  Negative for shortness of breath.   Cardiovascular:  Negative for chest pain, palpitations and leg swelling.  Gastrointestinal:  Negative for abdominal pain.  Endocrine: Negative for polydipsia.  Skin:  Negative for rash.  Neurological:  Negative for dizziness, weakness and headaches.  Hematological:  Does not bruise/bleed easily.  All other systems reviewed and are negative.     Objective:   Physical Exam Vitals reviewed.  Constitutional:      Appearance: Normal appearance.  Eyes:     General:        Right eye: Discharge present.        Left eye: Discharge present.    Pupils: Pupils are equal, round, and reactive to light.  Cardiovascular:     Rate and Rhythm: Normal rate and regular rhythm.     Heart sounds: Normal heart sounds.  Pulmonary:     Breath sounds: Normal breath sounds.  Skin:    General: Skin is warm.  Neurological:     General: No focal deficit present.     Mental Status: She is alert and oriented to person, place, and time.  Psychiatric:        Mood and Affect: Mood normal.        Behavior: Behavior normal.    BP (!) 155/90    Pulse (!) 111    Temp (!) 96.1 F (35.6 C) (Temporal)    Ht 5\' 8"  (1.727 m)    Wt 184 lb 9.6 oz (83.7 kg)    BMI 28.07 kg/m        Assessment & Plan:   CLATIE KESSEN in today with chief complaint of Conjunctivitis (X 4 days - bilateral )   1. Acute bacterial conjunctivitis of both eyes Cool compresses Good hand washing Meds ordered this encounter  Medications    trimethoprim-polymyxin b (POLYTRIM) ophthalmic solution    Sig: Place 2 drops into both eyes every 4 (four) hours.    Dispense:  10 mL    Refill:  0    Order Specific Question:   Supervising Provider    Answer:   Caryl Pina A [[id number]]       The above assessment and management plan was discussed with the patient. The patient verbalized understanding of and has agreed to the management plan. Patient is aware to call the clinic if symptoms persist or worsen. Patient is aware when to return to the clinic for a follow-up visit. Patient educated on when it is appropriate to go to the emergency department.   Mary-Margaret Hassell Done, FNP

## 2021-08-21 ENCOUNTER — Other Ambulatory Visit: Payer: Self-pay | Admitting: Nurse Practitioner

## 2021-08-21 DIAGNOSIS — M797 Fibromyalgia: Secondary | ICD-10-CM

## 2021-08-28 ENCOUNTER — Ambulatory Visit: Payer: 59 | Admitting: Nurse Practitioner

## 2021-08-28 ENCOUNTER — Encounter: Payer: Self-pay | Admitting: Nurse Practitioner

## 2021-08-28 VITALS — BP 132/78 | HR 97 | Temp 98.1°F | Resp 20 | Ht 68.0 in | Wt 185.0 lb

## 2021-08-28 DIAGNOSIS — M797 Fibromyalgia: Secondary | ICD-10-CM

## 2021-08-28 DIAGNOSIS — I10 Essential (primary) hypertension: Secondary | ICD-10-CM

## 2021-08-28 DIAGNOSIS — K219 Gastro-esophageal reflux disease without esophagitis: Secondary | ICD-10-CM

## 2021-08-28 DIAGNOSIS — M26653 Arthropathy of bilateral temporomandibular joint: Secondary | ICD-10-CM

## 2021-08-28 DIAGNOSIS — E7800 Pure hypercholesterolemia, unspecified: Secondary | ICD-10-CM | POA: Diagnosis not present

## 2021-08-28 DIAGNOSIS — F411 Generalized anxiety disorder: Secondary | ICD-10-CM

## 2021-08-28 DIAGNOSIS — Z6826 Body mass index (BMI) 26.0-26.9, adult: Secondary | ICD-10-CM

## 2021-08-28 DIAGNOSIS — N951 Menopausal and female climacteric states: Secondary | ICD-10-CM

## 2021-08-28 DIAGNOSIS — G43109 Migraine with aura, not intractable, without status migrainosus: Secondary | ICD-10-CM

## 2021-08-28 DIAGNOSIS — F5101 Primary insomnia: Secondary | ICD-10-CM

## 2021-08-28 MED ORDER — CYCLOBENZAPRINE HCL 10 MG PO TABS: ORAL_TABLET | ORAL | 1 refills | Status: DC

## 2021-08-28 MED ORDER — PRAVASTATIN SODIUM 40 MG PO TABS: 40.0000 mg | ORAL_TABLET | Freq: Every day | ORAL | 1 refills | Status: DC

## 2021-08-28 MED ORDER — HYDROCODONE-ACETAMINOPHEN 10-325 MG PO TABS: 1.0000 | ORAL_TABLET | Freq: Three times a day (TID) | ORAL | 0 refills | Status: DC

## 2021-08-28 MED ORDER — KETOROLAC TROMETHAMINE 60 MG/2ML IM SOLN
60.0000 mg | Freq: Once | INTRAMUSCULAR | Status: AC
  Administered 2021-08-28: 60 mg via INTRAMUSCULAR

## 2021-08-28 MED ORDER — PANTOPRAZOLE SODIUM 40 MG PO TBEC: 40.0000 mg | DELAYED_RELEASE_TABLET | Freq: Two times a day (BID) | ORAL | 1 refills | Status: DC

## 2021-08-28 MED ORDER — SUMATRIPTAN SUCCINATE 100 MG PO TABS: ORAL_TABLET | ORAL | 2 refills | Status: DC

## 2021-08-28 MED ORDER — SAVELLA 50 MG PO TABS: 50.0000 mg | ORAL_TABLET | Freq: Two times a day (BID) | ORAL | 1 refills | Status: DC

## 2021-08-28 MED ORDER — ESTRADIOL 1 MG PO TABS: 1.0000 mg | ORAL_TABLET | Freq: Every day | ORAL | 1 refills | Status: DC

## 2021-08-28 MED ORDER — FUROSEMIDE 20 MG PO TABS: 20.0000 mg | ORAL_TABLET | Freq: Every day | ORAL | 1 refills | Status: DC

## 2021-08-28 NOTE — Progress Notes (Signed)
? ?Subjective:  ? ? Patient ID: [name], female    DOB: [birth year], 61 y.o.   MRN: [id number] ? ? ?Chief Complaint: medical management of chronic issues  ?  ? ?HPI: ? ?[name] is a 61 y.o. who identifies as a female who was assigned female at birth.  ? ?Social history: ?Lives with: by herself ?Work history: works at a bank ? ? ?Comes in today for follow up of the following chronic medical issues: ? ?1. Primary hypertension ?Does not check blood pressure at home. No c/o chest pain, sob or headache. ?BP Readings from Last 3 Encounters:  ?08/28/21 132/78  ?06/07/21 (!) 155/90  ?05/21/21 137/85  ? ? ? ? ?2. Pure hypercholesterolemia ?Does not watch diet and does no dedicated exerciser. ?Lab Results  ?Component Value Date  ? CHOL 194 06/16/2020  ? HDL 41 06/16/2020  ? LDLCALC 115 (H) 06/16/2020  ? TRIG 217 (H) 06/16/2020  ? CHOLHDL 4.7 (H) 06/16/2020  ? ?The 10-year ASCVD risk score (Arnett DK, et al., 2019) is: 7.3% ? ? ?3. Gastroesophageal reflux disease, unspecified whether esophagitis present ?Is on protonix daily and is doing well ? ?4. GAD (generalized anxiety disorder) ?Has some anxiety but is on no meds due to her pain medication. ?GAD 7 : Generalized Anxiety Score 08/28/2021 04/12/2021 02/19/2021 11/10/2020  ?Nervous, Anxious, on Edge 0 0 1 1  ?Control/stop worrying 0 0 1 0  ?Worry too much - different things 0 0 1 0  ?Trouble relaxing 0 0 1 1  ?Restless 0 0 0 0  ?Easily annoyed or irritable 0 0 1 1  ?Afraid - awful might happen 0 0 0 0  ?Total GAD 7 Score 0 0 5 3  ?Anxiety Difficulty Not difficult at all Not difficult at all Not difficult at all Somewhat difficult  ? ? ? ? ?5. Primary insomnia ?Does not sleep well even on trazadone. ? ?6. Fibromyalgia ?Pain assessment: ?Cause of pain- fibromyalgia ?Pain location- pain location varies from day to day ?Pain on scale of 1-10- 7/10 ?Frequency- daily ?What increases pain-to mmuch walking and standing ?What makes pain Better-rest helps ?Effects on ADL -  none ?Any change in general medical condition-none ? ?Current opioids rx- norco 10/325 TID- some days only takes BID ?# meds rx- 82 ?Effectiveness of current meds-helps ?Adverse reactions from pain meds-none ?Morphine equivalent- 30 MME ? ?Pill count performed-No ?Last drug screen - 11/10/20 ?( high risk q64m moderate risk q635mlow risk yearly ) ?Urine drug screen today- No ?[place]- yes ? If yes were their any concerning findings? - no ? ? ?Overdose risk: yes ?Opioid Risk  08/12/2019  ?Alcohol 0  ?Illegal Drugs 0  ?Rx Drugs 0  ?Alcohol 0  ?Illegal Drugs 0  ?Rx Drugs 0  ?Age between 1636-45ears  0  ?History of Preadolescent Sexual Abuse 0  ?Psychological Disease 0  ?Depression 0  ?Opioid Risk Tool Scoring 0  ?Opioid Risk Interpretation Low Risk  ? ? ? ?Pain contract signed on:11/13/20 ? ? ?7. BMI 26.0-26.9,adult ?No recent weight changes ?Wt Readings from Last 3 Encounters:  ?08/28/21 185 lb (83.9 kg)  ?06/07/21 184 lb 9.6 oz (83.7 kg)  ?05/21/21 184 lb (83.5 kg)  ? ?BMI Readings from Last 3 Encounters:  ?08/28/21 28.13 kg/m?  ?06/07/21 28.07 kg/m?  ?05/21/21 27.98 kg/m?  ? ? ? ?New complaints: ?None today ? ?Allergies  ?Allergen Reactions  ? Other Anaphylaxis  ?  Plastic bottles; patient drank from  a water bottle and had to use Epi pen  ? Peanuts [Peanut Oil] Anaphylaxis  ? Statins Other (See Comments)  ?  Severe joint pain  ? Tape Other (See Comments)  ?  Causes red blisters on skin. Can use paper tape  ? ?Outpatient Encounter Medications as of 08/28/2021  ?Medication Sig  ? calcium carbonate (OSCAL) 1500 (600 Ca) MG TABS tablet Take 600 mg of elemental calcium by mouth daily with breakfast.  ? Cholecalciferol (VITAMIN D3) 250 MCG (10000 UT) capsule Take 10,000 Units by mouth daily.  ? cyclobenzaprine (FLEXERIL) 10 MG tablet TAKE ONE TABLET THREE TIMES DAILY  ? EPINEPHrine 0.3 mg/0.3 mL IJ SOAJ injection Inject 0.3 mLs (0.3 mg total) into the muscle once.  ? estradiol (ESTRACE) 1 MG tablet Take 1 tablet (1  mg total) by mouth daily.  ? furosemide (LASIX) 20 MG tablet Take 1 tablet (20 mg total) by mouth daily.  ? HYDROcodone-acetaminophen (NORCO) 10-325 MG tablet Take 1 tablet by mouth 3 (three) times daily.  ? HYDROcodone-acetaminophen (NORCO) 10-325 MG tablet Take 1 tablet by mouth 3 (three) times daily.  ? HYDROcodone-acetaminophen (NORCO) 10-325 MG tablet Take 1 tablet by mouth 3 (three) times daily.  ? loratadine (CLARITIN) 10 MG tablet Take 10 mg by mouth daily.  ? Milnacipran (SAVELLA) 50 MG TABS tablet Take 1 tablet (50 mg total) by mouth 2 (two) times daily.  ? Multiple Vitamin (MULTIVITAMIN) tablet Take 1 tablet by mouth daily.  ? ondansetron (ZOFRAN ODT) 4 MG disintegrating tablet Take 1 tablet (4 mg total) by mouth every 8 (eight) hours as needed for nausea or vomiting.  ? pantoprazole (PROTONIX) 40 MG tablet Take 1 tablet (40 mg total) by mouth 2 (two) times daily.  ? pravastatin (PRAVACHOL) 40 MG tablet Take 1 tablet (40 mg total) by mouth daily.  ? SUMAtriptan (IMITREX) 100 MG tablet TAKE 1 TABLET AT ONSET OF HEADACHE, MAY REPEAT ONCE IN 2HOURS  ? traZODone (DESYREL) 50 MG tablet Take 0.5-1 tablets (25-50 mg total) by mouth at bedtime as needed for sleep.  ? trimethoprim-polymyxin b (POLYTRIM) ophthalmic solution Place 2 drops into both eyes every 4 (four) hours.  ? ?No facility-administered encounter medications on file as of 08/28/2021.  ? ? ?Past Surgical History:  ?Procedure Laterality Date  ? ABDOMINAL HYSTERECTOMY    ? ADNOIDS    ? ANTERIOR CERVICAL DECOMP/DISCECTOMY FUSION N/A 10/27/2013  ? Procedure: ACDF/ANTERIOR CERVICAL DECOMPRESSION/DISCECTOMY FUSION  C5-C7  (2 LEVELS);  Surgeon: Melina Schools, MD;  Location: [facility];  Service: Orthopedics;  Laterality: N/A;  ? COLONOSCOPY  05/26/2020  ? EYE SURGERY Bilateral   ? cataract removal  ? KNEE ARTHROSCOPY Right   ? TOTAL KNEE ARTHROPLASTY Right 06/30/2020  ? Procedure: TOTAL KNEE ARTHROPLASTY;  Surgeon: Netta Cedars, MD;  Location: [facility];  Service:  Orthopedics;  Laterality: Right;  ? UPPER GASTROINTESTINAL ENDOSCOPY  05/26/2020  ? ? ?Family History  ?Problem Relation Age of Onset  ? Heart disease Mother   ? Cancer Father   ?     STOMACH CANCER  ? Stomach cancer Father   ? Cancer Maternal Grandfather   ? Diabetes Paternal Grandmother   ? Rectal cancer Neg Hx   ? Esophageal cancer Neg Hx   ? Colon cancer Neg Hx   ? ? ? ? ? ? ?Review of Systems  ?Constitutional:  Negative for diaphoresis.  ?Eyes:  Negative for pain.  ?Respiratory:  Negative for shortness of breath.   ?Cardiovascular:  Negative for  chest pain, palpitations and leg swelling.  ?Gastrointestinal:  Negative for abdominal pain.  ?Endocrine: Negative for polydipsia.  ?Skin:  Negative for rash.  ?Neurological:  Negative for dizziness, weakness and headaches.  ?Hematological:  Does not bruise/bleed easily.  ?All other systems reviewed and are negative. ? ?   ?Objective:  ? Physical Exam ?Vitals and nursing note reviewed.  ?Constitutional:   ?   General: She is not in acute distress. ?   Appearance: Normal appearance. She is well-developed.  ?HENT:  ?   Head: Normocephalic.  ?   Right Ear: Tympanic membrane normal.  ?   Left Ear: Tympanic membrane normal.  ?   Nose: Nose normal.  ?   Mouth/Throat:  ?   Mouth: Mucous membranes are moist.  ?Eyes:  ?   Pupils: Pupils are equal, round, and reactive to light.  ?Neck:  ?   Vascular: No carotid bruit or JVD.  ?Cardiovascular:  ?   Rate and Rhythm: Normal rate and regular rhythm.  ?   Heart sounds: Normal heart sounds.  ?Pulmonary:  ?   Effort: Pulmonary effort is normal. No respiratory distress.  ?   Breath sounds: Normal breath sounds. No wheezing or rales.  ?Chest:  ?   Chest wall: No tenderness.  ?Abdominal:  ?   General: Bowel sounds are normal. There is no distension or abdominal bruit.  ?   Palpations: Abdomen is soft. There is no hepatomegaly, splenomegaly, mass or pulsatile mass.  ?   Tenderness: There is no abdominal tenderness.  ?Musculoskeletal:     ?    General: Normal range of motion.  ?   Cervical back: Normal range of motion and neck supple.  ?Lymphadenopathy:  ?   Cervical: No cervical adenopathy.  ?Skin: ?   General: Skin is warm and dry.  ?Neurolog

## 2021-08-28 NOTE — Patient Instructions (Signed)

## 2021-11-09 ENCOUNTER — Ambulatory Visit: Payer: 59 | Admitting: Nurse Practitioner

## 2021-11-09 ENCOUNTER — Encounter: Payer: Self-pay | Admitting: Nurse Practitioner

## 2021-11-09 VITALS — BP 136/85 | HR 91 | Temp 98.1°F | Resp 20 | Ht 68.0 in | Wt 185.0 lb

## 2021-11-09 DIAGNOSIS — J029 Acute pharyngitis, unspecified: Secondary | ICD-10-CM | POA: Diagnosis not present

## 2021-11-09 LAB — RAPID STREP SCREEN (MED CTR MEBANE ONLY): Strep Gp A Ag, IA W/Reflex: POSITIVE — AB

## 2021-11-09 MED ORDER — PREDNISONE 20 MG PO TABS: 40.0000 mg | ORAL_TABLET | Freq: Every day | ORAL | 0 refills | Status: AC

## 2021-11-09 MED ORDER — AZITHROMYCIN 250 MG PO TABS: ORAL_TABLET | ORAL | 0 refills | Status: DC

## 2021-11-09 NOTE — Progress Notes (Signed)
Subjective:    Patient ID: [name], female    DOB: 07/10/1960, 61 y.o.   MRN: [id number]   Chief Complaint: Sore Throat and swollen neck   Sore Throat  This is a new problem. The current episode started in the past 7 days. The problem has been gradually worsening. There has been no fever. The pain is at a severity of 7/10. The pain is moderate. Associated symptoms include congestion, swollen glands and trouble swallowing. Pertinent negatives include no abdominal pain, coughing, headaches or shortness of breath. Strep: ?Marland Kitchen She has tried acetaminophen for the symptoms. The treatment provided mild relief.      Review of Systems  Constitutional:  Negative for diaphoresis.  HENT:  Positive for congestion and trouble swallowing.   Eyes:  Negative for pain.  Respiratory:  Negative for cough and shortness of breath.   Cardiovascular:  Negative for chest pain, palpitations and leg swelling.  Gastrointestinal:  Negative for abdominal pain.  Endocrine: Negative for polydipsia.  Skin:  Negative for rash.  Neurological:  Negative for dizziness, weakness and headaches.  Hematological:  Does not bruise/bleed easily.  All other systems reviewed and are negative.     Objective:   Physical Exam Vitals reviewed.  Constitutional:      Appearance: Normal appearance.  HENT:     Head: Normocephalic.     Right Ear: Tympanic membrane normal.     Left Ear: Tympanic membrane normal.     Nose: Congestion and rhinorrhea present.     Mouth/Throat:     Mouth: Mucous membranes are moist.     Pharynx: Pharyngeal swelling present.     Tonsils: 1+ on the right. 1+ on the left.  Eyes:     Extraocular Movements: Extraocular movements intact.     Pupils: Pupils are equal, round, and reactive to light.  Musculoskeletal:     Cervical back: Normal range of motion.  Lymphadenopathy:     Cervical: Cervical adenopathy present.  Neurological:     Mental Status: She is alert.    BP 136/85   Pulse 91    Temp 98.1 F (36.7 C) (Temporal)   Resp 20   Ht '5\' 8"'$  (1.727 m)   Wt 185 lb (83.9 kg)   SpO2 98%   BMI 28.13 kg/m        Assessment & Plan:   [name] in today with chief complaint of Sore Throat and swollen neck   1. Sore throat Force fluids Motrin or tylenol OTC OTC decongestant Throat lozenges if help New toothbrush in 3 days  - Rapid Strep Screen ([facility] ONLY)  Meds ordered this encounter  Medications   azithromycin (ZITHROMAX Z-PAK) 250 MG tablet    Sig: As directed    Dispense:  6 tablet    Refill:  0    Order Specific Question:   Supervising Provider    Answer:   Caryl Pina A [[id number]]   predniSONE (DELTASONE) 20 MG tablet    Sig: Take 2 tablets (40 mg total) by mouth daily with breakfast for 5 days. 2 po daily for 5 days    Dispense:  10 tablet    Refill:  0    Order Specific Question:   Supervising Provider    Answer:   Caryl Pina A [[id number]]     The above assessment and management plan was discussed with the patient. The patient verbalized understanding of and has agreed to the management plan. Patient is aware  to call the clinic if symptoms persist or worsen. Patient is aware when to return to the clinic for a follow-up visit. Patient educated on when it is appropriate to go to the emergency department.   Mary-Margaret Hassell Done, FNP

## 2021-11-09 NOTE — Patient Instructions (Signed)

## 2021-12-03 ENCOUNTER — Ambulatory Visit: Payer: 59 | Admitting: Nurse Practitioner

## 2021-12-03 ENCOUNTER — Encounter: Payer: Self-pay | Admitting: Nurse Practitioner

## 2021-12-03 VITALS — BP 125/84 | HR 124 | Temp 97.9°F | Resp 20 | Ht 68.0 in | Wt 185.0 lb

## 2021-12-03 DIAGNOSIS — M797 Fibromyalgia: Secondary | ICD-10-CM

## 2021-12-03 DIAGNOSIS — G43109 Migraine with aura, not intractable, without status migrainosus: Secondary | ICD-10-CM

## 2021-12-03 DIAGNOSIS — K219 Gastro-esophageal reflux disease without esophagitis: Secondary | ICD-10-CM | POA: Diagnosis not present

## 2021-12-03 DIAGNOSIS — F411 Generalized anxiety disorder: Secondary | ICD-10-CM

## 2021-12-03 DIAGNOSIS — E7800 Pure hypercholesterolemia, unspecified: Secondary | ICD-10-CM

## 2021-12-03 DIAGNOSIS — I10 Essential (primary) hypertension: Secondary | ICD-10-CM

## 2021-12-03 DIAGNOSIS — F5101 Primary insomnia: Secondary | ICD-10-CM

## 2021-12-03 DIAGNOSIS — Z0289 Encounter for other administrative examinations: Secondary | ICD-10-CM

## 2021-12-03 DIAGNOSIS — M26653 Arthropathy of bilateral temporomandibular joint: Secondary | ICD-10-CM

## 2021-12-03 DIAGNOSIS — Z6826 Body mass index (BMI) 26.0-26.9, adult: Secondary | ICD-10-CM

## 2021-12-03 MED ORDER — PANTOPRAZOLE SODIUM 40 MG PO TBEC: 40.0000 mg | DELAYED_RELEASE_TABLET | Freq: Two times a day (BID) | ORAL | 1 refills | Status: DC

## 2021-12-03 MED ORDER — KETOROLAC TROMETHAMINE 60 MG/2ML IM SOLN
60.0000 mg | Freq: Once | INTRAMUSCULAR | Status: AC
  Administered 2021-12-03: 60 mg via INTRAMUSCULAR

## 2021-12-03 MED ORDER — HYDROCODONE-ACETAMINOPHEN 10-325 MG PO TABS: 1.0000 | ORAL_TABLET | Freq: Three times a day (TID) | ORAL | 0 refills | Status: DC

## 2021-12-03 MED ORDER — PRAVASTATIN SODIUM 40 MG PO TABS: 40.0000 mg | ORAL_TABLET | Freq: Every day | ORAL | 1 refills | Status: DC

## 2021-12-03 MED ORDER — CYCLOBENZAPRINE HCL 10 MG PO TABS: ORAL_TABLET | ORAL | 1 refills | Status: DC

## 2021-12-03 MED ORDER — FUROSEMIDE 20 MG PO TABS: 20.0000 mg | ORAL_TABLET | Freq: Every day | ORAL | 1 refills | Status: DC

## 2022-03-05 ENCOUNTER — Ambulatory Visit: Payer: 59 | Admitting: Nurse Practitioner

## 2022-03-05 ENCOUNTER — Encounter: Payer: Self-pay | Admitting: Nurse Practitioner

## 2022-03-05 VITALS — BP 146/77 | HR 98 | Temp 98.3°F | Ht 68.0 in | Wt 182.4 lb

## 2022-03-05 DIAGNOSIS — M797 Fibromyalgia: Secondary | ICD-10-CM

## 2022-03-05 DIAGNOSIS — F411 Generalized anxiety disorder: Secondary | ICD-10-CM

## 2022-03-05 DIAGNOSIS — F5101 Primary insomnia: Secondary | ICD-10-CM

## 2022-03-05 DIAGNOSIS — E7800 Pure hypercholesterolemia, unspecified: Secondary | ICD-10-CM

## 2022-03-05 DIAGNOSIS — K219 Gastro-esophageal reflux disease without esophagitis: Secondary | ICD-10-CM

## 2022-03-05 DIAGNOSIS — I10 Essential (primary) hypertension: Secondary | ICD-10-CM

## 2022-03-05 DIAGNOSIS — Z6826 Body mass index (BMI) 26.0-26.9, adult: Secondary | ICD-10-CM

## 2022-03-05 MED ORDER — HYDROCODONE-ACETAMINOPHEN 10-325 MG PO TABS: 1.0000 | ORAL_TABLET | Freq: Three times a day (TID) | ORAL | 0 refills | Status: DC

## 2022-03-05 MED ORDER — PANTOPRAZOLE SODIUM 40 MG PO TBEC: 40.0000 mg | DELAYED_RELEASE_TABLET | Freq: Two times a day (BID) | ORAL | 1 refills | Status: DC

## 2022-03-05 MED ORDER — PRAVASTATIN SODIUM 40 MG PO TABS: 40.0000 mg | ORAL_TABLET | Freq: Every day | ORAL | 1 refills | Status: DC

## 2022-03-05 MED ORDER — KETOROLAC TROMETHAMINE 60 MG/2ML IM SOLN
60.0000 mg | Freq: Once | INTRAMUSCULAR | Status: AC
  Administered 2022-03-05: 60 mg via INTRAMUSCULAR

## 2022-03-05 MED ORDER — SAVELLA 50 MG PO TABS: 50.0000 mg | ORAL_TABLET | Freq: Two times a day (BID) | ORAL | 1 refills | Status: DC

## 2022-03-05 MED ORDER — FUROSEMIDE 20 MG PO TABS: 20.0000 mg | ORAL_TABLET | Freq: Every day | ORAL | 1 refills | Status: DC

## 2022-03-05 MED ORDER — ONDANSETRON 4 MG PO TBDP: 4.0000 mg | ORAL_TABLET | Freq: Three times a day (TID) | ORAL | 1 refills | Status: DC | PRN

## 2022-03-05 NOTE — Progress Notes (Signed)
Subjective:    Patient ID: [name], female    DOB: 1960-12-29, 61 y.o.   MRN: [id number]  Chief Complaint: medical management of chronic issues     HPI:  [name] is a 61 y.o. who identifies as a female who was assigned female at birth.   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   Comes in today for follow up of the following chronic medical issues:  1. Primary hypertension No c/o chest pain,sob or headache. Does not check blood pressure at home. BP Readings from Last 3 Encounters:  12/03/21 125/84  11/09/21 136/85  08/28/21 132/78    2. Pure hypercholesterolemia Does try to watch diet but does no dedicated exercise. Lab Results  Component Value Date   CHOL 194 06/16/2020   HDL 41 06/16/2020   LDLCALC 115 (H) 06/16/2020   TRIG 217 (H) 06/16/2020   CHOLHDL 4.7 (H) 06/16/2020   The 10-year ASCVD risk score (Arnett DK, et al., 2019) is: 8.9%    3. Gastroesophageal reflux disease, unspecified whether esophagitis present Is on protonix which works well to keep her symptoms under control  4. Fibromyalgia Has constant pain. Pain assessment: Cause of pain- fibromyalgia Pain on scale of 1-10- 7-8/10  Frequency- daily What increases pain-nothing really What makes pain Better-rest helps some Effects on ADL - none Any change in general medical condition-none  Current opioids rx- norco # meds rx- 90 Effectiveness of current meds-helps Adverse reactions from pain meds-none Morphine equivalent- 30 MME  Pill count performed-No Last drug screen - 11/2020 ( high risk q87m moderate risk q643mlow risk yearly ) Urine drug screen today- Yes [place]  If yes were their any concerning findings? - no   Overdose risk: 1    08/12/2019    9:46 AM  Opioid Risk   Alcohol 0  Illegal Drugs 0  Rx Drugs 0  Alcohol 0  Illegal Drugs 0  Rx Drugs 0  Age between 16-45 years  0  History of  Preadolescent Sexual Abuse 0  Psychological Disease 0  Depression 0  Opioid Risk Tool Scoring 0  Opioid Risk Interpretation Low Risk     Pain contract signed on:11/29/21   5. GAD (generalized anxiety disorder) Doing well    12/03/2021    8:40 AM 08/28/2021    9:08 AM 04/12/2021   10:28 AM 02/19/2021    2:01 PM  GAD 7 : Generalized Anxiety Score  Nervous, Anxious, on Edge 0 0 0 1  Control/stop worrying 0 0 0 1  Worry too much - different things 0 0 0 1  Trouble relaxing 0 0 0 1  Restless 0 0 0 0  Easily annoyed or irritable 0 0 0 1  Afraid - awful might happen 0 0 0 0  Total GAD 7 Score 0 0 0 5  Anxiety Difficulty Not difficult at all Not difficult at all Not difficult at all Not difficult at all      6. Primary insomnia Sleep about 7-8 hours a night  7. BMI 26.0-26.9,adult Weight is down 3 lbs Wt Readings from Last 3 Encounters:  03/05/22 182 lb 6 oz (82.7 kg)  12/03/21 185 lb (83.9 kg)  11/09/21 185 lb (83.9 kg)   BMI Readings from Last 3 Encounters:  03/05/22 27.73 kg/m  12/03/21 28.13 kg/m  11/09/21 28.13 kg/m      New complaints: None today  Allergies  Allergen Reactions   Other Anaphylaxis    Plastic bottles; patient drank from a water bottle and had to use Epi pen   Peanuts [Peanut Oil] Anaphylaxis   Statins Other (See Comments)    Severe joint pain   Tape Other (See Comments)    Causes red blisters on skin. Can use paper tape   Outpatient Encounter Medications as of 03/05/2022  Medication Sig   calcium carbonate (OSCAL) 1500 (600 Ca) MG TABS tablet Take 600 mg of elemental calcium by mouth daily with breakfast.   Cholecalciferol (VITAMIN D3) 250 MCG (10000 UT) capsule Take 10,000 Units by mouth daily.   cyclobenzaprine (FLEXERIL) 10 MG tablet TAKE ONE TABLET THREE TIMES DAILY   EPINEPHrine 0.3 mg/0.3 mL IJ SOAJ injection Inject 0.3 mLs (0.3 mg total) into the muscle once.   furosemide (LASIX) 20 MG tablet Take 1 tablet (20 mg total) by mouth  daily.   HYDROcodone-acetaminophen (NORCO) 10-325 MG tablet Take 1 tablet by mouth 3 (three) times daily.   HYDROcodone-acetaminophen (NORCO) 10-325 MG tablet Take 1 tablet by mouth 3 (three) times daily.   HYDROcodone-acetaminophen (NORCO) 10-325 MG tablet Take 1 tablet by mouth 3 (three) times daily.   loratadine (CLARITIN) 10 MG tablet Take 10 mg by mouth daily.   Milnacipran (SAVELLA) 50 MG TABS tablet Take 1 tablet (50 mg total) by mouth 2 (two) times daily.   Multiple Vitamin (MULTIVITAMIN) tablet Take 1 tablet by mouth daily.   ondansetron (ZOFRAN ODT) 4 MG disintegrating tablet Take 1 tablet (4 mg total) by mouth every 8 (eight) hours as needed for nausea or vomiting.   pantoprazole (PROTONIX) 40 MG tablet Take 1 tablet (40 mg total) by mouth 2 (two) times daily.   pravastatin (PRAVACHOL) 40 MG tablet Take 1 tablet (40 mg total) by mouth daily.   SUMAtriptan (IMITREX) 100 MG tablet TAKE 1 TABLET AT ONSET OF HEADACHE, MAY REPEAT ONCE IN 2HOURS   No facility-administered encounter medications on file as of 03/05/2022.    Past Surgical History:  Procedure Laterality Date   ABDOMINAL HYSTERECTOMY     ADNOIDS     ANTERIOR CERVICAL DECOMP/DISCECTOMY FUSION N/A 10/27/2013   Procedure: ACDF/ANTERIOR CERVICAL DECOMPRESSION/DISCECTOMY FUSION  C5-C7  (2 LEVELS);  Surgeon: Melina Schools, MD;  Location: [facility];  Service: Orthopedics;  Laterality: N/A;   COLONOSCOPY  05/26/2020   EYE SURGERY Bilateral    cataract removal   KNEE ARTHROSCOPY Right    TOTAL KNEE ARTHROPLASTY Right 06/30/2020   Procedure: TOTAL KNEE ARTHROPLASTY;  Surgeon: Netta Cedars, MD;  Location: [facility];  Service: Orthopedics;  Laterality: Right;   UPPER GASTROINTESTINAL ENDOSCOPY  05/26/2020    Family History  Problem Relation Age of Onset   Heart disease Mother    Cancer Father        STOMACH CANCER   Stomach cancer Father    Cancer Maternal Grandfather    Diabetes Paternal Grandmother    Rectal cancer Neg Hx     Esophageal cancer Neg Hx    Colon cancer Neg Hx         Review of Systems  Constitutional:  Negative for diaphoresis.  Eyes:  Negative for pain.  Respiratory:  Negative for shortness of breath.   Cardiovascular:  Negative for chest pain, palpitations and leg swelling.  Gastrointestinal:  Negative for abdominal pain.  Endocrine: Negative for polydipsia.  Skin:  Negative for rash.  Neurological:  Negative for dizziness, weakness and headaches.  Hematological:  Does not bruise/bleed easily.  All other systems reviewed and are negative.      Objective:   Physical Exam Vitals and nursing note reviewed.  Constitutional:      General: She is not in acute distress.    Appearance: Normal appearance. She is well-developed.  HENT:     Head: Normocephalic.     Right Ear: Tympanic membrane normal.     Left Ear: Tympanic membrane normal.     Nose: Nose normal.     Mouth/Throat:     Mouth: Mucous membranes are moist.  Eyes:     Pupils: Pupils are equal, round, and reactive to light.  Neck:     Vascular: No carotid bruit or JVD.  Cardiovascular:     Rate and Rhythm: Normal rate and regular rhythm.     Heart sounds: Normal heart sounds.  Pulmonary:     Effort: Pulmonary effort is normal. No respiratory distress.     Breath sounds: Normal breath sounds. No wheezing or rales.  Chest:     Chest wall: No tenderness.  Abdominal:     General: Bowel sounds are normal. There is no distension or abdominal bruit.     Palpations: Abdomen is soft. There is no hepatomegaly, splenomegaly, mass or pulsatile mass.     Tenderness: There is no abdominal tenderness.  Musculoskeletal:        General: Normal range of motion.     Cervical back: Normal range of motion and neck supple.  Lymphadenopathy:     Cervical: No cervical adenopathy.  Skin:    General: Skin is warm and dry.  Neurological:     Mental Status: She is alert and oriented to person, place, and time.     Deep Tendon Reflexes:  Reflexes are normal and symmetric.  Psychiatric:        Behavior: Behavior normal.        Thought Content: Thought content normal.        Judgment: Judgment normal.    BP (!) 146/77   Pulse 98   Temp 98.3 F (36.8 C) (Temporal)   Ht '5\' 8"'$  (1.727 m)   Wt 182 lb 6 oz (82.7 kg)   BMI 27.73 kg/m         Assessment & Plan:  ONDA KATTNER comes in today with chief complaint of Medical Management of Chronic Issues   Diagnosis and orders addressed:  1. Primary hypertension Low sodium diet - furosemide (LASIX) 20 MG tablet; Take 1 tablet (20 mg total) by mouth daily.  Dispense: 90 tablet; Refill: 1  2. Pure hypercholesterolemia Low fta diet - pravastatin (PRAVACHOL) 40 MG tablet; Take 1 tablet (40 mg total) by mouth daily.  Dispense: 90 tablet; Refill: 1  3. Gastroesophageal reflux disease, unspecified whether esophagitis present Avoid spicy foods Do not eat 2 hours prior to bedtime  - pantoprazole (PROTONIX) 40 MG tablet; Take 1 tablet (40 mg total) by mouth 2 (two) times daily.  Dispense: 180 tablet; Refill: 1  4. Fibromyalgia Exercise to keep muscles warm - HYDROcodone-acetaminophen (NORCO) 10-325 MG tablet; Take 1 tablet by mouth 3 (three) times daily.  Dispense: 90 tablet; Refill: 0 - HYDROcodone-acetaminophen (NORCO) 10-325 MG tablet; Take 1 tablet by mouth 3 (three) times daily.  Dispense: 90 tablet; Refill: 0 - HYDROcodone-acetaminophen (NORCO) 10-325 MG tablet; Take 1 tablet by mouth 3 (three) times daily.  Dispense: 90 tablet; Refill: 0 - ketorolac (TORADOL) injection 60 mg - Milnacipran (SAVELLA) 50 MG TABS tablet; Take 1 tablet (50 mg total)  by mouth 2 (two) times daily.  Dispense: 180 tablet; Refill: 1 - ToxASSURE Select 13 (MW), Urine  5. GAD (generalized anxiety disorder) Stress management  6. Primary insomnia Bedtime routine  7. BMI 26.0-26.9,adult Discussed diet and exercise for person with BMI >25 Will recheck weight in 3-6 months    Labs  pending Health Maintenance reviewed Diet and exercise encouraged  Follow up plan: 3 months   Mary-Margaret Hassell Done, FNP

## 2022-03-15 NOTE — Progress Notes (Addendum)
COVID Vaccine Completed: no  Date of COVID positive in last 90 days: no  PCP - Mary-Margaret Hassell Done, FNP Cardiologist - n/a  Chest x-ray - n/a EKG - 03/18/22 Epic/chart Stress Test - n/a ECHO - n/a Cardiac Cath - n/a Pacemaker/ICD device last checked: n/a Spinal Cord Stimulator: n/a  Bowel Prep - no  Sleep Study - n/a CPAP -   Fasting Blood Sugar - n/a Checks Blood Sugar _____ times a day  Blood Thinner Instructions: n/a Aspirin Instructions: Last Dose:  Activity level: Can go up a flight of stairs and perform activities of daily living without stopping and without symptoms of chest pain or shortness of breath.  Anesthesia review: HTN, Gillian-Barre  Patient denies shortness of breath, fever, cough and chest pain at PAT appointment  Patient verbalized understanding of instructions that were given to them at the PAT appointment. Patient was also instructed that they will need to review over the PAT instructions again at home before surgery.

## 2022-03-15 NOTE — Patient Instructions (Addendum)
SURGICAL WAITING ROOM VISITATION Patients having surgery or a procedure may have no more than 2 support people in the waiting area - these visitors may rotate.   Children under the age of 72 must have an adult with them who is not the patient. If the patient needs to stay at the hospital during part of their recovery, the visitor guidelines for inpatient rooms apply. Pre-op nurse will coordinate an appropriate time for 1 support person to accompany patient in pre-op.  This support person may not rotate.    Please refer to the [facility] website for the visitor guidelines for Inpatients (after your surgery is over and you are in a regular room).    Your procedure is scheduled on: 03/28/22   Report to [facility] Main Entrance    Report to admitting at 9:15 AM   Call this number if you have problems the morning of surgery [phone]   Do not eat food :After Midnight.   After Midnight you may have the following liquids until 8:45 AM DAY OF SURGERY  Water Non-Citrus Juices (without pulp, NO RED) Carbonated Beverages Black Coffee (NO MILK/CREAM OR CREAMERS, sugar ok)  Clear Tea (NO MILK/CREAM OR CREAMERS, sugar ok) regular and decaf                             Plain Jell-O (NO RED)                                           Fruit ices (not with fruit pulp, NO RED)                                     Popsicles (NO RED)                                                               Sports drinks like Gatorade (NO RED)              Drink 2 Ensure/G2 drinks AT 10:00 PM the night before surgery.        The day of surgery:  Drink ONE (1) Pre-Surgery Clear Ensure at 8:45 AM the morning of surgery. Drink in one sitting. Do not sip.  This drink was given to you during your hospital  pre-op appointment visit. Nothing else to drink after completing the  Pre-Surgery Clear Ensure.          If you have questions, please contact your surgeon's office.   FOLLOW BOWEL PREP AND ANY  ADDITIONAL PRE OP INSTRUCTIONS YOU RECEIVED FROM YOUR SURGEON'S OFFICE!!!     Oral Hygiene is also important to reduce your risk of infection.                                    Remember - BRUSH YOUR TEETH THE MORNING OF SURGERY WITH YOUR REGULAR TOOTHPASTE   Take these medicines the morning of surgery with A SIP OF WATER: Norco, Pantoprazole, Pravastatin  You may not have any metal on your body including hair pins, jewelry, and body piercing             Do not wear make-up, lotions, powders, perfumes, or deodorant  Do not wear nail polish including gel and S&S, artificial/acrylic nails, or any other type of covering on natural nails including finger and toenails. If you have artificial nails, gel coating, etc. that needs to be removed by a nail salon please have this removed prior to surgery or surgery may need to be canceled/ delayed if the surgeon/ anesthesia feels like they are unable to be safely monitored.   Do not shave  48 hours prior to surgery.    Do not bring valuables to the hospital. [name].  DO NOT [name]. PHARMACY WILL DISPENSE MEDICATIONS LISTED ON YOUR MEDICATION LIST TO YOU DURING YOUR ADMISSION [place]!    Patients discharged on the day of surgery will not be allowed to drive home.  Someone NEEDS to stay with you for the first 24 hours after anesthesia.              Please read over the following fact sheets you were given: IF [place] [name]   If you received a COVID test during your pre-op visit  it is requested that you wear a mask when out in public, stay away from anyone that may not be feeling well and notify your surgeon if you develop symptoms. If you test positive for Covid or have been in contact with anyone that has tested positive in the last 10 days please notify you  surgeon.     [place] - Preparing for Surgery Before surgery, you can play an important role.  Because skin is not sterile, your skin needs to be as free of germs as possible.  You can reduce the number of germs on your skin by washing with CHG (chlorahexidine gluconate) soap before surgery.  CHG is an antiseptic cleaner which kills germs and bonds with the skin to continue killing germs even after washing. Please DO NOT use if you have an allergy to CHG or antibacterial soaps.  If your skin becomes reddened/irritated stop using the CHG and inform your nurse when you arrive at Short Stay. Do not shave (including legs and underarms) for at least 48 hours prior to the first CHG shower.  You may shave your face/neck.  Please follow these instructions carefully:  1.  Shower with CHG Soap the night before surgery and the  morning of surgery.  2.  If you choose to wash your hair, wash your hair first as usual with your normal  shampoo.  3.  After you shampoo, rinse your hair and body thoroughly to remove the shampoo.                             4.  Use CHG as you would any other liquid soap.  You can apply chg directly to the skin and wash.  Gently with a scrungie or clean washcloth.  5.  Apply the CHG Soap to your body ONLY FROM THE NECK DOWN.   Do   not use on face/ open  Wound or open sores. Avoid contact with eyes, ears mouth and   genitals (private parts).                       Wash face,  Genitals (private parts) with your normal soap.             6.  Wash thoroughly, paying special attention to the area where your    surgery  will be performed.  7.  Thoroughly rinse your body with warm water from the neck down.  8.  DO NOT shower/wash with your normal soap after using and rinsing off the CHG Soap.                9.  Pat yourself dry with a clean towel.            10.  Wear clean pajamas.            11.  Place clean sheets on your bed the night of your first shower and  do not  sleep with pets. Day of Surgery : Do not apply any lotions/deodorants the morning of surgery.  Please wear clean clothes to the hospital/surgery center.  FAILURE TO FOLLOW THESE INSTRUCTIONS MAY RESULT IN THE CANCELLATION OF YOUR SURGERY  PATIENT SIGNATURE_________________________________  NURSE SIGNATURE__________________________________  ________________________________________________________________________   [name]  An incentive spirometer is a tool that can help keep your lungs clear and active. This tool measures how well you are filling your lungs with each breath. Taking long deep breaths may help reverse or decrease the chance of developing breathing (pulmonary) problems (especially infection) following: A long period of time when you are unable to move or be active. BEFORE THE PROCEDURE  If the spirometer includes an indicator to show your best effort, your nurse or respiratory therapist will set it to a desired goal. If possible, sit up straight or lean slightly forward. Try not to slouch. Hold the incentive spirometer in an upright position. INSTRUCTIONS FOR USE  Sit on the edge of your bed if possible, or sit up as far as you can in bed or on a chair. Hold the incentive spirometer in an upright position. Breathe out normally. Place the mouthpiece in your mouth and seal your lips tightly around it. Breathe in slowly and as deeply as possible, raising the piston or the ball toward the top of the column. Hold your breath for 3-5 seconds or for as long as possible. Allow the piston or ball to fall to the bottom of the column. Remove the mouthpiece from your mouth and breathe out normally. Rest for a few seconds and repeat Steps 1 through 7 at least 10 times every 1-2 hours when you are awake. Take your time and take a few normal breaths between deep breaths. The spirometer may include an indicator to show your best effort. Use the indicator as a goal to  work toward during each repetition. After each set of 10 deep breaths, practice coughing to be sure your lungs are clear. If you have an incision (the cut made at the time of surgery), support your incision when coughing by placing a pillow or rolled up towels firmly against it. Once you are able to get out of bed, walk around indoors and cough well. You may stop using the incentive spirometer when instructed by your caregiver.  RISKS AND COMPLICATIONS Take your time so you do not get dizzy or light-headed. If you are in pain, you  may need to take or ask for pain medication before doing incentive spirometry. It is harder to take a deep breath if you are having pain. AFTER USE Rest and breathe slowly and easily. It can be helpful to keep track of a log of your progress. Your caregiver can provide you with a simple table to help with this. If you are using the spirometer at home, follow these instructions: [place] IF:  You are having difficultly using the spirometer. You have trouble using the spirometer as often as instructed. Your pain medication is not giving enough relief while using the spirometer. You develop fever of 100.5 F (38.1 C) or higher. SEEK IMMEDIATE MEDICAL CARE IF:  You cough up bloody sputum that had not been present before. You develop fever of 102 F (38.9 C) or greater. You develop worsening pain at or near the incision site. MAKE SURE YOU:  Understand these instructions. Will watch your condition. Will get help right away if you are not doing well or get worse. Document Released: 10/07/2006 Document Revised: 08/19/2011 Document Reviewed: 12/08/2006 [facility] Patient Information 2014 [place], [place].   ________________________________________________________________________

## 2022-03-18 ENCOUNTER — Encounter (HOSPITAL_COMMUNITY)
Admission: RE | Admit: 2022-03-18 | Discharge: 2022-03-18 | Disposition: A | Discharge status: Home / Self Care | Payer: 59 | Source: Ambulatory Visit | Attending: Orthopedic Surgery | Admitting: Orthopedic Surgery

## 2022-03-18 ENCOUNTER — Encounter (HOSPITAL_COMMUNITY): Payer: Self-pay

## 2022-03-18 VITALS — BP 130/79 | HR 91 | Temp 98.1°F | Resp 14 | Ht 70.0 in | Wt 181.0 lb

## 2022-03-18 DIAGNOSIS — I10 Essential (primary) hypertension: Secondary | ICD-10-CM | POA: Diagnosis not present

## 2022-03-18 DIAGNOSIS — Z01818 Encounter for other preprocedural examination: Secondary | ICD-10-CM | POA: Diagnosis not present

## 2022-03-18 DIAGNOSIS — M1612 Unilateral primary osteoarthritis, left hip: Secondary | ICD-10-CM | POA: Diagnosis not present

## 2022-03-18 HISTORY — DX: Guillain-Barre syndrome: G61.0

## 2022-03-18 HISTORY — DX: Pneumonia, unspecified organism: J18.9

## 2022-03-18 HISTORY — DX: Essential (primary) hypertension: I10

## 2022-03-18 LAB — CBC
HCT: 42.6 % (ref 36.0–46.0)
Hemoglobin: 13.9 g/dL (ref 12.0–15.0)
MCH: 29.5 pg (ref 26.0–34.0)
MCHC: 32.6 g/dL (ref 30.0–36.0)
MCV: 90.4 fL (ref 80.0–100.0)
Platelets: 172 10*3/uL (ref 150–400)
RBC: 4.71 MIL/uL (ref 3.87–5.11)
RDW: 13.2 % (ref 11.5–15.5)
WBC: 5.9 10*3/uL (ref 4.0–10.5)
nRBC: 0 % (ref 0.0–0.2)

## 2022-03-18 LAB — SURGICAL PCR SCREEN
MRSA, PCR: NEGATIVE
Staphylococcus aureus: NEGATIVE

## 2022-03-18 LAB — BASIC METABOLIC PANEL
Anion gap: 8 (ref 5–15)
BUN: 13 mg/dL (ref 8–23)
CO2: 26 mmol/L (ref 22–32)
Calcium: 9 mg/dL (ref 8.9–10.3)
Chloride: 105 mmol/L (ref 98–111)
Creatinine, Ser: 0.63 mg/dL (ref 0.44–1.00)
GFR, Estimated: >60 mL/min (ref >60)
Glucose, Bld: 96 mg/dL (ref 70–99)
Potassium: 3.8 mmol/L (ref 3.5–5.1)
Sodium: 139 mmol/L (ref 135–145)

## 2022-03-28 ENCOUNTER — Other Ambulatory Visit: Payer: Self-pay

## 2022-03-28 ENCOUNTER — Ambulatory Visit (HOSPITAL_COMMUNITY): Payer: 59

## 2022-03-28 ENCOUNTER — Ambulatory Visit (HOSPITAL_COMMUNITY)
Admission: RE | Admit: 2022-03-28 | Discharge: 2022-03-28 | Disposition: A | Discharge status: Home / Self Care | Payer: 59 | Attending: Orthopedic Surgery | Admitting: Orthopedic Surgery

## 2022-03-28 ENCOUNTER — Encounter (HOSPITAL_COMMUNITY)
Admission: RE | Disposition: A | Discharge status: Home / Self Care | Payer: Self-pay | Source: Home / Self Care | Attending: Orthopedic Surgery

## 2022-03-28 ENCOUNTER — Ambulatory Visit (HOSPITAL_BASED_OUTPATIENT_CLINIC_OR_DEPARTMENT_OTHER): Payer: 59 | Admitting: Anesthesiology

## 2022-03-28 ENCOUNTER — Ambulatory Visit (HOSPITAL_COMMUNITY): Payer: 59 | Admitting: Physician Assistant

## 2022-03-28 ENCOUNTER — Encounter (HOSPITAL_COMMUNITY): Payer: Self-pay | Admitting: Orthopedic Surgery

## 2022-03-28 DIAGNOSIS — Z96641 Presence of right artificial hip joint: Secondary | ICD-10-CM | POA: Insufficient documentation

## 2022-03-28 DIAGNOSIS — I10 Essential (primary) hypertension: Secondary | ICD-10-CM | POA: Diagnosis not present

## 2022-03-28 DIAGNOSIS — K469 Unspecified abdominal hernia without obstruction or gangrene: Secondary | ICD-10-CM | POA: Diagnosis not present

## 2022-03-28 DIAGNOSIS — F419 Anxiety disorder, unspecified: Secondary | ICD-10-CM | POA: Insufficient documentation

## 2022-03-28 DIAGNOSIS — M1612 Unilateral primary osteoarthritis, left hip: Secondary | ICD-10-CM | POA: Diagnosis present

## 2022-03-28 DIAGNOSIS — Z96642 Presence of left artificial hip joint: Secondary | ICD-10-CM

## 2022-03-28 DIAGNOSIS — M797 Fibromyalgia: Secondary | ICD-10-CM | POA: Insufficient documentation

## 2022-03-28 DIAGNOSIS — K449 Diaphragmatic hernia without obstruction or gangrene: Secondary | ICD-10-CM | POA: Insufficient documentation

## 2022-03-28 DIAGNOSIS — M25752 Osteophyte, left hip: Secondary | ICD-10-CM | POA: Diagnosis not present

## 2022-03-28 DIAGNOSIS — K219 Gastro-esophageal reflux disease without esophagitis: Secondary | ICD-10-CM | POA: Diagnosis not present

## 2022-03-28 DIAGNOSIS — G43909 Migraine, unspecified, not intractable, without status migrainosus: Secondary | ICD-10-CM | POA: Diagnosis not present

## 2022-03-28 DIAGNOSIS — Z87891 Personal history of nicotine dependence: Secondary | ICD-10-CM | POA: Insufficient documentation

## 2022-03-28 HISTORY — PX: TOTAL HIP ARTHROPLASTY: SHX124

## 2022-03-28 LAB — TYPE AND SCREEN
ABO/RH(D): A POS
ABO/RH(D): A POS
Antibody Screen: NEGATIVE
Antibody Screen: NEGATIVE

## 2022-03-28 SURGERY — ARTHROPLASTY, HIP, TOTAL, ANTERIOR APPROACH
Anesthesia: Spinal | Site: Hip | Laterality: Left

## 2022-03-28 MED ORDER — METHOCARBAMOL 500 MG IVPB - SIMPLE MED
INTRAVENOUS | Status: AC
  Filled 2022-03-28: qty 55

## 2022-03-28 MED ORDER — LACTATED RINGERS IV SOLN: INTRAVENOUS | Status: DC

## 2022-03-28 MED ORDER — DEXAMETHASONE SODIUM PHOSPHATE 10 MG/ML IJ SOLN
INTRAMUSCULAR | Status: AC
  Filled 2022-03-28: qty 1

## 2022-03-28 MED ORDER — POVIDONE-IODINE 10 % EX SWAB: 2.0000 | Freq: Once | CUTANEOUS | Status: DC

## 2022-03-28 MED ORDER — FENTANYL CITRATE (PF) 250 MCG/5ML IJ SOLN
INTRAMUSCULAR | Status: DC | PRN
  Administered 2022-03-28: 50 ug via INTRAVENOUS

## 2022-03-28 MED ORDER — MIDAZOLAM HCL 2 MG/2ML IJ SOLN
INTRAMUSCULAR | Status: DC | PRN
  Administered 2022-03-28: 2 mg via INTRAVENOUS

## 2022-03-28 MED ORDER — OXYCODONE HCL 5 MG PO TABS: 5.0000 mg | ORAL_TABLET | ORAL | Status: DC | PRN

## 2022-03-28 MED ORDER — HYDROMORPHONE HCL 1 MG/ML IJ SOLN
0.2500 mg | INTRAMUSCULAR | Status: DC | PRN
  Administered 2022-03-28 (×2): 0.5 mg via INTRAVENOUS

## 2022-03-28 MED ORDER — ONDANSETRON HCL 4 MG/2ML IJ SOLN
INTRAMUSCULAR | Status: AC
  Filled 2022-03-28: qty 2

## 2022-03-28 MED ORDER — LACTATED RINGERS IV BOLUS: 250.0000 mL | Freq: Once | INTRAVENOUS | Status: DC

## 2022-03-28 MED ORDER — PROPOFOL 500 MG/50ML IV EMUL
INTRAVENOUS | Status: DC | PRN
  Administered 2022-03-28: 150 ug/kg/min via INTRAVENOUS

## 2022-03-28 MED ORDER — MIDAZOLAM HCL 2 MG/2ML IJ SOLN
INTRAMUSCULAR | Status: AC
  Filled 2022-03-28: qty 2

## 2022-03-28 MED ORDER — 0.9 % SODIUM CHLORIDE (POUR BTL) OPTIME
TOPICAL | Status: DC | PRN
  Administered 2022-03-28: 1000 mL

## 2022-03-28 MED ORDER — PROPOFOL 1000 MG/100ML IV EMUL
INTRAVENOUS | Status: AC
  Filled 2022-03-28: qty 100

## 2022-03-28 MED ORDER — OXYCODONE HCL 5 MG PO TABS
5.0000 mg | ORAL_TABLET | Freq: Once | ORAL | Status: AC | PRN
  Administered 2022-03-28: 5 mg via ORAL

## 2022-03-28 MED ORDER — PHENYLEPHRINE 80 MCG/ML (10ML) SYRINGE FOR IV PUSH (FOR BLOOD PRESSURE SUPPORT)
PREFILLED_SYRINGE | INTRAVENOUS | Status: DC | PRN
  Administered 2022-03-28 (×5): 160 ug via INTRAVENOUS

## 2022-03-28 MED ORDER — PHENYLEPHRINE 80 MCG/ML (10ML) SYRINGE FOR IV PUSH (FOR BLOOD PRESSURE SUPPORT)
PREFILLED_SYRINGE | INTRAVENOUS | Status: AC
  Filled 2022-03-28: qty 10

## 2022-03-28 MED ORDER — CHLORHEXIDINE GLUCONATE 0.12 % MT SOLN
15.0000 mL | Freq: Once | OROMUCOSAL | Status: AC
  Administered 2022-03-28: 15 mL via OROMUCOSAL

## 2022-03-28 MED ORDER — DEXAMETHASONE SODIUM PHOSPHATE 10 MG/ML IJ SOLN: 8.0000 mg | Freq: Once | INTRAMUSCULAR | Status: DC

## 2022-03-28 MED ORDER — PROPOFOL 10 MG/ML IV BOLUS
INTRAVENOUS | Status: DC | PRN
  Administered 2022-03-28 (×2): 30 mg via INTRAVENOUS

## 2022-03-28 MED ORDER — LACTATED RINGERS IV BOLUS
500.0000 mL | Freq: Once | INTRAVENOUS | Status: AC
  Administered 2022-03-28: 500 mL via INTRAVENOUS

## 2022-03-28 MED ORDER — METHOCARBAMOL 500 MG IVPB - SIMPLE MED
500.0000 mg | Freq: Four times a day (QID) | INTRAVENOUS | Status: DC | PRN
  Administered 2022-03-28: 500 mg via INTRAVENOUS

## 2022-03-28 MED ORDER — TRANEXAMIC ACID-NACL 1000-0.7 MG/100ML-% IV SOLN
1000.0000 mg | INTRAVENOUS | Status: AC
  Administered 2022-03-28: 1000 mg via INTRAVENOUS
  Filled 2022-03-28: qty 100

## 2022-03-28 MED ORDER — CEFAZOLIN SODIUM-DEXTROSE 2-4 GM/100ML-% IV SOLN: 2.0000 g | Freq: Four times a day (QID) | INTRAVENOUS | Status: DC

## 2022-03-28 MED ORDER — ONDANSETRON HCL 4 MG/2ML IJ SOLN
INTRAMUSCULAR | Status: DC | PRN
  Administered 2022-03-28: 4 mg via INTRAVENOUS

## 2022-03-28 MED ORDER — BUPIVACAINE IN DEXTROSE 0.75-8.25 % IT SOLN
INTRATHECAL | Status: DC | PRN
  Administered 2022-03-28: 2 mL via INTRATHECAL

## 2022-03-28 MED ORDER — PHENYLEPHRINE HCL-NACL 20-0.9 MG/250ML-% IV SOLN
INTRAVENOUS | Status: DC | PRN
  Administered 2022-03-28: 50 ug/min via INTRAVENOUS

## 2022-03-28 MED ORDER — CEFAZOLIN SODIUM-DEXTROSE 2-4 GM/100ML-% IV SOLN
2.0000 g | INTRAVENOUS | Status: AC
  Administered 2022-03-28: 2 g via INTRAVENOUS
  Filled 2022-03-28: qty 100

## 2022-03-28 MED ORDER — ONDANSETRON HCL 4 MG/2ML IJ SOLN
4.0000 mg | Freq: Once | INTRAMUSCULAR | Status: AC | PRN
  Administered 2022-03-28: 4 mg via INTRAVENOUS

## 2022-03-28 MED ORDER — OXYCODONE HCL 5 MG PO TABS
ORAL_TABLET | ORAL | Status: AC
  Filled 2022-03-28: qty 2

## 2022-03-28 MED ORDER — STERILE WATER FOR IRRIGATION IR SOLN
Status: DC | PRN
  Administered 2022-03-28: 2000 mL

## 2022-03-28 MED ORDER — FENTANYL CITRATE (PF) 250 MCG/5ML IJ SOLN
INTRAMUSCULAR | Status: AC
  Filled 2022-03-28: qty 5

## 2022-03-28 MED ORDER — METHOCARBAMOL 500 MG PO TABS: 500.0000 mg | ORAL_TABLET | Freq: Four times a day (QID) | ORAL | Status: DC | PRN

## 2022-03-28 MED ORDER — ORAL CARE MOUTH RINSE: 15.0000 mL | Freq: Once | OROMUCOSAL | Status: AC

## 2022-03-28 MED ORDER — DEXAMETHASONE SODIUM PHOSPHATE 10 MG/ML IJ SOLN
INTRAMUSCULAR | Status: DC | PRN
  Administered 2022-03-28: 10 mg via INTRAVENOUS

## 2022-03-28 MED ORDER — OXYCODONE HCL 5 MG/5ML PO SOLN: 5.0000 mg | Freq: Once | ORAL | Status: AC | PRN

## 2022-03-28 MED ORDER — TRANEXAMIC ACID-NACL 1000-0.7 MG/100ML-% IV SOLN
INTRAVENOUS | Status: AC
  Filled 2022-03-28: qty 100

## 2022-03-28 MED ORDER — TRANEXAMIC ACID-NACL 1000-0.7 MG/100ML-% IV SOLN
1000.0000 mg | Freq: Once | INTRAVENOUS | Status: AC
  Administered 2022-03-28: 1000 mg via INTRAVENOUS

## 2022-03-28 MED ORDER — OXYCODONE HCL 5 MG PO TABS
10.0000 mg | ORAL_TABLET | ORAL | Status: DC | PRN
  Administered 2022-03-28: 10 mg via ORAL

## 2022-03-28 MED ORDER — HYDROMORPHONE HCL 1 MG/ML IJ SOLN
INTRAMUSCULAR | Status: AC
  Filled 2022-03-28: qty 1

## 2022-03-28 MED ORDER — OXYCODONE HCL 5 MG PO TABS
ORAL_TABLET | ORAL | Status: AC
  Filled 2022-03-28: qty 1

## 2022-03-28 SURGICAL SUPPLY — 44 items
ADH SKN CLS APL DERMABOND .7 (GAUZE/BANDAGES/DRESSINGS) ×1
ADH SKN CLS LQ APL DERMABOND (GAUZE/BANDAGES/DRESSINGS) ×1
BAG COUNTER SPONGE SURGICOUNT (BAG) IMPLANT
BAG DECANTER FOR FLEXI CONT (MISCELLANEOUS) IMPLANT
BAG SPEC THK2 15X12 ZIP CLS (MISCELLANEOUS)
BAG SPNG CNTER NS LX DISP (BAG)
BAG ZIPLOCK 12X15 (MISCELLANEOUS) IMPLANT
BLADE SAG 18X100X1.27 (BLADE) ×1 IMPLANT
COVER PERINEAL POST (MISCELLANEOUS) ×1 IMPLANT
COVER SURGICAL LIGHT HANDLE (MISCELLANEOUS) ×1 IMPLANT
CUP ACETBLR 52 OD PINNACLE (Hips) IMPLANT
DERMABOND ADVANCED .7 DNX12 (GAUZE/BANDAGES/DRESSINGS) ×1 IMPLANT
DERMABOND ADVANCED .7 DNX6 (GAUZE/BANDAGES/DRESSINGS) IMPLANT
DRAPE FOOT SWITCH (DRAPES) ×1 IMPLANT
DRAPE STERI IOBAN 125X83 (DRAPES) ×1 IMPLANT
DRAPE U-SHAPE 47X51 STRL (DRAPES) ×2 IMPLANT
DRESSING AQUACEL AG SP 3.5X10 (GAUZE/BANDAGES/DRESSINGS) ×1 IMPLANT
DRSG AQUACEL AG ADV 3.5X10 (GAUZE/BANDAGES/DRESSINGS) IMPLANT
DRSG AQUACEL AG SP 3.5X10 (GAUZE/BANDAGES/DRESSINGS) ×1
DURAPREP 26ML APPLICATOR (WOUND CARE) ×1 IMPLANT
ELECT REM PT RETURN 15FT ADLT (MISCELLANEOUS) ×1 IMPLANT
FEM STEM 12/14 TAPER SZ 4 HIP (Orthopedic Implant) ×1 IMPLANT
FEMORAL STEM 12/14 TPR SZ4 HIP (Orthopedic Implant) IMPLANT
GLOVE BIO SURGEON STRL SZ 6 (GLOVE) ×1 IMPLANT
GLOVE BIOGEL PI IND STRL 6.5 (GLOVE) ×1 IMPLANT
GLOVE BIOGEL PI IND STRL 7.5 (GLOVE) ×1 IMPLANT
GLOVE ORTHO TXT STRL SZ7.5 (GLOVE) ×2 IMPLANT
GOWN STRL REUS W/ TWL LRG LVL3 (GOWN DISPOSABLE) ×3 IMPLANT
GOWN STRL REUS W/TWL LRG LVL3 (GOWN DISPOSABLE) ×3
HEAD CERAMIC DELTA 36 PLUS 1.5 (Hips) IMPLANT
HOLDER FOLEY CATH W/STRAP (MISCELLANEOUS) ×1 IMPLANT
KIT TURNOVER KIT A (KITS) IMPLANT
LINER NEUTRAL 52X36MM PLUS 4 (Liner) IMPLANT
PACK ANTERIOR HIP CUSTOM (KITS) ×1 IMPLANT
SCREW 6.5MMX30MM (Screw) IMPLANT
SUT MNCRL AB 4-0 PS2 18 (SUTURE) ×1 IMPLANT
SUT STRATAFIX 0 PDS 27 VIOLET (SUTURE) ×1
SUT VIC AB 1 CT1 36 (SUTURE) ×3 IMPLANT
SUT VIC AB 2-0 CT1 27 (SUTURE) ×2
SUT VIC AB 2-0 CT1 TAPERPNT 27 (SUTURE) ×2 IMPLANT
SUTURE STRATFX 0 PDS 27 VIOLET (SUTURE) ×1 IMPLANT
TRAY FOLEY W/BAG SLVR 14FR LF (SET/KITS/TRAYS/PACK) IMPLANT
TUBE SUCTION HIGH CAP CLEAR NV (SUCTIONS) ×1 IMPLANT
WATER STERILE IRR 1000ML POUR (IV SOLUTION) ×1 IMPLANT

## 2022-03-28 NOTE — Discharge Instructions (Signed)

## 2022-03-28 NOTE — Transfer of Care (Signed)
Immediate Anesthesia Transfer of Care Note  Patient: [name]  Procedure(s) Performed: TOTAL HIP ARTHROPLASTY ANTERIOR APPROACH (Left: Hip)  Patient Location: PACU  Anesthesia Type:MAC and Spinal  Level of Consciousness: awake, oriented and patient cooperative  Airway & Oxygen Therapy: Patient Spontanous Breathing and Patient connected to face mask oxygen  Post-op Assessment: Report given to RN and Post -op Vital signs reviewed and stable  Post vital signs: Reviewed  Last Vitals:  Vitals Value Taken Time  BP 122/80 03/28/22 1400  Temp 36.6 C 03/28/22 1322  Pulse 80 03/28/22 1411  Resp 10 03/28/22 1411  SpO2 97 % 03/28/22 1411  Vitals shown include unvalidated device data.  Last Pain:  Vitals:   03/28/22 0941  TempSrc:   PainSc: 2          Complications: No notable events documented.

## 2022-03-28 NOTE — Evaluation (Signed)
Physical Therapy Evaluation Patient Details Name: [name] MRN: [id number] DOB: 20-Apr-1961 Today's Date: 03/28/2022  History of Present Illness  61 yo S/P L THA , direct antrior, PMH: GBS, fibromyalgia,ACDF, RTKA  Clinical Impression  Patient mobilizing well, daughter present. Passed PT goals for DC today.  HEP provided.        Recommendations for follow up therapy are one component of a multi-disciplinary discharge planning process, led by the attending physician.  Recommendations may be updated based on patient status, additional functional criteria and insurance authorization.  Follow Up Recommendations Follow physician's recommendations for discharge plan and follow up therapies      Assistance Recommended at Discharge Set up Supervision/Assistance  Patient can return home with the following  Assistance with cooking/housework;Assist for transportation;Help with stairs or ramp for entrance    Equipment Recommendations None recommended by PT  Recommendations for Other Services       Functional Status Assessment Patient has had a recent decline in their functional status and demonstrates the ability to make significant improvements in function in a reasonable and predictable amount of time.     Precautions / Restrictions Precautions Precautions: Fall      Mobility  Bed Mobility Overal bed mobility: Modified Independent                  Transfers Overall transfer level: Needs assistance Equipment used: Rolling walker (2 wheels) Transfers: Sit to/from Stand Sit to Stand: Supervision                Ambulation/Gait Ambulation/Gait assistance: Min guard Gait Distance (Feet): 60 Feet Assistive device: Rolling walker (2 wheels) Gait Pattern/deviations: Step-to pattern, Step-through pattern          Stairs Stairs: Yes Stairs assistance: Min assist Stair Management: One rail Left, Forwards Number of Stairs: 2 General stair comments: dtr  assisted  Wheelchair Mobility    Modified Rankin (Stroke Patients Only)       Balance                                             Pertinent Vitals/Pain Pain Assessment Pain Assessment: Faces Pain Score: 4  Pain Location: left hip Pain Descriptors / Indicators: Burning Pain Intervention(s): Premedicated before session, Monitored during session, Ice applied    Home Living Family/patient expects to be discharged to:: Private residence Living Arrangements: Children Available Help at Discharge: Family;Available 24 hours/day Type of Home: House Home Access: Stairs to enter Entrance Stairs-Rails: Left Entrance Stairs-Number of Steps: 2   Home Layout: One level Home Equipment: Conservation officer, nature (2 wheels);Rollator (4 wheels);Grab bars - tub/shower Additional Comments: saying with daughter    Prior Function Prior Level of Function : Independent/Modified Independent                     Hand Dominance        Extremity/Trunk Assessment   Upper Extremity Assessment Upper Extremity Assessment: Overall WFL for tasks assessed    Lower Extremity Assessment Lower Extremity Assessment: LLE deficits/detail LLE Deficits / Details: able to advance    Cervical / Trunk Assessment Cervical / Trunk Assessment: Normal  Communication   Communication: No difficulties  Cognition Arousal/Alertness: Awake/alert Behavior During Therapy: WFL for tasks assessed/performed Overall Cognitive Status: Within Functional Limits for tasks assessed  General Comments      Exercises Total Joint Exercises Ankle Circles/Pumps: AROM, Both Quad Sets: AROM, Both Heel Slides: AROM, Left Hip ABduction/ADduction: AROM, Left   Assessment/Plan    PT Assessment Patient does not need any further PT services  PT Problem List         PT Treatment Interventions      PT Goals (Current goals can be found in the Care  Plan section)  Acute Rehab PT Goals Patient Stated Goal: home PT Goal Formulation: All assessment and education complete, DC therapy    Frequency       Co-evaluation               AM-PAC PT "6 Clicks" Mobility  Outcome Measure Help needed turning from your back to your side while in a flat bed without using bedrails?: None Help needed moving from lying on your back to sitting on the side of a flat bed without using bedrails?: None Help needed moving to and from a bed to a chair (including a wheelchair)?: A Little Help needed standing up from a chair using your arms (e.g., wheelchair or bedside chair)?: A Little Help needed to walk in hospital room?: A Little Help needed climbing 3-5 steps with a railing? : A Little 6 Click Score: 20    End of Session Equipment Utilized During Treatment: Gait belt Activity Tolerance: Patient tolerated treatment well Patient left: in bed;with family/visitor present;with call bell/phone within reach Nurse Communication: Mobility status PT Visit Diagnosis: Pain;Difficulty in walking, not elsewhere classified (R26.2) Pain - Right/Left: Left Pain - part of body: Hip    Time: 7048-8891 PT Time Calculation (min) (ACUTE ONLY): 23 min   Charges:   PT Evaluation $PT Eval Low Complexity: 1 Low PT Treatments $Gait Training: 8-22 mins        [place] Office [phone] Weekend [phone]   Claretha Cooper 03/28/2022, 6:14 PM

## 2022-03-28 NOTE — Anesthesia Postprocedure Evaluation (Signed)
Anesthesia Post Note  Patient: [name]  Procedure(s) Performed: TOTAL HIP ARTHROPLASTY ANTERIOR APPROACH (Left: Hip)     Patient location during evaluation: PACU Anesthesia Type: Spinal Level of consciousness: oriented and awake and alert Pain management: pain level controlled Vital Signs Assessment: post-procedure vital signs reviewed and stable Respiratory status: spontaneous breathing, respiratory function stable and nonlabored ventilation Cardiovascular status: blood pressure returned to baseline and stable Postop Assessment: no headache, no backache, no apparent nausea or vomiting, spinal receding and patient able to bend at knees Anesthetic complications: no   No notable events documented.  Last Vitals:  Vitals:   03/28/22 0920 03/28/22 1322  BP: 139/85 99/68  Pulse: 89 86  Resp: 15 14  Temp: 36.7 C 36.6 C  SpO2: 98% 100%    Last Pain:  Vitals:   03/28/22 1322  TempSrc:   PainSc: 0-No pain                 Nino Amano A.

## 2022-03-28 NOTE — Interval H&P Note (Signed)
History and Physical Interval Note:  03/28/2022 9:46 AM  [name]  has presented today for surgery, with the diagnosis of left hip osteoarthritis.  The various methods of treatment have been discussed with the patient and family. After consideration of risks, benefits and other options for treatment, the patient has consented to  Procedure(s): TOTAL HIP ARTHROPLASTY ANTERIOR APPROACH (Left) as a surgical intervention.  The patient's history has been reviewed, patient examined, no change in status, stable for surgery.  I have reviewed the patient's chart and labs.  Questions were answered to the patient's satisfaction.     Mauri Pole

## 2022-03-28 NOTE — Anesthesia Procedure Notes (Signed)
Procedure Name: MAC Date/Time: 03/28/2022 11:48 AM  Performed by: Jenne Campus, CRNAPre-anesthesia Checklist: Patient identified, Emergency Drugs available, Suction available and Patient being monitored Oxygen Delivery Method: Simple face mask

## 2022-03-28 NOTE — Anesthesia Preprocedure Evaluation (Addendum)
Anesthesia Evaluation  Patient identified by MRN, date of birth, ID band Patient awake    Reviewed: Allergy & Precautions, NPO status , Patient's Chart, lab work & pertinent test results  History of Anesthesia Complications (+) PONV, Family history of anesthesia reaction and history of anesthetic complications  Airway Mallampati: II  TM Distance: >3 FB Neck ROM: Full   Comment: TMJ with dislocation. Dental no notable dental hx. 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 Resolved spontaneously. Hospitalized for 3 mos.  Neuromuscular disease    GI/Hepatic Neg liver ROS, hiatal hernia, GERD  Medicated and Controlled,  Endo/Other  negative endocrine ROS  Renal/GU Renal diseaseHx/o renal calculi  negative genitourinary   Musculoskeletal  (+) Arthritis , Osteoarthritis,  Fibromyalgia -Left Hip OA   Abdominal   Peds  Hematology negative hematology ROS (+)   Anesthesia Other Findings TMJ with dislocation.  Reproductive/Obstetrics                            Anesthesia Physical Anesthesia Plan  ASA: 2  Anesthesia Plan: Spinal   Post-op Pain Management: Minimal or no pain anticipated and Regional block*   Induction: Intravenous  PONV Risk Score and Plan: 4 or greater and Treatment may vary due to age or medical condition, Ondansetron, Dexamethasone and Propofol infusion  Airway Management Planned: Natural Airway and Simple Face Mask  Additional Equipment: None  Intra-op Plan:   Post-operative Plan: Extubation in OR  Informed Consent: I have reviewed the patients History  and Physical, chart, labs and discussed the procedure including the risks, benefits and alternatives for the proposed anesthesia with the patient or authorized representative who has indicated his/her understanding and acceptance.     Dental advisory given  Plan Discussed with: CRNA and Anesthesiologist  Anesthesia Plan Comments:         Anesthesia Quick Evaluation

## 2022-03-28 NOTE — H&P (Signed)
TOTAL HIP ADMISSION H&P  Patient is admitted for left total hip arthroplasty.  Subjective:  Chief Complaint: left hip pain  HPI: [name], 61 y.o. female, has a history of pain and functional disability in the left hip(s) due to arthritis and patient has failed non-surgical conservative treatments for greater than 12 weeks to include NSAID's and/or analgesics and activity modification.  Onset of symptoms was gradual starting 2 years ago with gradually worsening course since that time.The patient noted no past surgery on the left hip(s).  Patient currently rates pain in the left hip at 8 out of 10 with activity. Patient has worsening of pain with activity and weight bearing, pain that interfers with activities of daily living, and pain with passive range of motion. Patient has evidence of joint space narrowing by imaging studies. This condition presents safety issues increasing the risk of falls. There is no current active infection.  Patient Active Problem List   Diagnosis Date Noted   Primary hypertension 02/19/2021   Pain management contract agreement 05/16/2016   Insomnia 04/13/2015   BMI 26.0-26.9,adult 04/13/2015   Chronic nausea 10/12/2014   GAD (generalized anxiety disorder) 05/25/2014   Hyperlipidemia 11/04/2012   Migraines 11/04/2012   GERD (gastroesophageal reflux disease) 11/04/2012   Fibromyalgia 11/04/2012   TMJ arthropathy 11/04/2012   Past Medical History:  Diagnosis Date   Arthritis    right hand   Dyslipidemia    Family history of anesthesia complication    patients mother has severe nausea and vomiting after   Fibromyalgia    GERD (gastroesophageal reflux disease)    Guillain-Barre (HCC)    Guillain-Barre syndrome (HCC)    Hiatal hernia    History of kidney stones    Hypertension    pre   Kidney stones    Migraines    one/month   Pneumonia    PONV (postoperative nausea and vomiting)    TMJ (dislocation of temporomandibular joint)    UTI (urinary  tract infection)    hx of   Vitamin D deficiency     Past Surgical History:  Procedure Laterality Date   ABDOMINAL HYSTERECTOMY     ADNOIDS     ANTERIOR CERVICAL DECOMP/DISCECTOMY FUSION N/A 10/27/2013   Procedure: ACDF/ANTERIOR CERVICAL DECOMPRESSION/DISCECTOMY FUSION  C5-C7  (2 LEVELS);  Surgeon: Melina Schools, MD;  Location: [place];  Service: Orthopedics;  Laterality: N/A;   BACK SURGERY     spinal   COLONOSCOPY  05/26/2020   EYE SURGERY Bilateral    cataract removal   KNEE ARTHROSCOPY Right    TOTAL KNEE ARTHROPLASTY Right 06/30/2020   Procedure: TOTAL KNEE ARTHROPLASTY;  Surgeon: Netta Cedars, MD;  Location: [facility];  Service: Orthopedics;  Laterality: Right;   UPPER GASTROINTESTINAL ENDOSCOPY  05/26/2020    No current facility-administered medications for this encounter.   Current Outpatient Medications  Medication Sig Dispense Refill Last Dose   cyclobenzaprine (FLEXERIL) 10 MG tablet TAKE ONE TABLET THREE TIMES DAILY (Patient taking differently: Take 10 mg by mouth at bedtime.) 90 tablet 1    diclofenac (VOLTAREN) 75 MG EC tablet Take 75 mg by mouth 2 (two) times daily.      furosemide (LASIX) 20 MG tablet Take 1 tablet (20 mg total) by mouth daily. 90 tablet 1    HYDROcodone-acetaminophen (NORCO) 10-325 MG tablet Take 1 tablet by mouth 3 (three) times daily. (Patient taking differently: Take 1 tablet by mouth 3 (three) times daily as needed for moderate pain.) 90 tablet 0  Milnacipran (SAVELLA) 50 MG TABS tablet Take 1 tablet (50 mg total) by mouth 2 (two) times daily. 180 tablet 1    pantoprazole (PROTONIX) 40 MG tablet Take 1 tablet (40 mg total) by mouth 2 (two) times daily. 180 tablet 1    pravastatin (PRAVACHOL) 40 MG tablet Take 1 tablet (40 mg total) by mouth daily. 90 tablet 1    SUMAtriptan (IMITREX) 100 MG tablet TAKE 1 TABLET AT ONSET OF HEADACHE, MAY REPEAT ONCE IN 2HOURS 9 tablet 2    EPINEPHrine 0.3 mg/0.3 mL IJ SOAJ injection Inject 0.3 mLs (0.3 mg total) into  the muscle once. (Patient not taking: Reported on 03/12/2022) 2 Device 1 Not Taking   [START ON 05/04/2022] HYDROcodone-acetaminophen (NORCO) 10-325 MG tablet Take 1 tablet by mouth 3 (three) times daily. (Patient not taking: Reported on 03/12/2022) 90 tablet 0 Not Taking   [START ON 04/04/2022] HYDROcodone-acetaminophen (NORCO) 10-325 MG tablet Take 1 tablet by mouth 3 (three) times daily. (Patient not taking: Reported on 03/12/2022) 90 tablet 0 Not Taking   Multiple Vitamin (MULTIVITAMIN) tablet Take 1 tablet by mouth daily.      ondansetron (ZOFRAN ODT) 4 MG disintegrating tablet Take 1 tablet (4 mg total) by mouth every 8 (eight) hours as needed for nausea or vomiting. 30 tablet 1    Allergies  Allergen Reactions   Other Anaphylaxis    Plastic bottles; patient drank from a water bottle and had to use Epi pen   Peanuts [Peanut Oil] Anaphylaxis   Statins Other (See Comments)    Severe joint pain   Tape Other (See Comments)    Causes red blisters on skin. Can use paper tape    Social History   Tobacco Use   Smoking status: Former    Packs/day: 0.50    Years: 30.00    Total pack years: 15.00    Types: Cigarettes    Quit date: 2010    Years since quitting: 13.8   Smokeless tobacco: Never  Substance Use Topics   Alcohol use: Not Currently    Comment: rare    Family History  Problem Relation Age of Onset   Heart disease Mother    Cancer Father        STOMACH CANCER   Stomach cancer Father    Cancer Maternal Grandfather    Diabetes Paternal Grandmother    Rectal cancer Neg Hx    Esophageal cancer Neg Hx    Colon cancer Neg Hx      Review of Systems  Constitutional:  Negative for chills and fever.  Respiratory:  Negative for cough and shortness of breath.   Cardiovascular:  Negative for chest pain.  Gastrointestinal:  Negative for nausea and vomiting.  Musculoskeletal:  Positive for arthralgias.     Objective:  Physical Exam Well nourished and well developed. General:  Alert and oriented x3, cooperative and pleasant, no acute distress. Head: normocephalic, atraumatic, neck supple. Eyes: EOMI.  Musculoskeletal: Left hip exam: Painful and limited hip flexion internal rotation over 10 degrees with external rotation of 30 degrees Mild tenderness around the left hip girdle Neurovascular intact distally   Calves soft and nontender. Motor function intact in LE. Strength 5/5 LE bilaterally. Neuro: Distal pulses 2+. Sensation to light touch intact in LE.  Vital signs in last 24 hours:    Labs:   Estimated body mass index is 25.97 kg/m as calculated from the following:   Height as of 03/18/22: '5\' 10"'$  (1.778 m).   Weight  as of 03/18/22: 82.1 kg.   Imaging Review Plain radiographs demonstrate severe degenerative joint disease of the left hip(s). The bone quality appears to be adequate for age and reported activity level.      Assessment/Plan:  End stage arthritis, left hip(s)  The patient history, physical examination, clinical judgement of the provider and imaging studies are consistent with end stage degenerative joint disease of the left hip(s) and total hip arthroplasty is deemed medically necessary. The treatment options including medical management, injection therapy, arthroscopy and arthroplasty were discussed at length. The risks and benefits of total hip arthroplasty were presented and reviewed. The risks due to aseptic loosening, infection, stiffness, dislocation/subluxation,  thromboembolic complications and other imponderables were discussed.  The patient acknowledged the explanation, agreed to proceed with the plan and consent was signed. Patient is being admitted for inpatient treatment for surgery, pain control, PT, OT, prophylactic antibiotics, VTE prophylaxis, progressive ambulation and ADL's and discharge planning.The patient is planning to be discharged  home.  Therapy Plans: HEP Disposition: Home with daughter (staying with  daughter) Planned DVT Prophylaxis: aspirin '81mg'$  BID DME needed: none PCP: Ronnald Collum, saw her last week - will get form signed TXA: IV Allergies: NKDA Anesthesia Concerns: none BMI: 26.1 Last HgbA1c: not diabetic   Other: - Hx of right THA in 2022 - Planning SDD - gabapentin - 100 mg TID - hx of fibromyalgia, this helped tremendously with last THA - Takes Norco 10 mg q8h at baseline - oxycodone, robaxin, tylenol, celebrex   Costella Hatcher, PA-C Orthopedic Surgery [facility] [place] [phone]

## 2022-03-28 NOTE — Op Note (Signed)
NAME:  [name]                ACCOUNT NO.: [id number]      MEDICAL RECORD NO.: [id number]      FACILITY:  [facility]      PHYSICIAN:  Mauri Pole  DATE OF BIRTH:  [birth year]     DATE OF PROCEDURE:  03/28/2022                                 OPERATIVE REPORT         PREOPERATIVE DIAGNOSIS: Left  hip osteoarthritis.      POSTOPERATIVE DIAGNOSIS:  Left hip osteoarthritis.      PROCEDURE:  Left total hip replacement through an anterior approach   utilizing DePuy THR system, component size 52 mm pinnacle cup, a size 36+4 neutral   Altrex liner, a size 4 Hi Actis stem with a 36+1.5 delta ceramic   ball.      SURGEON:  Pietro Cassis. Alvan Dame, M.D.      ASSISTANT:  Costella Hatcher, PA-C     ANESTHESIA:  Spinal.      SPECIMENS:  None.      COMPLICATIONS:  None.      BLOOD LOSS:  450 cc     DRAINS:  None.      INDICATION OF THE PROCEDURE:  [name] is a 61 y.o. female who had   presented to office for evaluation of left hip pain.  Radiographs revealed   progressive degenerative changes with bone-on-bone   articulation of the  hip joint, including subchondral cystic changes and osteophytes.  The patient had painful limited range of   motion significantly affecting their overall quality of life and function.  The patient was failing to    respond to conservative measures including medications and/or injections and activity modification and at this point was ready   to proceed with more definitive measures.  Consent was obtained for   benefit of pain relief.  Specific risks of infection, DVT, component   failure, dislocation, neurovascular injury, and need for revision surgery were reviewed in the office.     PROCEDURE IN DETAIL:  The patient was brought to operative theater.   Once adequate anesthesia, preoperative antibiotics, 2 gm of Ancef, 1 gm of Tranexamic Acid, and 10 mg of Decadron were administered, the patient was positioned supine on the [employer] table.  Once the patient was safely positioned with adequate padding of boney prominences we predraped out the hip, and used fluoroscopy to confirm orientation of the pelvis.      The left hip was then prepped and draped from proximal iliac crest to   mid thigh with a shower curtain technique.      Time-out was performed identifying the patient, planned procedure, and the appropriate extremity.     An incision was then made 2 cm lateral to the   anterior superior iliac spine extending over the orientation of the   tensor fascia lata muscle and sharp dissection was carried down to the   fascia of the muscle.      The fascia was then incised.  The muscle belly was identified and swept   laterally and retractor placed along the superior neck.  Following   cauterization of the circumflex vessels and removing some pericapsular   fat, a second cobra retractor was placed on the inferior  neck.  A T-capsulotomy was made along the line of the   superior neck to the trochanteric fossa, then extended proximally and   distally.  Tag sutures were placed and the retractors were then placed   intracapsular.  We then identified the trochanteric fossa and   orientation of my neck cut and then made a neck osteotomy with the femur on traction.  The femoral   head was removed without difficulty or complication.  Traction was let   off and retractors were placed posterior and anterior around the   acetabulum.      The labrum and foveal tissue were debrided.  I began reaming with a 45 mm   reamer and reamed up to 51 mm reamer with good bony bed preparation and a 52 mm  cup was chosen.  The final 52 mm Pinnacle cup was then impacted under fluoroscopy to confirm the depth of penetration and orientation with respect to   Abduction and forward flexion.  A screw was placed into the ilium followed by the hole eliminator.  The final   36+4 neutral Altrex liner was impacted with good visualized rim fit.  The cup  was positioned anatomically within the acetabular portion of the pelvis.      At this point, the femur was rolled to 100 degrees.  Further capsule was   released off the inferior aspect of the femoral neck.  I then   released the superior capsule proximally.  With the leg in a neutral position the hook was placed laterally   along the femur under the vastus lateralis origin and elevated manually and then held in position using the hook attachment on the bed.  The leg was then extended and adducted with the leg rolled to 100   degrees of external rotation.  Retractors were placed along the medial calcar and posteriorly over the greater trochanter.  Once the proximal femur was fully   exposed, I used a box osteotome to set orientation.  I then began   broaching with the starting chili pepper broach and passed this by hand and then broached up to 4.  With the 4 broach in place I chose a high offset neck and did several trial reductions.  The offset was appropriate, leg lengths   appeared to be equal best matched with the +1.5 head ball trial confirmed radiographically.   Given these findings, I went ahead and dislocated the hip, repositioned all   retractors and positioned the right hip in the extended and abducted position.  The final 4 Hi Actis stem was   chosen and it was impacted down to the level of neck cut.  Based on this   and the trial reductions, a final 36+1.5 delta ceramic ball was chosen and   impacted onto a clean and dry trunnion, and the hip was reduced.  The   hip had been irrigated throughout the case again at this point.  I did   reapproximate the superior capsular leaflet to the anterior leaflet   using #1 Vicryl.  The fascia of the   tensor fascia lata muscle was then reapproximated using #1 Vicryl and #0 Stratafix sutures.  The   remaining wound was closed with 2-0 Vicryl and running 4-0 Monocryl.   The hip was cleaned, dried, and dressed sterilely using Dermabond and    Aquacel dressing.  The patient was then brought   to recovery room in stable condition tolerating the procedure well.    Caryl Pina  Lu Duffel, PA-C was present for the entirety of the case involved from   preoperative positioning, perioperative retractor management, general   facilitation of the case, as well as primary wound closure as assistant.            Pietro Cassis Alvan Dame, M.D.        03/28/2022 9:47 AM

## 2022-03-28 NOTE — Anesthesia Procedure Notes (Signed)
Spinal  Patient location during procedure: OR Start time: 03/28/2022 11:43 AM End time: 03/28/2022 11:48 AM Reason for block: surgical anesthesia Staffing Performed: anesthesiologist  Anesthesiologist: Josephine Igo, MD Performed by: Josephine Igo, MD Authorized by: Josephine Igo, MD   Preanesthetic Checklist Completed: patient identified, IV checked, site marked, risks and benefits discussed, surgical consent, monitors and equipment checked, pre-op evaluation and timeout performed Spinal Block Patient position: sitting Prep: DuraPrep and site prepped and draped Patient monitoring: heart rate, cardiac monitor, continuous pulse ox and blood pressure Approach: midline Location: L3-4 Injection technique: single-shot Needle Needle type: Pencan  Needle gauge: 24 G Needle length: 9 cm Needle insertion depth: 6 cm Assessment Sensory level: T4 Events: CSF return Additional Notes Patient tolerated procedure well. Adequate sensory level.

## 2022-03-29 ENCOUNTER — Encounter (HOSPITAL_COMMUNITY): Payer: Self-pay | Admitting: Orthopedic Surgery

## 2022-06-04 ENCOUNTER — Ambulatory Visit: Payer: 59 | Admitting: Nurse Practitioner

## 2022-06-04 ENCOUNTER — Encounter: Payer: Self-pay | Admitting: Nurse Practitioner

## 2022-06-04 VITALS — BP 136/85 | HR 92 | Temp 98.2°F | Resp 20 | Ht 70.0 in | Wt 189.0 lb

## 2022-06-04 DIAGNOSIS — M797 Fibromyalgia: Secondary | ICD-10-CM | POA: Diagnosis not present

## 2022-06-04 DIAGNOSIS — E7800 Pure hypercholesterolemia, unspecified: Secondary | ICD-10-CM

## 2022-06-04 DIAGNOSIS — Z6826 Body mass index (BMI) 26.0-26.9, adult: Secondary | ICD-10-CM

## 2022-06-04 DIAGNOSIS — I10 Essential (primary) hypertension: Secondary | ICD-10-CM

## 2022-06-04 DIAGNOSIS — F411 Generalized anxiety disorder: Secondary | ICD-10-CM

## 2022-06-04 DIAGNOSIS — K219 Gastro-esophageal reflux disease without esophagitis: Secondary | ICD-10-CM | POA: Diagnosis not present

## 2022-06-04 DIAGNOSIS — M26653 Arthropathy of bilateral temporomandibular joint: Secondary | ICD-10-CM

## 2022-06-04 DIAGNOSIS — F5101 Primary insomnia: Secondary | ICD-10-CM

## 2022-06-04 MED ORDER — TIZANIDINE HCL 4 MG PO TABS: ORAL_TABLET | ORAL | 1 refills | Status: DC

## 2022-06-04 MED ORDER — KETOROLAC TROMETHAMINE 60 MG/2ML IM SOLN
60.0000 mg | Freq: Once | INTRAMUSCULAR | Status: AC
  Administered 2022-06-04: 60 mg via INTRAMUSCULAR

## 2022-06-04 MED ORDER — HYDROCODONE-ACETAMINOPHEN 10-325 MG PO TABS: 1.0000 | ORAL_TABLET | Freq: Three times a day (TID) | ORAL | 0 refills | Status: DC

## 2022-06-04 MED ORDER — HYDROCODONE-ACETAMINOPHEN 10-325 MG PO TABS: 1.0000 | ORAL_TABLET | Freq: Three times a day (TID) | ORAL | 0 refills | Status: AC

## 2022-06-04 NOTE — Progress Notes (Signed)
Subjective:    Patient ID: [name], female    DOB: 05/04/1961, 61 y.o.   MRN: [id number]    Chief Complaint: medical management of chronic issues     HPI:  [name] is a 61 y.o. who identifies as a female who was assigned female at birth.   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 Pure hypercholesterolemia Does not really watch diet. Does no dedicated exercise. Lab Results  Component Value Date   CHOL 194 06/16/2020   HDL 41 06/16/2020   LDLCALC 115 (H) 06/16/2020   TRIG 217 (H) 06/16/2020   CHOLHDL 4.7 (H) 06/16/2020     2. Primary hypertension No c/o chest pain, sob or headache. Does not check blood pressure at home. BP Readings from Last 3 Encounters:  03/28/22 (!) 149/73  03/18/22 130/79  03/05/22 (!) 146/77     3. Gastroesophageal reflux disease, unspecified whether esophagitis present Is on protonix daily and is doing well.  4. Fibromyalgia Pain assessment: Cause of pain- fibromyalgia Pain location- varies from day t day Pain on scale of 1-10- 6-7/10 Frequency- daily What increases pain-to much activity What makes pain Better-rest helps Effects on ADL - none Any change in general medical condition-none  Current opioids rx- norco 10/325 BID-TID # meds rx- 90 Effectiveness of current meds-helps Adverse reactions from pain meds-none Morphine equivalent- 30 MME  Pill count performed-No Last drug screen - 03/05/22 ( high risk q13m moderate risk [id number] risk yearly ) Urine drug screen today- No [place]  If yes were their any concerning findings? - no   Overdose risk: 1    08/12/2019    9:46 AM  Opioid Risk   Alcohol 0  Illegal Drugs 0  Rx Drugs 0  Alcohol 0  Illegal Drugs 0  Rx Drugs 0  Age between 16-45 years  0  History of Preadolescent Sexual Abuse 0   Psychological Disease 0  Depression 0  Opioid Risk Tool Scoring 0  Opioid Risk Interpretation Low Risk     Pain contract signed on: 12/06/21  5. GAD (generalized anxiety disorder) Has been doing well as of late    06/04/2022    8:13 AM 12/03/2021    8:40 AM 08/28/2021    9:08 AM 04/12/2021   10:28 AM  GAD 7 : Generalized Anxiety Score  Nervous, Anxious, on Edge 1 0 0 0  Control/stop worrying 1 0 0 0  Worry too much - different things 1 0 0 0  Trouble relaxing 3 0 0 0  Restless 1 0 0 0  Easily annoyed or irritable 1 0 0 0  Afraid - awful might happen 1 0 0 0  Total GAD 7 Score 9 0 0 0  Anxiety Difficulty Somewhat difficult Not difficult at all Not difficult at all Not difficult at all     6. Primary insomnia Has been sleeping well.  7. BMI 26.0-26.9,adult No recent weight changes Wt Readings from Last 3 Encounters:  03/28/22 180 lb 12.4 oz (82 kg)  03/18/22 181 lb (82.1 kg)  03/05/22 182 lb 6 oz (82.7 kg)   BMI Readings from Last 3 Encounters:  03/28/22 25.94 kg/m  03/18/22 25.97 kg/m  03/05/22 27.73 kg/m      New complaints: None today  Allergies  Allergen Reactions  Other Anaphylaxis    Plastic bottles; patient drank from a water bottle and had to use Epi pen   Peanuts [Peanut Oil] Anaphylaxis   Statins Other (See Comments)    Severe joint pain   Tape Other (See Comments)    Causes red blisters on skin. Can use paper tape   Outpatient Encounter Medications as of 06/04/2022  Medication Sig   cyclobenzaprine (FLEXERIL) 10 MG tablet TAKE ONE TABLET THREE TIMES DAILY (Patient taking differently: Take 10 mg by mouth at bedtime.)   EPINEPHrine 0.3 mg/0.3 mL IJ SOAJ injection Inject 0.3 mLs (0.3 mg total) into the muscle once. (Patient not taking: Reported on 03/12/2022)   furosemide (LASIX) 20 MG tablet Take 1 tablet (20 mg total) by mouth daily.   Milnacipran (SAVELLA) 50 MG TABS tablet Take 1 tablet (50 mg total) by mouth 2 (two) times daily.    Multiple Vitamin (MULTIVITAMIN) tablet Take 1 tablet by mouth daily.   ondansetron (ZOFRAN ODT) 4 MG disintegrating tablet Take 1 tablet (4 mg total) by mouth every 8 (eight) hours as needed for nausea or vomiting.   pantoprazole (PROTONIX) 40 MG tablet Take 1 tablet (40 mg total) by mouth 2 (two) times daily.   pravastatin (PRAVACHOL) 40 MG tablet Take 1 tablet (40 mg total) by mouth daily.   SUMAtriptan (IMITREX) 100 MG tablet TAKE 1 TABLET AT ONSET OF HEADACHE, MAY REPEAT ONCE IN 2HOURS   No facility-administered encounter medications on file as of 06/04/2022.    Past Surgical History:  Procedure Laterality Date   ABDOMINAL HYSTERECTOMY     ADNOIDS     ANTERIOR CERVICAL DECOMP/DISCECTOMY FUSION N/A 10/27/2013   Procedure: ACDF/ANTERIOR CERVICAL DECOMPRESSION/DISCECTOMY FUSION  C5-C7  (2 LEVELS);  Surgeon: Melina Schools, MD;  Location: [facility];  Service: Orthopedics;  Laterality: N/A;   BACK SURGERY     spinal   COLONOSCOPY  05/26/2020   EYE SURGERY Bilateral    cataract removal   KNEE ARTHROSCOPY Right    TOTAL HIP ARTHROPLASTY Left 03/28/2022   Procedure: TOTAL HIP ARTHROPLASTY ANTERIOR APPROACH;  Surgeon: Paralee Cancel, MD;  Location: [facility];  Service: Orthopedics;  Laterality: Left;   TOTAL KNEE ARTHROPLASTY Right 06/30/2020   Procedure: TOTAL KNEE ARTHROPLASTY;  Surgeon: Netta Cedars, MD;  Location: [facility];  Service: Orthopedics;  Laterality: Right;   UPPER GASTROINTESTINAL ENDOSCOPY  05/26/2020    Family History  Problem Relation Age of Onset   Heart disease Mother    Cancer Father        STOMACH CANCER   Stomach cancer Father    Cancer Maternal Grandfather    Diabetes Paternal Grandmother    Rectal cancer Neg Hx    Esophageal cancer Neg Hx    Colon cancer Neg Hx           Review of Systems  Constitutional:  Negative for diaphoresis.  Eyes:  Negative for pain.  Respiratory:  Negative for shortness of breath.   Cardiovascular:  Negative for chest pain,  palpitations and leg swelling.  Gastrointestinal:  Negative for abdominal pain.  Endocrine: Negative for polydipsia.  Skin:  Negative for rash.  Neurological:  Negative for dizziness, weakness and headaches.  Hematological:  Does not bruise/bleed easily.  All other systems reviewed and are negative.      Objective:   Physical Exam Vitals and nursing note reviewed.  Constitutional:      General: She is not in acute distress.    Appearance: Normal appearance. She is well-developed.  HENT:     Head: Normocephalic.     Right Ear: Tympanic membrane normal.     Left Ear: Tympanic membrane normal.     Nose: Nose normal.     Mouth/Throat:     Mouth: Mucous membranes are moist.  Eyes:     Pupils: Pupils are equal, round, and reactive to light.  Neck:     Vascular: No carotid bruit or JVD.  Cardiovascular:     Rate and Rhythm: Normal rate and regular rhythm.     Heart sounds: Normal heart sounds.  Pulmonary:     Effort: Pulmonary effort is normal. No respiratory distress.     Breath sounds: Normal breath sounds. No wheezing or rales.  Chest:     Chest wall: No tenderness.  Abdominal:     General: Bowel sounds are normal. There is no distension or abdominal bruit.     Palpations: Abdomen is soft. There is no hepatomegaly, splenomegaly, mass or pulsatile mass.     Tenderness: There is no abdominal tenderness.  Musculoskeletal:        General: Normal range of motion.     Cervical back: Normal range of motion and neck supple.  Lymphadenopathy:     Cervical: No cervical adenopathy.  Skin:    General: Skin is warm and dry.  Neurological:     Mental Status: She is alert and oriented to person, place, and time.     Deep Tendon Reflexes: Reflexes are normal and symmetric.  Psychiatric:        Behavior: Behavior normal.        Thought Content: Thought content normal.        Judgment: Judgment normal.     BP 136/85   Pulse 92   Temp 98.2 F (36.8 C) (Temporal)   Resp 20   Ht  _0  (1.778 m)   Wt 189 lb (85.7 kg)   SpO2 96%   BMI 27.12 kg/m        Assessment & Plan:   [name] comes in today with chief complaint of Medical Management of Chronic Issues   Diagnosis and orders addressed:  1. Pure hypercholesterolemia Low fat diet - Lipid panel  2. Primary hypertension Low sodium diet - CBC with Differential/Platelet - CMP14+EGFR  3. Gastroesophageal reflux disease, unspecified whether esophagitis present Avoid spicy foods Do not eat 2 hours prior to bedtime   4. Fibromyalgia Moist heat - HYDROcodone-acetaminophen (NORCO) 10-325 MG tablet; Take 1 tablet by mouth 3 (three) times daily.  Dispense: 90 tablet; Refill: 0 - HYDROcodone-acetaminophen (NORCO) 10-325 MG tablet; Take 1 tablet by mouth 3 (three) times daily.  Dispense: 90 tablet; Refill: 0 - HYDROcodone-acetaminophen (NORCO) 10-325 MG tablet; Take 1 tablet by mouth 3 (three) times daily.  Dispense: 90 tablet; Refill: 0 - Drug Screen 10 W/Conf, Se - tiZANidine (ZANAFLEX) 4 MG tablet; TAKE ONE TABLET AT BEDTIME AS NEEDED FOR MUSCLE SPASMS  Dispense: 90 tablet; Refill: 1 - ketorolac (TORADOL) injection 60 mg  5. GAD (generalized anxiety disorder) Stress management  6. Primary insomnia Bedtime routine  7. BMI 26.0-26.9,adult Discussed diet and exercise for person with BMI >25 Will recheck weight in 3-6 months   8. Arthropathy of both temporomandibular joints   Labs pending Health Maintenance reviewed Diet and exercise encouraged  Follow up plan: 3 month   [place], FNP

## 2022-06-04 NOTE — Patient Instructions (Signed)
Muscle Cramps and Spasms Muscle cramps and spasms occur when a muscle or muscles tighten and you have no control over this tightening (involuntary muscle contraction). They are a common problem and can develop in any muscle. The most common place is in the calf muscles of the leg. Muscle cramps and muscle spasms are both involuntary muscle contractions, but there are some differences between the two: Muscle cramps are painful. They come and go and may last for a few seconds or up to 15 minutes. Muscle cramps are often more forceful and last longer than muscle spasms. Muscle spasms may or may not be painful. They may also last just a few seconds or much longer. Certain medical conditions, such as diabetes or Parkinson's disease, can make it more likely to develop cramps or spasms. However, cramps or spasms are usually not caused by a serious underlying problem. Common causes include: Doing more physical work or exercise than your body is ready for (overexertion). Overuse from repeating certain movements too many times. Remaining in a certain position for a long period of time. Improper preparation, form, or technique while playing a sport or doing an activity. Dehydration. Injury. Side effects of some medicines. Abnormally low levels of the salts and minerals in your blood (electrolytes), especially potassium and calcium. This could happen if you are taking water pills (diuretics) or if you are pregnant. In many cases, the cause of muscle cramps or spasms is not known. Follow these instructions at home: Managing pain and stiffness     Try massaging, stretching, and relaxing the affected muscle. Do this for several minutes at a time. If directed, apply heat to tight or tense muscles as often as told by your health care provider. Use the heat source that your health care provider recommends, such as a moist heat pack or a heating pad. Place a towel between your skin and the heat source. Leave the  heat on for 20-30 minutes. Remove the heat if your skin turns bright red. This is especially important if you are unable to feel pain, heat, or cold. You may have a greater risk of getting burned. If directed, put ice on the affected area. This may help if you are sore or have pain after a cramp or spasm. Put ice in a plastic bag. Place a towel between your skin and the bag. Leave the ice on for 20 minutes, 2-3 times a day. Try taking hot showers or baths to help relax tight muscles. Eating and drinking Drink enough fluid to keep your urine pale yellow. Staying well hydrated may help prevent cramps or spasms. Eat a healthy diet that includes plenty of nutrients to help your muscles function. A healthy diet includes fruits and vegetables, lean protein, whole grains, and low-fat or nonfat dairy products. General instructions If you are having frequent cramps, avoid intense exercise for several days. Take over-the-counter and prescription medicines only as told by your health care provider. Pay attention to any changes in your symptoms. Keep all follow-up visits as told by your health care provider. This is important. Contact a health care provider if: Your cramps or spasms get more severe or happen more often. Your cramps or spasms do not improve over time. Summary Muscle cramps and spasms occur when a muscle or muscles tighten and you have no control over this tightening (involuntary muscle contraction). The most common place for cramps or spasms to occur is in the calf muscles of the leg. Massaging, stretching, and relaxing the affected   muscle may relieve the cramp or spasm. Drink enough fluid to keep your urine pale yellow. Staying well hydrated may help prevent cramps or spasms. This information is not intended to replace advice given to you by your health care provider. Make sure you discuss any questions you have with your health care provider. Document Revised: 12/15/2020 Document  Reviewed: 12/15/2020 Elsevier Patient Education  2023 Elsevier Inc.  

## 2022-06-12 LAB — LIPID PANEL
Chol/HDL Ratio: 4.1 ratio (ref 0.0–4.4)
Cholesterol, Total: 173 mg/dL (ref 100–199)
HDL: 42 mg/dL (ref >39)
LDL Chol Calc (NIH): 105 mg/dL — ABNORMAL HIGH (ref 0–99)
Triglycerides: 148 mg/dL (ref 0–149)
VLDL Cholesterol Cal: 26 mg/dL (ref 5–40)

## 2022-06-12 LAB — CBC WITH DIFFERENTIAL/PLATELET
Basophils Absolute: 0 10*3/uL (ref 0.0–0.2)
Basos: 0 %
EOS (ABSOLUTE): 0.1 10*3/uL (ref 0.0–0.4)
Eos: 2 %
Hematocrit: 43.9 % (ref 34.0–46.6)
Hemoglobin: 13.7 g/dL (ref 11.1–15.9)
Immature Grans (Abs): 0 10*3/uL (ref 0.0–0.1)
Immature Granulocytes: 0 %
Lymphocytes Absolute: 1.9 10*3/uL (ref 0.7–3.1)
Lymphs: 41 %
MCH: 26.6 pg (ref 26.6–33.0)
MCHC: 31.2 g/dL — ABNORMAL LOW (ref 31.5–35.7)
MCV: 85 fL (ref 79–97)
Monocytes Absolute: 0.4 10*3/uL (ref 0.1–0.9)
Monocytes: 8 %
Neutrophils Absolute: 2.3 10*3/uL (ref 1.4–7.0)
Neutrophils: 49 %
Platelets: 225 10*3/uL (ref 150–450)
RBC: 5.15 x10E6/uL (ref 3.77–5.28)
RDW: 12.2 % (ref 11.7–15.4)
WBC: 4.7 10*3/uL (ref 3.4–10.8)

## 2022-06-12 LAB — CMP14+EGFR
ALT: 53 IU/L — ABNORMAL HIGH (ref 0–32)
AST: 49 IU/L — ABNORMAL HIGH (ref 0–40)
Albumin/Globulin Ratio: 1.6 (ref 1.2–2.2)
Albumin: 4.3 g/dL (ref 3.9–4.9)
Alkaline Phosphatase: 139 IU/L — ABNORMAL HIGH (ref 44–121)
BUN/Creatinine Ratio: 24 (ref 12–28)
BUN: 16 mg/dL (ref 8–27)
Bilirubin Total: 0.2 mg/dL (ref 0.0–1.2)
CO2: 22 mmol/L (ref 20–29)
Calcium: 9.5 mg/dL (ref 8.7–10.3)
Chloride: 101 mmol/L (ref 96–106)
Creatinine, Ser: 0.68 mg/dL (ref 0.57–1.00)
Globulin, Total: 2.7 g/dL (ref 1.5–4.5)
Glucose: 138 mg/dL — ABNORMAL HIGH (ref 70–99)
Potassium: 4.4 mmol/L (ref 3.5–5.2)
Sodium: 138 mmol/L (ref 134–144)
Total Protein: 7 g/dL (ref 6.0–8.5)
eGFR: 98 mL/min/{1.73_m2} (ref >59)

## 2022-06-12 LAB — OPIATES,MS,WB/SP RFX
6-Acetylmorphine: NEGATIVE
Codeine: NEGATIVE ng/mL
Dihydrocodeine: 1.9 ng/mL
Hydrocodone: 21.3 ng/mL
Hydromorphone: NEGATIVE ng/mL
Morphine: NEGATIVE ng/mL
Opiate Confirmation: POSITIVE

## 2022-06-12 LAB — DRUG SCREEN 10 W/CONF, SERUM
Amphetamines, IA: NEGATIVE ng/mL
Barbiturates, IA: NEGATIVE ug/mL
Benzodiazepines, IA: NEGATIVE ng/mL
Cocaine & Metabolite, IA: NEGATIVE ng/mL
Methadone, IA: NEGATIVE ng/mL
Opiates, IA: POSITIVE ng/mL — AB
Oxycodones, IA: NEGATIVE ng/mL
Phencyclidine, IA: NEGATIVE ng/mL
Propoxyphene, IA: NEGATIVE ng/mL
THC(Marijuana) Metabolite, IA: NEGATIVE ng/mL

## 2022-06-12 LAB — OXYCODONES,MS,WB/SP RFX
Oxycocone: NEGATIVE ng/mL
Oxycodones Confirmation: NEGATIVE
Oxymorphone: NEGATIVE ng/mL

## 2022-06-21 ENCOUNTER — Other Ambulatory Visit: Payer: Self-pay | Admitting: Nurse Practitioner

## 2022-06-21 DIAGNOSIS — G43109 Migraine with aura, not intractable, without status migrainosus: Secondary | ICD-10-CM

## 2022-06-21 NOTE — Telephone Encounter (Signed)
Last office visit 06/04/22 Celebrex shows as historical med Sumatriptan last refill 08/28/21, #9, 2 refills

## 2022-08-03 ENCOUNTER — Other Ambulatory Visit: Payer: Self-pay | Admitting: Nurse Practitioner

## 2022-08-16 ENCOUNTER — Ambulatory Visit: Payer: 59 | Admitting: Nurse Practitioner

## 2022-08-16 ENCOUNTER — Encounter: Payer: Self-pay | Admitting: Nurse Practitioner

## 2022-08-16 VITALS — BP 135/78 | HR 72 | Temp 98.5°F | Resp 20 | Ht 70.0 in | Wt 190.0 lb

## 2022-08-16 DIAGNOSIS — R609 Edema, unspecified: Secondary | ICD-10-CM | POA: Diagnosis not present

## 2022-08-16 DIAGNOSIS — M797 Fibromyalgia: Secondary | ICD-10-CM | POA: Diagnosis not present

## 2022-08-16 MED ORDER — HYDROCODONE-ACETAMINOPHEN 10-325 MG PO TABS: 1.0000 | ORAL_TABLET | Freq: Three times a day (TID) | ORAL | 0 refills | Status: DC

## 2022-08-16 MED ORDER — TORSEMIDE 40 MG PO TABS: 1.0000 | ORAL_TABLET | Freq: Once | ORAL | 1 refills | Status: DC

## 2022-08-16 MED ORDER — PREDNISONE 20 MG PO TABS: 40.0000 mg | ORAL_TABLET | Freq: Every day | ORAL | 0 refills | Status: AC

## 2022-08-16 NOTE — Patient Instructions (Signed)
Edema  Edema is an abnormal buildup of fluids in the body tissues and under the skin. Swelling of the legs, feet, and ankles is a common symptom that becomes more likely as you get older. Swelling is also common in looser tissues, such as around the eyes. Pressing on the area may make a temporary dent in your skin (pitting edema). This fluid may also accumulate in your lungs (pulmonary edema). There are many possible causes of edema. Eating too much salt (sodium) and being on your feet or sitting for a long time can cause edema in your legs, feet, and ankles. Common causes of edema include: Certain medical conditions, such as heart failure, liver or kidney disease, and cancer. Weak leg blood vessels. An injury. Pregnancy. Medicines. Being obese. Low protein levels in the blood. Hot weather may make edema worse. Edema is usually painless. Your skin may look swollen or shiny. Follow these instructions at home: Medicines Take over-the-counter and prescription medicines only as told by your health care provider. Your health care provider may prescribe a medicine to help your body get rid of extra water (diuretic). Take this medicine if you are told to take it. Eating and drinking Eat a low-salt (low-sodium) diet to reduce fluid as told by your health care provider. Sometimes, eating less salt may reduce swelling. Depending on the cause of your swelling, you may need to limit how much fluid you drink (fluid restriction). General instructions Raise (elevate) the injured area above the level of your heart while you are sitting or lying down. Do not sit still or stand for long periods of time. Do not wear tight clothing. Do not wear garters on your upper legs. Exercise your legs to get your circulation going. This helps to move the fluid back into your blood vessels, and it may help the swelling go down. Wear compression stockings as told by your health care provider. These stockings help to prevent  blood clots and reduce swelling in your legs. It is important that these are the correct size. These stockings should be prescribed by your health care provider to prevent possible injuries. If elastic bandages or wraps are recommended, use them as told by your health care provider. Contact a health care provider if: Your edema does not get better with treatment. You have heart, liver, or kidney disease and have symptoms of edema. You have sudden and unexplained weight gain. Get help right away if: You develop shortness of breath or chest pain. You cannot breathe when you lie down. You develop pain, redness, or warmth in the swollen areas. You have heart, liver, or kidney disease and suddenly get edema. You have a fever and your symptoms suddenly get worse. These symptoms may be an emergency. Get help right away. Call 911. Do not wait to see if the symptoms will go away. Do not drive yourself to the hospital. Summary Edema is an abnormal buildup of fluids in the body tissues and under the skin. Eating too much salt (sodium)and being on your feet or sitting for a long time can cause edema in your legs, feet, and ankles. Raise (elevate) the injured area above the level of your heart while you are sitting or lying down. Follow your health care provider's instructions about diet and how much fluid you can drink. This information is not intended to replace advice given to you by your health care provider. Make sure you discuss any questions you have with your health care provider. Document Revised: 01/29/2021 Document   Reviewed: 01/29/2021 Elsevier Patient Education  2023 Elsevier Inc.  

## 2022-08-16 NOTE — Addendum Note (Signed)
Addended by: Chevis Pretty on: 08/16/2022 11:23 AM   Modules accepted: Orders

## 2022-08-16 NOTE — Progress Notes (Signed)
   Subjective:    Patient ID: [name], female    DOB: July 23, 1960, 62 y.o.   MRN: [id number]   Chief Complaint: Swelling in hands and feet (Going on for 2-3 weeks/)   HPI Patient come sin today c/o swelling of hands and feet. Started about 2-3 weeks ago. Seems to get worse at night. She is on lasix 20mg  daily. Denies pain.   Patient Active Problem List   Diagnosis Date Noted   S/P total left hip arthroplasty 03/28/2022   Primary hypertension 02/19/2021   Pain management contract agreement 05/16/2016   Insomnia 04/13/2015   BMI 26.0-26.9,adult 04/13/2015   Chronic nausea 10/12/2014   GAD (generalized anxiety disorder) 05/25/2014   Hyperlipidemia 11/04/2012   Migraines 11/04/2012   GERD (gastroesophageal reflux disease) 11/04/2012   Fibromyalgia 11/04/2012   TMJ arthropathy 11/04/2012       Review of Systems  Constitutional:  Negative for diaphoresis.  Eyes:  Negative for pain.  Respiratory:  Negative for shortness of breath.   Cardiovascular:  Positive for leg swelling (only feet). Negative for chest pain and palpitations.  Gastrointestinal:  Negative for abdominal pain.  Endocrine: Negative for polydipsia.  Skin:  Negative for rash.  Neurological:  Negative for dizziness, weakness and headaches.  Hematological:  Does not bruise/bleed easily.  All other systems reviewed and are negative.      Objective:   Physical Exam Constitutional:      Appearance: Normal appearance.  Cardiovascular:     Rate and Rhythm: Normal rate and regular rhythm.     Heart sounds: Normal heart sounds.  Pulmonary:     Effort: Pulmonary effort is normal.     Breath sounds: Normal breath sounds.  Musculoskeletal:     Comments: Bil hands and feet are edematous  Skin:    General: Skin is warm.  Neurological:     General: No focal deficit present.     Mental Status: She is alert and oriented to person, place, and time.  Psychiatric:        Mood and Affect: Mood normal.         Behavior: Behavior normal.    BP 135/78   Pulse 72   Temp 98.5 F (36.9 C) (Temporal)   Resp 20   Ht 5\' 10"  (1.778 m)   Wt 190 lb (86.2 kg)   SpO2 94%   BMI 27.26 kg/m         Assessment & Plan:   AIVA MISKELL in today with chief complaint of Swelling in hands and feet (Going on for 2-3 weeks/)   1. Peripheral edema Elevate legs when sitting Stop lasix  Added toresemide - Torsemide 40 MG TABS; Take 1 tablet by mouth once for 1 dose.  Dispense: 90 tablet; Refill: 1 - predniSONE (DELTASONE) 20 MG tablet; Take 2 tablets (40 mg total) by mouth daily with breakfast for 5 days. 2 po daily for 5 days  Dispense: 10 tablet; Refill: 0    The above assessment and management plan was discussed with the patient. The patient verbalized understanding of and has agreed to the management plan. Patient is aware to call the clinic if symptoms persist or worsen. Patient is aware when to return to the clinic for a follow-up visit. Patient educated on when it is appropriate to go to the emergency department.   Mary-Margaret Hassell Done, FNP

## 2022-09-12 ENCOUNTER — Ambulatory Visit: Payer: 59 | Admitting: Nurse Practitioner

## 2022-09-12 ENCOUNTER — Encounter: Payer: Self-pay | Admitting: Nurse Practitioner

## 2022-09-12 VITALS — BP 123/78 | HR 90 | Temp 96.9°F | Resp 20 | Ht 70.0 in | Wt 189.0 lb

## 2022-09-12 DIAGNOSIS — R079 Chest pain, unspecified: Secondary | ICD-10-CM

## 2022-09-12 DIAGNOSIS — M940 Chondrocostal junction syndrome [Tietze]: Secondary | ICD-10-CM | POA: Diagnosis not present

## 2022-09-12 DIAGNOSIS — R5383 Other fatigue: Secondary | ICD-10-CM

## 2022-09-12 MED ORDER — METHYLPREDNISOLONE ACETATE 80 MG/ML IJ SUSP
80.0000 mg | Freq: Once | INTRAMUSCULAR | Status: AC
Start: 2022-09-12 — End: 2022-09-12
  Administered 2022-09-12: 80 mg via INTRAMUSCULAR

## 2022-09-12 NOTE — Patient Instructions (Signed)
Costochondritis  Costochondritis is irritation and swelling (inflammation) of the tissue that connects the ribs to the breastbone (sternum). This tissue is called cartilage. This condition causes pain in the front of the chest. The pain often starts slowly. It may be in more than one rib. What are the causes? The cause of this condition is not always known. It can come from stress on the sternum. The cause of this stress could be: Chest injury. Exercise or activity. This may include lifting. Very bad coughing. What increases the risk? Being female. Being 30-40 years old. Starting a new exercise or work activity. Having low levels of vitamin D. Having a condition that makes you cough a lot. What are the signs or symptoms? Chest pain that: Starts slowly. It can be sharp or dull. Gets worse with deep breathing, coughing, or exercise. Gets better with rest. May be worse when you press on your ribs and breastbone. How is this treated? In most cases, this condition goes away on its own over time. You may need to take an NSAID, such as ibuprofen. This can help reduce pain. You may also need to: Rest and stay away from activities that make pain worse. Put heat or ice on the area that hurts. Do exercises to stretch your chest muscles. If these treatments do not help, your doctor may inject a medicine to numb the area. This can help relieve the pain. Follow these instructions at home: Managing pain, stiffness, and swelling     If told, put ice on the painful area. To do this: Put ice in a plastic bag. Place a towel between your skin and the bag. Leave the ice on for 20 minutes, 2-3 times a day. If told, put heat on the affected area. Do this as often as told by your doctor. Use the heat source that your doctor recommends, such as a moist heat pack or a heating pad. Place a towel between your skin and the heat source. Leave the heat on for 20-30 minutes. If your skin turns bright red,  take off the ice or heat right away to prevent skin damage. The risk of skin damage is higher if you cannot feel pain, heat, or cold. Activity Rest as told by your doctor. Do not do things that make your pain worse. This includes activities that use your chest, belly (abdomen), and side muscles. You may have to avoid lifting. Ask your doctor how much you can safely lift. Return to your normal activities when your doctor says that it is safe. General instructions Take over-the-counter and prescription medicines only as told by your doctor. Contact a doctor if: You have chills or a fever. Your pain does not go away or gets worse. You have a cough that does not go away. Get help right away if: You have a hard time breathing. You have very bad chest pain that does not get better with medicines, heat, or ice. These symptoms may be an emergency. Get help right away. Call 911. Do not wait to see if the symptoms will go away. Do not drive yourself to the hospital. This information is not intended to replace advice given to you by your health care provider. Make sure you discuss any questions you have with your health care provider. Document Revised: 12/13/2021 Document Reviewed: 12/13/2021 Elsevier Patient Education  2023 Elsevier Inc.  

## 2022-09-12 NOTE — Progress Notes (Signed)
   Subjective:    Patient ID: [name], female    DOB: [birth year], 62 y.o.   MRN: [id number]   Chief Complaint: Chest Pain and Nausea   Chest Pain  This is a new problem. The current episode started in the past 7 days. The onset quality is sudden. The problem occurs intermittently. The problem has been waxing and waning. The pain is at a severity of 7/10. The pain is moderate. The quality of the pain is described as heavy. The pain radiates to the left shoulder and right shoulder. Associated symptoms include nausea. Pertinent negatives include no back pain or cough. The pain is aggravated by nothing. She has tried nothing for the symptoms. The treatment provided mild relief.  Says she feels so weak that she can hardly get up and do anything.  Patient Active Problem List   Diagnosis Date Noted   S/P total left hip arthroplasty 03/28/2022   Primary hypertension 02/19/2021   Pain management contract agreement 05/16/2016   Insomnia 04/13/2015   BMI 26.0-26.9,adult 04/13/2015   Chronic nausea 10/12/2014   GAD (generalized anxiety disorder) 05/25/2014   Hyperlipidemia 11/04/2012   Migraines 11/04/2012   GERD (gastroesophageal reflux disease) 11/04/2012   Fibromyalgia 11/04/2012   TMJ arthropathy 11/04/2012       Review of Systems  Constitutional:  Positive for fatigue.  Respiratory:  Negative for cough.   Cardiovascular:  Positive for chest pain.  Gastrointestinal:  Positive for nausea.  Musculoskeletal:  Negative for back pain.       Objective:   Physical Exam Constitutional:      Appearance: Normal appearance. She is well-developed.  Cardiovascular:     Rate and Rhythm: Normal rate and regular rhythm.     Heart sounds: Normal heart sounds.     Comments: Pain on palpation of chest wall. Pulmonary:     Effort: Pulmonary effort is normal.     Breath sounds: Normal breath sounds.  Skin:    General: Skin is warm.  Neurological:     General: No focal deficit present.      Mental Status: She is alert and oriented to person, place, and time.  Psychiatric:        Mood and Affect: Mood normal.        Behavior: Behavior normal.    BP 123/78   Pulse 90   Temp (!) 96.9 F (36.1 C) (Temporal)   Resp 20   Ht 5\' 10"  (1.778 m)   Wt 189 lb (85.7 kg)   SpO2 98%   BMI 27.12 kg/m   EKG- Kerry Hough, FNP       Assessment & Plan:   [name] in today with chief complaint of Chest Pain and Nausea   1. Chest pain, unspecified type - EKG 12-Lead  2. Other fatigue - CBC with Differential/Platelet - CMP14+EGFR - Thyroid Panel With TSH - Vitamin B12  3. Costochondritis Moist heat Rest If not improving - RTO If worsen go the the emergency room. - methylPREDNISolone acetate (DEPO-MEDROL) injection 80 mg    The above assessment and management plan was discussed with the patient. The patient verbalized understanding of and has agreed to the management plan. Patient is aware to call the clinic if symptoms persist or worsen. Patient is aware when to return to the clinic for a follow-up visit. Patient educated on when it is appropriate to go to the emergency department.   Mary-Margaret Hassell Done, FNP

## 2022-09-13 ENCOUNTER — Other Ambulatory Visit: Payer: Self-pay | Admitting: Nurse Practitioner

## 2022-09-13 ENCOUNTER — Telehealth: Payer: Self-pay | Admitting: Nurse Practitioner

## 2022-09-13 DIAGNOSIS — R0782 Intercostal pain: Secondary | ICD-10-CM

## 2022-09-13 LAB — CBC WITH DIFFERENTIAL/PLATELET
Basophils Absolute: 0 10*3/uL (ref 0.0–0.2)
Basos: 0 %
EOS (ABSOLUTE): 0.1 10*3/uL (ref 0.0–0.4)
Eos: 2 %
Hematocrit: 43.7 % (ref 34.0–46.6)
Hemoglobin: 14.3 g/dL (ref 11.1–15.9)
Immature Grans (Abs): 0 10*3/uL (ref 0.0–0.1)
Immature Granulocytes: 0 %
Lymphocytes Absolute: 2 10*3/uL (ref 0.7–3.1)
Lymphs: 36 %
MCH: 28 pg (ref 26.6–33.0)
MCHC: 32.7 g/dL (ref 31.5–35.7)
MCV: 86 fL (ref 79–97)
Monocytes Absolute: 0.5 10*3/uL (ref 0.1–0.9)
Monocytes: 9 %
Neutrophils Absolute: 2.9 10*3/uL (ref 1.4–7.0)
Neutrophils: 53 %
Platelets: 206 10*3/uL (ref 150–450)
RBC: 5.1 x10E6/uL (ref 3.77–5.28)
RDW: 14.1 % (ref 11.7–15.4)
WBC: 5.6 10*3/uL (ref 3.4–10.8)

## 2022-09-13 LAB — CMP14+EGFR
ALT: 48 IU/L — ABNORMAL HIGH (ref 0–32)
AST: 44 IU/L — ABNORMAL HIGH (ref 0–40)
Albumin/Globulin Ratio: 1.5 (ref 1.2–2.2)
Albumin: 4.2 g/dL (ref 3.9–4.9)
Alkaline Phosphatase: 109 IU/L (ref 44–121)
BUN/Creatinine Ratio: 21 (ref 12–28)
BUN: 20 mg/dL (ref 8–27)
Bilirubin Total: 0.3 mg/dL (ref 0.0–1.2)
CO2: 28 mmol/L (ref 20–29)
Calcium: 10 mg/dL (ref 8.7–10.3)
Chloride: 94 mmol/L — ABNORMAL LOW (ref 96–106)
Creatinine, Ser: 0.95 mg/dL (ref 0.57–1.00)
Globulin, Total: 2.8 g/dL (ref 1.5–4.5)
Glucose: 183 mg/dL — ABNORMAL HIGH (ref 70–99)
Potassium: 3.3 mmol/L — ABNORMAL LOW (ref 3.5–5.2)
Sodium: 141 mmol/L (ref 134–144)
Total Protein: 7 g/dL (ref 6.0–8.5)
eGFR: 67 mL/min/{1.73_m2} (ref >59)

## 2022-09-13 LAB — THYROID PANEL WITH TSH
Free Thyroxine Index: 1.3 (ref 1.2–4.9)
T3 Uptake Ratio: 21 % — ABNORMAL LOW (ref 24–39)
T4, Total: 6 ug/dL (ref 4.5–12.0)
TSH: 2.84 u[IU]/mL (ref 0.450–4.500)

## 2022-09-13 LAB — VITAMIN B12: Vitamin B-12: 772 pg/mL (ref 232–1245)

## 2022-09-13 MED ORDER — POTASSIUM CHLORIDE CRYS ER 20 MEQ PO TBCR: 20.0000 meq | EXTENDED_RELEASE_TABLET | Freq: Every day | ORAL | 0 refills | Status: DC

## 2022-09-13 MED ORDER — BUDESONIDE-FORMOTEROL FUMARATE 80-4.5 MCG/ACT IN AERO: 2.0000 | INHALATION_SPRAY | Freq: Two times a day (BID) | RESPIRATORY_TRACT | 3 refills | Status: DC

## 2022-09-13 MED ORDER — FLUCONAZOLE 150 MG PO TABS: 150.0000 mg | ORAL_TABLET | Freq: Once | ORAL | 0 refills | Status: AC

## 2022-09-13 MED ORDER — AZITHROMYCIN 250 MG PO TABS: ORAL_TABLET | ORAL | 0 refills | Status: DC

## 2022-09-13 NOTE — Telephone Encounter (Signed)
Patient was told by MMM to call her today and let her know how she was feeling.  Pt says she is not feeling any better. Still having pressure in chest and hard to breath.

## 2022-09-13 NOTE — Addendum Note (Signed)
Addended by: Bennie Pierini on: 09/13/2022 07:58 AM   Modules accepted: Orders

## 2022-09-16 ENCOUNTER — Other Ambulatory Visit: Payer: Self-pay | Admitting: Nurse Practitioner

## 2022-09-16 ENCOUNTER — Other Ambulatory Visit: Payer: Self-pay

## 2022-09-16 ENCOUNTER — Telehealth: Payer: Self-pay

## 2022-09-16 DIAGNOSIS — R739 Hyperglycemia, unspecified: Secondary | ICD-10-CM

## 2022-09-16 LAB — SPECIMEN STATUS REPORT

## 2022-09-16 LAB — HGB A1C W/O EAG: Hgb A1c MFr Bld: 6.8 % — ABNORMAL HIGH (ref 4.8–5.6)

## 2022-09-16 MED ORDER — FLUTICASONE FUROATE-VILANTEROL 100-25 MCG/ACT IN AEPB: 1.0000 | INHALATION_SPRAY | Freq: Every day | RESPIRATORY_TRACT | 1 refills | Status: DC

## 2022-09-16 NOTE — Transitions of Care (Post Inpatient/ED Visit) (Signed)
   09/16/2022  Name: [name] MRN: [id number] DOB: 04-Feb-1961  Today's TOC FU Call Status: Today's TOC FU Call Status:: Unsuccessul Call (1st Attempt) Unsuccessful Call (1st Attempt) Date: 09/16/22  Attempted to reach the patient regarding the most recent Inpatient/ED visit.  Follow Up Plan: Additional outreach attempts will be made to reach the patient to complete the Transitions of Care (Post Inpatient/ED visit) call.   Signature L.Wilson,LPN

## 2022-09-16 NOTE — Telephone Encounter (Signed)
Will change inhaler to [facility]

## 2022-09-16 NOTE — Telephone Encounter (Signed)
Insurance will not cover Symbicort.  Covered alternatives are Fluticasone-Salmeterol, Wixela, Breo.    5 ?MUST USE FLUTICASONE-SALMETEROL ([id number], [id number]), Royetta Asal ELLIPTA [place] [id number] DRUG REQUIRES PRIOR AUTHORIZATION(PHARMACY HELP DESK [phone])[id number]: Submit Secondary Claim to [id number] PCN PW, Cash Discount Cards andother non-insurance plans are not validas primary

## 2022-09-18 ENCOUNTER — Other Ambulatory Visit: Payer: Self-pay | Admitting: Nurse Practitioner

## 2022-09-19 ENCOUNTER — Telehealth: Payer: Self-pay

## 2022-09-19 NOTE — Progress Notes (Signed)
  Care Management   Outreach Note  09/19/2022 Name: [name] MRN: [id number] DOB: Feb 04, 1961  An unsuccessful telephone outreach was attempted today to contact the patient about Chronic Care Management needs.    Follow Up Plan:  A HIPAA compliant phone message was left for the patient providing contact information and requesting a return call.  The care management team will reach out to the patient again over the next 7 days.  If patient returns call to provider office, please advise to call Embedded Care Management Care Guide Penne Lash * at [phone] Lash, RMA Care Guide [facility]  [place], [place] [address] Direct Dial: [phone] [email or web address]

## 2022-09-24 ENCOUNTER — Ambulatory Visit: Payer: 59 | Admitting: Nurse Practitioner

## 2022-09-24 ENCOUNTER — Encounter: Payer: Self-pay | Admitting: Nurse Practitioner

## 2022-09-24 VITALS — BP 127/76 | HR 91 | Temp 97.9°F | Resp 20 | Ht 70.0 in | Wt 185.0 lb

## 2022-09-24 DIAGNOSIS — I10 Essential (primary) hypertension: Secondary | ICD-10-CM | POA: Diagnosis not present

## 2022-09-24 DIAGNOSIS — K219 Gastro-esophageal reflux disease without esophagitis: Secondary | ICD-10-CM | POA: Diagnosis not present

## 2022-09-24 DIAGNOSIS — F411 Generalized anxiety disorder: Secondary | ICD-10-CM

## 2022-09-24 DIAGNOSIS — E118 Type 2 diabetes mellitus with unspecified complications: Secondary | ICD-10-CM

## 2022-09-24 DIAGNOSIS — E7800 Pure hypercholesterolemia, unspecified: Secondary | ICD-10-CM

## 2022-09-24 DIAGNOSIS — Z6826 Body mass index (BMI) 26.0-26.9, adult: Secondary | ICD-10-CM

## 2022-09-24 DIAGNOSIS — M797 Fibromyalgia: Secondary | ICD-10-CM

## 2022-09-24 DIAGNOSIS — F5101 Primary insomnia: Secondary | ICD-10-CM

## 2022-09-24 DIAGNOSIS — G43109 Migraine with aura, not intractable, without status migrainosus: Secondary | ICD-10-CM

## 2022-09-24 DIAGNOSIS — R600 Localized edema: Secondary | ICD-10-CM

## 2022-09-24 MED ORDER — HYDROCODONE-ACETAMINOPHEN 10-325 MG PO TABS
1.0000 | ORAL_TABLET | Freq: Three times a day (TID) | ORAL | 0 refills | Status: AC
Start: 2022-09-24 — End: 2022-10-24

## 2022-09-24 MED ORDER — SUMATRIPTAN SUCCINATE 100 MG PO TABS
ORAL_TABLET | ORAL | 2 refills | Status: DC
Start: 2022-09-24 — End: 2023-09-29

## 2022-09-24 MED ORDER — CELECOXIB 200 MG PO CAPS
200.0000 mg | ORAL_CAPSULE | Freq: Two times a day (BID) | ORAL | 5 refills | Status: DC
Start: 2022-09-24 — End: 2023-09-29

## 2022-09-24 MED ORDER — PANTOPRAZOLE SODIUM 40 MG PO TBEC: 40.0000 mg | DELAYED_RELEASE_TABLET | Freq: Two times a day (BID) | ORAL | 1 refills | Status: DC

## 2022-09-24 MED ORDER — TORSEMIDE 40 MG PO TABS
1.0000 | ORAL_TABLET | Freq: Once | ORAL | 1 refills | Status: DC
Start: 2022-09-24 — End: 2023-04-07

## 2022-09-24 MED ORDER — HYDROCODONE-ACETAMINOPHEN 10-325 MG PO TABS
1.0000 | ORAL_TABLET | Freq: Three times a day (TID) | ORAL | 0 refills | Status: AC
Start: 2022-10-24 — End: 2022-11-23

## 2022-09-24 MED ORDER — PRAVASTATIN SODIUM 40 MG PO TABS
40.0000 mg | ORAL_TABLET | Freq: Every day | ORAL | 1 refills | Status: DC
Start: 2022-09-24 — End: 2023-04-07

## 2022-09-24 MED ORDER — HYDROCODONE-ACETAMINOPHEN 10-325 MG PO TABS
1.0000 | ORAL_TABLET | Freq: Three times a day (TID) | ORAL | 0 refills | Status: AC
Start: 2022-11-24 — End: 2022-12-24

## 2022-09-24 MED ORDER — KETOROLAC TROMETHAMINE 60 MG/2ML IM SOLN
60.0000 mg | Freq: Once | INTRAMUSCULAR | Status: AC
Start: 2022-09-24 — End: 2022-09-24
  Administered 2022-09-24: 60 mg via INTRAMUSCULAR

## 2022-09-24 MED ORDER — SAVELLA 50 MG PO TABS
50.0000 mg | ORAL_TABLET | Freq: Two times a day (BID) | ORAL | 1 refills | Status: DC
Start: 2022-09-24 — End: 2023-04-07

## 2022-09-24 MED ORDER — TIZANIDINE HCL 4 MG PO TABS
ORAL_TABLET | ORAL | 1 refills | Status: DC
Start: 2022-09-24 — End: 2023-04-07

## 2022-09-24 NOTE — Progress Notes (Signed)
Subjective:    Patient ID: [name], female    DOB: 03-04-1961, 62 y.o.   MRN: [id number]   Chief Complaint: medical management of chronic issues     HPI:  [name] is a 62 y.o. who identifies as a female who was assigned female at birth.   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 Primary hypertension No c/o chest pain, sob or headache. Does not check blood pressure at home. BP Readings from Last 3 Encounters:  09/12/22 123/78  08/16/22 135/78  06/04/22 136/85     2. Pure hypercholesterolemia Does try to watch diet. No dedicated exercise. Lab Results  Component Value Date   CHOL 173 06/04/2022   HDL 42 06/04/2022   LDLCALC 105 (H) 06/04/2022   TRIG 148 06/04/2022   CHOLHDL 4.1 06/04/2022   The 10-year ASCVD risk score (Arnett DK, et al., 2019) is: 12.9%   3. diabetes Patient recently diagnoses with diabetes. She is not yet on any medication. She has an appointment with the clinical pharmacist to discuss diet Lab Results  Component Value Date   HGBA1C 6.8 (H) 09/12/2022    4. Gastroesophageal reflux disease, unspecified whether esophagitis present She is on protonix daily and that works well to keep symptoms  under control  5. Fibromyalgia Long history of muscle pain. Sheis on savella and tizanidine along with pain medication Pain assessment: Cause of pain- fibromyalgia Pain location- varies from day t day Pain on scale of 1-10- 5-6/10 Frequency- daily What increases pain-some days nothing, some days everything What makes pain Better-rest seems to help Effects on ADL - none Any change in general medical condition-none  Current opioids rx- norco 10/325 TID # meds rx- 90 Effectiveness of current meds-elps Adverse reactions from pain meds-none Morphine equivalent- 30 MME  Pill count performed-No Last drug screen - 03/05/22 ( high risk  q80m, moderate risk q25m, low risk yearly ) Urine drug screen today- No Was the [facility] reviewed- yes  If yes were their any concerning findings? - no   Overdose risk: 1    08/12/2019    9:46 AM  Opioid Risk   Alcohol 0  Illegal Drugs 0  Rx Drugs 0  Alcohol 0  Illegal Drugs 0  Rx Drugs 0  Age between 16-45 years  0  History of Preadolescent Sexual Abuse 0  Psychological Disease 0  Depression 0  Opioid Risk Tool Scoring 0  Opioid Risk Interpretation Low Risk     Pain contract signed on:   6. GAD (generalized anxiety disorder) Anxiety is usually work and or health related. Says she is doing well.  7. Primary insomnia Sleeps well most nights  8. BMI 26.0-26.9,adult no recent weight changes Wt Readings from Last 3 Encounters:  09/24/22 185 lb (83.9 kg)  09/12/22 189 lb (85.7 kg)  08/16/22 190 lb (86.2 kg)   BMI Readings from Last 3 Encounters:  09/24/22 26.54 kg/m  09/12/22 27.12 kg/m  08/16/22 27.26 kg/m      New complaints: She has had fatigue and just saw cardiology and is having and echo today and a stress test later on in the week.   Allergies  Allergen Reactions   Other Anaphylaxis    Plastic bottles; patient drank from a water bottle and had to use Epi pen   Peanuts [Peanut Oil] Anaphylaxis   Statins Other (See  Comments)    Severe joint pain   Tape Other (See Comments)    Causes red blisters on skin. Can use paper tape   Outpatient Encounter Medications as of 09/24/2022  Medication Sig   azithromycin (ZITHROMAX Z-PAK) 250 MG tablet As directed   budesonide-formoterol (SYMBICORT) 80-4.5 MCG/ACT inhaler Inhale 2 puffs into the lungs 2 (two) times daily.   celecoxib (CELEBREX) 200 MG capsule TAKE ONE CAPSULE TWICE DAILY   cyclobenzaprine (FLEXERIL) 10 MG tablet TAKE ONE TABLET THREE TIMES DAILY (Patient taking differently: Take 10 mg by mouth at bedtime.)   EPINEPHrine 0.3 mg/0.3 mL IJ SOAJ injection Inject 0.3 mLs (0.3 mg total) into the muscle once.    fluticasone furoate-vilanterol (BREO ELLIPTA) 100-25 MCG/ACT AEPB Inhale 1 puff into the lungs daily.   Milnacipran (SAVELLA) 50 MG TABS tablet Take 1 tablet (50 mg total) by mouth 2 (two) times daily.   Multiple Vitamin (MULTIVITAMIN) tablet Take 1 tablet by mouth daily.   ondansetron (ZOFRAN ODT) 4 MG disintegrating tablet Take 1 tablet (4 mg total) by mouth every 8 (eight) hours as needed for nausea or vomiting.   pantoprazole (PROTONIX) 40 MG tablet Take 1 tablet (40 mg total) by mouth 2 (two) times daily.   potassium chloride SA (KLOR-CON M) 20 MEQ tablet Take 1 tablet (20 mEq total) by mouth daily. For 5 days   pravastatin (PRAVACHOL) 40 MG tablet Take 1 tablet (40 mg total) by mouth daily.   SUMAtriptan (IMITREX) 100 MG tablet TAKE 1 TABLET AT ONSET OF HEADACHE;,MAY REPEAT ONCE IN 24 HOURS   tiZANidine (ZANAFLEX) 4 MG tablet TAKE ONE TABLET AT BEDTIME AS NEEDED FOR MUSCLE SPASMS   Torsemide 40 MG TABS Take 1 tablet by mouth once for 1 dose.   No facility-administered encounter medications on file as of 09/24/2022.    Past Surgical History:  Procedure Laterality Date   ABDOMINAL HYSTERECTOMY     ADNOIDS     ANTERIOR CERVICAL DECOMP/DISCECTOMY FUSION N/A 10/27/2013   Procedure: ACDF/ANTERIOR CERVICAL DECOMPRESSION/DISCECTOMY FUSION  C5-C7  (2 LEVELS);  Surgeon: Venita Lick, MD;  Location: [facility] OR;  Service: Orthopedics;  Laterality: N/A;   BACK SURGERY     spinal   COLONOSCOPY  05/26/2020   EYE SURGERY Bilateral    cataract removal   KNEE ARTHROSCOPY Right    TOTAL HIP ARTHROPLASTY Left 03/28/2022   Procedure: TOTAL HIP ARTHROPLASTY ANTERIOR APPROACH;  Surgeon: Durene Romans, MD;  Location: [facility] ORS;  Service: Orthopedics;  Laterality: Left;   TOTAL KNEE ARTHROPLASTY Right 06/30/2020   Procedure: TOTAL KNEE ARTHROPLASTY;  Surgeon: Beverely Low, MD;  Location: [facility] ORS;  Service: Orthopedics;  Laterality: Right;   UPPER GASTROINTESTINAL ENDOSCOPY  05/26/2020    Family History   Problem Relation Age of Onset   Heart disease Mother    Cancer Father        STOMACH CANCER   Stomach cancer Father    Cancer Maternal Grandfather    Diabetes Paternal Grandmother    Rectal cancer Neg Hx    Esophageal cancer Neg Hx    Colon cancer Neg Hx       Controlled substance contract: n/a     Review of Systems  Constitutional:  Negative for diaphoresis.  Eyes:  Negative for pain.  Respiratory:  Negative for shortness of breath.   Cardiovascular:  Negative for chest pain, palpitations and leg swelling.  Gastrointestinal:  Negative for abdominal pain.  Endocrine: Negative for polydipsia.  Skin:  Negative for rash.  Neurological:  Negative for dizziness, weakness and headaches.  Hematological:  Does not bruise/bleed easily.  All other systems reviewed and are negative.      Objective:   Physical Exam Vitals and nursing note reviewed.  Constitutional:      General: She is not in acute distress.    Appearance: Normal appearance. She is well-developed.  HENT:     Head: Normocephalic.     Right Ear: Tympanic membrane normal.     Left Ear: Tympanic membrane normal.     Nose: Nose normal.     Mouth/Throat:     Mouth: Mucous membranes are moist.  Eyes:     Pupils: Pupils are equal, round, and reactive to light.  Neck:     Vascular: No carotid bruit or JVD.  Cardiovascular:     Rate and Rhythm: Normal rate and regular rhythm.     Heart sounds: Normal heart sounds.     Comments: Chest pain on palpation Pulmonary:     Effort: Pulmonary effort is normal. No respiratory distress.     Breath sounds: Normal breath sounds. No wheezing or rales.  Chest:     Chest wall: No tenderness.  Abdominal:     General: Bowel sounds are normal. There is no distension or abdominal bruit.     Palpations: Abdomen is soft. There is no hepatomegaly, splenomegaly, mass or pulsatile mass.     Tenderness: There is no abdominal tenderness.  Musculoskeletal:        General: Normal  range of motion.     Cervical back: Normal range of motion and neck supple.  Lymphadenopathy:     Cervical: No cervical adenopathy.  Skin:    General: Skin is warm and dry.  Neurological:     Mental Status: She is alert and oriented to person, place, and time.     Deep Tendon Reflexes: Reflexes are normal and symmetric.  Psychiatric:        Behavior: Behavior normal.        Thought Content: Thought content normal.        Judgment: Judgment normal.     BP 127/76   Pulse 91   Temp 97.9 F (36.6 C) (Temporal)   Resp 20   Ht  (1.778 m)   Wt 185 lb (83.9 kg)   SpO2 93%   BMI 26.54 kg/m        Assessment & Plan:   [name] comes in today with chief complaint of Medical Management of Chronic Issues   Diagnosis and orders addressed:  1. Primary hypertension Low sodium diet  2. Pure hypercholesterolemia Low fat diet - pravastatin (PRAVACHOL) 40 MG tablet; Take 1 tablet (40 mg total) by mouth daily.  Dispense: 90 tablet; Refill: 1  3. Gastroesophageal reflux disease, unspecified whether esophagitis present Avoid spicy foods Do not eat 2 hours prior to bedtime - pantoprazole (PROTONIX) 40 MG tablet; Take 1 tablet (40 mg total) by mouth 2 (two) times daily.  Dispense: 180 tablet; Refill: 1  4. Fibromyalgia Moist eat - HYDROcodone-acetaminophen (NORCO) 10-325 MG tablet; Take 1 tablet by mouth 3 (three) times daily.  Dispense: 90 tablet; Refill: 0 - HYDROcodone-acetaminophen (NORCO) 10-325 MG tablet; Take 1 tablet by mouth 3 (three) times daily.  Dispense: 90 tablet; Refill: 0 - HYDROcodone-acetaminophen (NORCO) 10-325 MG tablet; Take 1 tablet by mouth 3 (three) times daily.  Dispense: 90 tablet; Refill: 0 - Milnacipran (SAVELLA) 50 MG TABS tablet; Take 1 tablet (50 mg total) by  mouth 2 (two) times daily.  Dispense: 180 tablet; Refill: 1 - tiZANidine (ZANAFLEX) 4 MG tablet; TAKE ONE TABLET AT BEDTIME AS NEEDED FOR MUSCLE SPASMS  Dispense: 90 tablet; Refill: 1 -  celecoxib (CELEBREX) 200 MG capsule; Take 1 capsule (200 mg total) by mouth 2 (two) times daily.  Dispense: 60 capsule; Refill: 5 - ketorolac (TORADOL) injection 60 mg  5. GAD (generalized anxiety disorder) Stress management  6. Primary insomnia Bedtime routine  7. BMI 26.0-26.9,adult Discussed diet and exercise for person with BMI >25 Will recheck weight in 3-6 months   8. Controlled type 2 diabetes mellitus with complication, without long-term current use of insulin Low carb diet  9. Migraine with aura and without status migrainosus, not intractable - SUMAtriptan (IMITREX) 100 MG tablet; TAKE 1 TABLET AT ONSET OF HEADACHE;,MAY REPEAT ONCE IN 24 HOURS  Dispense: 9 tablet; Refill: 2  10. Peripheral edema Elevate legs when sitting - Torsemide 40 MG TABS; Take 1 tablet by mouth once for 1 dose.  Dispense: 90 tablet; Refill: 1   Labs pending Health Maintenance reviewed Diet and exercise encouraged  Follow up plan: 3 months   Mary-Margaret Daphine Deutscher, FNP

## 2022-09-24 NOTE — Progress Notes (Signed)
  Care Coordination  Note  09/24/2022 Name: [name] MRN: [id number] DOB: 12-15-1960  [name] is a 62 y.o. year old female who is a primary care patient of Bennie Pierini, FNP. I reached out to Philis Fendt by phone today to offer care coordination services.      [name] was given information about Care Coordination services today including:  The Care Coordination services include support from the care team which includes your Nurse Coordinator, Clinical Social Worker, or Pharmacist.  The Care Coordination team is here to help remove barriers to the health concerns and goals most important to you. Care Coordination services are voluntary and the patient may decline or stop services at any time by request to their care team member.   Patient agreed to services and verbal consent obtained.   Follow up plan: Telephone appointment with care coordination team member scheduled for:10/11/2022  Penne Lash, RMA Care Guide [facility]  [place], [place] [address] Direct Dial: [phone] [email or web address]

## 2022-09-25 ENCOUNTER — Encounter: Payer: Self-pay | Admitting: Nurse Practitioner

## 2022-10-11 ENCOUNTER — Ambulatory Visit: Payer: 59 | Admitting: Pharmacist

## 2022-11-07 ENCOUNTER — Encounter: Payer: Self-pay | Admitting: Pharmacist

## 2022-11-07 NOTE — Progress Notes (Signed)
Error -- Patient was not seen  This encounter was entered in error Charges reversed  This encounter was created in error - please disregard.

## 2022-11-07 NOTE — Addendum Note (Signed)
Addended by: Vanice Sarah D on: 11/07/2022 03:43 PM   Modules accepted: Orders, Level of Service

## 2022-11-07 NOTE — Progress Notes (Deleted)
    10/11/2022 Name: [name] MRN: [id number] DOB: Feb 18, 1961   S:  62 yoF Presents for new onset type 2 diabetes evaluation, education, and management Patient was referred and last seen by Primary Care Provider on 09-24-22. Patient reports Diabetes was diagnosed in 09/24/22.  Insurance coverage/medication affordability: [employer]  Patient reports adherence with medications. Current diabetes medications include: n/a; new diagnosis Current hypertension medications include: n/a Goal 130/80 Current hyperlipidemia medications include: pravastatin  Patient denies hypoglycemic events.   Patient reported dietary habits: Eats 3  meals/day Discussed meal planning options and Plate method for healthy eating Avoid sugary drinks and desserts Incorporate balanced protein, non starchy veggies, 1 serving of carbohydrate with each meal Increase water intake Increase physical activity as able  Patient-reported exercise habits: encouraged as able   O:  Lab Results  Component Value Date   HGBA1C 6.8 (H) 09/12/2022    Lipid Panel     Component Value Date/Time   CHOL 173 06/04/2022 0844   CHOL 189 11/04/2012 1545   TRIG 148 06/04/2022 0844   TRIG CANCELED 12/27/2013 0845   TRIG 188 (H) 11/04/2012 1545   HDL 42 06/04/2022 0844   HDL CANCELED 12/27/2013 0845   HDL 40 11/04/2012 1545   CHOLHDL 4.1 06/04/2022 0844   LDLCALC 105 (H) 06/04/2022 0844   LDLCALC 89 10/06/2013 1121   LDLCALC 111 (H) 11/04/2012 1545     Home fasting blood sugars: n/a  2 hour post-meal/random blood sugars: n/a.    Clinical Atherosclerotic Cardiovascular Disease (ASCVD): No   The 10-year ASCVD risk score (Arnett DK, et al., 2019) is: 12.9%   Values used to calculate the score:     Age: 62 years     Sex: Female     Is Non-Hispanic African American: No     Diabetic: Yes     Tobacco smoker: No     Systolic Blood Pressure: 127 mmHg     Is BP treated: Yes     HDL Cholesterol: 42 mg/dL     Total  Cholesterol: 173 mg/dL    A/P:  New onset type 2 diabetes.   Reviewed dietary/lifestyle modifications with patient.  Educational materials mailed to patient as well.  No medications to be added for T2DM at this time.  Patient is currently on statin.    -If interested in testing blood sugar (BG goals mailed to patient FBG<130, PPBG<180)--relion meter at [employer] is the best deal  -Extensively discussed pathophysiology of diabetes, recommended lifestyle interventions, dietary effects on blood sugar control  -Counseled on s/sx of and management of hypoglycemia  -Next A1C anticipated 6 months.  Written patient instructions provided.  Total time in counseling 20 minutes.     Kieth Brightly, PharmD, BCACP Clinical Pharmacist, [facility]

## 2022-11-12 ENCOUNTER — Telehealth: Payer: Self-pay | Admitting: Pharmacist

## 2022-11-12 DIAGNOSIS — E118 Type 2 diabetes mellitus with unspecified complications: Secondary | ICD-10-CM

## 2022-11-12 NOTE — Telephone Encounter (Signed)
Patient would like to see nutrition/requesting referral for new onset T2DM Will route to PCP to place referral

## 2022-11-12 NOTE — Telephone Encounter (Signed)
Please place referral

## 2022-11-13 NOTE — Telephone Encounter (Signed)
Referral placed.

## 2022-11-19 ENCOUNTER — Other Ambulatory Visit: Payer: Self-pay | Admitting: Nurse Practitioner

## 2022-11-27 ENCOUNTER — Ambulatory Visit: Payer: 59 | Admitting: Nurse Practitioner

## 2022-11-27 ENCOUNTER — Encounter: Payer: Self-pay | Admitting: Nurse Practitioner

## 2022-11-27 ENCOUNTER — Other Ambulatory Visit (INDEPENDENT_AMBULATORY_CARE_PROVIDER_SITE_OTHER): Payer: 59

## 2022-11-27 VITALS — BP 122/80 | HR 86 | Ht 69.0 in | Wt 190.0 lb

## 2022-11-27 DIAGNOSIS — R748 Abnormal levels of other serum enzymes: Secondary | ICD-10-CM

## 2022-11-27 DIAGNOSIS — R070 Pain in throat: Secondary | ICD-10-CM | POA: Diagnosis not present

## 2022-11-27 DIAGNOSIS — R1013 Epigastric pain: Secondary | ICD-10-CM | POA: Diagnosis not present

## 2022-11-27 DIAGNOSIS — R079 Chest pain, unspecified: Secondary | ICD-10-CM

## 2022-11-27 LAB — LIPASE: Lipase: 27 U/L (ref 11.0–59.0)

## 2022-11-27 LAB — FERRITIN: Ferritin: 86.1 ng/mL (ref 10.0–291.0)

## 2022-11-27 MED ORDER — SUCRALFATE 1 G PO TABS: 1.0000 g | ORAL_TABLET | Freq: Three times a day (TID) | ORAL | 0 refills | Status: DC

## 2022-11-27 NOTE — Patient Instructions (Addendum)
_______________________________________________________  If your blood pressure at your visit was 140/90 or greater, please contact your primary care physician to follow up on this.  If you are age 64 or younger, your body mass index should be between 19-25. Your Body mass index is 28.06 kg/m. If this is out of the aformentioned range listed, please consider follow up with your Primary Care Provider.  ________________________________________________________  The [place] [facility] providers would like to encourage you to use [facility] to communicate with providers for non-urgent requests or questions.  Due to long hold times on the telephone, sending your provider a message by [facility] may be a faster and more efficient way to get a response.  Please allow 48 business hours for a response.  Please remember that this is for non-urgent requests.  _______________________________________________________  We have sent the following medications to your pharmacy for you to pick up at your convenience:  START: Carafate 1gm three times per day before meals and at bedtime for 14 days.  CONTINUE: pantoprazole 40mg  one tablet daily  CONTINUE: Zofran two times daily as needed for nausea.  Use Miralax 1 capful as needed constipation.  Your provider has requested that you go to the basement level for lab work before leaving today. Press "B" on the elevator. The lab is located at the first door on the left as you exit the elevator.  You have been scheduled for an abdominal ultrasound at [facility] (1st floor of hospital) on 12-03-22 at 8:00am. Please arrive 15 minutes prior to your appointment for registration. Make certain not to have anything to eat or drink after midnight the night prior to your appointment. Should you need to reschedule your appointment, please contact radiology at [phone]. This test typically takes about 30 minutes to perform.  Due to recent changes in healthcare laws, you may see  the results of your imaging and laboratory studies on MyChart before your provider has had a chance to review them.  We understand that in some cases there may be results that are confusing or concerning to you. Not all laboratory results come back in the same time frame and the provider may be waiting for multiple results in order to interpret others.  Please give [place] 48 hours in order for your provider to thoroughly review all the results before contacting the office for clarification of your results.   Thank you for entrusting me with your care and choosing [facility].  Gunnar Fusi, NP

## 2022-11-27 NOTE — Progress Notes (Signed)
.   Assessment and Plan   Primary GI: Claudette Head, MD ( Dec 2021 )   Brief Narrative:  62 y.o.  female whose past medical history includes,  but is not necessarily limited to, chronic GERD with mild erosive esophagitis , adenomatous colon polyps, fibromyallgia   Chronic GERD with hiatal hernia, recurrent nausea, chest pressure, epigastric and throat burning despite BID PPI.  Exacerbation of GERD in setting of recent weight gain?  ED visit in April for these symptoms was unremarkable.  Following that she had a negative cardiac workup.  -Continue BID Pantoprazole -Discussed anti-reflux measures   - Will refill Zofran 4 mg BID prn nausea.  -Trial of Carafate 1 gram ac and HS x 2 weeks -Monitor for constipation on above meds.  She can use a capful of MiraLAX every day as needed. -Serum lipase -Follow up with me in 2 month, call in interim if symptoms getting worse.   Elevated liver enzymes (chronic) < 2 x U.  Rule out hepatic steatosis  - RUQ [place]  - Obtain hepatic serologic workup  Altered bowel habits.  -Can use MiraLAX if needed. -Further evaluation at time of follow-up appointment.  She is up-to-date on colonoscopy  History of colon polyps. Two diminutive tubular adenomas removed in 2021.  A 7-year recall was recommended  Occasional shortness of breath with exertion and also at rest.  Etiology unclear.  Recent cardiac workup was negative  Recently diagnosed diabetes. Blood sugar in ED on 4/4 ( non-fasting) was 183. No meds started, to see Dietician soon.    History of Present Illness   Chief complaint: Nausea, chest pressure, burning in throat, burning in upper abdomen, bowel changes, occasional shortness of breath  [name] has previously been evaluated here for abdominal pain and GERD .  She was last seen in 2021 at which time she underwent a colonoscopy for rectal bleeding and an EGD for nausea .we started her on twice daily pantoprazole and the nausea resolved.    [name] has  done well until just recently .  Now with several weeks of upper GI symptoms including recurrent nausea without vomiting, burning and fullness in throat, epigastric burning, and chest discomfort .  The nausea is worse in the morning, usually eating a bland diet helps .  Zofran also helps the nausea and she is requesting a refill . She is still taking twice daily pantoprazole.  Her chest feels like there is an elephant sitting on it and sometimes she gets short of breath for no reason but then sometimes she is short of breath just walking around. She has recently gained weight in her abdomen which could be exacerbating reflux symptoms.  She cannot correlate symptoms with any medication changes.  She is not taking any NSAIDs. She limits caffeine and sleeps on a wedge pillow.  She takes narcotics on a chronic basis but has recently reduced the dose to only 1-2 doses a day  For evaluation of above symptoms Sobia went to [place] on 09/13/2022 .  Labs:  ALT 47, AST 40 WBC  6.1, hgb 14.1  Troponin normal Hgb 6.7 Free t4 slightly low at 0.8 TSH 2.71 CTA chest  - 7.3 subpleural RUL pulmonary nodule  She has had a recent negative cardiac workup by Dr. Denna Haggard has been diagnosed with diabetes.  Medication has not been initiated . She has appt with a dietician in August.   Having irregular bowel movement with alternating diarrhea / constipation.  Previous GI Endoscopies / Labs / Imaging   Double procedure Dec 2021 Colonoscopy for rectal bleeding  - Two diminutive polyps in the transverse colon and in the cecum, removed with a cold snare. Resected and retrieved. - Severe diverticulosis in the sigmoid colon and in the descending colon. There was narrowing of the colon in association with the diverticular opening. - Internal hemorrhoids. - The examination was otherwise normal on direct and retroflexion views. - Personal history of colonic polyp adenoma 2010 Bleeding could have been from  diverticulosis vs hemorrhoids or she could have had an episode of non-occlusive ischemc colitis  EGD for nausea  - Normal esophagus. - 3 cm hiatal hernia. - Gastritis. Biopsied. - Normal examined duodenum. - Narcotics may be a source of increased nausea  Diagnosis 1. Surgical [P], gastric antrum, body, gastritis - ANTRAL MUCOSA WITH REACTIVE GASTROPATHY. - OXYNTIC MUCOSA WITH HYPEREMIA. - WARTHIN-STARRY NEGATIVE FOR HELICOBACTER PYLORI, - NO DYSPLASIA OR CARCINOMA. 2. Surgical [P], colon, cecum, transverse, polyps (2) - TUBULAR ADENOMA (THREE FRAGMENTS). - NO HIGH GRADE DYSPLASIA OR CARCINOMA.  NM stress test CONCLUSIONS:  #  Normal myocardial perfusion imaging  #   Normal LV systolic function  #  This is a low risk study      Latest Ref Rng & Units 09/12/2022    4:08 PM 06/04/2022    8:44 AM 10/13/2020    2:22 PM  Hepatic Function  Total Protein 6.0 - 8.5 g/dL 7.0  7.0  6.7   Albumin 3.9 - 4.9 g/dL 4.2  4.3  4.2   AST 0 - 40 IU/L 44  49  32   ALT 0 - 32 IU/L 48  53  35   Alk Phosphatase 44 - 121 IU/L 109  139  84   Total Bilirubin 0.0 - 1.2 mg/dL 0.3  0.2  0.2        Latest Ref Rng & Units 09/12/2022    4:08 PM 06/04/2022    8:44 AM 03/18/2022    9:55 AM  CBC  WBC 3.4 - 10.8 x10E3/uL 5.6  4.7  5.9   Hemoglobin 11.1 - 15.9 g/dL 62.9  52.8  41.3   Hematocrit 34.0 - 46.6 % 43.7  43.9  42.6   Platelets 150 - 450 x10E3/uL 206  225  172       Past Medical History:  Diagnosis Date   Arthritis    right hand   Dyslipidemia    Family history of anesthesia complication    patients mother has severe nausea and vomiting after   Fibromyalgia    GERD (gastroesophageal reflux disease)    Guillain-Barre (HCC)    Guillain-Barre syndrome (HCC)    Hiatal hernia    History of kidney stones    Hypertension    pre   Kidney stones    Migraines    one/month   Pneumonia    PONV (postoperative nausea and vomiting)    TMJ (dislocation of temporomandibular joint)    UTI (urinary  tract infection)    hx of   Vitamin D deficiency     Past Surgical History:  Procedure Laterality Date   ABDOMINAL HYSTERECTOMY     ADNOIDS     ANTERIOR CERVICAL DECOMP/DISCECTOMY FUSION N/A 10/27/2013   Procedure: ACDF/ANTERIOR CERVICAL DECOMPRESSION/DISCECTOMY FUSION  C5-C7  (2 LEVELS);  Surgeon: Venita Lick, MD;  Location: [facility] OR;  Service: Orthopedics;  Laterality: N/A;   BACK SURGERY     spinal   COLONOSCOPY  05/26/2020  EYE SURGERY Bilateral    cataract removal   KNEE ARTHROSCOPY Right    TOTAL HIP ARTHROPLASTY Left 03/28/2022   Procedure: TOTAL HIP ARTHROPLASTY ANTERIOR APPROACH;  Surgeon: Durene Romans, MD;  Location: [facility] ORS;  Service: Orthopedics;  Laterality: Left;   TOTAL KNEE ARTHROPLASTY Right 06/30/2020   Procedure: TOTAL KNEE ARTHROPLASTY;  Surgeon: Beverely Low, MD;  Location: [facility] OR[facility];  Service: Orthopedics;  Laterality: Right;   UPPER GASTROINTESTINAL ENDOSCOPY  05/26/2020    Current Medications, Allergies, Family History and Social History were reviewed in [employer] record.     Current Outpatient Medications  Medication Sig Dispense Refill   budesonide-formoterol (SYMBICORT) 80-4.5 MCG/ACT inhaler Inhale 2 puffs into the lungs 2 (two) times daily. 1 each 3   celecoxib (CELEBREX) 200 MG capsule Take 1 capsule (200 mg total) by mouth 2 (two) times daily. 60 capsule 5   cyclobenzaprine (FLEXERIL) 10 MG tablet TAKE ONE TABLET THREE TIMES DAILY (Patient taking differently: Take 10 mg by mouth at bedtime.) 90 tablet 1   EPINEPHrine 0.3 mg/0.3 mL IJ SOAJ injection Inject 0.3 mLs (0.3 mg total) into the muscle once. 2 Device 1   fluticasone furoate-vilanterol (BREO ELLIPTA) 100-25 MCG/ACT AEPB INHALE ONE PUFF DAILY AS DIRECTED 60 each 0   HYDROcodone-acetaminophen (NORCO) 10-325 MG tablet Take 1 tablet by mouth 3 (three) times daily. 90 tablet 0   Milnacipran (SAVELLA) 50 MG TABS tablet Take 1 tablet (50 mg total) by mouth 2 (two) times daily.  180 tablet 1   Multiple Vitamin (MULTIVITAMIN) tablet Take 1 tablet by mouth daily.     ondansetron (ZOFRAN ODT) 4 MG disintegrating tablet Take 1 tablet (4 mg total) by mouth every 8 (eight) hours as needed for nausea or vomiting. 30 tablet 1   pantoprazole (PROTONIX) 40 MG tablet Take 1 tablet (40 mg total) by mouth 2 (two) times daily. 180 tablet 1   potassium chloride SA (KLOR-CON M) 20 MEQ tablet Take 1 tablet (20 mEq total) by mouth daily. For 5 days 30 tablet 0   pravastatin (PRAVACHOL) 40 MG tablet Take 1 tablet (40 mg total) by mouth daily. 90 tablet 1   SUMAtriptan (IMITREX) 100 MG tablet TAKE 1 TABLET AT ONSET OF HEADACHE;,MAY REPEAT ONCE IN 24 HOURS 9 tablet 2   tiZANidine (ZANAFLEX) 4 MG tablet TAKE ONE TABLET AT BEDTIME AS NEEDED FOR MUSCLE SPASMS 90 tablet 1   Torsemide 40 MG TABS Take 1 tablet by mouth once for 1 dose. 90 tablet 1   No current facility-administered medications for this visit.    Review of Systems: No chest pain. No shortness of breath. No urinary complaints.    Physical Exam  Wt Readings from Last 3 Encounters:  09/24/22 185 lb (83.9 kg)  09/12/22 189 lb (85.7 kg)  08/16/22 190 lb (86.2 kg)    BP 122/80   Pulse 86   Ht 5\' 9"  (1.753 m)   Wt 190 lb (86.2 kg)   BMI 28.06 kg/m  Constitutional:  Pleasant, generally well appearing female in no acute distress. Psychiatric: Normal mood and affect. Behavior is normal. EENT: Pupils normal.  Conjunctivae are normal. No scleral icterus. Neck supple.  Cardiovascular: Normal rate, regular rhythm.  Pulmonary/chest: Effort normal and breath sounds normal. No wheezing, rales or rhonchi. Abdominal: Soft, nondistended, nontender. Bowel sounds active throughout. There are no masses palpable. No hepatomegaly. Neurological: Alert and oriented to person place and time.  Skin: Skin is warm and dry. No rashes noted.  Willette Cluster, NP  11/27/2022, 8:06 AM  Cc:  Daphine Deutscher, Mary-Margaret, *

## 2022-11-28 LAB — IRON AND TIBC
Iron Saturation: 17 % (ref 15–55)
Iron: 70 ug/dL (ref 27–139)
Total Iron Binding Capacity: 404 ug/dL (ref 250–450)
UIBC: 334 ug/dL (ref 118–369)

## 2022-12-03 ENCOUNTER — Ambulatory Visit (HOSPITAL_COMMUNITY)
Admission: RE | Admit: 2022-12-03 | Discharge: 2022-12-03 | Disposition: A | Discharge status: Home / Self Care | Payer: 59 | Source: Ambulatory Visit | Attending: Nurse Practitioner | Admitting: Nurse Practitioner

## 2022-12-03 DIAGNOSIS — R748 Abnormal levels of other serum enzymes: Secondary | ICD-10-CM | POA: Insufficient documentation

## 2022-12-04 LAB — HEPATITIS C ANTIBODY: Hepatitis C Ab: NONREACTIVE

## 2022-12-04 LAB — HEPATITIS B SURFACE ANTIGEN: Hepatitis B Surface Ag: NONREACTIVE

## 2022-12-04 LAB — TISSUE TRANSGLUTAMINASE, IGA: (tTG) Ab, IgA: <1 U/mL

## 2022-12-04 LAB — HEPATITIS A ANTIBODY, TOTAL: Hepatitis A AB,Total: NONREACTIVE

## 2022-12-04 LAB — ANTI-SMOOTH MUSCLE ANTIBODY, IGG: Actin (Smooth Muscle) Antibody (IGG): <20 U (ref <20)

## 2022-12-04 LAB — MITOCHONDRIAL ANTIBODIES: Mitochondrial M2 Ab, IgG: <=20 U (ref <=20.0)

## 2022-12-04 LAB — ANA: Anti Nuclear Antibody (ANA): NEGATIVE

## 2022-12-04 LAB — IGA: Immunoglobulin A: 224 mg/dL (ref 70–320)

## 2023-01-02 ENCOUNTER — Ambulatory Visit (INDEPENDENT_AMBULATORY_CARE_PROVIDER_SITE_OTHER): Payer: 59 | Admitting: Nurse Practitioner

## 2023-01-02 ENCOUNTER — Encounter: Payer: Self-pay | Admitting: Nurse Practitioner

## 2023-01-02 VITALS — BP 132/86 | HR 93 | Temp 98.7°F | Ht 69.0 in | Wt 189.2 lb

## 2023-01-02 DIAGNOSIS — Z0289 Encounter for other administrative examinations: Secondary | ICD-10-CM

## 2023-01-02 DIAGNOSIS — M797 Fibromyalgia: Secondary | ICD-10-CM | POA: Diagnosis not present

## 2023-01-02 MED ORDER — KETOROLAC TROMETHAMINE 60 MG/2ML IM SOLN
60.0000 mg | Freq: Once | INTRAMUSCULAR | Status: AC
Start: 2023-01-02 — End: 2023-01-02
  Administered 2023-01-02: 60 mg via INTRAMUSCULAR

## 2023-01-02 MED ORDER — HYDROCODONE-ACETAMINOPHEN 10-325 MG PO TABS: 1.0000 | ORAL_TABLET | Freq: Three times a day (TID) | ORAL | 0 refills | Status: AC | PRN

## 2023-01-02 MED ORDER — HYDROCODONE-ACETAMINOPHEN 10-325 MG PO TABS: 1.0000 | ORAL_TABLET | Freq: Three times a day (TID) | ORAL | 0 refills | Status: DC | PRN

## 2023-01-02 NOTE — Patient Instructions (Signed)
Muscle Pain, Adult Muscle pain, also called myalgia, is a condition in which a person has pain in one or more muscles in the body. The pain may be mild, moderate, or severe. It may feel sharp, achy, or burning. In most cases, the pain lasts only a short time and goes away on its own. It is normal to feel some muscle pain after you start a new exercise program. Muscles that have not been used a lot will be sore at first. What are the causes? You may have muscle pain when you use your muscles in a new or different way after not having used them for some time. Muscle pain can also be caused by overuse or by stretching a muscle beyond its normal length (muscle strain). You may be more likely to have muscle pain if you are not in shape. Other causes may include: Injury or bruising. Infectious diseases. These include diseases caused by viruses, such as the flu (influenza). Fibromyalgia. This is a long-term (chronic) condition that causes muscle tenderness, tiredness (fatigue), and headache. Autoimmune or rheumatologic diseases. These are conditions, such as lupus, that cause the body's defense system (immune system) to attack areas in the body. Certain medicines. These include ACE inhibitors and statins. What are the signs or symptoms? The main symptom is sore or painful muscles. Your muscles may be sore when you do activities and when you stretch. You may also have slight swelling. How is this diagnosed? Muscle pain is diagnosed with a physical exam. Your health care provider will ask questions about your pain and when it began. If you have not had muscle pain for very long, your provider may want to wait before doing much testing. If your muscle pain has lasted a long time, tests may be done right away. In some cases, you may need tests to rule out other conditions and diseases. How is this treated? Treatment for muscle pain depends on the cause. Home care is usually enough to relieve the pain. Your  provider may also prescribe NSAIDs, such as ibuprofen. Follow these instructions at home: Medicines Take over-the-counter and prescription medicines only as told by your provider. Ask your health care provider if the medicine prescribed to you requires you to avoid driving or using machinery. Managing pain, swelling, and discomfort     If told, put ice on the painful area for the first 2 days of soreness. Put ice in a plastic bag. Place a towel between your skin and the bag. Leave the ice on for 20 minutes, 2-3 times a day. If your skin turns bright red, remove the ice right away to prevent skin damage. The risk of damage is higher if you cannot feel pain, heat, or cold. For the first 2 days of muscle soreness, or if there is swelling: Do not soak in hot baths. Do not use a hot tub, steam room, sauna, heating pad, or other heat source. After 2-3 days, you may switch between putting ice and heat on the area. If told, apply heat to the affected area as often as told by your provider. Use the heat source that your recommends, such as a moist heat pack or a heating pad. Place a towel between your skin and the heat source. Leave the heat on for 20-30 minutes. If your skin turns bright red, remove the ice or heat right away to prevent skin damage. The risk of damage is higher if you cannot feel pain, heat, or cold. If you have an injury,   raise (elevate) the injured area above the level of your heart while you are sitting or lying down. Activity  If your muscle pain is caused by overuse: Slow down your activities until the pain goes away. Do regular, gentle exercises if you are not normally active. Warm up before you exercise. Stretch before and after you exercise. This can help lower the risk of muscle pain. Do not keep working out if the pain is severe. Severe pain could mean that you have injured a muscle. You may have to avoid lifting. Ask your provider how much you can safely lift. Return  to your normal activities as told by your provider. Ask your provider what activities are safe for you. General instructions Do not use any products that contain nicotine or tobacco. These products include cigarettes, chewing tobacco, and vaping devices, such as e-cigarettes. If you need help quitting, ask your provider. Contact a health care provider if: Your muscle pain gets worse, and medicines do not help. The muscle pain lasts longer than 3 days. You have a rash or fever. You have muscle pain after a tick bite. You have muscle pain when you work out, even though you are in good shape. You have redness, soreness, or swelling. You have muscle pain after you start a new medicine or change the dose of a medicine. Get help right away if: You have trouble breathing. You have trouble swallowing. You have muscle pain along with a stiff neck, fever, and vomiting. You have severe muscle weakness, or you cannot move part of your body. You are urinating less, or you have dark, bloody, or discolored urine. You have redness or swelling at the site of the muscle pain. These symptoms may be an emergency. Get help right away. Call 911. Do not wait to see if the symptoms will go away. Do not drive yourself to the hospital. This information is not intended to replace advice given to you by your health care provider. Make sure you discuss any questions you have with your health care provider. Document Revised: 01/04/2022 Document Reviewed: 01/04/2022 Elsevier Patient Education  2024 Elsevier Inc.  

## 2023-01-02 NOTE — Progress Notes (Signed)
Subjective:    Patient ID: [name], female    DOB: 10-May-1961, 62 y.o.   MRN: [id number]   Chief Complaint: Pain Management   HPI Pain assessment: Cause of pain- fibromyalgia Pain location- varies from day to day Pain on scale of 1-10- 8/10 Frequency- daily What increases pain- weather What makes pain Better-rest helps Effects on ADL - none Any change in general medical condition-none  Current opioids rx- norco 10/325 # meds rx- 90 Effectiveness of current meds-helps Adverse reactions from pain meds-none Morphine equivalent-  Pill count performed-No Last drug screen - n/a ( high risk q45m, moderate risk q59m, low risk yearly ) Urine drug screen today- Yes Was the [facility] reviewed- yes  If yes were their any concerning findings? - no   Overdose risk: 1    08/12/2019    9:46 AM  Opioid Risk   Alcohol 0  Illegal Drugs 0  Rx Drugs 0  Alcohol 0  Illegal Drugs 0  Rx Drugs 0  Age between 16-45 years  0  History of Preadolescent Sexual Abuse 0  Psychological Disease 0  Depression 0  Opioid Risk Tool Scoring 0  Opioid Risk Interpretation Low Risk     Pain contract signed on:  Patient Active Problem List   Diagnosis Date Noted   S/P total left hip arthroplasty 03/28/2022   Primary hypertension 02/19/2021   Pain management contract agreement 05/16/2016   Insomnia 04/13/2015   BMI 26.0-26.9,adult 04/13/2015   Chronic nausea 10/12/2014   GAD (generalized anxiety disorder) 05/25/2014   Hyperlipidemia 11/04/2012   Migraines 11/04/2012   GERD (gastroesophageal reflux disease) 11/04/2012   Fibromyalgia 11/04/2012   TMJ arthropathy 11/04/2012        Review of Systems  Constitutional:  Negative for diaphoresis.  Eyes:  Negative for pain.  Respiratory:  Negative for shortness of breath.   Cardiovascular:  Negative for chest pain, palpitations and leg swelling.  Gastrointestinal:  Negative for abdominal pain.  Endocrine: Negative for polydipsia.   Skin:  Negative for rash.  Neurological:  Negative for dizziness, weakness and headaches.  Hematological:  Does not bruise/bleed easily.  All other systems reviewed and are negative.      Objective:   Physical Exam Vitals and nursing note reviewed.  Constitutional:      General: She is not in acute distress.    Appearance: Normal appearance. She is well-developed.  Neck:     Vascular: No carotid bruit or JVD.  Cardiovascular:     Rate and Rhythm: Normal rate and regular rhythm.     Heart sounds: Normal heart sounds.  Pulmonary:     Effort: Pulmonary effort is normal. No respiratory distress.     Breath sounds: Normal breath sounds. No wheezing or rales.  Chest:     Chest wall: No tenderness.  Abdominal:     General: Bowel sounds are normal. There is no distension or abdominal bruit.     Palpations: Abdomen is soft. There is no hepatomegaly, splenomegaly, mass or pulsatile mass.     Tenderness: There is no abdominal tenderness.  Musculoskeletal:        General: Normal range of motion.     Cervical back: Normal range of motion and neck supple.  Lymphadenopathy:     Cervical: No cervical adenopathy.  Skin:    General: Skin is warm and dry.  Neurological:     Mental Status: She is alert and oriented to person, place, and time.  Deep Tendon Reflexes: Reflexes are normal and symmetric.  Psychiatric:        Behavior: Behavior normal.        Thought Content: Thought content normal.        Judgment: Judgment normal.    BP 132/86   Pulse 93   Temp 98.7 F (37.1 C) (Temporal)   Ht 5\' 9"  (1.753 m)   Wt 189 lb 3.2 oz (85.8 kg)   SpO2 95%   BMI 27.94 kg/m         Assessment & Plan:   [name] in today with chief complaint of Pain Management   1. Fibromyalgia 2. Pain management contract agreement Moist heat Stay warm  Meds ordered this encounter  Medications   HYDROcodone-acetaminophen (NORCO) 10-325 MG tablet    Sig: Take 1 tablet by mouth every 8  (eight) hours as needed.    Dispense:  90 tablet    Refill:  0    Order Specific Question:   Supervising Provider    Answer:   Arville Care A [[id number]]   HYDROcodone-acetaminophen (NORCO) 10-325 MG tablet    Sig: Take 1 tablet by mouth every 8 (eight) hours as needed.    Dispense:  90 tablet    Refill:  0    Order Specific Question:   Supervising Provider    Answer:   Arville Care A [[id number]]   HYDROcodone-acetaminophen (NORCO) 10-325 MG tablet    Sig: Take 1 tablet by mouth every 8 (eight) hours as needed.    Dispense:  90 tablet    Refill:  0    Order Specific Question:   Supervising Provider    Answer:   Arville Care A [[id number]]   ketorolac (TORADOL) injection 60 mg      The above assessment and management plan was discussed with the patient. The patient verbalized understanding of and has agreed to the management plan. Patient is aware to call the clinic if symptoms persist or worsen. Patient is aware when to return to the clinic for a follow-up visit. Patient educated on when it is appropriate to go to the emergency department.   Mary-Margaret Daphine Deutscher, FNP

## 2023-01-02 NOTE — Addendum Note (Signed)
Addended by: Ignacia Bayley on: 01/02/2023 01:02 PM   Modules accepted: Orders

## 2023-01-06 ENCOUNTER — Ambulatory Visit: Payer: 59 | Admitting: Nurse Practitioner

## 2023-01-06 ENCOUNTER — Encounter: Payer: Self-pay | Admitting: Nurse Practitioner

## 2023-01-06 VITALS — BP 111/74 | HR 85 | Temp 97.7°F | Ht 69.0 in | Wt 193.6 lb

## 2023-01-06 DIAGNOSIS — J029 Acute pharyngitis, unspecified: Secondary | ICD-10-CM

## 2023-01-06 DIAGNOSIS — J069 Acute upper respiratory infection, unspecified: Secondary | ICD-10-CM | POA: Diagnosis not present

## 2023-01-06 DIAGNOSIS — R0981 Nasal congestion: Secondary | ICD-10-CM

## 2023-01-06 LAB — RAPID STREP SCREEN (MED CTR MEBANE ONLY): Strep Gp A Ag, IA W/Reflex: NEGATIVE

## 2023-01-06 LAB — CULTURE, GROUP A STREP

## 2023-01-06 MED ORDER — FLUTICASONE PROPIONATE 50 MCG/ACT NA SUSP
2.0000 | Freq: Every day | NASAL | 6 refills | Status: AC
Start: 2023-01-06 — End: ?

## 2023-01-06 NOTE — Progress Notes (Signed)
Acute Office Visit  Subjective:     Patient ID: [name], female    DOB: 1960/07/17, 62 y.o.   MRN: [id number]  Chief Complaint  Patient presents with   Sore Throat    Started hurting last Thursday, upset stomach, headache, low grade fever.    HPI Sore Throat: Patient complains of sore throat. Associated symptoms include chest congestion, chills, headache, nasal blockage, post nasal drip, sinus and nasal congestion, and sore throat.Onset of symptoms was 5 days ago, gradually improving since that time. She is drinking plenty of fluids. She has had recent close exposure to someone with proven streptococcal pharyngitis. " My grand son was dx with Strep and he has bene sleeping with me. Complaining of nasal congestion  Review of Systems  Constitutional:  Negative for chills and fever.  HENT:  Positive for congestion and sore throat.   Respiratory:  Negative for cough and wheezing.   Cardiovascular:  Negative for chest pain.  Musculoskeletal:  Negative for myalgias.  Skin:  Negative for itching and rash.   Negative unless indicated in HPI    Objective:    BP 111/74   Pulse 85   Temp 97.7 F (36.5 C) (Temporal)   Ht 5\' 9"  (1.753 m)   Wt 193 lb 9.6 oz (87.8 kg)   SpO2 97%   BMI 28.59 kg/m  BP Readings from Last 3 Encounters:  01/06/23 111/74  01/02/23 132/86  11/27/22 122/80   Wt Readings from Last 3 Encounters:  01/06/23 193 lb 9.6 oz (87.8 kg)  01/02/23 189 lb 3.2 oz (85.8 kg)  11/27/22 190 lb (86.2 kg)      Physical Exam Constitutional:      General: She is in acute distress.     Appearance: Normal appearance.  HENT:     Head: Normocephalic and atraumatic.     Nose:     Right Sinus: Maxillary sinus tenderness present.     Left Sinus: Maxillary sinus tenderness present.     Mouth/Throat:     Lips: Pink.     Mouth: Mucous membranes are moist.     Pharynx: Oropharynx is clear. Uvula midline. Postnasal drip present. No pharyngeal swelling, oropharyngeal  exudate, posterior oropharyngeal erythema or uvula swelling.  Eyes:     Conjunctiva/sclera: Conjunctivae normal.     Pupils: Pupils are equal, round, and reactive to light.  Cardiovascular:     Rate and Rhythm: Normal rate and regular rhythm.  Pulmonary:     Effort: Pulmonary effort is normal.     Breath sounds: Normal breath sounds.  Skin:    General: Skin is warm and dry.     Findings: No rash.  Neurological:     Mental Status: She is alert and oriented to person, place, and time. Mental status is at baseline.     No results found for any visits on 01/06/23.      Assessment & Plan:  Sore throat -     Rapid Strep Screen ([facility] ONLY) -     Culture, Group A Strep -     Fluticasone Propionate; Place 2 sprays into both nostrils daily.  Dispense: 16 g; Refill: 6  Nasal congestion -     Fluticasone Propionate; Place 2 sprays into both nostrils daily.  Dispense: 16 g; Refill: 6  Viral URI -     Fluticasone Propionate; Place 2 sprays into both nostrils daily.  Dispense: 16 g; Refill: 6   lianet deantonio is a 62  yrs old female no acute distress Strep negative Culture ordered awaiting for results Nasal congestion : Flonase nasal spray Increase hydration Tylenol Ibuprofen for fever  Continue healthy lifestyle choices, including diet (rich in fruits, vegetables, and lean proteins, and low in salt and simple carbohydrates) and exercise (at least 30 minutes of moderate physical activity daily).     The above assessment and management plan was discussed with the patient. The patient verbalized understanding of and has agreed to the management plan. Patient is aware to call the clinic if they develop any new symptoms or if symptoms persist or worsen. Patient is aware when to return to the clinic for a follow-up visit. Patient educated on when it is appropriate to go to the emergency department.  Return if symptoms worsen or fail to improve.  Arrie Aran Santa Lighter, DNP [facility] [address] [place], [place] [address] [phone]

## 2023-01-27 ENCOUNTER — Ambulatory Visit: Payer: 59 | Admitting: Dietician

## 2023-02-06 ENCOUNTER — Ambulatory Visit: Payer: 59 | Admitting: Nurse Practitioner

## 2023-02-06 ENCOUNTER — Encounter: Payer: Self-pay | Admitting: Nurse Practitioner

## 2023-02-06 VITALS — BP 100/60 | HR 108 | Ht 68.5 in | Wt 192.0 lb

## 2023-02-06 DIAGNOSIS — K219 Gastro-esophageal reflux disease without esophagitis: Secondary | ICD-10-CM | POA: Diagnosis not present

## 2023-02-06 DIAGNOSIS — K760 Fatty (change of) liver, not elsewhere classified: Secondary | ICD-10-CM

## 2023-02-06 MED ORDER — SUCRALFATE 1 G PO TABS: 1.0000 g | ORAL_TABLET | Freq: Two times a day (BID) | ORAL | Status: AC

## 2023-02-06 MED ORDER — PANTOPRAZOLE SODIUM 40 MG PO TBEC
40.0000 mg | DELAYED_RELEASE_TABLET | Freq: Two times a day (BID) | ORAL | 1 refills | Status: DC
Start: 2023-02-06 — End: 2023-04-07

## 2023-02-06 NOTE — Progress Notes (Signed)
ASSESSMENT & PLAN   62 y.o.  female known to Dr. Russella Dar     Chronic GERD with hiatal hernia / history of mild erosive esophagitis.  Recent flare in symptoms now improved after dietary changes and Carafate.  -- Continue twice daily pantoprazole before meals --Continue antireflux measures as discussed today -- Will refill Carafate to take twice daily if needed separating it from pantoprazole by 2 hours.  -- Follow-up in 1 year, sooner if needed  Elevated liver enzymes (chronic) < 2 x U.  Likely secondary to fatty liver demonstrated on ultrasound. Hepatic serologic workup for other etiologies of abnormal liver enzymes was negative.  -- Continue to follow a healthy diet.  Strive to achieve and maintain healthy body weight -- She has no history of significant alcohol use which is helpful -- Triglycerides were normal in December 2023  History of colon polyps. Two diminutive tubular adenomas removed in 2021.  A 7-year recall was recommended    HPI   Brief GI history I saw patient the office 11/27/2022 for exacerbation of GERD symptoms and epigastric discomfort.  Exacerbation of GERD symptoms thought to possibly be related to recent weight gain. Also noted that she had chronic but mild elevation of her liver enzymes.  She was continued on twice daily PPI, antireflux measures were discussed.  She was given a trial of Carafate for epigastric discomfort.  lipase was checked ( normal).  RUQ and complete hepatic serologic workup obtained for evaluation of abnormal LFTs.  Liver labs were normal.  Ultrasound suggested fatty liver infiltration.  Here for follow-up  Chief complaint : Here for follow-up on exacerbation of GERD symptoms, epigastric pain and abnormal liver tests  [name] is feeling much better . Carafate really helped with the GI distress she was experiencing .  She has been eating better, trying to avoid fast foods.  She has been out of Carafate for a couple of weeks . Waiting to see a  dietitian in November to help with her diet as she was recently diagnosed with diabetes.  No GI complaints. Bowel movement are at baseline. Not currently needing Miralax.       Latest Ref Rng & Units 09/12/2022    4:08 PM 06/04/2022    8:44 AM 10/13/2020    2:22 PM  Hepatic Function  Total Protein 6.0 - 8.5 g/dL 7.0  7.0  6.7   Albumin 3.9 - 4.9 g/dL 4.2  4.3  4.2   AST 0 - 40 IU/L 44  49  32   ALT 0 - 32 IU/L 48  53  35   Alk Phosphatase 44 - 121 IU/L 109  139  84   Total Bilirubin 0.0 - 1.2 mg/dL 0.3  0.2  0.2        Latest Ref Rng & Units 09/12/2022    4:08 PM 06/04/2022    8:44 AM 03/18/2022    9:55 AM  CBC  WBC 3.4 - 10.8 x10E3/uL 5.6  4.7  5.9   Hemoglobin 11.1 - 15.9 g/dL 40.9  81.1  91.4   Hematocrit 34.0 - 46.6 % 43.7  43.9  42.6   Platelets 150 - 450 x10E3/uL 206  225  172      Past Medical History:  Diagnosis Date   Arthritis    right hand   Dyslipidemia    Family history of anesthesia complication    patients mother has severe nausea and vomiting after   Fibromyalgia    GERD (  gastroesophageal reflux disease)    Guillain-Barre (HCC)    Guillain-Barre syndrome (HCC)    Hiatal hernia    History of kidney stones    Hypertension    pre   Kidney stones    Migraines    one/month   Pneumonia    PONV (postoperative nausea and vomiting)    TMJ (dislocation of temporomandibular joint)    UTI (urinary tract infection)    hx of   Vitamin D deficiency     Past Surgical History:  Procedure Laterality Date   ABDOMINAL HYSTERECTOMY     ADNOIDS     ANTERIOR CERVICAL DECOMP/DISCECTOMY FUSION N/A 10/27/2013   Procedure: ACDF/ANTERIOR CERVICAL DECOMPRESSION/DISCECTOMY FUSION  C5-C7  (2 LEVELS);  Surgeon: Venita Lick, MD;  Location: [facility] OR;  Service: Orthopedics;  Laterality: N/A;   BACK SURGERY     spinal   COLONOSCOPY  05/26/2020   EYE SURGERY Bilateral    cataract removal   KNEE ARTHROSCOPY Right    TOTAL HIP ARTHROPLASTY Left 03/28/2022   Procedure: TOTAL HIP  ARTHROPLASTY ANTERIOR APPROACH;  Surgeon: Durene Romans, MD;  Location: [facility] ORS;  Service: Orthopedics;  Laterality: Left;   TOTAL KNEE ARTHROPLASTY Right 06/30/2020   Procedure: TOTAL KNEE ARTHROPLASTY;  Surgeon: Beverely Low, MD;  Location: [facility] OR[facility];  Service: Orthopedics;  Laterality: Right;   UPPER GASTROINTESTINAL ENDOSCOPY  05/26/2020    Family History  Problem Relation Age of Onset   Heart disease Mother    Cancer Father        STOMACH CANCER   Stomach cancer Father    Cancer Maternal Grandfather    Diabetes Paternal Grandmother    Rectal cancer Neg Hx    Esophageal cancer Neg Hx    Colon cancer Neg Hx     Current Medications, Allergies, Family History and Social History were reviewed in [employer] electronic medical record.     Current Outpatient Medications  Medication Sig Dispense Refill   celecoxib (CELEBREX) 200 MG capsule Take 1 capsule (200 mg total) by mouth 2 (two) times daily. 60 capsule 5   fluticasone (FLONASE) 50 MCG/ACT nasal spray Place 2 sprays into both nostrils daily. 16 g 6   HYDROcodone-acetaminophen (NORCO) 10-325 MG tablet Take 1 tablet by mouth every 8 (eight) hours as needed. 90 tablet 0   HYDROcodone-acetaminophen (NORCO) 10-325 MG tablet Take 1 tablet by mouth every 8 (eight) hours as needed. 90 tablet 0   Milnacipran (SAVELLA) 50 MG TABS tablet Take 1 tablet (50 mg total) by mouth 2 (two) times daily. 180 tablet 1   Multiple Vitamin (MULTIVITAMIN) tablet Take 1 tablet by mouth daily.     ondansetron (ZOFRAN ODT) 4 MG disintegrating tablet Take 1 tablet (4 mg total) by mouth every 8 (eight) hours as needed for nausea or vomiting. 30 tablet 1   pantoprazole (PROTONIX) 40 MG tablet Take 1 tablet (40 mg total) by mouth 2 (two) times daily. 180 tablet 1   potassium chloride SA (KLOR-CON M) 20 MEQ tablet Take 1 tablet (20 mEq total) by mouth daily. For 5 days 30 tablet 0   pravastatin (PRAVACHOL) 40 MG tablet Take 1 tablet (40 mg total) by mouth daily.  90 tablet 1   SUMAtriptan (IMITREX) 100 MG tablet TAKE 1 TABLET AT ONSET OF HEADACHE;,MAY REPEAT ONCE IN 24 HOURS 9 tablet 2   tiZANidine (ZANAFLEX) 4 MG tablet TAKE ONE TABLET AT BEDTIME AS NEEDED FOR MUSCLE SPASMS 90 tablet 1   Torsemide 40 MG TABS Take  1 tablet by mouth once for 1 dose. (Patient taking differently: Take 1 tablet by mouth daily.) 90 tablet 1   EPINEPHrine 0.3 mg/0.3 mL IJ SOAJ injection Inject 0.3 mLs (0.3 mg total) into the muscle once. (Patient not taking: Reported on 02/06/2023) 2 Device 1   No current facility-administered medications for this visit.    Review of Systems: No chest pain. No shortness of breath. No urinary complaints.    Physical Exam  Wt Readings from Last 3 Encounters:  02/06/23 192 lb (87.1 kg)  01/06/23 193 lb 9.6 oz (87.8 kg)  01/02/23 189 lb 3.2 oz (85.8 kg)    BP 100/60 (BP Location: Left Arm, Patient Position: Sitting, Cuff Size: Normal)   Pulse (!) 108   Ht 5' 8.5" (1.74 m) Comment: height measured without shoes  Wt 192 lb (87.1 kg)   BMI 28.77 kg/m  Constitutional:  Pleasant, generally well appearing female in no acute distress. Psychiatric: Normal mood and affect. Behavior is normal. EENT: Pupils normal.  Conjunctivae are normal. No scleral icterus. Neck supple.  Cardiovascular: Normal rate, regular rhythm.  Pulmonary/chest: Effort normal and breath sounds normal. No wheezing, rales or rhonchi. Abdominal: Soft, nondistended, nontender. Bowel sounds active throughout. There are no masses palpable. No hepatomegaly. Neurological: Alert and oriented to person place and time.    Willette Cluster, NP  02/06/2023, 2:22 PM

## 2023-02-06 NOTE — Patient Instructions (Signed)
We have sent the following medications to your pharmacy for you to pick up at your convenience: Carafate, Pantoprazole   Please contact office in Dec to schedule follow-up for Feb 2025.   ___________________________________  If your blood pressure at your visit was 140/90 or greater, please contact your primary care physician to follow up on this.  _______________________________________________________  If you are age 64 or older, your body mass index should be between 23-30. Your Body mass index is 28.77 kg/m. If this is out of the aforementioned range listed, please consider follow up with your Primary Care Provider.  If you are age 69 or younger, your body mass index should be between 19-25. Your Body mass index is 28.77 kg/m. If this is out of the aformentioned range listed, please consider follow up with your Primary Care Provider.   ________________________________________________________  The [facility] providers would like to encourage you to use [facility] to communicate with providers for non-urgent requests or questions.  Due to long hold times on the telephone, sending your provider a message by [facility] may be a faster and more efficient way to get a response.  Please allow 48 business hours for a response.  Please remember that this is for non-urgent requests.  _______________________________________________________  Thank you for choosing me and [facility].  Willette Cluster, NP

## 2023-04-07 ENCOUNTER — Ambulatory Visit: Payer: 59 | Admitting: Nurse Practitioner

## 2023-04-07 ENCOUNTER — Encounter: Payer: Self-pay | Admitting: Nurse Practitioner

## 2023-04-07 VITALS — BP 135/83 | HR 92 | Temp 98.2°F | Resp 20 | Ht 68.0 in | Wt 192.0 lb

## 2023-04-07 DIAGNOSIS — E118 Type 2 diabetes mellitus with unspecified complications: Secondary | ICD-10-CM

## 2023-04-07 DIAGNOSIS — M797 Fibromyalgia: Secondary | ICD-10-CM

## 2023-04-07 DIAGNOSIS — I10 Essential (primary) hypertension: Secondary | ICD-10-CM

## 2023-04-07 DIAGNOSIS — E7800 Pure hypercholesterolemia, unspecified: Secondary | ICD-10-CM

## 2023-04-07 DIAGNOSIS — K219 Gastro-esophageal reflux disease without esophagitis: Secondary | ICD-10-CM

## 2023-04-07 DIAGNOSIS — R600 Localized edema: Secondary | ICD-10-CM

## 2023-04-07 DIAGNOSIS — F5101 Primary insomnia: Secondary | ICD-10-CM

## 2023-04-07 DIAGNOSIS — F411 Generalized anxiety disorder: Secondary | ICD-10-CM

## 2023-04-07 DIAGNOSIS — Z6826 Body mass index (BMI) 26.0-26.9, adult: Secondary | ICD-10-CM

## 2023-04-07 LAB — BAYER DCA HB A1C WAIVED: HB A1C (BAYER DCA - WAIVED): 6.8 % — ABNORMAL HIGH (ref 4.8–5.6)

## 2023-04-07 MED ORDER — PRAVASTATIN SODIUM 40 MG PO TABS: 40.0000 mg | ORAL_TABLET | Freq: Every day | ORAL | 1 refills | Status: DC

## 2023-04-07 MED ORDER — TRIAMCINOLONE ACETONIDE 0.1 % EX CREA: TOPICAL_CREAM | Freq: Two times a day (BID) | CUTANEOUS | 3 refills | Status: AC

## 2023-04-07 MED ORDER — TRIAMCINOLONE ACETONIDE 0.1 % EX CREA: TOPICAL_CREAM | Freq: Two times a day (BID) | CUTANEOUS | 3 refills | Status: DC

## 2023-04-07 MED ORDER — HYDROCODONE-ACETAMINOPHEN 10-325 MG PO TABS: 1.0000 | ORAL_TABLET | Freq: Three times a day (TID) | ORAL | 0 refills | Status: AC | PRN

## 2023-04-07 MED ORDER — PANTOPRAZOLE SODIUM 40 MG PO TBEC: 40.0000 mg | DELAYED_RELEASE_TABLET | Freq: Two times a day (BID) | ORAL | 1 refills | Status: DC

## 2023-04-07 MED ORDER — TIZANIDINE HCL 4 MG PO TABS: ORAL_TABLET | ORAL | 1 refills | Status: DC

## 2023-04-07 MED ORDER — TORSEMIDE 40 MG PO TABS: 1.0000 | ORAL_TABLET | Freq: Once | ORAL | 1 refills | Status: DC

## 2023-04-07 MED ORDER — KETOROLAC TROMETHAMINE 60 MG/2ML IM SOLN
60.0000 mg | Freq: Once | INTRAMUSCULAR | Status: AC
  Administered 2023-04-07: 60 mg via INTRAMUSCULAR

## 2023-04-07 MED ORDER — HYDROCODONE-ACETAMINOPHEN 10-325 MG PO TABS: 1.0000 | ORAL_TABLET | Freq: Three times a day (TID) | ORAL | 0 refills | Status: DC | PRN

## 2023-04-07 MED ORDER — SAVELLA 50 MG PO TABS: 50.0000 mg | ORAL_TABLET | Freq: Two times a day (BID) | ORAL | 1 refills | Status: DC

## 2023-04-07 MED ORDER — METHYLPREDNISOLONE ACETATE 80 MG/ML IJ SUSP
80.0000 mg | Freq: Once | INTRAMUSCULAR | Status: AC
  Administered 2023-04-07: 80 mg via INTRAMUSCULAR

## 2023-04-07 NOTE — Progress Notes (Signed)
Subjective:    Patient ID: [name], female    DOB: 07-15-1960, 62 y.o.   MRN: [id number]   Chief Complaint: medical management of chronic issues     HPI:  [name] is a 62 y.o. who identifies as a female who was assigned female at birth.   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 Primary hypertension No c/o chest pain, sob or headache. Does not check blood pressure at home. BP Readings from Last 3 Encounters:  02/06/23 100/60  01/06/23 111/74  01/02/23 132/86     2. Pure hypercholesterolemia Does try to watch diet and stay active. Does no dedicated exercise.  3. Gastroesophageal reflux disease, unspecified whether esophagitis present Is on protonix and is doing well  4. Fibromyalgia Takes savella and pain meds daily. Pain assessment: Cause of pain- fibromyalgia Pain location- varies from day to day Pain on scale of 1-10- 10/10 today Frequency- daily What increases pain-nothing really What makes pain Better-rest Effects on ADL - none Any change in general medical condition-none  Current opioids rx- norco 10/325 TID # meds rx- 90 Effectiveness of current meds-helps Adverse reactions from pain meds-none Morphine equivalent- 30 MME  Pill count performed-No Last drug screen - 01/02/23 ( high risk q33m, moderate risk q23m, low risk yearly ) Urine drug screen today- No Was the NCCSR reviewed- yes  If yes were their any concerning findings? - no   Overdose risk: 1    08/12/2019    9:46 AM  Opioid Risk   Alcohol 0  Illegal Drugs 0  Rx Drugs 0  Alcohol 0  Illegal Drugs 0  Rx Drugs 0  Age between 16-45 years  0  History of Preadolescent Sexual Abuse 0  Psychological Disease 0  Depression 0  Opioid Risk Tool Scoring 0  Opioid Risk Interpretation Low Risk     Pain contract signed on:01/07/23   5. GAD (generalized anxiety disorder) On  no meds and is doing well    09/24/2022    8:11 AM 09/12/2022    3:39 PM 08/16/2022   11:05 AM 06/04/2022    8:13 AM  GAD 7 : Generalized Anxiety Score  Nervous, Anxious, on Edge 0 0 0 1  Control/stop worrying 0 0 0 1  Worry too much - different things 0 0 0 1  Trouble relaxing 0 0 0 3  Restless 0 0 0 1  Easily annoyed or irritable 1 1 0 1  Afraid - awful might happen 0 0 0 1  Total GAD 7 Score 1 1 0 9  Anxiety Difficulty Not difficult at all Not difficult at all Not difficult at all Somewhat difficult      6. Primary insomnia On no sleep aids  7. BMI 26.0-26.9,adult No recent weight changes Wt Readings from Last 3 Encounters:  02/06/23 192 lb (87.1 kg)  01/06/23 193 lb 9.6 oz (87.8 kg)  01/02/23 189 lb 3.2 oz (85.8 kg)   BMI Readings from Last 3 Encounters:  02/06/23 28.77 kg/m  01/06/23 28.59 kg/m  01/02/23 27.94 kg/m      New complaints: Dry itchy spots on trunk mainly  Allergies  Allergen Reactions   Other Anaphylaxis and Itching    Plastic bottles; patient drank from a water bottle and had to use Epi pen  Yeast infection from antibiotics   Peanuts [Peanut Oil] Anaphylaxis  Statins Other (See Comments)    Severe joint pain   Tape Other (See Comments)    Causes red blisters on skin. Can use paper tape   Outpatient Encounter Medications as of 04/07/2023  Medication Sig   celecoxib (CELEBREX) 200 MG capsule Take 1 capsule (200 mg total) by mouth 2 (two) times daily.   EPINEPHrine 0.3 mg/0.3 mL IJ SOAJ injection Inject 0.3 mLs (0.3 mg total) into the muscle once. (Patient not taking: Reported on 02/06/2023)   fluticasone (FLONASE) 50 MCG/ACT nasal spray Place 2 sprays into both nostrils daily.   Milnacipran (SAVELLA) 50 MG TABS tablet Take 1 tablet (50 mg total) by mouth 2 (two) times daily.   Multiple Vitamin (MULTIVITAMIN) tablet Take 1 tablet by mouth daily.   ondansetron (ZOFRAN ODT) 4 MG disintegrating tablet Take 1 tablet (4 mg total) by mouth every 8  (eight) hours as needed for nausea or vomiting.   pantoprazole (PROTONIX) 40 MG tablet Take 1 tablet (40 mg total) by mouth 2 (two) times daily.   potassium chloride SA (KLOR-CON M) 20 MEQ tablet Take 1 tablet (20 mEq total) by mouth daily. For 5 days   pravastatin (PRAVACHOL) 40 MG tablet Take 1 tablet (40 mg total) by mouth daily.   sucralfate (CARAFATE) 1 g tablet Take 1 tablet (1 g total) by mouth 2 (two) times daily for 14 days.   SUMAtriptan (IMITREX) 100 MG tablet TAKE 1 TABLET AT ONSET OF HEADACHE;,MAY REPEAT ONCE IN 24 HOURS   tiZANidine (ZANAFLEX) 4 MG tablet TAKE ONE TABLET AT BEDTIME AS NEEDED FOR MUSCLE SPASMS   Torsemide 40 MG TABS Take 1 tablet by mouth once for 1 dose. (Patient taking differently: Take 1 tablet by mouth daily.)   No facility-administered encounter medications on file as of 04/07/2023.    Past Surgical History:  Procedure Laterality Date   ABDOMINAL HYSTERECTOMY     ADNOIDS     ANTERIOR CERVICAL DECOMP/DISCECTOMY FUSION N/A 10/27/2013   Procedure: ACDF/ANTERIOR CERVICAL DECOMPRESSION/DISCECTOMY FUSION  C5-C7  (2 LEVELS);  Surgeon: Venita Lick, MD;  Location: [facility] OR;  Service: Orthopedics;  Laterality: N/A;   BACK SURGERY     spinal   COLONOSCOPY  05/26/2020   EYE SURGERY Bilateral    cataract removal   KNEE ARTHROSCOPY Right    TOTAL HIP ARTHROPLASTY Left 03/28/2022   Procedure: TOTAL HIP ARTHROPLASTY ANTERIOR APPROACH;  Surgeon: Durene Romans, MD;  Location: [facility];  Service: Orthopedics;  Laterality: Left;   TOTAL KNEE ARTHROPLASTY Right 06/30/2020   Procedure: TOTAL KNEE ARTHROPLASTY;  Surgeon: Beverely Low, MD;  Location: [facility];  Service: Orthopedics;  Laterality: Right;   UPPER GASTROINTESTINAL ENDOSCOPY  05/26/2020    Family History  Problem Relation Age of Onset   Heart disease Mother    Cancer Father        STOMACH CANCER   Stomach cancer Father    Cancer Maternal Grandfather    Diabetes Paternal Grandmother    Rectal cancer Neg Hx     Esophageal cancer Neg Hx    Colon cancer Neg Hx       Controlled substance contract: n/a     Review of Systems  Constitutional:  Negative for diaphoresis.  Eyes:  Negative for pain.  Respiratory:  Negative for shortness of breath.   Cardiovascular:  Negative for chest pain, palpitations and leg swelling.  Gastrointestinal:  Negative for abdominal pain.  Endocrine: Negative for polydipsia.  Skin:  Negative for rash.  Neurological:  Negative for  dizziness, weakness and headaches.  Hematological:  Does not bruise/bleed easily.  All other systems reviewed and are negative.      Objective:   Physical Exam Vitals and nursing note reviewed.  Constitutional:      General: She is not in acute distress.    Appearance: Normal appearance. She is well-developed.  HENT:     Head: Normocephalic.     Right Ear: Tympanic membrane normal.     Left Ear: Tympanic membrane normal.     Nose: Nose normal.     Mouth/Throat:     Mouth: Mucous membranes are moist.  Eyes:     Pupils: Pupils are equal, round, and reactive to light.  Neck:     Vascular: No carotid bruit or JVD.  Cardiovascular:     Rate and Rhythm: Normal rate and regular rhythm.     Heart sounds: Normal heart sounds.  Pulmonary:     Effort: Pulmonary effort is normal. No respiratory distress.     Breath sounds: Normal breath sounds. No wheezing or rales.  Chest:     Chest wall: No tenderness.  Abdominal:     General: Bowel sounds are normal. There is no distension or abdominal bruit.     Palpations: Abdomen is soft. There is no hepatomegaly, splenomegaly, mass or pulsatile mass.     Tenderness: There is no abdominal tenderness.  Musculoskeletal:        General: Normal range of motion.     Cervical back: Normal range of motion and neck supple.  Lymphadenopathy:     Cervical: No cervical adenopathy.  Skin:    General: Skin is warm and dry.  Neurological:     Mental Status: She is alert and oriented to person, place,  and time.     Deep Tendon Reflexes: Reflexes are normal and symmetric.  Psychiatric:        Behavior: Behavior normal.        Thought Content: Thought content normal.        Judgment: Judgment normal.     BP 135/83   Pulse 92   Temp 98.2 F (36.8 C) (Temporal)   Resp 20   Ht 5\' 8"  (1.727 m)   Wt 192 lb (87.1 kg)   SpO2 95%   BMI 29.19 kg/m   Hgba1c 6.8%      Assessment & Plan:  ONIDA HOUGHTON comes in today with chief complaint of Medical Management of Chronic Issues   Diagnosis and orders addressed:  1. Primary hypertension Low sodium diet - CBC with Differential/Platelet - CMP14+EGFR  2. Pure hypercholesterolemia Low fat diet - Lipid panel - pravastatin (PRAVACHOL) 40 MG tablet; Take 1 tablet (40 mg total) by mouth daily.  Dispense: 90 tablet; Refill: 1  3. Gastroesophageal reflux disease, unspecified whether esophagitis present Avoid spicy foods Do not eat 2 hours prior to bedtime - pantoprazole (PROTONIX) 40 MG tablet; Take 1 tablet (40 mg total) by mouth 2 (two) times daily.  Dispense: 180 tablet; Refill: 1  4. Fibromyalgia Keep legs warm - methylPREDNISolone acetate (DEPO-MEDROL) injection 80 mg - ketorolac (TORADOL) injection 60 mg - Milnacipran (SAVELLA) 50 MG TABS tablet; Take 1 tablet (50 mg total) by mouth 2 (two) times daily.  Dispense: 180 tablet; Refill: 1 - tiZANidine (ZANAFLEX) 4 MG tablet; TAKE ONE TABLET AT BEDTIME AS NEEDED FOR MUSCLE SPASMS  Dispense: 90 tablet; Refill: 1  5. GAD (generalized anxiety disorder) Stress management  6. Primary insomnia Bedtime   7. BMI 26.0-26.9,adult  Discussed diet and exercise for person with BMI >25 Will recheck weight in 3-6 months   8 Peripheral edema Elevate legs when sitting - Torsemide 40 MG TABS; Take 1 tablet by mouth once for 1 dose.  Dispense: 90 tablet; Refill: 1   Labs pending Health Maintenance reviewed Diet and exercise encouraged  Follow up plan: 6 months   Mary-Margaret  Daphine Deutscher, FNP

## 2023-04-07 NOTE — Addendum Note (Signed)
Addended by: Bennie Pierini on: 04/07/2023 03:12 PM   Modules accepted: Orders

## 2023-04-07 NOTE — Addendum Note (Signed)
Addended by: Julious Payer D on: 04/07/2023 02:38 PM   Modules accepted: Orders

## 2023-04-07 NOTE — Patient Instructions (Signed)

## 2023-04-08 ENCOUNTER — Encounter: Payer: Self-pay | Admitting: Nurse Practitioner

## 2023-04-08 DIAGNOSIS — E119 Type 2 diabetes mellitus without complications: Secondary | ICD-10-CM | POA: Insufficient documentation

## 2023-04-08 LAB — LIPID PANEL
Chol/HDL Ratio: 4.4 ratio (ref 0.0–4.4)
Cholesterol, Total: 172 mg/dL (ref 100–199)
HDL: 39 mg/dL — ABNORMAL LOW (ref >39)
LDL Chol Calc (NIH): 88 mg/dL (ref 0–99)
Triglycerides: 269 mg/dL — ABNORMAL HIGH (ref 0–149)
VLDL Cholesterol Cal: 45 mg/dL — ABNORMAL HIGH (ref 5–40)

## 2023-04-08 LAB — CMP14+EGFR
ALT: 72 [IU]/L — ABNORMAL HIGH (ref 0–32)
AST: 72 [IU]/L — ABNORMAL HIGH (ref 0–40)
Albumin: 4.6 g/dL (ref 3.9–4.9)
Alkaline Phosphatase: 105 [IU]/L (ref 44–121)
BUN/Creatinine Ratio: 17 (ref 12–28)
BUN: 14 mg/dL (ref 8–27)
Bilirubin Total: <0.2 mg/dL (ref 0.0–1.2)
CO2: 20 mmol/L (ref 20–29)
Calcium: 9.7 mg/dL (ref 8.7–10.3)
Chloride: 102 mmol/L (ref 96–106)
Creatinine, Ser: 0.84 mg/dL (ref 0.57–1.00)
Globulin, Total: 2.8 g/dL (ref 1.5–4.5)
Glucose: 130 mg/dL — ABNORMAL HIGH (ref 70–99)
Potassium: 4.1 mmol/L (ref 3.5–5.2)
Sodium: 141 mmol/L (ref 134–144)
Total Protein: 7.4 g/dL (ref 6.0–8.5)
eGFR: 78 mL/min/{1.73_m2} (ref >59)

## 2023-04-08 LAB — CBC WITH DIFFERENTIAL/PLATELET
Basophils Absolute: 0 10*3/uL (ref 0.0–0.2)
Basos: 0 %
EOS (ABSOLUTE): 0.1 10*3/uL (ref 0.0–0.4)
Eos: 1 %
Hematocrit: 44.6 % (ref 34.0–46.6)
Hemoglobin: 14.3 g/dL (ref 11.1–15.9)
Immature Grans (Abs): 0 10*3/uL (ref 0.0–0.1)
Immature Granulocytes: 0 %
Lymphocytes Absolute: 1.8 10*3/uL (ref 0.7–3.1)
Lymphs: 34 %
MCH: 28.8 pg (ref 26.6–33.0)
MCHC: 32.1 g/dL (ref 31.5–35.7)
MCV: 90 fL (ref 79–97)
Monocytes Absolute: 0.5 10*3/uL (ref 0.1–0.9)
Monocytes: 9 %
Neutrophils Absolute: 3 10*3/uL (ref 1.4–7.0)
Neutrophils: 56 %
Platelets: 174 10*3/uL (ref 150–450)
RBC: 4.96 x10E6/uL (ref 3.77–5.28)
RDW: 12.7 % (ref 11.7–15.4)
WBC: 5.5 10*3/uL (ref 3.4–10.8)

## 2023-04-28 ENCOUNTER — Encounter: Payer: Self-pay | Admitting: Nurse Practitioner

## 2023-04-28 ENCOUNTER — Ambulatory Visit: Payer: 59 | Admitting: Nurse Practitioner

## 2023-04-28 VITALS — BP 149/89 | HR 90 | Temp 97.8°F | Ht 68.0 in | Wt 193.8 lb

## 2023-04-28 DIAGNOSIS — B009 Herpesviral infection, unspecified: Secondary | ICD-10-CM

## 2023-04-28 DIAGNOSIS — H1032 Unspecified acute conjunctivitis, left eye: Secondary | ICD-10-CM

## 2023-04-28 MED ORDER — POLYMYXIN B-TRIMETHOPRIM 10000-0.1 UNIT/ML-% OP SOLN: 2.0000 [drp] | OPHTHALMIC | 0 refills | Status: DC

## 2023-04-28 MED ORDER — VALACYCLOVIR HCL 1 G PO TABS: 1000.0000 mg | ORAL_TABLET | Freq: Three times a day (TID) | ORAL | 0 refills | Status: DC

## 2023-04-28 NOTE — Progress Notes (Signed)
Subjective:    Patient ID: [name], female    DOB: 1960/09/13, 62 y.o.   MRN: [id number]   Chief Complaint: Conjunctivitis   Conjunctivitis  Pertinent negatives include no abdominal pain, no headaches, no rash and no eye pain.   Patient comes in with 2 complaints - pink eye- woke up yesterday afternoon with eye crusty and burning. Has worsened throughout today - flare up of herpes simplex II Patient Active Problem List   Diagnosis Date Noted   Diet-controlled diabetes mellitus (HCC) 04/08/2023   S/P total left hip arthroplasty 03/28/2022   Primary hypertension 02/19/2021   Pain management contract agreement 05/16/2016   Insomnia 04/13/2015   BMI 26.0-26.9,adult 04/13/2015   Chronic nausea 10/12/2014   GAD (generalized anxiety disorder) 05/25/2014   Hyperlipidemia 11/04/2012   Migraines 11/04/2012   GERD (gastroesophageal reflux disease) 11/04/2012   Fibromyalgia 11/04/2012   TMJ arthropathy 11/04/2012     Review of Systems  Constitutional:  Negative for diaphoresis.  Eyes:  Negative for pain.  Respiratory:  Negative for shortness of breath.   Cardiovascular:  Negative for chest pain, palpitations and leg swelling.  Gastrointestinal:  Negative for abdominal pain.  Endocrine: Negative for polydipsia.  Skin:  Negative for rash.  Neurological:  Negative for dizziness, weakness and headaches.  Hematological:  Does not bruise/bleed easily.  All other systems reviewed and are negative.      Objective:   Physical Exam Constitutional:      Appearance: Normal appearance.  Eyes:     General: No scleral icterus.       Left eye: Discharge present.    Extraocular Movements: Extraocular movements intact.     Pupils: Pupils are equal, round, and reactive to light.     Comments: Ft scleral injection with yellowish excudate  Cardiovascular:     Rate and Rhythm: Normal rate and regular rhythm.     Heart sounds: Normal heart sounds.  Pulmonary:     Effort: Pulmonary  effort is normal.     Breath sounds: Normal breath sounds.  Genitourinary:    Comments: No perineal exam done today Neurological:     General: No focal deficit present.     Mental Status: She is alert and oriented to person, place, and time.  Psychiatric:        Mood and Affect: Mood normal.        Behavior: Behavior normal.    BP (!) 149/89   Pulse 90   Temp 97.8 F (36.6 C) (Skin)   Ht 5\' 8"  (1.727 m)   Wt 193 lb 12.8 oz (87.9 kg)   BMI 29.47 kg/m         Assessment & Plan:   [name] in today with chief complaint of Conjunctivitis   1. Acute bacterial conjunctivitis of left eye Avoid rubbing eye Cool compresses Good handwashing - trimethoprim-polymyxin b (POLYTRIM) ophthalmic solution; Place 2 drops into both eyes every 4 (four) hours.  Dispense: 10 mL; Refill: 0  2. Herpes simplex infection Safe sex - valACYclovir (VALTREX) 1000 MG tablet; Take 1 tablet (1,000 mg total) by mouth 3 (three) times daily for 10 days.  Dispense: 21 tablet; Refill: 0    The above assessment and management plan was discussed with the patient. The patient verbalized understanding of and has agreed to the management plan. Patient is aware to call the clinic if symptoms persist or worsen. Patient is aware when to return to the clinic for a follow-up visit. Patient  educated on when it is appropriate to go to the emergency department.   Mary-Margaret Daphine Deutscher, FNP

## 2023-04-28 NOTE — Patient Instructions (Signed)
 Bacterial Conjunctivitis, Adult  Bacterial conjunctivitis is an infection of your conjunctiva. This is the clear membrane that covers the white part of your eye and the inner part of your eyelid. This infection can make your eye:  Red or pink.  Itchy or irritated.  This condition spreads easily from person to person (is contagious) and from one eye to the other eye.  What are the causes?  This condition is caused by germs (bacteria). You may get the infection if you come into close contact with:  A person who has the infection.  Items that have germs on them (are contaminated), such as face towels, contact lens solution, or eye makeup.  What increases the risk?  You are more likely to get this condition if:  You have contact with people who have the infection.  You wear contact lenses.  You have a sinus infection.  You have had a recent eye injury or surgery.  You have a weak body defense system (immune system).  You have dry eyes.  What are the signs or symptoms?    Thick, yellowish discharge from the eye.  Tearing or watery eyes.  Itchy eyes.  Burning feeling in your eyes.  Eye redness.  Swollen eyelids.  Blurred vision.  How is this treated?    Antibiotic eye drops or ointment.  Antibiotic medicine taken by mouth. This is used for infections that do not get better with drops or ointment or that last more than 10 days.  Cool, wet cloths placed on the eyes.  Artificial tears used 2-6 times a day.  Follow these instructions at home:  Medicines  Take or apply your antibiotic medicine as told by your doctor. Do not stop using it even if you start to feel better.  Take or apply over-the-counter and prescription medicines only as told by your doctor.  Do not touch your eyelid with the eye-drop bottle or the ointment tube.  Managing discomfort  Wipe any fluid from your eye with a warm, wet washcloth or a cotton ball.  Place a clean, cool, wet cloth on your eye. Do this for 10-20 minutes, 3-4 times a day.  General  instructions  Do not wear contacts until the infection is gone. Wear glasses until your doctor says it is okay to wear contacts again.  Do not wear eye makeup until the infection is gone. Throw away old eye makeup.  Change or wash your pillowcase every day.  Do not share towels or washcloths.  Wash your hands often with soap and water for at least 20 seconds and especially before touching your face or eyes. Use paper towels to dry your hands.  Do not touch or rub your eyes.  Do not drive or use heavy machinery if your vision is blurred.  Contact a doctor if:  You have a fever.  You do not get better after 10 days.  Get help right away if:  You have a fever and your symptoms get worse all of a sudden.  You have very bad pain when you move your eye.  Your face:  Hurts.  Is red.  Is swollen.  You have sudden loss of vision.  Summary  Bacterial conjunctivitis is an infection of your conjunctiva.  This infection spreads easily from person to person.  Wash your hands often with soap and water for at least 20 seconds and especially before touching your face or eyes. Use paper towels to dry your hands.  Take or apply your  antibiotic medicine as told by your doctor.  Contact a doctor if you have a fever or you do not get better after 10 days.  This information is not intended to replace advice given to you by your health care provider. Make sure you discuss any questions you have with your health care provider.  Document Revised: 09/06/2020 Document Reviewed: 09/06/2020  Elsevier Patient Education  2024 [employer].

## 2023-04-29 ENCOUNTER — Telehealth: Payer: Self-pay | Admitting: *Deleted

## 2023-04-29 ENCOUNTER — Other Ambulatory Visit: Payer: Self-pay | Admitting: Nurse Practitioner

## 2023-04-29 DIAGNOSIS — B009 Herpesviral infection, unspecified: Secondary | ICD-10-CM

## 2023-04-29 MED ORDER — VALACYCLOVIR HCL 1 G PO TABS
1000.0000 mg | ORAL_TABLET | Freq: Three times a day (TID) | ORAL | 0 refills | Status: AC
Start: 2023-04-29 — End: 2023-05-09

## 2023-04-29 NOTE — Telephone Encounter (Signed)
7 days is enough

## 2023-04-29 NOTE — Telephone Encounter (Signed)
Fax from [facility] pharmacy RE: Valacyclovir 1000 mg Note: for 10 days or 7 days? Only written for 7 day supply Please advise

## 2023-04-29 NOTE — Telephone Encounter (Signed)
Pharmacy aware

## 2023-04-30 ENCOUNTER — Ambulatory Visit: Payer: 59 | Admitting: Dietician

## 2023-07-03 NOTE — Patient Instructions (Signed)
 Our records indicate that you are due for your annual mammogram/breast imaging. While there is no way to prevent breast cancer, early detection provides the best opportunity for curing it. For women over the age of 55, the American Cancer Society recommends a yearly clinical breast exam and a yearly mammogram. These practices have saved thousands of lives. We need your help to ensure that you are receiving optimal medical care. Please call the imaging location that has done you previous mammograms. Please remember to list [place] as your primary care. This helps make sure we receive a report and can update your chart.  Below is the contact information for several local breast imaging centers. You may call the location that works best for you, and they will be happy to assistance in making you an appointment. You do not need an order for a regular screening mammogram. However, if you are having any problems or concerns with you breast area, please let your primary care provider know, and appropriate orders will be placed. Please let our office know if you have any questions or concerns. Or if you need information for another imaging center not on this list or outside of the area. We are commented to working with you on your health care journey.   The mobile unit/bus ([facility]) - they come twice a month to our location.  These appointments can be made through our office or by call The [facility]  [facility]  [address] [place], [place] [address] Phone [phone]  [facility] Radiology Department  [address] [place], [place] [address] [phone]  [facility] ([facility])  [phone] [place], [place] [address] [phone]  [facility] - [facility]  [address] [place] [address] [phone]  [facility]  [address] [place] [address] [phone]  [facility] Mammography in [place]  [address] [place], [place] [address] [phone]  [facility] [address] [place], [place] [address] [phone]  [facility] [address]  [address] [place], [place] [address] [phone]  [facility] [place], [place] [address] [phone]

## 2023-07-04 ENCOUNTER — Ambulatory Visit (INDEPENDENT_AMBULATORY_CARE_PROVIDER_SITE_OTHER): Payer: Medicare Other | Admitting: Nurse Practitioner

## 2023-07-04 ENCOUNTER — Encounter: Payer: Self-pay | Admitting: Nurse Practitioner

## 2023-07-04 VITALS — BP 133/89 | HR 110 | Temp 97.2°F | Ht 68.0 in | Wt 193.0 lb

## 2023-07-04 DIAGNOSIS — Z0289 Encounter for other administrative examinations: Secondary | ICD-10-CM

## 2023-07-04 DIAGNOSIS — M797 Fibromyalgia: Secondary | ICD-10-CM

## 2023-07-04 MED ORDER — HYDROCODONE-ACETAMINOPHEN 10-325 MG PO TABS: 1.0000 | ORAL_TABLET | Freq: Three times a day (TID) | ORAL | 0 refills | Status: AC | PRN

## 2023-07-04 MED ORDER — HYDROCODONE-ACETAMINOPHEN 10-325 MG PO TABS: 1.0000 | ORAL_TABLET | Freq: Three times a day (TID) | ORAL | 0 refills | Status: DC | PRN

## 2023-07-04 NOTE — Progress Notes (Signed)
Subjective:    Patient ID: [name], female    DOB: [birth year], 63 y.o.   MRN: [id number]   Chief Complaint: pain management  HPI Pain assessment: Cause of pain- fibromyalgia Pain location- varies from day to day Pain on scale of 1-10- 8/10 Frequency- daily What increases pain- weather What makes pain Better-rest helps Effects on ADL - none Any change in general medical condition-none  Current opioids rx- norco 10/325 # meds rx- 90 Effectiveness of current meds-helps Adverse reactions from pain meds-none Morphine equivalent-  Pill count performed-No Last drug screen - n/a ( high risk q69m, moderate risk q25m, low risk yearly ) Urine drug screen today- Yes Was the NCCSR reviewed- yes  If yes were their any concerning findings? - no   Overdose risk: 1    08/12/2019    9:46 AM  Opioid Risk   Alcohol 0  Illegal Drugs 0  Rx Drugs 0  Alcohol 0  Illegal Drugs 0  Rx Drugs 0  Age between 16-45 years  0  History of Preadolescent Sexual Abuse 0  Psychological Disease 0  Depression 0  Opioid Risk Tool Scoring 0  Opioid Risk Interpretation Low Risk     Pain contract signed on: 01/07/23  Patient Active Problem List   Diagnosis Date Noted   Diet-controlled diabetes mellitus (HCC) 04/08/2023   S/P total left hip arthroplasty 03/28/2022   Primary hypertension 02/19/2021   Pain management contract agreement 05/16/2016   Insomnia 04/13/2015   BMI 26.0-26.9,adult 04/13/2015   Chronic nausea 10/12/2014   GAD (generalized anxiety disorder) 05/25/2014   Hyperlipidemia 11/04/2012   Migraines 11/04/2012   GERD (gastroesophageal reflux disease) 11/04/2012   Fibromyalgia 11/04/2012   TMJ arthropathy 11/04/2012        Review of Systems  Constitutional:  Negative for diaphoresis.  Eyes:  Negative for pain.  Respiratory:  Negative for shortness of breath.   Cardiovascular:  Negative for chest pain, palpitations and leg swelling.  Gastrointestinal:  Negative  for abdominal pain.  Endocrine: Negative for polydipsia.  Skin:  Negative for rash.  Neurological:  Negative for dizziness, weakness and headaches.  Hematological:  Does not bruise/bleed easily.  All other systems reviewed and are negative.      Objective:   Physical Exam Vitals and nursing note reviewed.  Constitutional:      General: She is not in acute distress.    Appearance: Normal appearance. She is well-developed.  Neck:     Vascular: No carotid bruit or JVD.  Cardiovascular:     Rate and Rhythm: Normal rate and regular rhythm.     Heart sounds: Normal heart sounds.  Pulmonary:     Effort: Pulmonary effort is normal. No respiratory distress.     Breath sounds: Normal breath sounds. No wheezing or rales.  Chest:     Chest wall: No tenderness.  Abdominal:     General: Bowel sounds are normal. There is no distension or abdominal bruit.     Palpations: Abdomen is soft. There is no hepatomegaly, splenomegaly, mass or pulsatile mass.     Tenderness: There is no abdominal tenderness.  Musculoskeletal:        General: Normal range of motion.     Cervical back: Normal range of motion and neck supple.  Lymphadenopathy:     Cervical: No cervical adenopathy.  Skin:    General: Skin is warm and dry.  Neurological:     Mental Status: She is alert and oriented to person,  place, and time.     Deep Tendon Reflexes: Reflexes are normal and symmetric.  Psychiatric:        Behavior: Behavior normal.        Thought Content: Thought content normal.        Judgment: Judgment normal.    BP 133/89   Pulse (!) 110   Temp (!) 97.2 F (36.2 C) (Temporal)   Ht 5\' 8"  (1.727 m)   Wt 193 lb (87.5 kg)   SpO2 97%   BMI 29.35 kg/m          Assessment & Plan:   [name] in today with chief complaint of pain management  1. Fibromyalgia 2. Pain management contract agreement Moist heat Stay warm   Meds ordered this encounter  Medications   HYDROcodone-acetaminophen  (NORCO) 10-325 MG tablet    Sig: Take 1 tablet by mouth every 8 (eight) hours as needed.    Dispense:  90 tablet    Refill:  0    Supervising Provider:   Arville Care A [[id number]]   HYDROcodone-acetaminophen (NORCO) 10-325 MG tablet    Sig: Take 1 tablet by mouth every 8 (eight) hours as needed.    Dispense:  90 tablet    Refill:  0    Supervising Provider:   Arville Care A [[id number]]   HYDROcodone-acetaminophen (NORCO) 10-325 MG tablet    Sig: Take 1 tablet by mouth every 8 (eight) hours as needed.    Dispense:  90 tablet    Refill:  0    Supervising Provider:   Arville Care A [[id number]]      The above assessment and management plan was discussed with the patient. The patient verbalized understanding of and has agreed to the management plan. Patient is aware to call the clinic if symptoms persist or worsen. Patient is aware when to return to the clinic for a follow-up visit. Patient educated on when it is appropriate to go to the emergency department.   Mary-Margaret Daphine Deutscher, FNP

## 2023-08-05 ENCOUNTER — Encounter: Payer: Self-pay | Admitting: *Deleted

## 2023-09-12 ENCOUNTER — Telehealth: Payer: Self-pay | Admitting: Nurse Practitioner

## 2023-09-22 ENCOUNTER — Ambulatory Visit: Payer: Medicare Other | Admitting: Nurse Practitioner

## 2023-09-27 ENCOUNTER — Other Ambulatory Visit: Payer: Self-pay | Admitting: Nurse Practitioner

## 2023-09-27 DIAGNOSIS — G43109 Migraine with aura, not intractable, without status migrainosus: Secondary | ICD-10-CM

## 2023-09-29 ENCOUNTER — Encounter: Payer: Self-pay | Admitting: Nurse Practitioner

## 2023-09-29 ENCOUNTER — Telehealth: Payer: Self-pay

## 2023-09-29 ENCOUNTER — Ambulatory Visit: Payer: Self-pay

## 2023-09-29 ENCOUNTER — Ambulatory Visit (INDEPENDENT_AMBULATORY_CARE_PROVIDER_SITE_OTHER): Payer: Medicare Other | Admitting: Nurse Practitioner

## 2023-09-29 ENCOUNTER — Other Ambulatory Visit (HOSPITAL_COMMUNITY): Payer: Self-pay

## 2023-09-29 VITALS — BP 155/76 | HR 82 | Temp 97.9°F | Ht 68.0 in | Wt 194.0 lb

## 2023-09-29 DIAGNOSIS — F411 Generalized anxiety disorder: Secondary | ICD-10-CM | POA: Diagnosis not present

## 2023-09-29 DIAGNOSIS — E119 Type 2 diabetes mellitus without complications: Secondary | ICD-10-CM | POA: Diagnosis not present

## 2023-09-29 DIAGNOSIS — Z7985 Long-term (current) use of injectable non-insulin antidiabetic drugs: Secondary | ICD-10-CM

## 2023-09-29 DIAGNOSIS — G43109 Migraine with aura, not intractable, without status migrainosus: Secondary | ICD-10-CM

## 2023-09-29 DIAGNOSIS — R600 Localized edema: Secondary | ICD-10-CM

## 2023-09-29 DIAGNOSIS — Z6826 Body mass index (BMI) 26.0-26.9, adult: Secondary | ICD-10-CM

## 2023-09-29 DIAGNOSIS — E7800 Pure hypercholesterolemia, unspecified: Secondary | ICD-10-CM | POA: Diagnosis not present

## 2023-09-29 DIAGNOSIS — F5101 Primary insomnia: Secondary | ICD-10-CM | POA: Diagnosis not present

## 2023-09-29 DIAGNOSIS — M797 Fibromyalgia: Secondary | ICD-10-CM | POA: Diagnosis not present

## 2023-09-29 LAB — LIPID PANEL

## 2023-09-29 MED ORDER — SAVELLA 50 MG PO TABS: 50.0000 mg | ORAL_TABLET | Freq: Two times a day (BID) | ORAL | 1 refills | Status: DC

## 2023-09-29 MED ORDER — SEMAGLUTIDE(0.25 OR 0.5MG/DOS) 2 MG/3ML ~~LOC~~ SOPN: 0.5000 mg | PEN_INJECTOR | SUBCUTANEOUS | 2 refills | Status: DC

## 2023-09-29 MED ORDER — POTASSIUM CHLORIDE CRYS ER 20 MEQ PO TBCR: 20.0000 meq | EXTENDED_RELEASE_TABLET | Freq: Every day | ORAL | 1 refills | Status: DC

## 2023-09-29 MED ORDER — KETOCONAZOLE 2 % EX CREA: 1.0000 | TOPICAL_CREAM | Freq: Every day | CUTANEOUS | 1 refills | Status: AC

## 2023-09-29 MED ORDER — TIZANIDINE HCL 4 MG PO TABS: ORAL_TABLET | ORAL | 1 refills | Status: DC

## 2023-09-29 MED ORDER — PRAVASTATIN SODIUM 40 MG PO TABS: 40.0000 mg | ORAL_TABLET | Freq: Every day | ORAL | 1 refills | Status: DC

## 2023-09-29 MED ORDER — SUMATRIPTAN SUCCINATE 100 MG PO TABS: ORAL_TABLET | ORAL | 2 refills | Status: DC

## 2023-09-29 MED ORDER — HYDROCODONE-ACETAMINOPHEN 10-325 MG PO TABS: 1.0000 | ORAL_TABLET | Freq: Three times a day (TID) | ORAL | 0 refills | Status: DC | PRN

## 2023-09-29 MED ORDER — CELECOXIB 200 MG PO CAPS: 200.0000 mg | ORAL_CAPSULE | Freq: Two times a day (BID) | ORAL | 1 refills | Status: DC

## 2023-09-29 MED ORDER — TORSEMIDE 40 MG PO TABS: 1.0000 | ORAL_TABLET | Freq: Once | ORAL | 1 refills | Status: DC

## 2023-09-29 NOTE — Patient Instructions (Signed)
 Semaglutide Injection What is this medication? SEMAGLUTIDE (SEM a GLOO tide) treats type 2 diabetes. It works by increasing insulin levels in your body, which decreases your blood sugar (glucose). It also reduces the amount of sugar released into your blood and slows down your digestion. It may also be used to lower the risk of heart attack and stroke in people with type 2 diabetes. Changes to diet and exercise are often combined with this medication. This medicine may be used for other purposes; ask your health care provider or pharmacist if you have questions. COMMON BRAND NAME(S): OZEMPIC What should I tell my care team before I take this medication? They need to know if you have any of these conditions: Eye disease caused by diabetes Gallbladder disease Have or have had pancreatitis Having surgery Kidney disease Personal or family history of MEN 2, a condition that causes endocrine gland tumors Personal or family history of thyroid cancer Stomach or intestine problems, such as problems digesting food An unusual or allergic reaction to semaglutide, other medications, foods, dyes, or preservatives Pregnant or trying to get pregnant Breastfeeding How should I use this medication? This medication is for injection under the skin of your upper leg (thigh), stomach area, or upper arm. It is given once every week (every 7 days). You will be taught how to prepare and give this medication. Use exactly as directed. Take your medication at regular intervals. Do not take it more often than directed. If you use this medication with insulin, you should inject this medication and the insulin separately. Do not mix them together. Do not give the injections right next to each other. Change (rotate) injection sites with each injection. It is important that you put your used needles and syringes in a special sharps container. Do not put them in a trash can. If you do not have a sharps container, call your  pharmacist or care team to get one. A special MedGuide will be given to you by the pharmacist with each prescription and refill. Be sure to read this information carefully each time. This medication comes with INSTRUCTIONS FOR USE. Ask your pharmacist for directions on how to use this medication. Read the information carefully. Talk to your pharmacist or care team if you have questions. Talk to your care team about the use of this medication in children. Special care may be needed. Overdosage: If you think you have taken too much of this medicine contact a poison control center or emergency room at once. NOTE: This medicine is only for you. Do not share this medicine with others. What if I miss a dose? If you miss a dose, take it as soon as you can within 5 days after the missed dose. Then take your next dose at your regular weekly time. If it has been longer than 5 days after the missed dose, do not take the missed dose. Take the next dose at your regular time. Do not take double or extra doses. If you have questions about a missed dose, contact your care team for advice. What may interact with this medication? Some medications may affect your blood sugar levels or hide the symptoms of low blood sugar (hypoglycemia). Talk with your care team about all the medications you take. They may suggest changes to your insulin dose or checking your blood sugar levels more often. Medications that may affect your blood sugar levels include: Alcohol Certain antibiotics, such as ciprofloxacin, levofloxacin, sulfamethoxazole; trimethoprim Certain medications for blood pressure or heart  disease, such as benazepril, enalapril, lisinopril, losartan, valsartan Certain medications for mental health conditions, such as fluoxetine or olanzapine Diuretics, such as hydrochlorothiazide (HCTZ) Estrogen and progestin hormones Other medications for diabetes Steroid medications, such as prednisone or  cortisone Testosterone Thyroid hormones Medications that may mask symptoms of low blood sugar include: Beta blockers, such as atenolol, metoprolol, propranolol Clonidine Guanethidine Reserpine This list may not describe all possible interactions. Give your health care provider a list of all the medicines, herbs, non-prescription drugs, or dietary supplements you use. Also tell them if you smoke, drink alcohol, or use illegal drugs. Some items may interact with your medicine. What should I watch for while using this medication? Visit your care team for regular checks on your progress. Tell your care team if your symptoms do not start to get better or if they get worse. You may need blood work done while you are taking this medication. Your care team will monitor your HbA1C (A1C). This test shows what your average blood sugar (glucose) level was over the past 2 to 3 months. Know the symptoms of low blood sugar and know how to treat it. Always carry a source of quick sugar with you. Examples include hard sugar candy or glucose tablets. Make sure others know that you can choke if you eat or drink if your blood sugar is too low and you are unable to care for yourself. Get medical help at once. Tell your care team if you have high blood sugar. Your medication dose may change if your body is under stress. Some types of stress that may affect your blood sugar include fever, infection, and surgery. Do not share pens or cartridges with anyone, even if the needle is changed. Each pen should only be used by one person. Sharing could cause an infection. Wear a medical ID bracelet or chain. Carry a card that describes your condition. List the medications and doses you take on the card. Talk to your care team about your risk of cancer. You may be more at risk for certain types of cancer if you take this medication. Talk to your care team right away if you have a lump or swelling in your neck, hoarseness that does  not go away, trouble swallowing, shortness of breath, or trouble breathing. Make sure you stay hydrated while taking this medication. Drink water often. Eat fruits and veggies that have a high water content. Drink more water when it is hot or you are active. Talk to your care team right away if you have fever, infection, vomiting, diarrhea, or if you sweat a lot while taking this medication. The loss of too much body fluid may make it dangerous for you to take this medication. If you are going to need surgery or a procedure, tell your care team that you are taking this medication. What side effects may I notice from receiving this medication? Side effects that you should report to your care team as soon as possible: Allergic reactions--skin rash, itching, hives, swelling of the face, lips, tongue, or throat Change in vision Dehydration--increased thirst, dry mouth, feeling faint or lightheaded, headache, dark yellow or brown urine Gallbladder problems--severe stomach pain, nausea, vomiting, fever Heart palpitations--rapid, pounding, or irregular heartbeat Kidney injury--decrease in the amount of urine, swelling of the ankles, hands, or feet Pancreatitis--severe stomach pain that spreads to your back or gets worse after eating or when touched, fever, nausea, vomiting Thoughts of suicide or self-harm, worsening mood, feelings of depression Thyroid cancer--new mass  or lump in the neck, pain or trouble swallowing, trouble breathing, hoarseness Side effects that usually do not require medical attention (report these to your care team if they continue or are bothersome): Diarrhea Loss of appetite Nausea Upset stomach This list may not describe all possible side effects. Call your doctor for medical advice about side effects. You may report side effects to FDA at 1-800-FDA-1088. Where should I keep my medication? Keep out of the reach of children. Store unopened pens in a refrigerator between 2 and 8  degrees C (36 and 46 degrees F). Do not freeze. Protect from light and heat. After you first use the pen, it can be stored for 56 days at room temperature between 15 and 30 degrees C (59 and 86 degrees F) or in a refrigerator. Throw away your used pen after 56 days or after the expiration date, whichever comes first. Do not store your pen with the needle attached. If the needle is left on, medication may leak from the pen. NOTE: This sheet is a summary. It may not cover all possible information. If you have questions about this medicine, talk to your doctor, pharmacist, or health care provider.  2024 Elsevier/Gold Standard (2023-05-09 00:00:00)

## 2023-09-29 NOTE — Telephone Encounter (Signed)
 Pharmacy Patient Advocate Encounter   Received notification from CoverMyMeds that prior authorization for Ozempic  (0.25 or 0.5 MG/DOSE) 2MG /3ML pen-injectors is required/requested.   Insurance verification completed.   The patient is insured through [facility] MEDICARE PARTD.   Per test claim: PA required; PA submitted to above mentioned insurance via CoverMyMeds Key/confirmation #/EOC (Key: [id number])      Status is pending

## 2023-09-29 NOTE — Progress Notes (Signed)
 Subjective:    Patient ID: [name], female    DOB: Jul 26, 1960, 63 y.o.   MRN: [id number]   Chief Complaint: medical management of chronic issues     HPI:  [name] is a 63 y.o. who identifies as a female who was assigned female at birth.   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 Primary hypertension No c/o chest pain, sob or headache. Does not check blood pressure at home. BP Readings from Last 3 Encounters:  09/29/23 (!) 155/76  07/04/23 133/89  04/28/23 (!) 149/89     2. Pure hypercholesterolemia Does try to watch diet and stay active. Does no dedicated exercise. Lab Results  Component Value Date   CHOL 172 04/07/2023   HDL 39 (L) 04/07/2023   LDLCALC 88 04/07/2023   TRIG 269 (H) 04/07/2023   CHOLHDL 4.4 04/07/2023     3. Gastroesophageal reflux disease, unspecified whether esophagitis present Is on protonix  and is doing well  4. Diet controlled diabetes Has been watching diet since dx. Blood sugars are running around 140-160. She watches diet and weight will not go down.   5. Fibromyalgia Takes savella  and pain meds daily. Pain assessment: Cause of pain- fibromyalgia Pain location- varies from day to day Pain on scale of 1-10- 10/10 today Frequency- daily What increases pain-nothing really What makes pain Better-rest Effects on ADL - none Any change in general medical condition-none  Current opioids rx- norco 10/325 TID # meds rx- 90 Effectiveness of current meds-helps Adverse reactions from pain meds-none Morphine  equivalent- 30 MME  Pill count performed-No Last drug screen - 01/02/23 ( high risk q59m, moderate risk q64m, low risk yearly ) Urine drug screen today- No Was the [facility] reviewed- yes  If yes were their any concerning findings? - no   Overdose risk: 1    08/12/2019    9:46 AM  Opioid Risk   Alcohol 0  Illegal  Drugs 0  Rx Drugs 0  Alcohol 0  Illegal Drugs 0  Rx Drugs 0  Age between 16-45 years  0  History of Preadolescent Sexual Abuse 0  Psychological Disease 0  Depression 0  Opioid Risk Tool Scoring 0  Opioid Risk Interpretation Low Risk     Pain contract signed on:01/07/23   6. GAD (generalized anxiety disorder) On no meds and is doing well    09/29/2023    3:57 PM 09/24/2022    8:11 AM 09/12/2022    3:39 PM 08/16/2022   11:05 AM  GAD 7 : Generalized Anxiety Score  Nervous, Anxious, on Edge 0 0 0 0  Control/stop worrying 0 0 0 0  Worry too much - different things 0 0 0 0  Trouble relaxing 0 0 0 0  Restless 0 0 0 0  Easily annoyed or irritable 0 1 1 0  Afraid - awful might happen 0 0 0 0  Total GAD 7 Score 0 1 1 0  Anxiety Difficulty Not difficult at all Not difficult at all Not difficult at all Not difficult at all      7. Primary insomnia On no sleep aids  8. BMI 26.0-26.9,adult No recent weight changes Wt Readings from Last 3 Encounters:  09/29/23 194 lb (88 kg)  07/04/23 193 lb (87.5 kg)  04/28/23 193 lb 12.8 oz (87.9 kg)   BMI Readings from Last 3 Encounters:  09/29/23 29.50 kg/m  07/04/23 29.35 kg/m  04/28/23 29.47 kg/m      New complaints:  - swelling of bil ands and feet. Seems to be worse in the mornings when she gets up.  Allergies  Allergen Reactions   Other Anaphylaxis and Itching    Plastic bottles; patient drank from a water  bottle and had to use Epi pen  Yeast infection from antibiotics   Peanuts [Peanut Oil] Anaphylaxis   Statins Other (See Comments)    Severe joint pain   Tape Other (See Comments)    Causes red blisters on skin. Can use paper tape   Outpatient Encounter Medications as of 09/29/2023  Medication Sig   celecoxib  (CELEBREX ) 200 MG capsule Take 1 capsule (200 mg total) by mouth 2 (two) times daily.   EPINEPHrine  0.3 mg/0.3 mL IJ SOAJ injection Inject 0.3 mLs (0.3 mg total) into the muscle once.   fluticasone  (FLONASE ) 50  MCG/ACT nasal spray Place 2 sprays into both nostrils daily.   HYDROcodone -acetaminophen  (NORCO) 10-325 MG tablet Take 1 tablet by mouth every 8 (eight) hours as needed.   meloxicam  (MOBIC ) 7.5 MG tablet TAKE ONE TABLET TWICE DAILY FOR 14 DAYS, THEN AS NEEDED   Milnacipran  (SAVELLA ) 50 MG TABS tablet Take 1 tablet (50 mg total) by mouth 2 (two) times daily.   Multiple Vitamin (MULTIVITAMIN) tablet Take 1 tablet by mouth daily.   ondansetron  (ZOFRAN  ODT) 4 MG disintegrating tablet Take 1 tablet (4 mg total) by mouth every 8 (eight) hours as needed for nausea or vomiting.   pantoprazole  (PROTONIX ) 40 MG tablet Take 1 tablet (40 mg total) by mouth 2 (two) times daily.   potassium chloride  SA (KLOR-CON  M) 20 MEQ tablet Take 1 tablet (20 mEq total) by mouth daily. For 5 days   pravastatin  (PRAVACHOL ) 40 MG tablet Take 1 tablet (40 mg total) by mouth daily.   SUMAtriptan  (IMITREX ) 100 MG tablet TAKE 1 TABLET AT ONSET OF HEADACHE;,MAY REPEAT ONCE IN 24 HOURS   tiZANidine  (ZANAFLEX ) 4 MG tablet TAKE ONE TABLET AT BEDTIME AS NEEDED FOR MUSCLE SPASMS   trimethoprim -polymyxin b  (POLYTRIM ) ophthalmic solution Place 2 drops into both eyes every 4 (four) hours.   sucralfate  (CARAFATE ) 1 g tablet Take 1 tablet (1 g total) by mouth 2 (two) times daily for 14 days.   Torsemide  40 MG TABS Take 1 tablet by mouth once for 1 dose.   No facility-administered encounter medications on file as of 09/29/2023.    Past Surgical History:  Procedure Laterality Date   ABDOMINAL HYSTERECTOMY     ADNOIDS     ANTERIOR CERVICAL DECOMP/DISCECTOMY FUSION N/A 10/27/2013   Procedure: ACDF/ANTERIOR CERVICAL DECOMPRESSION/DISCECTOMY FUSION  C5-C7  (2 LEVELS);  Surgeon: Mort Ards, MD;  Location: [facility] OR;  Service: Orthopedics;  Laterality: N/A;   BACK SURGERY     spinal   COLONOSCOPY  05/26/2020   EYE SURGERY Bilateral    cataract removal   KNEE ARTHROSCOPY Right    TOTAL HIP ARTHROPLASTY Left 03/28/2022   Procedure: TOTAL  HIP ARTHROPLASTY ANTERIOR APPROACH;  Surgeon: Claiborne Crew, MD;  Location: [facility] ORS;  Service: Orthopedics;  Laterality: Left;   TOTAL KNEE ARTHROPLASTY Right 06/30/2020   Procedure: TOTAL KNEE ARTHROPLASTY;  Surgeon: Winston Hawking, MD;  Location: [facility] OR[facility];  Service: Orthopedics;  Laterality: Right;   UPPER GASTROINTESTINAL ENDOSCOPY  05/26/2020    Family History  Problem Relation Age of Onset   Heart disease Mother    Cancer Father  STOMACH CANCER   Stomach cancer Father    Cancer Maternal Grandfather    Diabetes Paternal Grandmother    Rectal cancer Neg Hx    Esophageal cancer Neg Hx    Colon cancer Neg Hx       Controlled substance contract: n/a     Review of Systems  Constitutional:  Negative for diaphoresis.  Eyes:  Negative for pain.  Respiratory:  Negative for shortness of breath.   Cardiovascular:  Negative for chest pain, palpitations and leg swelling.  Gastrointestinal:  Negative for abdominal pain.  Endocrine: Negative for polydipsia.  Skin:  Negative for rash.  Neurological:  Negative for dizziness, weakness and headaches.  Hematological:  Does not bruise/bleed easily.  All other systems reviewed and are negative.      Objective:   Physical Exam Vitals and nursing note reviewed.  Constitutional:      General: She is not in acute distress.    Appearance: Normal appearance. She is well-developed.  HENT:     Head: Normocephalic.     Right Ear: Tympanic membrane normal.     Left Ear: Tympanic membrane normal.     Nose: Nose normal.     Mouth/Throat:     Mouth: Mucous membranes are moist.  Eyes:     Pupils: Pupils are equal, round, and reactive to light.  Neck:     Vascular: No carotid bruit or JVD.  Cardiovascular:     Rate and Rhythm: Normal rate and regular rhythm.     Heart sounds: Normal heart sounds.  Pulmonary:     Effort: Pulmonary effort is normal. No respiratory distress.     Breath sounds: Normal breath sounds. No wheezing or rales.   Chest:     Chest wall: No tenderness.  Abdominal:     General: Bowel sounds are normal. There is no distension or abdominal bruit.     Palpations: Abdomen is soft. There is no hepatomegaly, splenomegaly, mass or pulsatile mass.     Tenderness: There is no abdominal tenderness.  Musculoskeletal:        General: Normal range of motion.     Cervical back: Normal range of motion and neck supple.  Lymphadenopathy:     Cervical: No cervical adenopathy.  Skin:    General: Skin is warm and dry.  Neurological:     Mental Status: She is alert and oriented to person, place, and time.     Deep Tendon Reflexes: Reflexes are normal and symmetric.  Psychiatric:        Behavior: Behavior normal.        Thought Content: Thought content normal.        Judgment: Judgment normal.     BP (!) 155/76   Pulse 82   Temp 97.9 F (36.6 C) (Temporal)   Ht 5\' 8"  (1.727 m)   Wt 194 lb (88 kg)   SpO2 95%   BMI 29.50 kg/m   Hgba1c 6.8%      Assessment & Plan:  [name] comes in today with chief complaint of Medical Management of Chronic Issues   Diagnosis and orders addressed:  1. Primary hypertension Low sodium diet - CBC with Differential/Platelet - CMP14+EGFR  2. Pure hypercholesterolemia Low fat diet - Lipid panel - pravastatin  (PRAVACHOL ) 40 MG tablet; Take 1 tablet (40 mg total) by mouth daily.  Dispense: 90 tablet; Refill: 1  3. Gastroesophageal reflux disease, unspecified whether esophagitis present Avoid spicy foods Do not eat 2 hours prior to  bedtime - pantoprazole  (PROTONIX ) 40 MG tablet; Take 1 tablet (40 mg total) by mouth 2 (two) times daily.  Dispense: 180 tablet; Refill: 1  4. Diabetes mellitus with injecton of noninsuin medication  -ozempic  25mg  weekly for 2 weeks then 0.5mg  weekly  5. Fibromyalgia Keep legs warm - methylPREDNISolone  acetate (DEPO-MEDROL ) injection 80 mg - ketorolac  (TORADOL ) injection 60 mg - Milnacipran  (SAVELLA ) 50 MG TABS tablet; Take 1  tablet (50 mg total) by mouth 2 (two) times daily.  Dispense: 180 tablet; Refill: 1 - tiZANidine  (ZANAFLEX ) 4 MG tablet; TAKE ONE TABLET AT BEDTIME AS NEEDED FOR MUSCLE SPASMS  Dispense: 90 tablet; Refill: 1  6. GAD (generalized anxiety disorder) Stress management  7. Primary insomnia Bedtime   8. BMI 26.0-26.9,adult Discussed diet and exercise for person with BMI >25 Will recheck weight in 3-6 months   9. Peripheral edema Elevate legs when sitting - Torsemide  40 MG TABS; Take 1 tablet by mouth once for 1 dose.  Dispense: 90 tablet; Refill: 1   Labs pending Health Maintenance reviewed Diet and exercise encouraged  Follow up plan: 6 months   Mary-Margaret Gaylyn Keas, FNP

## 2023-09-29 NOTE — Addendum Note (Signed)
 Addended by: Katerra Ingman, MARY-MARGARET on: 09/29/2023 04:26 PM   Modules accepted: Orders

## 2023-09-29 NOTE — Telephone Encounter (Signed)
 Copied from CRM [id number]. Topic: Clinical - Medication Question >> Sep 29, 2023  5:46 PM Leory Rands wrote: Reason for CRM: [employer] Prior Authorization department is calling to clairify the sig Semaglutide ,0.25 or 0.5MG /DOS, 2 MG/3ML SOPN [[id number]]  Cb- [phone] option 2

## 2023-09-30 ENCOUNTER — Other Ambulatory Visit (HOSPITAL_COMMUNITY): Payer: Self-pay

## 2023-09-30 LAB — CMP14+EGFR
ALT: 46 IU/L — ABNORMAL HIGH (ref 0–32)
AST: 33 IU/L (ref 0–40)
Albumin: 4.4 g/dL (ref 3.9–4.9)
Alkaline Phosphatase: 102 IU/L (ref 44–121)
BUN/Creatinine Ratio: 16 (ref 12–28)
BUN: 12 mg/dL (ref 8–27)
CO2: 25 mmol/L (ref 20–29)
Calcium: 9.4 mg/dL (ref 8.7–10.3)
Chloride: 104 mmol/L (ref 96–106)
Creatinine, Ser: 0.77 mg/dL (ref 0.57–1.00)
Globulin, Total: 2.8 g/dL (ref 1.5–4.5)
Glucose: 124 mg/dL — ABNORMAL HIGH (ref 70–99)
Potassium: 4.1 mmol/L (ref 3.5–5.2)
Sodium: 143 mmol/L (ref 134–144)
Total Protein: 7.2 g/dL (ref 6.0–8.5)
eGFR: 86 mL/min/{1.73_m2} (ref >59)

## 2023-09-30 LAB — LIPID PANEL
Cholesterol, Total: 202 mg/dL — ABNORMAL HIGH (ref 100–199)
HDL: 34 mg/dL — ABNORMAL LOW (ref >39)
LDL CALC COMMENT:: 5.9 ratio — ABNORMAL HIGH (ref 0.0–4.4)
LDL Chol Calc (NIH): 109 mg/dL — ABNORMAL HIGH (ref 0–99)
Triglycerides: 340 mg/dL — ABNORMAL HIGH (ref 0–149)
VLDL Cholesterol Cal: 59 mg/dL — ABNORMAL HIGH (ref 5–40)

## 2023-09-30 LAB — CBC WITH DIFFERENTIAL/PLATELET
Basophils Absolute: 0 10*3/uL (ref 0.0–0.2)
Basos: 0 %
EOS (ABSOLUTE): 0.1 10*3/uL (ref 0.0–0.4)
Eos: 1 %
Hematocrit: 41.7 % (ref 34.0–46.6)
Hemoglobin: 13.8 g/dL (ref 11.1–15.9)
Immature Grans (Abs): 0 10*3/uL (ref 0.0–0.1)
Immature Granulocytes: 0 %
Lymphocytes Absolute: 2.3 10*3/uL (ref 0.7–3.1)
Lymphs: 28 %
MCH: 29.1 pg (ref 26.6–33.0)
MCHC: 33.1 g/dL (ref 31.5–35.7)
MCV: 88 fL (ref 79–97)
Monocytes Absolute: 0.8 10*3/uL (ref 0.1–0.9)
Monocytes: 9 %
Neutrophils Absolute: 5.2 10*3/uL (ref 1.4–7.0)
Neutrophils: 62 %
Platelets: 178 10*3/uL (ref 150–450)
RBC: 4.74 x10E6/uL (ref 3.77–5.28)
RDW: 12.6 % (ref 11.7–15.4)
WBC: 8.5 10*3/uL (ref 3.4–10.8)

## 2023-09-30 LAB — MICROALBUMIN / CREATININE URINE RATIO
Creatinine, Urine: 46.8 mg/dL
Microalb/Creat Ratio: <6 mg/g{creat} (ref 0–29)
Microalbumin, Urine: <3 ug/mL

## 2023-09-30 LAB — BAYER DCA HB A1C WAIVED: HB A1C (BAYER DCA - WAIVED): 7 % — ABNORMAL HIGH (ref 4.8–5.6)

## 2023-09-30 NOTE — Telephone Encounter (Signed)
 Nothing further is needed. I have contacted the pts pharmacy and the Rx is ready for pickup

## 2023-09-30 NOTE — Telephone Encounter (Signed)
 Pharmacy Patient Advocate Encounter  Received notification from [employer] that Prior Authorization for  Ozempic  (0.25 or 0.5 MG/DOSE) 2MG /3ML pen-injectors  has been APPROVED  to 12.31.25. Ran test claim, Copay is $47.00 for a month supply . This test claim was processed through [facility]- copay amounts may vary at other pharmacies due to pharmacy/plan contracts, or as the patient moves through the different stages of their insurance plan.   PA #/Case ID/Reference #: (Key: [id number])

## 2023-10-06 ENCOUNTER — Ambulatory Visit: Payer: 59 | Admitting: Nurse Practitioner

## 2023-11-06 ENCOUNTER — Ambulatory Visit (INDEPENDENT_AMBULATORY_CARE_PROVIDER_SITE_OTHER): Admitting: Nurse Practitioner

## 2023-11-06 ENCOUNTER — Encounter: Payer: Self-pay | Admitting: Nurse Practitioner

## 2023-11-06 VITALS — BP 146/83 | HR 93 | Temp 96.7°F | Ht 68.0 in | Wt 184.0 lb

## 2023-11-06 DIAGNOSIS — Z9889 Other specified postprocedural states: Secondary | ICD-10-CM | POA: Diagnosis not present

## 2023-11-06 DIAGNOSIS — R112 Nausea with vomiting, unspecified: Secondary | ICD-10-CM | POA: Diagnosis not present

## 2023-11-06 DIAGNOSIS — Z0001 Encounter for general adult medical examination with abnormal findings: Secondary | ICD-10-CM | POA: Diagnosis not present

## 2023-11-06 DIAGNOSIS — Z0000 Encounter for general adult medical examination without abnormal findings: Secondary | ICD-10-CM

## 2023-11-06 MED ORDER — ONDANSETRON 4 MG PO TBDP: 4.0000 mg | ORAL_TABLET | Freq: Three times a day (TID) | ORAL | 1 refills | Status: AC | PRN

## 2023-11-06 NOTE — Progress Notes (Signed)
 Subjective:    [name] is a 63 y.o. female who presents for a Welcome to Medicare exam.   Cardiac Risk Factors include: diabetes mellitus;advanced age (>79men, >30 women)      Objective:     Today's Vitals   11/06/23 1354 11/06/23 1355 11/06/23 1356  BP: (!) 146/83 (!) 146/83   Pulse: 93    Temp: (!) 96.7 F (35.9 C)    TempSrc: Temporal    SpO2: 94%    Weight: 184 lb (83.5 kg)    Height: 5\' 8"  (1.727 m)    PainSc:   6   Body mass index is 27.98 kg/m.  Medications Outpatient Encounter Medications as of 11/06/2023  Medication Sig   celecoxib  (CELEBREX ) 200 MG capsule Take 1 capsule (200 mg total) by mouth 2 (two) times daily.   EPINEPHrine  0.3 mg/0.3 mL IJ SOAJ injection Inject 0.3 mLs (0.3 mg total) into the muscle once.   fluticasone  (FLONASE ) 50 MCG/ACT nasal spray Place 2 sprays into both nostrils daily.   ketoconazole  (NIZORAL ) 2 % cream Apply 1 Application topically daily.   meloxicam  (MOBIC ) 7.5 MG tablet TAKE ONE TABLET TWICE DAILY FOR 14 DAYS, THEN AS NEEDED   Milnacipran  (SAVELLA ) 50 MG TABS tablet Take 1 tablet (50 mg total) by mouth 2 (two) times daily.   Multiple Vitamin (MULTIVITAMIN) tablet Take 1 tablet by mouth daily.   ondansetron  (ZOFRAN  ODT) 4 MG disintegrating tablet Take 1 tablet (4 mg total) by mouth every 8 (eight) hours as needed for nausea or vomiting.   pantoprazole  (PROTONIX ) 40 MG tablet Take 1 tablet (40 mg total) by mouth 2 (two) times daily.   potassium chloride  SA (KLOR-CON  M) 20 MEQ tablet Take 1 tablet (20 mEq total) by mouth daily. For 5 days   pravastatin  (PRAVACHOL ) 40 MG tablet Take 1 tablet (40 mg total) by mouth daily.   Semaglutide ,0.25 or 0.5MG /DOS, 2 MG/3ML SOPN Inject 0.5 mg into the skin once a week.   SUMAtriptan  (IMITREX ) 100 MG tablet TAKE 1 TABLET AT ONSET OF HEADACHE;,MAY REPEAT ONCE IN 24 HOURS   tiZANidine  (ZANAFLEX ) 4 MG tablet TAKE ONE TABLET AT BEDTIME AS NEEDED FOR MUSCLE SPASMS   sucralfate  (CARAFATE ) 1 g  tablet Take 1 tablet (1 g total) by mouth 2 (two) times daily for 14 days.   Torsemide  40 MG TABS Take 1 tablet by mouth once for 1 dose.   No facility-administered encounter medications on file as of 11/06/2023.     History: Past Medical History:  Diagnosis Date   Arthritis    right hand   Dyslipidemia    Family history of anesthesia complication    patients mother has severe nausea and vomiting after   Fibromyalgia    GERD (gastroesophageal reflux disease)    Guillain-Barre (HCC)    Guillain-Barre syndrome (HCC)    Hiatal hernia    History of kidney stones    Kidney stones    Migraines    one/month   Pneumonia    PONV (postoperative nausea and vomiting)    TMJ (dislocation of temporomandibular joint)    UTI (urinary tract infection)    hx of   Vitamin D deficiency    Past Surgical History:  Procedure Laterality Date   ABDOMINAL HYSTERECTOMY     ADNOIDS     ANTERIOR CERVICAL DECOMP/DISCECTOMY FUSION N/A 10/27/2013   Procedure: ACDF/ANTERIOR CERVICAL DECOMPRESSION/DISCECTOMY FUSION  C5-C7  (2 LEVELS);  Surgeon: Mort Ards, MD;  Location: [facility] OR;  Service:  Orthopedics;  Laterality: N/A;   BACK SURGERY     spinal   COLONOSCOPY  05/26/2020   EYE SURGERY Bilateral    cataract removal   KNEE ARTHROSCOPY Right    TOTAL HIP ARTHROPLASTY Left 03/28/2022   Procedure: TOTAL HIP ARTHROPLASTY ANTERIOR APPROACH;  Surgeon: Claiborne Crew, MD;  Location: [facility];  Service: Orthopedics;  Laterality: Left;   TOTAL KNEE ARTHROPLASTY Right 06/30/2020   Procedure: TOTAL KNEE ARTHROPLASTY;  Surgeon: Winston Hawking, MD;  Location: [facility];  Service: Orthopedics;  Laterality: Right;   UPPER GASTROINTESTINAL ENDOSCOPY  05/26/2020    Family History  Problem Relation Age of Onset   Heart disease Mother    Cancer Father        STOMACH CANCER   Stomach cancer Father    Cancer Maternal Grandfather    Diabetes Paternal Grandmother    Rectal cancer Neg Hx    Esophageal cancer Neg Hx    Colon  cancer Neg Hx    Social History   Occupational History   Not on file  Tobacco Use   Smoking status: Former    Current packs/day: 0.00    Average packs/day: 0.5 packs/day for 30.0 years (15.0 ttl pk-yrs)    Types: Cigarettes    Start date: 65    Quit date: 2010    Years since quitting: 15.4   Smokeless tobacco: Never  Vaping Use   Vaping status: Never Used  Substance and Sexual Activity   Alcohol use: Not Currently    Comment: rare   Drug use: No   Sexual activity: Not Currently    Tobacco Counseling Counseling given: Not Answered   Immunizations and Health Maintenance Immunization History  Administered Date(s) Administered   Tdap 05/12/2018   Health Maintenance Due  Topic Date Due   Medicare Annual Wellness (AWV)  Never done   FOOT EXAM  Never done   OPHTHALMOLOGY EXAM  Never done   HIV Screening  Never done   Zoster Vaccines- Shingrix (1 of 2) Never done   MAMMOGRAM  09/01/2020   DEXA SCAN  Never done    Activities of Daily Living    11/06/2023    1:59 PM  In your present state of health, do you have any difficulty performing the following activities:  Hearing? 0  Vision? 0  Difficulty concentrating or making decisions? 0  Walking or climbing stairs? 0  Dressing or bathing? 0  Doing errands, shopping? 0  Preparing Food and eating ? N  Using the Toilet? N  In the past six months, have you accidently leaked urine? N  Do you have problems with loss of bowel control? N  Managing your Medications? N  Managing your Finances? N  Housekeeping or managing your Housekeeping? N    Physical Exam   Physical Exam (optional), or other factors deemed appropriate based on the beneficiary's medical and social history and current clinical standards.   Advanced Directives: Does Patient Have a Medical Advance Directive?: Yes Type of Advance Directive: Healthcare Power of Attorney, Living will Copy of Healthcare Power of Attorney in Chart?: No - copy  requested  EKG:  normal EKG, normal sinus rhythm 4/24      Assessment:     This is a routine wellness examination for this patient . Welcome to medicare  Vision/Hearing screen No results found.   Goals      DIET - EAT MORE FRUITS AND VEGETABLES     Exercise 150 min/wk Moderate Activity  Depression Screen    11/06/2023    2:08 PM 09/29/2023    3:57 PM 07/04/2023    2:37 PM 09/24/2022    8:11 AM  PHQ 2/9 Scores  PHQ - 2 Score 0 0 0 0  PHQ- 9 Score    5     Fall Risk    11/06/2023    2:08 PM  Fall Risk   Falls in the past year? 0    Cognitive Function:        11/06/2023    2:08 PM  6CIT Screen  What Year? 0 points  What month? 0 points  What time? 0 points  Count back from 20 0 points  Months in reverse 0 points  Repeat phrase 2 points  Total Score 2 points    Patient Care Team: Delfina Feller, FNP as PCP - General (Nurse Practitioner)     Plan:   Welcome to Medicare  I have personally reviewed and noted the following in the patient's chart:   Medical and social history Use of alcohol, tobacco or illicit drugs  Current medications and supplements including opioid prescriptions. Patient is currently taking opioid prescriptions. Information provided to patient regarding non-opioid alternatives. Patient advised to discuss non-opioid treatment plan with their provider. Functional ability and status Nutritional status Physical activity Advanced directives List of other physicians Hospitalizations, surgeries, and ER visits in previous 12 months Vitals Screenings to include cognitive, depression, and falls Referrals and appointments  In addition, I have reviewed and discussed with patient certain preventive protocols, quality metrics, and best practice recommendations. A written personalized care plan for preventive services as well as general preventive health recommendations were provided to patient.     Mary-Margaret Gaylyn Keas,  [place] 11/06/2023

## 2023-11-06 NOTE — Addendum Note (Signed)
 Addended by: Tykera Skates, MARY-MARGARET on: 11/06/2023 02:28 PM   Modules accepted: Orders

## 2023-11-29 ENCOUNTER — Other Ambulatory Visit: Payer: Self-pay | Admitting: Nurse Practitioner

## 2024-01-01 ENCOUNTER — Other Ambulatory Visit: Payer: Self-pay | Admitting: Nurse Practitioner

## 2024-01-01 DIAGNOSIS — G43109 Migraine with aura, not intractable, without status migrainosus: Secondary | ICD-10-CM

## 2024-01-01 DIAGNOSIS — Z7985 Long-term (current) use of injectable non-insulin antidiabetic drugs: Secondary | ICD-10-CM

## 2024-01-05 ENCOUNTER — Ambulatory Visit: Admitting: Nurse Practitioner

## 2024-01-05 ENCOUNTER — Encounter: Payer: Self-pay | Admitting: Nurse Practitioner

## 2024-01-05 VITALS — BP 129/81 | HR 90 | Temp 97.7°F | Ht 68.0 in | Wt 178.0 lb

## 2024-01-05 DIAGNOSIS — R6889 Other general symptoms and signs: Secondary | ICD-10-CM | POA: Diagnosis not present

## 2024-01-05 DIAGNOSIS — R600 Localized edema: Secondary | ICD-10-CM

## 2024-01-05 DIAGNOSIS — G43109 Migraine with aura, not intractable, without status migrainosus: Secondary | ICD-10-CM | POA: Diagnosis not present

## 2024-01-05 DIAGNOSIS — I152 Hypertension secondary to endocrine disorders: Secondary | ICD-10-CM | POA: Diagnosis not present

## 2024-01-05 DIAGNOSIS — E1159 Type 2 diabetes mellitus with other circulatory complications: Secondary | ICD-10-CM | POA: Diagnosis not present

## 2024-01-05 DIAGNOSIS — E7800 Pure hypercholesterolemia, unspecified: Secondary | ICD-10-CM | POA: Diagnosis not present

## 2024-01-05 DIAGNOSIS — E119 Type 2 diabetes mellitus without complications: Secondary | ICD-10-CM | POA: Diagnosis not present

## 2024-01-05 DIAGNOSIS — Z0001 Encounter for general adult medical examination with abnormal findings: Secondary | ICD-10-CM | POA: Diagnosis not present

## 2024-01-05 DIAGNOSIS — F411 Generalized anxiety disorder: Secondary | ICD-10-CM

## 2024-01-05 DIAGNOSIS — F5101 Primary insomnia: Secondary | ICD-10-CM

## 2024-01-05 DIAGNOSIS — M797 Fibromyalgia: Secondary | ICD-10-CM | POA: Diagnosis not present

## 2024-01-05 DIAGNOSIS — Z0000 Encounter for general adult medical examination without abnormal findings: Secondary | ICD-10-CM

## 2024-01-05 DIAGNOSIS — K219 Gastro-esophageal reflux disease without esophagitis: Secondary | ICD-10-CM | POA: Diagnosis not present

## 2024-01-05 DIAGNOSIS — I10 Essential (primary) hypertension: Secondary | ICD-10-CM | POA: Diagnosis not present

## 2024-01-05 DIAGNOSIS — Z6826 Body mass index (BMI) 26.0-26.9, adult: Secondary | ICD-10-CM

## 2024-01-05 LAB — LIPID PANEL

## 2024-01-05 LAB — BAYER DCA HB A1C WAIVED: HB A1C (BAYER DCA - WAIVED): 5.9 % — ABNORMAL HIGH (ref 4.8–5.6)

## 2024-01-05 MED ORDER — TORSEMIDE 40 MG PO TABS: 1.0000 | ORAL_TABLET | Freq: Once | ORAL | 1 refills | Status: DC

## 2024-01-05 MED ORDER — SUMATRIPTAN SUCCINATE 100 MG PO TABS: ORAL_TABLET | ORAL | 5 refills | Status: AC

## 2024-01-05 MED ORDER — HYDROCODONE-ACETAMINOPHEN 10-325 MG PO TABS: 2.0000 | ORAL_TABLET | Freq: Four times a day (QID) | ORAL | 0 refills | Status: DC | PRN

## 2024-01-05 MED ORDER — CELECOXIB 200 MG PO CAPS: 200.0000 mg | ORAL_CAPSULE | Freq: Two times a day (BID) | ORAL | 1 refills | Status: DC

## 2024-01-05 MED ORDER — PRAVASTATIN SODIUM 40 MG PO TABS: 40.0000 mg | ORAL_TABLET | Freq: Every day | ORAL | 1 refills | Status: DC

## 2024-01-05 MED ORDER — TIZANIDINE HCL 4 MG PO TABS: ORAL_TABLET | ORAL | 1 refills | Status: DC

## 2024-01-05 MED ORDER — KETOROLAC TROMETHAMINE 60 MG/2ML IM SOLN
60.0000 mg | Freq: Once | INTRAMUSCULAR | Status: AC
  Administered 2024-01-05: 60 mg via INTRAMUSCULAR

## 2024-01-05 MED ORDER — PANTOPRAZOLE SODIUM 40 MG PO TBEC: 40.0000 mg | DELAYED_RELEASE_TABLET | Freq: Two times a day (BID) | ORAL | 1 refills | Status: DC

## 2024-01-05 MED ORDER — SAVELLA 50 MG PO TABS: 50.0000 mg | ORAL_TABLET | Freq: Two times a day (BID) | ORAL | 1 refills | Status: DC

## 2024-01-05 MED ORDER — SEMAGLUTIDE (1 MG/DOSE) 4 MG/3ML ~~LOC~~ SOPN: 1.0000 mg | PEN_INJECTOR | SUBCUTANEOUS | 3 refills | Status: DC

## 2024-01-05 NOTE — Patient Instructions (Signed)
Myofascial Pain Syndrome and Fibromyalgia Myofascial pain syndrome and fibromyalgia are both pain disorders. You may feel this pain mainly in your muscles. Myofascial pain syndrome: Always has tender points in the muscles that will cause pain when pressed (trigger points). The pain may come and go. Usually affects your neck, upper back, and shoulder areas. The pain often moves into your arms and hands. Fibromyalgia: Has muscle pains and tenderness that come and go. Is often associated with tiredness (fatigue) and sleep problems. Has trigger points. Tends to be long-lasting (chronic), but is not life-threatening. Fibromyalgia and myofascial pain syndrome are not the same. However, they often occur together. If you have both conditions, each can make the other worse. Both are common and can cause enough pain and fatigue to make day-to-day activities difficult. Both can be hard to diagnose because their symptoms are common in many other conditions. What are the causes? The exact causes of these conditions are not known. What increases the risk? You are more likely to develop either of these conditions if: You have a family history of the condition. You are female. You have certain triggers, such as: Spine disorders. An injury (trauma) or other physical stressors. Being under a lot of stress. Medical conditions such as osteoarthritis, rheumatoid arthritis, or lupus. What are the signs or symptoms? Fibromyalgia The main symptom of fibromyalgia is widespread pain and tenderness in your muscles. Pain is sometimes described as stabbing, shooting, or burning. You may also have: Tingling or numbness. Sleep problems and fatigue. Problems with attention and concentration (fibro fog). Other symptoms may include: Bowel and bladder problems. Headaches. Vision problems. Sensitivity to odors and noises. Depression or mood changes. Painful menstrual periods (dysmenorrhea). Dry skin or eyes. These  symptoms can vary over time. Myofascial pain syndrome Symptoms of myofascial pain syndrome include: Tight, ropy bands of muscle. Uncomfortable sensations in muscle areas. These may include aching, cramping, burning, numbness, tingling, and weakness. Difficulty moving certain parts of the body freely (poor range of motion). How is this diagnosed? This condition may be diagnosed by your symptoms and medical history. You will also have a physical exam. In general: Fibromyalgia is diagnosed if you have pain, fatigue, and other symptoms for more than 3 months, and symptoms cannot be explained by another condition. Myofascial pain syndrome is diagnosed if you have trigger points in your muscles, and those trigger points are tender and cause pain elsewhere in your body (referred pain). How is this treated? Treatment for these conditions depends on the type that you have. For fibromyalgia, a healthy lifestyle is the most important treatment including aerobic and strength exercises. Different types of medicines are used to help treat pain and include: NSAIDs. Medicines for treating depression. Medicines that help control seizures. Medicines that relax the muscles. Treatment for myofascial pain syndrome includes: Pain medicines, such as NSAIDs. Cooling and stretching of muscles. Massage therapy with myofascial release technique. Trigger point injections. Treating these conditions often requires a team of health care providers. These may include: Your primary care provider. A physical therapist. Complementary health care providers, such as massage therapists or acupuncturists. A psychiatrist for cognitive behavioral therapy. Follow these instructions at home: Medicines Take over-the-counter and prescription medicines only as told by your health care provider. Ask your health care provider if the medicine prescribed to you: Requires you to avoid driving or using machinery. Can cause constipation.  You may need to take these actions to prevent or treat constipation: Drink enough fluid to keep your urine pale   yellow. Take over-the-counter or prescription medicines. Eat foods that are high in fiber, such as beans, whole grains, and fresh fruits and vegetables. Limit foods that are high in fat and processed sugars, such as fried or sweet foods. Lifestyle  Do exercises as told by your health care provider or physical therapist. Practice relaxation techniques to control your stress. You may want to try: Biofeedback. Visual imagery. Hypnosis. Muscle relaxation. Yoga. Meditation. Maintain a healthy lifestyle. This includes eating a healthy diet and getting enough sleep. Do not use any products that contain nicotine or tobacco. These products include cigarettes, chewing tobacco, and vaping devices, such as e-cigarettes. If you need help quitting, ask your health care provider. General instructions Talk to your health care provider about complementary treatments, such as acupuncture or massage. Do not do activities that stress or strain your muscles. This includes repetitive motions and heavy lifting. Keep all follow-up visits. This is important. Where to find support Consider joining a support group with others who are diagnosed with this condition. National Fibromyalgia Association: fmaware.org Where to find more information [place] Pain Foundation: uspainfoundation.org Contact a health care provider if: You have new symptoms. Your symptoms get worse or your pain is severe. You have side effects from your medicines. You have trouble sleeping. Your condition is causing depression or anxiety. Get help right away if: You have thoughts of hurting yourself or others. Get help right away if you feel like you may hurt yourself or others, or have thoughts about taking your own life. Go to your nearest emergency room or: Call 911. Call the National Suicide Prevention Lifeline at 1-800-273-8255  or 988. This is open 24 hours a day. Text the Crisis Text Line at 741741. This information is not intended to replace advice given to you by your health care provider. Make sure you discuss any questions you have with your health care provider. Document Revised: 03/04/2022 Document Reviewed: 04/27/2021 Elsevier Patient Education  2024 [employer]vier Inc.  

## 2024-01-05 NOTE — Progress Notes (Signed)
 Subjective:    Patient ID: [name], female    DOB: 1961-05-30, 63 y.o.   MRN: [id number]   Chief Complaint: annual physical    HPI:  [name] is a 63 y.o. who identifies as a female who was assigned female at birth.   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 Primary hypertension No c/o chest pain, sob or headache. Does not check blood pressure at home. BP Readings from Last 3 Encounters:  11/06/23 (!) 146/83  09/29/23 (!) 155/76  07/04/23 133/89     2. Pure hypercholesterolemia Does try to watch diet and stay active. Does no dedicated exercise. Lab Results  Component Value Date   CHOL 202 (H) 09/29/2023   HDL 34 (L) 09/29/2023   LDLCALC 109 (H) 09/29/2023   TRIG 340 (H) 09/29/2023   CHOLHDL 5.9 (H) 09/29/2023     3. Gastroesophageal reflux disease, unspecified whether esophagitis present Is on protonix  and is doing well  4. Diet controlled diabetes Has been watching diet since dx. Blood sugars are running around 140-160. She watches diet and weight will not go down.   5. Fibromyalgia Takes savella  and pain meds daily. Pain assessment: Cause of pain- fibromyalgia Pain location- varies from day to day Pain on scale of 1-10- 10/10 today Frequency- daily What increases pain-nothing really What makes pain Better-rest Effects on ADL - none Any change in general medical condition-none  Current opioids rx- norco 10/325 TID prn # meds rx- 90 Effectiveness of current meds-helps Adverse reactions from pain meds-none Morphine  equivalent- 30 MME  Pill count performed-No Last drug screen - 01/02/23 ( high risk q27m, moderate risk q54m, low risk yearly ) Urine drug screen today- No Was the N[facility] reviewed- yes  If yes were their any concerning findings? - no   Overdose risk: 1    08/12/2019    9:46 AM  Opioid Risk   Alcohol 0   Illegal Drugs  0  Rx Drugs 0  Alcohol 0  Illegal Drugs 0  Rx Drugs 0  Age between 16-45 years  0  History of Preadolescent Sexual Abuse 0  Psychological Disease 0  Depression 0  Opioid Risk Tool Scoring 0  Opioid Risk Interpretation Low Risk     Data saved with a previous flowsheet row definition     Pain contract signed on:01/05/24   6. GAD (generalized anxiety disorder) On no meds and is doing well    09/29/2023    3:57 PM 09/24/2022    8:11 AM 09/12/2022    3:39 PM 08/16/2022   11:05 AM  GAD 7 : Generalized Anxiety Score  Nervous, Anxious, on Edge 0 0 0 0  Control/stop worrying 0 0 0 0  Worry too much - different things 0 0 0 0  Trouble relaxing 0 0 0 0  Restless 0 0 0 0  Easily annoyed or irritable 0 1 1 0  Afraid - awful might happen 0 0 0 0  Total GAD 7 Score 0 1 1 0  Anxiety Difficulty Not difficult at all Not difficult at all Not difficult at all Not difficult at all        7. Primary insomnia On no sleep aids  8. BMI 26.0-26.9,adult No recent weight changes  Wt Readings from Last 3 Encounters:  01/05/24 178 lb (80.7 kg)  11/06/23 184 lb (83.5 kg)  09/29/23 194 lb (88 kg)   BMI Readings from Last 3 Encounters:  01/05/24 27.06 kg/m  11/06/23 27.98 kg/m  09/29/23 29.50 kg/m       New complaints: None today  Allergies  Allergen Reactions   Other Anaphylaxis and Itching    Plastic bottles; patient drank from a water  bottle and had to use Epi pen  Yeast infection from antibiotics   Peanuts [Peanut Oil] Anaphylaxis   Statins Other (See Comments)    Severe joint pain   Tape Other (See Comments)    Causes red blisters on skin. Can use paper tape   Outpatient Encounter Medications as of 01/05/2024  Medication Sig   celecoxib  (CELEBREX ) 200 MG capsule Take 1 capsule (200 mg total) by mouth 2 (two) times daily.   EPINEPHrine  0.3 mg/0.3 mL IJ SOAJ injection Inject 0.3 mLs (0.3 mg total) into the muscle once.   fluticasone  (FLONASE ) 50 MCG/ACT nasal spray  Place 2 sprays into both nostrils daily.   HYDROcodone -acetaminophen  (NORCO) 10-325 MG tablet Take 2 tablets by mouth every 6 (six) hours as needed.   ketoconazole  (NIZORAL ) 2 % cream Apply 1 Application topically daily.   meloxicam  (MOBIC ) 7.5 MG tablet TAKE ONE TABLET TWICE DAILY FOR 14 DAYS, THEN AS NEEDED   Milnacipran  (SAVELLA ) 50 MG TABS tablet Take 1 tablet (50 mg total) by mouth 2 (two) times daily.   Multiple Vitamin (MULTIVITAMIN) tablet Take 1 tablet by mouth daily.   ondansetron  (ZOFRAN  ODT) 4 MG disintegrating tablet Take 1 tablet (4 mg total) by mouth every 8 (eight) hours as needed for nausea or vomiting.   OZEMPIC , 0.25 OR 0.5 MG/DOSE, 2 MG/3ML SOPN INJECT 0.5MG  ONCE WEEKLY   pantoprazole  (PROTONIX ) 40 MG tablet Take 1 tablet (40 mg total) by mouth 2 (two) times daily.   potassium chloride  SA (KLOR-CON  M) 20 MEQ tablet Take 1 tablet (20 mEq total) by mouth daily. For 5 days   pravastatin  (PRAVACHOL ) 40 MG tablet Take 1 tablet (40 mg total) by mouth daily.   sucralfate  (CARAFATE ) 1 g tablet Take 1 tablet (1 g total) by mouth 2 (two) times daily for 14 days.   SUMAtriptan  (IMITREX ) 100 MG tablet TAKE ONE TABLET AT ONSET OF MIGRAINE. MAY REPEAT ONCE IN 24 HOURS.   tiZANidine  (ZANAFLEX ) 4 MG tablet TAKE ONE TABLET AT BEDTIME AS NEEDED FOR MUSCLE SPASMS   Torsemide  40 MG TABS Take 1 tablet by mouth once for 1 dose.   No facility-administered encounter medications on file as of 01/05/2024.    Past Surgical History:  Procedure Laterality Date   ABDOMINAL HYSTERECTOMY     ADNOIDS     ANTERIOR CERVICAL DECOMP/DISCECTOMY FUSION N/A 10/27/2013   Procedure: ACDF/ANTERIOR CERVICAL DECOMPRESSION/DISCECTOMY FUSION  C5-C7  (2 LEVELS);  Surgeon: Donaciano Sprang, MD;  Location: [facility] OR;  Service: Orthopedics;  Laterality: N/A;   BACK SURGERY     spinal   COLONOSCOPY  05/26/2020   EYE SURGERY Bilateral    cataract removal   KNEE ARTHROSCOPY Right    TOTAL HIP ARTHROPLASTY Left 03/28/2022    Procedure: TOTAL HIP ARTHROPLASTY ANTERIOR APPROACH;  Surgeon: Ernie Cough, MD;  Location: [facility] ORS;  Service: Orthopedics;  Laterality: Left;   TOTAL KNEE ARTHROPLASTY Right 06/30/2020   Procedure: TOTAL KNEE ARTHROPLASTY;  Surgeon: Kay Kemps, MD;  Location: [facility] ORS;  Service: Orthopedics;  Laterality: Right;   UPPER GASTROINTESTINAL ENDOSCOPY  05/26/2020    Family History  Problem Relation Age of Onset   Heart disease Mother  Cancer Father        STOMACH CANCER   Stomach cancer Father    Cancer Maternal Grandfather    Diabetes Paternal Grandmother    Rectal cancer Neg Hx    Esophageal cancer Neg Hx    Colon cancer Neg Hx       Controlled substance contract: n/a     Review of Systems  Constitutional:  Negative for diaphoresis.  Eyes:  Negative for pain.  Respiratory:  Negative for shortness of breath.   Cardiovascular:  Negative for chest pain, palpitations and leg swelling.  Gastrointestinal:  Negative for abdominal pain.  Endocrine: Negative for polydipsia.  Skin:  Negative for rash.  Neurological:  Negative for dizziness, weakness and headaches.  Hematological:  Does not bruise/bleed easily.  All other systems reviewed and are negative.      Objective:   Physical Exam Vitals and nursing note reviewed.  Constitutional:      General: She is not in acute distress.    Appearance: Normal appearance. She is well-developed.  HENT:     Head: Normocephalic.     Right Ear: Tympanic membrane normal.     Left Ear: Tympanic membrane normal.     Nose: Nose normal.     Mouth/Throat:     Mouth: Mucous membranes are moist.  Eyes:     Pupils: Pupils are equal, round, and reactive to light.  Neck:     Vascular: No carotid bruit or JVD.  Cardiovascular:     Rate and Rhythm: Normal rate and regular rhythm.     Heart sounds: Normal heart sounds.  Pulmonary:     Effort: Pulmonary effort is normal. No respiratory distress.     Breath sounds: Normal breath sounds. No  wheezing or rales.  Chest:     Chest wall: No tenderness.  Abdominal:     General: Bowel sounds are normal. There is no distension or abdominal bruit.     Palpations: Abdomen is soft. There is no hepatomegaly, splenomegaly, mass or pulsatile mass.     Tenderness: There is no abdominal tenderness.  Musculoskeletal:        General: Normal range of motion.     Cervical back: Normal range of motion and neck supple.  Lymphadenopathy:     Cervical: No cervical adenopathy.  Skin:    General: Skin is warm and dry.  Neurological:     Mental Status: She is alert and oriented to person, place, and time.     Deep Tendon Reflexes: Reflexes are normal and symmetric.  Psychiatric:        Behavior: Behavior normal.        Thought Content: Thought content normal.        Judgment: Judgment normal.     BP 129/81   Pulse 90   Temp 97.7 F (36.5 C) (Temporal)   Ht 5' 8 (1.727 m)   Wt 178 lb (80.7 kg)   SpO2 97%   BMI 27.06 kg/m    Hgba1c 5.9%      Assessment & Plan:  [name] comes in today with chief complaint of annual physical   Diagnosis and orders addressed:  1. Primary hypertension Low sodium diet - CBC with Differential/Platelet - CMP14+EGFR  2. Pure hypercholesterolemia Low fat diet - Lipid panel - pravastatin  (PRAVACHOL ) 40 MG tablet; Take 1 tablet (40 mg total) by mouth daily.  Dispense: 90 tablet; Refill: 1  3. Gastroesophageal reflux disease, unspecified whether esophagitis present Avoid spicy foods Do  not eat 2 hours prior to bedtime - pantoprazole  (PROTONIX ) 40 MG tablet; Take 1 tablet (40 mg total) by mouth 2 (two) times daily.  Dispense: 180 tablet; Refill: 1  4. Diabetes mellitus with injecton of noninsuin medication -increased to ozempic  1mg  weekly   5. Fibromyalgia Keep legs warm - methylPREDNISolone  acetate (DEPO-MEDROL ) injection 80 mg - ketorolac  (TORADOL ) injection 60 mg - Milnacipran  (SAVELLA ) 50 MG TABS tablet; Take 1 tablet (50 mg total)  by mouth 2 (two) times daily.  Dispense: 180 tablet; Refill: 1 - tiZANidine  (ZANAFLEX ) 4 MG tablet; TAKE ONE TABLET AT BEDTIME AS NEEDED FOR MUSCLE SPASMS  Dispense: 90 tablet; Refill: 1  6. GAD (generalized anxiety disorder) Stress management  7. Primary insomnia Bedtime   8. BMI 26.0-26.9,adult Discussed diet and exercise for person with BMI >25 Will recheck weight in 3-6 months   9. Peripheral edema Elevate legs when sitting - Torsemide  40 MG TABS; Take 1 tablet by mouth once for 1 dose.  Dispense: 90 tablet; Refill: 1   Labs pending Health Maintenance reviewed Diet and exercise encouraged  Follow up plan: 6 months   Mary-Margaret Gladis, FNP

## 2024-01-06 ENCOUNTER — Ambulatory Visit: Payer: Self-pay | Admitting: Nurse Practitioner

## 2024-01-06 LAB — LIPID PANEL
Cholesterol, Total: 120 mg/dL (ref 100–199)
HDL: 39 mg/dL — AB (ref >39)
LDL CALC COMMENT:: 3.1 ratio (ref 0.0–4.4)
LDL Chol Calc (NIH): 54 mg/dL (ref 0–99)
Triglycerides: 155 mg/dL — AB (ref 0–149)
VLDL Cholesterol Cal: 27 mg/dL (ref 5–40)

## 2024-01-06 LAB — CMP14+EGFR
ALT: 31 IU/L (ref 0–32)
AST: 30 IU/L (ref 0–40)
Albumin: 4.4 g/dL (ref 3.9–4.9)
Alkaline Phosphatase: 95 IU/L (ref 44–121)
BUN/Creatinine Ratio: 21 (ref 12–28)
BUN: 15 mg/dL (ref 8–27)
Bilirubin Total: 0.2 mg/dL (ref 0.0–1.2)
CO2: 22 mmol/L (ref 20–29)
Calcium: 9.5 mg/dL (ref 8.7–10.3)
Chloride: 104 mmol/L (ref 96–106)
Creatinine, Ser: 0.72 mg/dL (ref 0.57–1.00)
Globulin, Total: 2.4 g/dL (ref 1.5–4.5)
Glucose: 148 mg/dL — AB (ref 70–99)
Potassium: 3.9 mmol/L (ref 3.5–5.2)
Sodium: 141 mmol/L (ref 134–144)
Total Protein: 6.8 g/dL (ref 6.0–8.5)
eGFR: 93 mL/min/1.73 (ref >59)

## 2024-01-06 LAB — CBC WITH DIFFERENTIAL/PLATELET
Basophils Absolute: 0 x10E3/uL (ref 0.0–0.2)
Basos: 0 %
EOS (ABSOLUTE): 0.1 x10E3/uL (ref 0.0–0.4)
Eos: 1 %
Hematocrit: 44.2 % (ref 34.0–46.6)
Hemoglobin: 14.4 g/dL (ref 11.1–15.9)
Immature Grans (Abs): 0 x10E3/uL (ref 0.0–0.1)
Immature Granulocytes: 0 %
Lymphocytes Absolute: 1.9 x10E3/uL (ref 0.7–3.1)
Lymphs: 30 %
MCH: 29.6 pg (ref 26.6–33.0)
MCHC: 32.6 g/dL (ref 31.5–35.7)
MCV: 91 fL (ref 79–97)
Monocytes Absolute: 0.6 x10E3/uL (ref 0.1–0.9)
Monocytes: 9 %
Neutrophils Absolute: 3.9 x10E3/uL (ref 1.4–7.0)
Neutrophils: 60 %
Platelets: 181 x10E3/uL (ref 150–450)
RBC: 4.87 x10E6/uL (ref 3.77–5.28)
RDW: 13.3 % (ref 11.7–15.4)
WBC: 6.5 x10E3/uL (ref 3.4–10.8)

## 2024-01-06 LAB — VITAMIN D 25 HYDROXY (VIT D DEFICIENCY, FRACTURES): Vit D, 25-Hydroxy: 104 ng/mL — AB (ref 30.0–100.0)

## 2024-01-06 LAB — THYROID PANEL WITH TSH
Free Thyroxine Index: 1.5 (ref 1.2–4.9)
T3 Uptake Ratio: 22 — AB (ref 24–39)
T4, Total: 6.6 ug/dL (ref 4.5–12.0)
TSH: 2.37 u[IU]/mL (ref 0.450–4.500)

## 2024-01-07 LAB — TOXASSURE SELECT 13 (MW), URINE

## 2024-01-08 ENCOUNTER — Telehealth: Payer: Self-pay | Admitting: *Deleted

## 2024-01-08 MED ORDER — HYDROCODONE-ACETAMINOPHEN 10-325 MG PO TABS: 1.0000 | ORAL_TABLET | Freq: Three times a day (TID) | ORAL | 0 refills | Status: AC | PRN

## 2024-01-08 MED ORDER — HYDROCODONE-ACETAMINOPHEN 10-325 MG PO TABS: 1.0000 | ORAL_TABLET | Freq: Three times a day (TID) | ORAL | 0 refills | Status: DC | PRN

## 2024-01-08 NOTE — Addendum Note (Signed)
 Addended by: Lydell Moga, MARY-MARGARET on: 01/08/2024 01:52 PM   Modules accepted: Orders

## 2024-01-08 NOTE — Telephone Encounter (Signed)
 TC from [facility] pharmacy RE: Hydrocodone -acetaminophen  10-325 Is this an increase? Pt hs been taking 1 Q 8, written for 2 Q 6 Qty #90 does not match directions Please advise, if new directions please update qty and resend all 3 scripts

## 2024-03-22 ENCOUNTER — Other Ambulatory Visit: Payer: Self-pay | Admitting: Nurse Practitioner

## 2024-03-22 DIAGNOSIS — Z1231 Encounter for screening mammogram for malignant neoplasm of breast: Secondary | ICD-10-CM

## 2024-03-29 ENCOUNTER — Ambulatory Visit: Admitting: Nurse Practitioner

## 2024-03-29 ENCOUNTER — Encounter: Payer: Self-pay | Admitting: Nurse Practitioner

## 2024-03-29 ENCOUNTER — Other Ambulatory Visit

## 2024-03-29 DIAGNOSIS — R600 Localized edema: Secondary | ICD-10-CM

## 2024-03-29 DIAGNOSIS — K219 Gastro-esophageal reflux disease without esophagitis: Secondary | ICD-10-CM

## 2024-03-29 DIAGNOSIS — E7800 Pure hypercholesterolemia, unspecified: Secondary | ICD-10-CM | POA: Diagnosis not present

## 2024-03-29 DIAGNOSIS — M797 Fibromyalgia: Secondary | ICD-10-CM

## 2024-03-29 MED ORDER — PANTOPRAZOLE SODIUM 40 MG PO TBEC: 40.0000 mg | DELAYED_RELEASE_TABLET | Freq: Two times a day (BID) | ORAL | 1 refills | Status: AC

## 2024-03-29 MED ORDER — HYDROCODONE-ACETAMINOPHEN 10-325 MG PO TABS: 1.0000 | ORAL_TABLET | Freq: Three times a day (TID) | ORAL | 0 refills | Status: DC | PRN

## 2024-03-29 MED ORDER — HYDROCODONE-ACETAMINOPHEN 10-325 MG PO TABS: 1.0000 | ORAL_TABLET | Freq: Three times a day (TID) | ORAL | 0 refills | Status: AC | PRN

## 2024-03-29 MED ORDER — SAVELLA 50 MG PO TABS: 50.0000 mg | ORAL_TABLET | Freq: Two times a day (BID) | ORAL | 1 refills | Status: DC

## 2024-03-29 MED ORDER — KETOROLAC TROMETHAMINE 60 MG/2ML IM SOLN
60.0000 mg | Freq: Once | INTRAMUSCULAR | Status: AC
  Administered 2024-03-29: 60 mg via INTRAMUSCULAR

## 2024-03-29 MED ORDER — METHYLPREDNISOLONE ACETATE 80 MG/ML IJ SUSP
80.0000 mg | Freq: Once | INTRAMUSCULAR | Status: AC
  Administered 2024-03-29: 80 mg via INTRAMUSCULAR

## 2024-03-29 MED ORDER — PREDNISONE 20 MG PO TABS: ORAL_TABLET | ORAL | 0 refills | Status: DC

## 2024-03-29 MED ORDER — TIZANIDINE HCL 4 MG PO TABS: ORAL_TABLET | ORAL | 1 refills | Status: AC

## 2024-03-29 MED ORDER — TORSEMIDE 40 MG PO TABS: 1.0000 | ORAL_TABLET | Freq: Once | ORAL | 1 refills | Status: DC

## 2024-03-29 MED ORDER — PRAVASTATIN SODIUM 40 MG PO TABS: 40.0000 mg | ORAL_TABLET | Freq: Every day | ORAL | 1 refills | Status: AC

## 2024-03-29 NOTE — Progress Notes (Signed)
 Subjective:    Patient ID: [name], female    DOB: 04-29-1961, 63 y.o.   MRN: [id number]   Chief Complaint: medical management of chronic issues     HPI:  [name] is a 63 y.o. who identifies as a female who was assigned female at birth.   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 Primary hypertension No c/o chest pain, sob or headache. Does not check blood pressure at home. BP Readings from Last 3 Encounters:  03/29/24 137/78  01/05/24 129/81  11/06/23 (!) 146/83     2. Pure hypercholesterolemia Does try to watch diet and stay active. Does no dedicated exercise. Lab Results  Component Value Date   CHOL 120 01/05/2024   HDL 39 (L) 01/05/2024   LDLCALC 54 01/05/2024   TRIG 155 (H) 01/05/2024   CHOLHDL 3.1 01/05/2024     3. Gastroesophageal reflux disease, unspecified whether esophagitis present Is on protonix  and is doing well  4. Diet controlled diabetes Has been watching diet since dx. Blood sugars are running around 140-160. She watches diet and weight will not go down.   5. Fibromyalgia Takes savella  and pain meds daily. Pain assessment: Cause of pain- fibromyalgia Pain location- varies from day to day Pain on scale of 1-10- 10/10 today Frequency- daily What increases pain-nothing really What makes pain Better-rest Effects on ADL - none Any change in general medical condition-none  Current opioids rx- norco 10/325 TID # meds rx- 90 Effectiveness of current meds-helps Adverse reactions from pain meds-none Morphine  equivalent- 30 MME  Pill count performed-No Last drug screen - 01/02/23 ( high risk q71m, moderate risk q64m, low risk yearly ) Urine drug screen today- No Was the [facility] reviewed- yes  If yes were their any concerning findings? - no   Overdose risk: 1    08/12/2019    9:46 AM  Opioid Risk   Alcohol 0   Illegal  Drugs 0  Rx Drugs 0  Alcohol 0  Illegal Drugs 0  Rx Drugs 0  Age between 16-45 years  0  History of Preadolescent Sexual Abuse 0  Psychological Disease 0  Depression 0  Opioid Risk Tool Scoring 0  Opioid Risk Interpretation Low Risk     Data saved with a previous flowsheet row definition     Pain contract signed on:01/07/23   6. GAD (generalized anxiety disorder) On no meds and is doing well    03/29/2024    2:13 PM 09/29/2023    3:57 PM 09/24/2022    8:11 AM 09/12/2022    3:39 PM  GAD 7 : Generalized Anxiety Score  Nervous, Anxious, on Edge 0 0 0 0  Control/stop worrying 0 0 0 0  Worry too much - different things 0 0 0 0  Trouble relaxing 0 0 0 0  Restless 0 0 0 0  Easily annoyed or irritable 0 0 1 1  Afraid - awful might happen 0 0 0 0  Total GAD 7 Score 0 0 1 1  Anxiety Difficulty Not difficult at all Not difficult at all Not difficult at all Not difficult at all      7. Primary insomnia On no sleep aids  8. BMI 26.0-26.9,adult Weight is down 3 lbs. Is on wegovy  1mg  weekly Wt Readings from Last 3 Encounters:  03/29/24 175 lb (79.4 kg)  01/05/24 178  lb (80.7 kg)  11/06/23 184 lb (83.5 kg)   BMI Readings from Last 3 Encounters:  03/29/24 26.61 kg/m  01/05/24 27.06 kg/m  11/06/23 27.98 kg/m      New complaints: None today  Allergies  Allergen Reactions   Other Anaphylaxis and Itching    Plastic bottles; patient drank from a water  bottle and had to use Epi pen  Yeast infection from antibiotics   Peanuts [Peanut Oil] Anaphylaxis   Statins Other (See Comments)    Severe joint pain   Tape Other (See Comments)    Causes red blisters on skin. Can use paper tape   Outpatient Encounter Medications as of 03/29/2024  Medication Sig   EPINEPHrine  0.3 mg/0.3 mL IJ SOAJ injection Inject 0.3 mLs (0.3 mg total) into the muscle once.   fluticasone  (FLONASE ) 50 MCG/ACT nasal spray Place 2 sprays into both nostrils daily.   HYDROcodone -acetaminophen  (NORCO)  10-325 MG tablet Take 1 tablet by mouth 3 (three) times daily as needed.   ketoconazole  (NIZORAL ) 2 % cream Apply 1 Application topically daily.   meloxicam  (MOBIC ) 7.5 MG tablet TAKE ONE TABLET TWICE DAILY FOR 14 DAYS, THEN AS NEEDED   Milnacipran  (SAVELLA ) 50 MG TABS tablet Take 1 tablet (50 mg total) by mouth 2 (two) times daily.   Multiple Vitamin (MULTIVITAMIN) tablet Take 1 tablet by mouth daily.   ondansetron  (ZOFRAN  ODT) 4 MG disintegrating tablet Take 1 tablet (4 mg total) by mouth every 8 (eight) hours as needed for nausea or vomiting.   pantoprazole  (PROTONIX ) 40 MG tablet Take 1 tablet (40 mg total) by mouth 2 (two) times daily.   pravastatin  (PRAVACHOL ) 40 MG tablet Take 1 tablet (40 mg total) by mouth daily.   Semaglutide , 1 MG/DOSE, 4 MG/3ML SOPN Inject 1 mg as directed once a week.   sucralfate  (CARAFATE ) 1 g tablet Take 1 tablet (1 g total) by mouth 2 (two) times daily for 14 days.   SUMAtriptan  (IMITREX ) 100 MG tablet TAKE ONE TABLET AT ONSET OF MIGRAINE. MAY REPEAT ONCE IN 24 HOURS.   tiZANidine  (ZANAFLEX ) 4 MG tablet TAKE ONE TABLET AT BEDTIME AS NEEDED FOR MUSCLE SPASMS   Torsemide  40 MG TABS Take 1 tablet by mouth once for 1 dose.   No facility-administered encounter medications on file as of 03/29/2024.    Past Surgical History:  Procedure Laterality Date   ABDOMINAL HYSTERECTOMY     ADNOIDS     ANTERIOR CERVICAL DECOMP/DISCECTOMY FUSION N/A 10/27/2013   Procedure: ACDF/ANTERIOR CERVICAL DECOMPRESSION/DISCECTOMY FUSION  C5-C7  (2 LEVELS);  Surgeon: Donaciano Sprang, MD;  Location: [facility] OR;  Service: Orthopedics;  Laterality: N/A;   BACK SURGERY     spinal   COLONOSCOPY  05/26/2020   EYE SURGERY Bilateral    cataract removal   KNEE ARTHROSCOPY Right    TOTAL HIP ARTHROPLASTY Left 03/28/2022   Procedure: TOTAL HIP ARTHROPLASTY ANTERIOR APPROACH;  Surgeon: Ernie Cough, MD;  Location: [facility] ORS;  Service: Orthopedics;  Laterality: Left;   TOTAL KNEE ARTHROPLASTY Right  06/30/2020   Procedure: TOTAL KNEE ARTHROPLASTY;  Surgeon: Kay Kemps, MD;  Location: [facility] ORS;  Service: Orthopedics;  Laterality: Right;   UPPER GASTROINTESTINAL ENDOSCOPY  05/26/2020    Family History  Problem Relation Age of Onset   Heart disease Mother    Cancer Father        STOMACH CANCER   Stomach cancer Father    Cancer Maternal Grandfather    Diabetes Paternal Grandmother    Rectal cancer  Neg Hx    Esophageal cancer Neg Hx    Colon cancer Neg Hx       Controlled substance contract: n/a     Review of Systems  Constitutional:  Negative for diaphoresis.  Eyes:  Negative for pain.  Respiratory:  Negative for shortness of breath.   Cardiovascular:  Negative for chest pain, palpitations and leg swelling.  Gastrointestinal:  Negative for abdominal pain.  Endocrine: Negative for polydipsia.  Skin:  Negative for rash.  Neurological:  Negative for dizziness, weakness and headaches.  Hematological:  Does not bruise/bleed easily.  All other systems reviewed and are negative.      Objective:   Physical Exam Vitals and nursing note reviewed.  Constitutional:      General: She is not in acute distress.    Appearance: Normal appearance. She is well-developed.  HENT:     Head: Normocephalic.     Right Ear: Tympanic membrane normal.     Left Ear: Tympanic membrane normal.     Nose: Nose normal.     Mouth/Throat:     Mouth: Mucous membranes are moist.  Eyes:     Pupils: Pupils are equal, round, and reactive to light.  Neck:     Vascular: No carotid bruit or JVD.  Cardiovascular:     Rate and Rhythm: Normal rate and regular rhythm.     Heart sounds: Normal heart sounds.  Pulmonary:     Effort: Pulmonary effort is normal. No respiratory distress.     Breath sounds: Normal breath sounds. No wheezing or rales.  Chest:     Chest wall: No tenderness.  Abdominal:     General: Bowel sounds are normal. There is no distension or abdominal bruit.     Palpations: Abdomen  is soft. There is no hepatomegaly, splenomegaly, mass or pulsatile mass.     Tenderness: There is no abdominal tenderness.  Musculoskeletal:        General: Normal range of motion.     Cervical back: Normal range of motion and neck supple.  Lymphadenopathy:     Cervical: No cervical adenopathy.  Skin:    General: Skin is warm and dry.  Neurological:     Mental Status: She is alert and oriented to person, place, and time.     Deep Tendon Reflexes: Reflexes are normal and symmetric.  Psychiatric:        Behavior: Behavior normal.        Thought Content: Thought content normal.        Judgment: Judgment normal.     BP 137/78   Pulse 85   Temp 98.1 F (36.7 C) (Temporal)   Ht 5' 8 (1.727 m)   Wt 175 lb (79.4 kg)   SpO2 97%   BMI 26.61 kg/m   Hgba1c 6.8%      Assessment & Plan:  EDLIN FORD comes in today with chief complaint of Pain Management   Diagnosis and orders addressed:  1. Fibromyalgia Moist heat Stay active - Milnacipran  (SAVELLA ) 50 MG TABS tablet; Take 1 tablet (50 mg total) by mouth 2 (two) times daily.  Dispense: 180 tablet; Refill: 1 - tiZANidine  (ZANAFLEX ) 4 MG tablet; TAKE ONE TABLET AT BEDTIME AS NEEDED FOR MUSCLE SPASMS  Dispense: 90 tablet; Refill: 1 - predniSONE  (DELTASONE ) 20 MG tablet; 2 po at sametime daily for 5 days-  Dispense: 10 tablet; Refill: 0 - methylPREDNISolone  acetate (DEPO-MEDROL ) injection 80 mg - ketorolac  (TORADOL ) injection 60 mg  2. Gastroesophageal reflux  disease, unspecified whether esophagitis present Avoid spicy foods Do not eat 2 hours prior to bedtime - pantoprazole  (PROTONIX ) 40 MG tablet; Take 1 tablet (40 mg total) by mouth 2 (two) times daily.  Dispense: 180 tablet; Refill: 1  3. Peripheral edema Elevate legs when sitting - Torsemide  40 MG TABS; Take 1 tablet by mouth once for 1 dose.  Dispense: 90 tablet; Refill: 1  4. Pure hypercholesterolemia Low fat diet - pravastatin  (PRAVACHOL ) 40 MG tablet; Take 1  tablet (40 mg total) by mouth daily.  Dispense: 90 tablet; Refill: 1   Labs pending Health Maintenance reviewed Diet and exercise encouraged  Follow up plan: 3 months   Mary-Margaret Gladis, FNP

## 2024-03-29 NOTE — Patient Instructions (Signed)
 Muscle Pain, Adult Muscle pain, also called myalgia, is a condition in which a person has pain in one or more muscles in the body. The pain may be mild, moderate, or severe. It may feel sharp, achy, or burning. In most cases, the pain lasts only a short time and goes away on its own. It is normal to feel some muscle pain after you start a new exercise program. Muscles that have not been used a lot will be sore at first. What are the causes? You may have muscle pain when you use your muscles in a new or different way after not having used them for some time. Muscle pain can also be caused by overuse or by stretching a muscle beyond its normal length (muscle strain). You may be more likely to have muscle pain if you are not in shape. Other causes may include: Injury or bruising. Infectious diseases. These include diseases caused by viruses, such as the flu (influenza). Fibromyalgia. This is a long-term (chronic) condition that causes muscle tenderness, tiredness (fatigue), and headache. Autoimmune or rheumatologic diseases. These are conditions, such as lupus, that cause the body's defense system (immune system) to attack areas in the body. Certain medicines. These include ACE inhibitors and statins. What are the signs or symptoms? The main symptom is sore or painful muscles. Your muscles may be sore when you do activities and when you stretch. You may also have slight swelling. How is this diagnosed? Muscle pain is diagnosed with a physical exam. Your health care provider will ask questions about your pain and when it began. If you have not had muscle pain for very long, your provider may want to wait before doing much testing. If your muscle pain has lasted a long time, tests may be done right away. In some cases, you may need tests to rule out other conditions and diseases. How is this treated? Treatment for muscle pain depends on the cause. Home care is usually enough to relieve the pain. Your  provider may also prescribe NSAIDs, such as ibuprofen. Follow these instructions at home: Medicines Take over-the-counter and prescription medicines only as told by your provider. Ask your health care provider if the medicine prescribed to you requires you to avoid driving or using machinery. Managing pain, swelling, and discomfort     If told, put ice on the painful area for the first 2 days of soreness. Put ice in a plastic bag. Place a towel between your skin and the bag. Leave the ice on for 20 minutes, 2-3 times a day. If your skin turns bright red, remove the ice right away to prevent skin damage. The risk of damage is higher if you cannot feel pain, heat, or cold. For the first 2 days of muscle soreness, or if there is swelling: Do not soak in hot baths. Do not use a hot tub, steam room, sauna, heating pad, or other heat source. After 2-3 days, you may switch between putting ice and heat on the area. If told, apply heat to the affected area as often as told by your provider. Use the heat source that your recommends, such as a moist heat pack or a heating pad. Place a towel between your skin and the heat source. Leave the heat on for 20-30 minutes. If your skin turns bright red, remove the ice or heat right away to prevent skin damage. The risk of damage is higher if you cannot feel pain, heat, or cold. If you have an injury,  raise (elevate) the injured area above the level of your heart while you are sitting or lying down. Activity  If your muscle pain is caused by overuse: Slow down your activities until the pain goes away. Do regular, gentle exercises if you are not normally active. Warm up before you exercise. Stretch before and after you exercise. This can help lower the risk of muscle pain. Do not keep working out if the pain is severe. Severe pain could mean that you have injured a muscle. You may have to avoid lifting. Ask your provider how much you can safely lift. Return  to your normal activities as told by your provider. Ask your provider what activities are safe for you. General instructions Do not use any products that contain nicotine or tobacco. These products include cigarettes, chewing tobacco, and vaping devices, such as e-cigarettes. If you need help quitting, ask your provider. Contact a health care provider if: Your muscle pain gets worse, and medicines do not help. The muscle pain lasts longer than 3 days. You have a rash or fever. You have muscle pain after a tick bite. You have muscle pain when you work out, even though you are in good shape. You have redness, soreness, or swelling. You have muscle pain after you start a new medicine or change the dose of a medicine. Get help right away if: You have trouble breathing. You have trouble swallowing. You have muscle pain along with a stiff neck, fever, and vomiting. You have severe muscle weakness, or you cannot move part of your body. You are urinating less, or you have dark, bloody, or discolored urine. You have redness or swelling at the site of the muscle pain. These symptoms may be an emergency. Get help right away. Call 911. Do not wait to see if the symptoms will go away. Do not drive yourself to the hospital. This information is not intended to replace advice given to you by your health care provider. Make sure you discuss any questions you have with your health care provider. Document Revised: 01/04/2022 Document Reviewed: 01/04/2022 Elsevier Patient Education  2024 [employer].

## 2024-04-08 ENCOUNTER — Ambulatory Visit
Admission: RE | Admit: 2024-04-08 | Discharge: 2024-04-08 | Disposition: A | Source: Ambulatory Visit | Attending: Nurse Practitioner | Admitting: Nurse Practitioner

## 2024-04-08 DIAGNOSIS — Z1231 Encounter for screening mammogram for malignant neoplasm of breast: Secondary | ICD-10-CM

## 2024-05-01 ENCOUNTER — Other Ambulatory Visit: Payer: Self-pay | Admitting: Nurse Practitioner

## 2024-05-01 DIAGNOSIS — E119 Type 2 diabetes mellitus without complications: Secondary | ICD-10-CM

## 2024-05-03 ENCOUNTER — Other Ambulatory Visit: Payer: Self-pay | Admitting: Nurse Practitioner

## 2024-05-03 MED ORDER — SEMAGLUTIDE (1 MG/DOSE) 4 MG/3ML ~~LOC~~ SOPN: 1.0000 mg | PEN_INJECTOR | SUBCUTANEOUS | 2 refills | Status: AC

## 2024-05-03 NOTE — Telephone Encounter (Unsigned)
 Copied from CRM #[id number]. Topic: Clinical - Prescription Issue >> May 03, 2024  2:30 PM [name] wrote: Reason for CRM: Patient is having issues filling her Ozempic  - states pharmacy is telling her that it is denied. Do not see listed in her med chart - please advise. Needing filled ASAP, seen back in October of this year # [phone]  [facility] [place], [place] - [address]

## 2024-05-03 NOTE — Progress Notes (Signed)
 Meds ordered this encounter  Medications   Semaglutide , 1 MG/DOSE, 4 MG/3ML SOPN    Sig: Inject 1 mg into the skin once a week.    Dispense:  3 mL    Refill:  2    Supervising Provider:   MARYANNE CHEW A [[id number]]   Mary-Margaret Gladis, FNP

## 2024-05-18 ENCOUNTER — Other Ambulatory Visit (HOSPITAL_COMMUNITY): Payer: Self-pay

## 2024-06-25 ENCOUNTER — Other Ambulatory Visit (INDEPENDENT_AMBULATORY_CARE_PROVIDER_SITE_OTHER)

## 2024-06-25 ENCOUNTER — Ambulatory Visit: Payer: Self-pay | Admitting: Nurse Practitioner

## 2024-06-25 ENCOUNTER — Other Ambulatory Visit: Payer: Self-pay | Admitting: Nurse Practitioner

## 2024-06-25 ENCOUNTER — Encounter: Payer: Self-pay | Admitting: Nurse Practitioner

## 2024-06-25 VITALS — BP 137/84 | HR 89 | Temp 97.0°F | Ht 68.0 in | Wt 178.0 lb

## 2024-06-25 DIAGNOSIS — G5603 Carpal tunnel syndrome, bilateral upper limbs: Secondary | ICD-10-CM | POA: Diagnosis not present

## 2024-06-25 DIAGNOSIS — Z13820 Encounter for screening for osteoporosis: Secondary | ICD-10-CM

## 2024-06-25 DIAGNOSIS — F5101 Primary insomnia: Secondary | ICD-10-CM | POA: Diagnosis not present

## 2024-06-25 DIAGNOSIS — Z6826 Body mass index (BMI) 26.0-26.9, adult: Secondary | ICD-10-CM | POA: Diagnosis not present

## 2024-06-25 DIAGNOSIS — E7800 Pure hypercholesterolemia, unspecified: Secondary | ICD-10-CM | POA: Diagnosis not present

## 2024-06-25 DIAGNOSIS — L989 Disorder of the skin and subcutaneous tissue, unspecified: Secondary | ICD-10-CM

## 2024-06-25 DIAGNOSIS — Z780 Asymptomatic menopausal state: Secondary | ICD-10-CM

## 2024-06-25 DIAGNOSIS — I10 Essential (primary) hypertension: Secondary | ICD-10-CM

## 2024-06-25 DIAGNOSIS — K219 Gastro-esophageal reflux disease without esophagitis: Secondary | ICD-10-CM

## 2024-06-25 DIAGNOSIS — M797 Fibromyalgia: Secondary | ICD-10-CM

## 2024-06-25 DIAGNOSIS — E119 Type 2 diabetes mellitus without complications: Secondary | ICD-10-CM

## 2024-06-25 DIAGNOSIS — F411 Generalized anxiety disorder: Secondary | ICD-10-CM | POA: Diagnosis not present

## 2024-06-25 LAB — LIPID PANEL

## 2024-06-25 LAB — BAYER DCA HB A1C WAIVED: HB A1C (BAYER DCA - WAIVED): 5.5 % (ref 4.8–5.6)

## 2024-06-25 MED ORDER — KETOROLAC TROMETHAMINE 60 MG/2ML IM SOLN
60.0000 mg | Freq: Once | INTRAMUSCULAR | Status: AC
  Administered 2024-06-25: 60 mg via INTRAMUSCULAR

## 2024-06-25 MED ORDER — HYDROCODONE-ACETAMINOPHEN 10-325 MG PO TABS: 1.0000 | ORAL_TABLET | Freq: Three times a day (TID) | ORAL | 0 refills | Status: AC | PRN

## 2024-06-25 MED ORDER — DULOXETINE HCL 60 MG PO CPEP: 60.0000 mg | ORAL_CAPSULE | Freq: Every day | ORAL | 1 refills | Status: AC

## 2024-06-25 NOTE — Patient Instructions (Signed)
Myofascial Pain Syndrome and Fibromyalgia Myofascial pain syndrome and fibromyalgia are both pain disorders. You may feel this pain mainly in your muscles. Myofascial pain syndrome: Always has tender points in the muscles that will cause pain when pressed (trigger points). The pain may come and go. Usually affects your neck, upper back, and shoulder areas. The pain often moves into your arms and hands. Fibromyalgia: Has muscle pains and tenderness that come and go. Is often associated with tiredness (fatigue) and sleep problems. Has trigger points. Tends to be long-lasting (chronic), but is not life-threatening. Fibromyalgia and myofascial pain syndrome are not the same. However, they often occur together. If you have both conditions, each can make the other worse. Both are common and can cause enough pain and fatigue to make day-to-day activities difficult. Both can be hard to diagnose because their symptoms are common in many other conditions. What are the causes? The exact causes of these conditions are not known. What increases the risk? You are more likely to develop either of these conditions if: You have a family history of the condition. You are female. You have certain triggers, such as: Spine disorders. An injury (trauma) or other physical stressors. Being under a lot of stress. Medical conditions such as osteoarthritis, rheumatoid arthritis, or lupus. What are the signs or symptoms? Fibromyalgia The main symptom of fibromyalgia is widespread pain and tenderness in your muscles. Pain is sometimes described as stabbing, shooting, or burning. You may also have: Tingling or numbness. Sleep problems and fatigue. Problems with attention and concentration (fibro fog). Other symptoms may include: Bowel and bladder problems. Headaches. Vision problems. Sensitivity to odors and noises. Depression or mood changes. Painful menstrual periods (dysmenorrhea). Dry skin or eyes. These  symptoms can vary over time. Myofascial pain syndrome Symptoms of myofascial pain syndrome include: Tight, ropy bands of muscle. Uncomfortable sensations in muscle areas. These may include aching, cramping, burning, numbness, tingling, and weakness. Difficulty moving certain parts of the body freely (poor range of motion). How is this diagnosed? This condition may be diagnosed by your symptoms and medical history. You will also have a physical exam. In general: Fibromyalgia is diagnosed if you have pain, fatigue, and other symptoms for more than 3 months, and symptoms cannot be explained by another condition. Myofascial pain syndrome is diagnosed if you have trigger points in your muscles, and those trigger points are tender and cause pain elsewhere in your body (referred pain). How is this treated? Treatment for these conditions depends on the type that you have. For fibromyalgia, a healthy lifestyle is the most important treatment including aerobic and strength exercises. Different types of medicines are used to help treat pain and include: NSAIDs. Medicines for treating depression. Medicines that help control seizures. Medicines that relax the muscles. Treatment for myofascial pain syndrome includes: Pain medicines, such as NSAIDs. Cooling and stretching of muscles. Massage therapy with myofascial release technique. Trigger point injections. Treating these conditions often requires a team of health care providers. These may include: Your primary care provider. A physical therapist. Complementary health care providers, such as massage therapists or acupuncturists. A psychiatrist for cognitive behavioral therapy. Follow these instructions at home: Medicines Take over-the-counter and prescription medicines only as told by your health care provider. Ask your health care provider if the medicine prescribed to you: Requires you to avoid driving or using machinery. Can cause constipation.  You may need to take these actions to prevent or treat constipation: Drink enough fluid to keep your urine pale   yellow. Take over-the-counter or prescription medicines. Eat foods that are high in fiber, such as beans, whole grains, and fresh fruits and vegetables. Limit foods that are high in fat and processed sugars, such as fried or sweet foods. Lifestyle  Do exercises as told by your health care provider or physical therapist. Practice relaxation techniques to control your stress. You may want to try: Biofeedback. Visual imagery. Hypnosis. Muscle relaxation. Yoga. Meditation. Maintain a healthy lifestyle. This includes eating a healthy diet and getting enough sleep. Do not use any products that contain nicotine or tobacco. These products include cigarettes, chewing tobacco, and vaping devices, such as e-cigarettes. If you need help quitting, ask your health care provider. General instructions Talk to your health care provider about complementary treatments, such as acupuncture or massage. Do not do activities that stress or strain your muscles. This includes repetitive motions and heavy lifting. Keep all follow-up visits. This is important. Where to find support Consider joining a support group with others who are diagnosed with this condition. National Fibromyalgia Association: fmaware.org Where to find more information [place] Pain Foundation: uspainfoundation.org Contact a health care provider if: You have new symptoms. Your symptoms get worse or your pain is severe. You have side effects from your medicines. You have trouble sleeping. Your condition is causing depression or anxiety. Get help right away if: You have thoughts of hurting yourself or others. Get help right away if you feel like you may hurt yourself or others, or have thoughts about taking your own life. Go to your nearest emergency room or: Call 911. Call the National Suicide Prevention Lifeline at 1-800-273-8255  or 988. This is open 24 hours a day. Text the Crisis Text Line at 741741. This information is not intended to replace advice given to you by your health care provider. Make sure you discuss any questions you have with your health care provider. Document Revised: 03/04/2022 Document Reviewed: 04/27/2021 Elsevier Patient Education  2024 [employer]vier Inc.  

## 2024-06-25 NOTE — Progress Notes (Addendum)
 "  Subjective:    Patient ID: [name], female    DOB: Oct 14, 1960, 64 y.o.   MRN: [id number]   Chief Complaint: medical management of chronic issues      HPI:  [name] is a 64 y.o. who identifies as a female who was assigned female at birth.   Social history: Lives with: by herself Work history: works for a [employer] in today for follow up of the following chronic medical issues:  1. Primary hypertension No c/o chest pain, sob or headache. Does not check blood pressure at home. BP Readings from Last 3 Encounters:  03/29/24 137/78  01/05/24 129/81  11/06/23 (!) 146/83     2. Pure hypercholesterolemia Does try to watch diet and stay active. Does no dedicated exercise. Lab Results  Component Value Date   CHOL 120 01/05/2024   HDL 39 (L) 01/05/2024   LDLCALC 54 01/05/2024   TRIG 155 (H) 01/05/2024   CHOLHDL 3.1 01/05/2024  The ASCVD Risk score (Arnett DK, et al., 2019) failed to calculate for the following reasons:   The valid total cholesterol range is 130 to 320 mg/dL    3. Gastroesophageal reflux disease, unspecified whether esophagitis present Is on protonix  and is doing well  4. Diet controlled diabetes Has been watching diet since dx. Blood sugars are running around 140-160. She watches diet and weight will not go down.   5. Fibromyalgia Takes savella  and pain meds daily. Savella  is become to expensive. Wants to try something else.  Pain assessment: Cause of pain- fibromyalgia Pain location- varies from day to day Pain on scale of 1-10- 10/10 today Frequency- daily What increases pain-nothing really What makes pain Better-rest Effects on ADL - none Any change in general medical condition-none  Current opioids rx- norco 10/325 TID prn # meds rx- 90 Effectiveness of current meds-helps Adverse reactions from pain meds-none Morphine  equivalent- 30 MME  Pill count performed-No Last drug screen - 01/02/23 ( high risk q26m, moderate risk  q72m, low risk yearly ) Urine drug screen today- No Was the [facility] reviewed- yes  If yes were their any concerning findings? - no   Overdose risk: 1    08/12/2019    9:46 AM  Opioid Risk   Alcohol 0   Illegal Drugs 0  Rx Drugs 0  Alcohol 0  Illegal Drugs 0  Rx Drugs 0  Age between 16-45 years  0  History of Preadolescent Sexual Abuse 0  Psychological Disease 0  Depression 0  Opioid Risk Tool Scoring 0  Opioid Risk Interpretation Low Risk     Data saved with a previous flowsheet row definition     Pain contract signed on:01/05/24   6. GAD (generalized anxiety disorder) On no meds and is doing well    06/25/2024    2:29 PM 03/29/2024    2:13 PM 09/29/2023    3:57 PM 09/24/2022    8:11 AM  GAD 7 : Generalized Anxiety Score  Nervous, Anxious, on Edge 0 0 0 0  Control/stop worrying 0 0 0 0  Worry too much - different things 0 0 0 0  Trouble relaxing 0 0 0 0  Restless 0 0 0 0  Easily annoyed or irritable 0 0 0 1  Afraid - awful might happen 0 0 0 0  Total GAD 7 Score 0 0 0 1  Anxiety Difficulty Not difficult at all Not difficult at all Not difficult at all Not difficult at  all    7. Primary insomnia On no sleep aids  8. BMI 26.0-26.9,adult No recent weight changes  Wt Readings from Last 3 Encounters:  06/25/24 178 lb (80.7 kg)  03/29/24 175 lb (79.4 kg)  01/05/24 178 lb (80.7 kg)   BMI Readings from Last 3 Encounters:  06/25/24 27.06 kg/m  03/29/24 26.61 kg/m  01/05/24 27.06 kg/m       New complaints: - carpal tunnel is worsening- needs referral to see ENT - skin lesions- wants to see dermatology  Allergies  Allergen Reactions   Other Anaphylaxis and Itching    Plastic bottles; patient drank from a water  bottle and had to use Epi pen  Yeast infection from antibiotics   Peanuts [Peanut Oil] Anaphylaxis   Statins Other (See Comments)    Severe joint pain   Tape Other (See Comments)    Causes red blisters on skin. Can use paper tape    Outpatient Encounter Medications as of 06/25/2024  Medication Sig   EPINEPHrine  0.3 mg/0.3 mL IJ SOAJ injection Inject 0.3 mLs (0.3 mg total) into the muscle once.   fluticasone  (FLONASE ) 50 MCG/ACT nasal spray Place 2 sprays into both nostrils daily.   HYDROcodone -acetaminophen  (NORCO) 10-325 MG tablet Take 1 tablet by mouth 3 (three) times daily as needed.   ketoconazole  (NIZORAL ) 2 % cream Apply 1 Application topically daily.   meloxicam  (MOBIC ) 7.5 MG tablet TAKE ONE TABLET TWICE DAILY FOR 14 DAYS, THEN AS NEEDED   Milnacipran  (SAVELLA ) 50 MG TABS tablet Take 1 tablet (50 mg total) by mouth 2 (two) times daily.   Multiple Vitamin (MULTIVITAMIN) tablet Take 1 tablet by mouth daily.   ondansetron  (ZOFRAN  ODT) 4 MG disintegrating tablet Take 1 tablet (4 mg total) by mouth every 8 (eight) hours as needed for nausea or vomiting.   pantoprazole  (PROTONIX ) 40 MG tablet Take 1 tablet (40 mg total) by mouth 2 (two) times daily.   pravastatin  (PRAVACHOL ) 40 MG tablet Take 1 tablet (40 mg total) by mouth daily.   Semaglutide , 1 MG/DOSE, 4 MG/3ML SOPN Inject 1 mg into the skin once a week.   sucralfate  (CARAFATE ) 1 g tablet Take 1 tablet (1 g total) by mouth 2 (two) times daily for 14 days.   SUMAtriptan  (IMITREX ) 100 MG tablet TAKE ONE TABLET AT ONSET OF MIGRAINE. MAY REPEAT ONCE IN 24 HOURS.   tiZANidine  (ZANAFLEX ) 4 MG tablet TAKE ONE TABLET AT BEDTIME AS NEEDED FOR MUSCLE SPASMS   [DISCONTINUED] predniSONE  (DELTASONE ) 20 MG tablet 2 po at sametime daily for 5 days-   [DISCONTINUED] Torsemide  40 MG TABS Take 1 tablet by mouth once for 1 dose.   No facility-administered encounter medications on file as of 06/25/2024.    Past Surgical History:  Procedure Laterality Date   ABDOMINAL HYSTERECTOMY     ADNOIDS     ANTERIOR CERVICAL DECOMP/DISCECTOMY FUSION N/A 10/27/2013   Procedure: ACDF/ANTERIOR CERVICAL DECOMPRESSION/DISCECTOMY FUSION  C5-C7  (2 LEVELS);  Surgeon: Donaciano Sprang, MD;  Location:  [facility] OR;  Service: Orthopedics;  Laterality: N/A;   BACK SURGERY     spinal   COLONOSCOPY  05/26/2020   EYE SURGERY Bilateral    cataract removal   KNEE ARTHROSCOPY Right    TOTAL HIP ARTHROPLASTY Left 03/28/2022   Procedure: TOTAL HIP ARTHROPLASTY ANTERIOR APPROACH;  Surgeon: Ernie Cough, MD;  Location: [facility] ORS;  Service: Orthopedics;  Laterality: Left;   TOTAL KNEE ARTHROPLASTY Right 06/30/2020   Procedure: TOTAL KNEE ARTHROPLASTY;  Surgeon: Kay Kemps, MD;  Location: [facility] OR[facility];  Service: Orthopedics;  Laterality: Right;   UPPER GASTROINTESTINAL ENDOSCOPY  05/26/2020    Family History  Problem Relation Age of Onset   Heart disease Mother    Cancer Father        STOMACH CANCER   Stomach cancer Father    Cancer Maternal Grandfather    Diabetes Paternal Grandmother    Rectal cancer Neg Hx    Esophageal cancer Neg Hx    Colon cancer Neg Hx    Breast cancer Neg Hx       Controlled substance contract: n/a     Review of Systems  Constitutional:  Negative for diaphoresis.  Eyes:  Negative for pain.  Respiratory:  Negative for shortness of breath.   Cardiovascular:  Negative for chest pain, palpitations and leg swelling.  Gastrointestinal:  Negative for abdominal pain.  Endocrine: Negative for polydipsia.  Skin:  Negative for rash.  Neurological:  Negative for dizziness, weakness and headaches.  Hematological:  Does not bruise/bleed easily.  All other systems reviewed and are negative.      Objective:   Physical Exam Vitals and nursing note reviewed.  Constitutional:      General: She is not in acute distress.    Appearance: Normal appearance. She is well-developed.  HENT:     Head: Normocephalic.     Right Ear: Tympanic membrane normal.     Left Ear: Tympanic membrane normal.     Nose: Nose normal.     Mouth/Throat:     Mouth: Mucous membranes are moist.  Eyes:     Pupils: Pupils are equal, round, and reactive to light.  Neck:     Vascular: No carotid bruit  or JVD.  Cardiovascular:     Rate and Rhythm: Normal rate and regular rhythm.     Heart sounds: Normal heart sounds.  Pulmonary:     Effort: Pulmonary effort is normal. No respiratory distress.     Breath sounds: Normal breath sounds. No wheezing or rales.  Chest:     Chest wall: No tenderness.  Abdominal:     General: Bowel sounds are normal. There is no distension or abdominal bruit.     Palpations: Abdomen is soft. There is no hepatomegaly, splenomegaly, mass or pulsatile mass.     Tenderness: There is no abdominal tenderness.  Musculoskeletal:        General: Normal range of motion.     Cervical back: Normal range of motion and neck supple.  Lymphadenopathy:     Cervical: No cervical adenopathy.  Skin:    General: Skin is warm and dry.  Neurological:     Mental Status: She is alert and oriented to person, place, and time.     Deep Tendon Reflexes: Reflexes are normal and symmetric.  Psychiatric:        Behavior: Behavior normal.        Thought Content: Thought content normal.        Judgment: Judgment normal.    BP 137/84   Pulse 89   Temp (!) 97 F (36.1 C) (Temporal)   Ht 5' 8 (1.727 m)   Wt 178 lb (80.7 kg)   SpO2 97%   BMI 27.06 kg/m    Hgba1c 5.9%      Assessment & Plan:  LAM BJORKLUND comes in today with chief complaint of medical management of chronic issues    Diagnosis and orders addressed:  1. Primary hypertension Low sodium diet - CBC with Differential/Platelet -  CMP14+EGFR  2. Pure hypercholesterolemia Low fat diet - Lipid panel - pravastatin  (PRAVACHOL ) 40 MG tablet; Take 1 tablet (40 mg total) by mouth daily.  Dispense: 90 tablet; Refill: 1  3. Gastroesophageal reflux disease, unspecified whether esophagitis present Avoid spicy foods Do not eat 2 hours prior to bedtime - pantoprazole  (PROTONIX ) 40 MG tablet; Take 1 tablet (40 mg total) by mouth 2 (two) times daily.  Dispense: 180 tablet; Refill: 1  4. Diabetes mellitus with injecton  of noninsuin medication -increased to ozempic  1mg  weekly   5. Fibromyalgia Keep legs warm Changed savella  to cymbalta  -cymbalta  60mg  1 po daily #90 1 refill - methylPREDNISolone  acetate (DEPO-MEDROL ) injection 80 mg - ketorolac  (TORADOL ) injection 60 mg - tiZANidine  (ZANAFLEX ) 4 MG tablet; TAKE ONE TABLET AT BEDTIME AS NEEDED FOR MUSCLE SPASMS  Dispense: 90 tablet; Refill: 1  6. GAD (generalized anxiety disorder) Stress management  7. Primary insomnia Bedtime   8. BMI 26.0-26.9,adult Discussed diet and exercise for person with BMI >25 Will recheck weight in 3-6 months   9. Peripheral edema Elevate legs when sitting - Torsemide  40 MG TABS; Take 1 tablet by mouth once for 1 dose.  Dispense: 90 tablet; Refill: 1  10. Skin lesion Referral to derm  11. Carpal tunnel syndrome Referral to dr. Court  Labs pending Health Maintenance reviewed Diet and exercise encouraged  Follow up plan: 6 months   Mary-Margaret Gladis, FNP   "

## 2024-06-26 LAB — CMP14+EGFR
ALT: 31 IU/L (ref 0–32)
AST: 28 IU/L (ref 0–40)
Albumin: 4.5 g/dL (ref 3.9–4.9)
Alkaline Phosphatase: 83 IU/L (ref 49–135)
BUN/Creatinine Ratio: 21 (ref 12–28)
BUN: 17 mg/dL (ref 8–27)
Bilirubin Total: 0.2 mg/dL (ref 0.0–1.2)
CO2: 22 mmol/L (ref 20–29)
Calcium: 9.5 mg/dL (ref 8.7–10.3)
Chloride: 102 mmol/L (ref 96–106)
Creatinine, Ser: 0.81 mg/dL (ref 0.57–1.00)
Globulin, Total: 2.7 g/dL (ref 1.5–4.5)
Glucose: 139 mg/dL — ABNORMAL HIGH (ref 70–99)
Potassium: 4.1 mmol/L (ref 3.5–5.2)
Sodium: 140 mmol/L (ref 134–144)
Total Protein: 7.2 g/dL (ref 6.0–8.5)
eGFR: 81 mL/min/1.73

## 2024-06-26 LAB — CBC WITH DIFFERENTIAL/PLATELET
Basophils Absolute: 0 x10E3/uL (ref 0.0–0.2)
Basos: 0 %
EOS (ABSOLUTE): 0.1 x10E3/uL (ref 0.0–0.4)
Eos: 1 %
Hematocrit: 43.9 % (ref 34.0–46.6)
Hemoglobin: 14.3 g/dL (ref 11.1–15.9)
Immature Grans (Abs): 0 x10E3/uL (ref 0.0–0.1)
Immature Granulocytes: 0 %
Lymphocytes Absolute: 2.4 x10E3/uL (ref 0.7–3.1)
Lymphs: 37 %
MCH: 29.6 pg (ref 26.6–33.0)
MCHC: 32.6 g/dL (ref 31.5–35.7)
MCV: 91 fL (ref 79–97)
Monocytes Absolute: 0.5 x10E3/uL (ref 0.1–0.9)
Monocytes: 8 %
Neutrophils Absolute: 3.4 x10E3/uL (ref 1.4–7.0)
Neutrophils: 54 %
Platelets: 182 x10E3/uL (ref 150–450)
RBC: 4.83 x10E6/uL (ref 3.77–5.28)
RDW: 12.9 % (ref 11.7–15.4)
WBC: 6.4 x10E3/uL (ref 3.4–10.8)

## 2024-06-26 LAB — LIPID PANEL
Chol/HDL Ratio: 2.8 ratio (ref 0.0–4.4)
Cholesterol, Total: 130 mg/dL (ref 100–199)
HDL: 47 mg/dL
LDL Chol Calc (NIH): 59 mg/dL (ref 0–99)
Triglycerides: 136 mg/dL (ref 0–149)
VLDL Cholesterol Cal: 24 mg/dL (ref 5–40)

## 2024-06-28 ENCOUNTER — Ambulatory Visit: Payer: Self-pay | Admitting: Nurse Practitioner

## 2024-06-28 MED ORDER — OSELTAMIVIR PHOSPHATE 75 MG PO CAPS: 75.0000 mg | ORAL_CAPSULE | Freq: Two times a day (BID) | ORAL | 0 refills | Status: AC

## 2024-09-16 ENCOUNTER — Ambulatory Visit: Admitting: Nurse Practitioner
